# Patient Record
Sex: Female | Born: 1945
Health system: Southern US, Community
[De-identification: ages and names within clinical notes are randomized; demographics above are authoritative.]

## PROBLEM LIST (undated history)

## (undated) DIAGNOSIS — E7800 Pure hypercholesterolemia, unspecified: Secondary | ICD-10-CM

## (undated) DIAGNOSIS — E1122 Type 2 diabetes mellitus with diabetic chronic kidney disease: Secondary | ICD-10-CM

## (undated) DIAGNOSIS — N183 Chronic kidney disease, stage 3 unspecified: Secondary | ICD-10-CM

## (undated) DIAGNOSIS — M1990 Unspecified osteoarthritis, unspecified site: Secondary | ICD-10-CM

## (undated) DIAGNOSIS — K219 Gastro-esophageal reflux disease without esophagitis: Secondary | ICD-10-CM

## (undated) DIAGNOSIS — E119 Type 2 diabetes mellitus without complications: Secondary | ICD-10-CM

## (undated) DIAGNOSIS — I10 Essential (primary) hypertension: Secondary | ICD-10-CM

## (undated) HISTORY — DX: Type 2 diabetes mellitus without complications: E11.9

## (undated) HISTORY — DX: Essential (primary) hypertension: I10

## (undated) HISTORY — DX: Chronic kidney disease, stage 3 (moderate): N18.3

## (undated) HISTORY — DX: Pure hypercholesterolemia, unspecified: E78.00

## (undated) HISTORY — PX: EYE SURGERY: SHX253

## (undated) HISTORY — DX: Chronic kidney disease, stage 3 unspecified: N18.30

## (undated) HISTORY — DX: Type 2 diabetes mellitus with diabetic chronic kidney disease: E11.22

---

## 2003-12-19 ENCOUNTER — Encounter: Admission: RE | Admit: 2003-12-19 | Discharge: 2003-12-19 | Payer: Self-pay | Admitting: Internal Medicine

## 2005-09-28 ENCOUNTER — Encounter: Admission: RE | Admit: 2005-09-28 | Discharge: 2005-09-28 | Payer: Self-pay | Admitting: Internal Medicine

## 2005-10-20 ENCOUNTER — Encounter: Admission: RE | Admit: 2005-10-20 | Discharge: 2005-10-20 | Payer: Self-pay | Admitting: Gastroenterology

## 2005-11-25 ENCOUNTER — Ambulatory Visit (HOSPITAL_COMMUNITY): Admission: RE | Admit: 2005-11-25 | Discharge: 2005-11-25 | Payer: Self-pay | Admitting: Gastroenterology

## 2007-03-19 ENCOUNTER — Encounter: Admission: RE | Admit: 2007-03-19 | Discharge: 2007-03-19 | Payer: Self-pay | Admitting: Internal Medicine

## 2007-03-30 ENCOUNTER — Encounter: Admission: RE | Admit: 2007-03-30 | Discharge: 2007-03-30 | Payer: Self-pay | Admitting: Internal Medicine

## 2008-11-11 ENCOUNTER — Emergency Department (HOSPITAL_COMMUNITY): Admission: EM | Admit: 2008-11-11 | Discharge: 2008-11-11 | Payer: Self-pay | Admitting: Emergency Medicine

## 2009-04-03 ENCOUNTER — Encounter: Admission: RE | Admit: 2009-04-03 | Discharge: 2009-04-03 | Payer: Self-pay | Admitting: Internal Medicine

## 2010-08-30 LAB — DIFFERENTIAL
Basophils Relative: 1 % (ref 0–1)
Eosinophils Absolute: 0.1 10*3/uL (ref 0.0–0.7)
Eosinophils Relative: 1 % (ref 0–5)
Lymphocytes Relative: 33 % (ref 12–46)
Lymphs Abs: 1.8 10*3/uL (ref 0.7–4.0)
Monocytes Relative: 8 % (ref 3–12)
Neutro Abs: 3.2 10*3/uL (ref 1.7–7.7)

## 2010-08-30 LAB — COMPREHENSIVE METABOLIC PANEL
AST: 30 U/L (ref 0–37)
Albumin: 3.7 g/dL (ref 3.5–5.2)
Calcium: 9.6 mg/dL (ref 8.4–10.5)
Chloride: 104 mEq/L (ref 96–112)
Creatinine, Ser: 0.73 mg/dL (ref 0.4–1.2)
GFR calc non Af Amer: >60 mL/min (ref >60)
Potassium: 4.1 mEq/L (ref 3.5–5.1)
Sodium: 139 mEq/L (ref 135–145)
Total Bilirubin: 0.8 mg/dL (ref 0.3–1.2)

## 2010-08-30 LAB — CBC
Hemoglobin: 11.7 g/dL — ABNORMAL LOW (ref 12.0–15.0)
MCHC: 33.6 g/dL (ref 30.0–36.0)
MCV: 84.2 fL (ref 78.0–100.0)
RDW: 16.1 % — ABNORMAL HIGH (ref 11.5–15.5)

## 2010-08-30 LAB — CK TOTAL AND CKMB (NOT AT ARMC): Total CK: 524 U/L — ABNORMAL HIGH (ref 7–177)

## 2010-08-30 LAB — D-DIMER, QUANTITATIVE: D-Dimer, Quant: 0.51 ug/mL-FEU — ABNORMAL HIGH (ref 0.00–0.48)

## 2010-10-08 NOTE — Op Note (Signed)
NAME:  Nicole Nunez, Nicole Nunez             ACCOUNT NO.:  [id number]   MEDICAL RECORD NO.:  [id number]          PATIENT TYPE:  AMB   LOCATION:  ENDO                         FACILITY:  [facility]   PHYSICIAN:  Anselmo Rod, M.D.  DATE OF BIRTH:  12-Jun-1945   DATE OF PROCEDURE:  11/25/2005  DATE OF DISCHARGE:                                 OPERATIVE REPORT   PROCEDURE:  Screening colonoscopy.   ENDOSCOPIST:  Anselmo Rod, M.D.   INSTRUMENT USED:  Olympus videocolonoscope.   INDICATIONS FOR PROCEDURE:  The patient is a 65 year old African American  female undergoing screening colonoscopy to rule out colonic polyps, masses,  etc.  The patient has a history of mild anemia and suffers from  constipation.   PREPROCEDURE PREPARATION:  Informed consent was procured from the patient.  The patient was fasted for four hours prior to the procedure and prepped  with OsmoPrep pill the night of and the morning of the procedure.  The risks  and benefits of the procedure, including a 10% missed rate of cancer and  polyps, were discussed with the patient as well.   PREPROCEDURE PHYSICAL EXAMINATION:  VITAL SIGNS:  Stable.  NECK:  Supple.  CHEST:  Clear to auscultation.  S1 and S2 regular.  ABDOMEN:  Soft with normal bowel sounds.   DESCRIPTION OF PROCEDURE:  The patient was placed in the left lateral  decubitus position and sedated with 100 mcg of Fentanyl and 10 mg of Versed  in slow incremental doses.  Once the patient was adequately sedated and  maintained on low-flow oxygen and continuous cardiac monitoring, the Olympus  videocolonoscope was advanced from the rectum to the cecum.  The appendiceal  orifice and ileocecal valve were clearly visualized and photographed.  The  terminal ileum appeared healthy and without lesions.  There was some  residual stool in the colon.  Multiple washings were done.  No masses,  polyps, erosions, ulcerations, or diverticula were seen.  Retroflexion in  the rectum  revealed no abnormalities.   IMPRESSION:  Normal colonoscopy to the terminal ileum.  No masses, polyps,  or diverticula seen.   RECOMMENDATIONS:  1.Continue a high fiber diet with liberal fluid intake.  2.Repeat colonoscopy in the next 10 years unless the patient develops any  abnormal symptoms in the interim.  3.Outpatient followup as the need arises in the future.      Anselmo Rod, M.D.  Electronically Signed     JNM/MEDQ  D:  11/25/2005  T:  11/25/2005  Job:  [id number]   cc:   Candyce Churn. Allyne Gee, M.D.  Fax: [phone]

## 2011-11-25 ENCOUNTER — Other Ambulatory Visit: Payer: Self-pay | Admitting: Internal Medicine

## 2011-11-25 ENCOUNTER — Ambulatory Visit
Admission: RE | Admit: 2011-11-25 | Discharge: 2011-11-25 | Disposition: A | Discharge status: Home / Self Care | Payer: 59 | Source: Ambulatory Visit | Attending: Internal Medicine | Admitting: Internal Medicine

## 2011-11-25 DIAGNOSIS — R52 Pain, unspecified: Secondary | ICD-10-CM

## 2013-08-12 ENCOUNTER — Other Ambulatory Visit: Payer: Self-pay

## 2013-08-12 DIAGNOSIS — Z1231 Encounter for screening mammogram for malignant neoplasm of breast: Secondary | ICD-10-CM

## 2013-08-20 ENCOUNTER — Other Ambulatory Visit: Payer: Self-pay

## 2013-08-20 ENCOUNTER — Other Ambulatory Visit: Payer: Self-pay | Admitting: Internal Medicine

## 2013-08-20 DIAGNOSIS — N644 Mastodynia: Secondary | ICD-10-CM

## 2013-08-21 ENCOUNTER — Ambulatory Visit: Payer: 59

## 2013-08-27 ENCOUNTER — Ambulatory Visit
Admission: RE | Admit: 2013-08-27 | Discharge: 2013-08-27 | Disposition: A | Discharge status: Home / Self Care | Payer: 59 | Source: Ambulatory Visit

## 2013-08-27 ENCOUNTER — Ambulatory Visit
Admission: RE | Admit: 2013-08-27 | Discharge: 2013-08-27 | Disposition: A | Discharge status: Home / Self Care | Payer: Self-pay | Source: Ambulatory Visit

## 2013-08-27 DIAGNOSIS — N644 Mastodynia: Secondary | ICD-10-CM

## 2014-07-21 ENCOUNTER — Other Ambulatory Visit: Payer: Self-pay

## 2014-07-21 DIAGNOSIS — Z1231 Encounter for screening mammogram for malignant neoplasm of breast: Secondary | ICD-10-CM

## 2014-09-01 ENCOUNTER — Ambulatory Visit
Admission: RE | Admit: 2014-09-01 | Discharge: 2014-09-01 | Disposition: A | Discharge status: Home / Self Care | Payer: Medicare Other | Source: Ambulatory Visit

## 2014-09-01 DIAGNOSIS — Z1231 Encounter for screening mammogram for malignant neoplasm of breast: Secondary | ICD-10-CM

## 2014-10-06 ENCOUNTER — Other Ambulatory Visit (HOSPITAL_COMMUNITY): Payer: Self-pay | Admitting: Internal Medicine

## 2014-10-06 DIAGNOSIS — R1314 Dysphagia, pharyngoesophageal phase: Secondary | ICD-10-CM

## 2014-10-09 ENCOUNTER — Ambulatory Visit (HOSPITAL_COMMUNITY): Admission: RE | Admit: 2014-10-09 | Payer: Medicare Other | Source: Ambulatory Visit

## 2014-12-12 ENCOUNTER — Other Ambulatory Visit: Payer: Self-pay | Admitting: Internal Medicine

## 2014-12-12 DIAGNOSIS — E049 Nontoxic goiter, unspecified: Secondary | ICD-10-CM

## 2014-12-19 ENCOUNTER — Ambulatory Visit
Admission: RE | Admit: 2014-12-19 | Discharge: 2014-12-19 | Disposition: A | Discharge status: Home / Self Care | Payer: Medicare Other | Source: Ambulatory Visit | Attending: Internal Medicine | Admitting: Internal Medicine

## 2014-12-19 DIAGNOSIS — E049 Nontoxic goiter, unspecified: Secondary | ICD-10-CM

## 2015-06-24 DIAGNOSIS — N08 Glomerular disorders in diseases classified elsewhere: Secondary | ICD-10-CM | POA: Diagnosis not present

## 2015-06-24 DIAGNOSIS — E1122 Type 2 diabetes mellitus with diabetic chronic kidney disease: Secondary | ICD-10-CM | POA: Diagnosis not present

## 2015-06-24 DIAGNOSIS — I129 Hypertensive chronic kidney disease with stage 1 through stage 4 chronic kidney disease, or unspecified chronic kidney disease: Secondary | ICD-10-CM | POA: Diagnosis not present

## 2015-06-24 DIAGNOSIS — N182 Chronic kidney disease, stage 2 (mild): Secondary | ICD-10-CM | POA: Diagnosis not present

## 2015-07-29 DIAGNOSIS — L602 Onychogryphosis: Secondary | ICD-10-CM | POA: Diagnosis not present

## 2015-07-29 DIAGNOSIS — E1351 Other specified diabetes mellitus with diabetic peripheral angiopathy without gangrene: Secondary | ICD-10-CM | POA: Diagnosis not present

## 2015-10-20 ENCOUNTER — Other Ambulatory Visit: Payer: Self-pay

## 2015-10-20 DIAGNOSIS — Z1231 Encounter for screening mammogram for malignant neoplasm of breast: Secondary | ICD-10-CM

## 2015-10-22 ENCOUNTER — Ambulatory Visit
Admission: RE | Admit: 2015-10-22 | Discharge: 2015-10-22 | Disposition: A | Discharge status: Home / Self Care | Payer: Medicare Other | Source: Ambulatory Visit

## 2015-10-22 DIAGNOSIS — Z1231 Encounter for screening mammogram for malignant neoplasm of breast: Secondary | ICD-10-CM

## 2015-10-22 DIAGNOSIS — I129 Hypertensive chronic kidney disease with stage 1 through stage 4 chronic kidney disease, or unspecified chronic kidney disease: Secondary | ICD-10-CM | POA: Diagnosis not present

## 2015-10-22 DIAGNOSIS — D649 Anemia, unspecified: Secondary | ICD-10-CM | POA: Diagnosis not present

## 2015-10-22 DIAGNOSIS — Z0000 Encounter for general adult medical examination without abnormal findings: Secondary | ICD-10-CM | POA: Diagnosis not present

## 2015-10-22 DIAGNOSIS — N182 Chronic kidney disease, stage 2 (mild): Secondary | ICD-10-CM | POA: Diagnosis not present

## 2015-10-22 DIAGNOSIS — E1122 Type 2 diabetes mellitus with diabetic chronic kidney disease: Secondary | ICD-10-CM | POA: Diagnosis not present

## 2015-10-22 DIAGNOSIS — E559 Vitamin D deficiency, unspecified: Secondary | ICD-10-CM | POA: Diagnosis not present

## 2015-10-22 DIAGNOSIS — N08 Glomerular disorders in diseases classified elsewhere: Secondary | ICD-10-CM | POA: Diagnosis not present

## 2015-11-11 DIAGNOSIS — L84 Corns and callosities: Secondary | ICD-10-CM | POA: Diagnosis not present

## 2015-11-11 DIAGNOSIS — E1351 Other specified diabetes mellitus with diabetic peripheral angiopathy without gangrene: Secondary | ICD-10-CM | POA: Diagnosis not present

## 2015-11-11 DIAGNOSIS — L602 Onychogryphosis: Secondary | ICD-10-CM | POA: Diagnosis not present

## 2015-11-12 DIAGNOSIS — K219 Gastro-esophageal reflux disease without esophagitis: Secondary | ICD-10-CM | POA: Diagnosis not present

## 2015-11-12 DIAGNOSIS — Z1211 Encounter for screening for malignant neoplasm of colon: Secondary | ICD-10-CM | POA: Diagnosis not present

## 2015-11-16 DIAGNOSIS — Z79899 Other long term (current) drug therapy: Secondary | ICD-10-CM | POA: Diagnosis not present

## 2015-12-18 DIAGNOSIS — K635 Polyp of colon: Secondary | ICD-10-CM | POA: Diagnosis not present

## 2015-12-18 DIAGNOSIS — Z5181 Encounter for therapeutic drug level monitoring: Secondary | ICD-10-CM | POA: Diagnosis not present

## 2015-12-18 DIAGNOSIS — K5730 Diverticulosis of large intestine without perforation or abscess without bleeding: Secondary | ICD-10-CM | POA: Diagnosis not present

## 2015-12-18 DIAGNOSIS — Z1211 Encounter for screening for malignant neoplasm of colon: Secondary | ICD-10-CM | POA: Diagnosis not present

## 2015-12-18 DIAGNOSIS — K5140 Inflammatory polyps of colon without complications: Secondary | ICD-10-CM | POA: Diagnosis not present

## 2015-12-18 DIAGNOSIS — D127 Benign neoplasm of rectosigmoid junction: Secondary | ICD-10-CM | POA: Diagnosis not present

## 2015-12-18 LAB — HM COLONOSCOPY

## 2016-01-21 DIAGNOSIS — H2513 Age-related nuclear cataract, bilateral: Secondary | ICD-10-CM | POA: Diagnosis not present

## 2016-01-21 DIAGNOSIS — H43393 Other vitreous opacities, bilateral: Secondary | ICD-10-CM | POA: Diagnosis not present

## 2016-01-21 DIAGNOSIS — E113211 Type 2 diabetes mellitus with mild nonproliferative diabetic retinopathy with macular edema, right eye: Secondary | ICD-10-CM | POA: Diagnosis not present

## 2016-01-21 DIAGNOSIS — E113292 Type 2 diabetes mellitus with mild nonproliferative diabetic retinopathy without macular edema, left eye: Secondary | ICD-10-CM | POA: Diagnosis not present

## 2016-01-27 DIAGNOSIS — N183 Chronic kidney disease, stage 3 (moderate): Secondary | ICD-10-CM | POA: Diagnosis not present

## 2016-01-27 DIAGNOSIS — E1121 Type 2 diabetes mellitus with diabetic nephropathy: Secondary | ICD-10-CM | POA: Diagnosis not present

## 2016-01-27 DIAGNOSIS — I129 Hypertensive chronic kidney disease with stage 1 through stage 4 chronic kidney disease, or unspecified chronic kidney disease: Secondary | ICD-10-CM | POA: Diagnosis not present

## 2016-01-27 DIAGNOSIS — N182 Chronic kidney disease, stage 2 (mild): Secondary | ICD-10-CM | POA: Diagnosis not present

## 2016-01-27 DIAGNOSIS — M810 Age-related osteoporosis without current pathological fracture: Secondary | ICD-10-CM | POA: Diagnosis not present

## 2016-01-28 ENCOUNTER — Other Ambulatory Visit: Payer: Self-pay | Admitting: Nephrology

## 2016-01-28 DIAGNOSIS — N183 Chronic kidney disease, stage 3 unspecified: Secondary | ICD-10-CM

## 2016-02-01 ENCOUNTER — Other Ambulatory Visit: Payer: Medicare Other

## 2016-02-10 DIAGNOSIS — L84 Corns and callosities: Secondary | ICD-10-CM | POA: Diagnosis not present

## 2016-02-10 DIAGNOSIS — E1351 Other specified diabetes mellitus with diabetic peripheral angiopathy without gangrene: Secondary | ICD-10-CM | POA: Diagnosis not present

## 2016-02-10 DIAGNOSIS — L602 Onychogryphosis: Secondary | ICD-10-CM | POA: Diagnosis not present

## 2016-02-16 ENCOUNTER — Ambulatory Visit
Admission: RE | Admit: 2016-02-16 | Discharge: 2016-02-16 | Disposition: A | Discharge status: Home / Self Care | Payer: Medicare Other | Source: Ambulatory Visit | Attending: Nephrology | Admitting: Nephrology

## 2016-02-16 DIAGNOSIS — N183 Chronic kidney disease, stage 3 unspecified: Secondary | ICD-10-CM

## 2016-02-16 DIAGNOSIS — N189 Chronic kidney disease, unspecified: Secondary | ICD-10-CM | POA: Diagnosis not present

## 2016-02-29 DIAGNOSIS — N183 Chronic kidney disease, stage 3 (moderate): Secondary | ICD-10-CM | POA: Diagnosis not present

## 2016-02-29 DIAGNOSIS — Z23 Encounter for immunization: Secondary | ICD-10-CM | POA: Diagnosis not present

## 2016-02-29 DIAGNOSIS — N08 Glomerular disorders in diseases classified elsewhere: Secondary | ICD-10-CM | POA: Diagnosis not present

## 2016-02-29 DIAGNOSIS — E1122 Type 2 diabetes mellitus with diabetic chronic kidney disease: Secondary | ICD-10-CM | POA: Diagnosis not present

## 2016-02-29 DIAGNOSIS — I129 Hypertensive chronic kidney disease with stage 1 through stage 4 chronic kidney disease, or unspecified chronic kidney disease: Secondary | ICD-10-CM | POA: Diagnosis not present

## 2016-04-11 DIAGNOSIS — M810 Age-related osteoporosis without current pathological fracture: Secondary | ICD-10-CM | POA: Diagnosis not present

## 2016-05-11 DIAGNOSIS — L84 Corns and callosities: Secondary | ICD-10-CM | POA: Diagnosis not present

## 2016-05-11 DIAGNOSIS — E1151 Type 2 diabetes mellitus with diabetic peripheral angiopathy without gangrene: Secondary | ICD-10-CM | POA: Diagnosis not present

## 2016-05-11 DIAGNOSIS — L602 Onychogryphosis: Secondary | ICD-10-CM | POA: Diagnosis not present

## 2016-05-30 DIAGNOSIS — N08 Glomerular disorders in diseases classified elsewhere: Secondary | ICD-10-CM | POA: Diagnosis not present

## 2016-05-30 DIAGNOSIS — N183 Chronic kidney disease, stage 3 (moderate): Secondary | ICD-10-CM | POA: Diagnosis not present

## 2016-05-30 DIAGNOSIS — E1122 Type 2 diabetes mellitus with diabetic chronic kidney disease: Secondary | ICD-10-CM | POA: Diagnosis not present

## 2016-05-30 DIAGNOSIS — J320 Chronic maxillary sinusitis: Secondary | ICD-10-CM | POA: Diagnosis not present

## 2016-08-15 DIAGNOSIS — H25813 Combined forms of age-related cataract, bilateral: Secondary | ICD-10-CM | POA: Diagnosis not present

## 2016-08-15 DIAGNOSIS — H5203 Hypermetropia, bilateral: Secondary | ICD-10-CM | POA: Diagnosis not present

## 2016-08-15 DIAGNOSIS — H52203 Unspecified astigmatism, bilateral: Secondary | ICD-10-CM | POA: Diagnosis not present

## 2016-08-15 DIAGNOSIS — H524 Presbyopia: Secondary | ICD-10-CM | POA: Diagnosis not present

## 2016-08-24 DIAGNOSIS — E1122 Type 2 diabetes mellitus with diabetic chronic kidney disease: Secondary | ICD-10-CM | POA: Diagnosis not present

## 2016-08-24 DIAGNOSIS — N08 Glomerular disorders in diseases classified elsewhere: Secondary | ICD-10-CM | POA: Diagnosis not present

## 2016-08-24 DIAGNOSIS — N183 Chronic kidney disease, stage 3 (moderate): Secondary | ICD-10-CM | POA: Diagnosis not present

## 2016-08-24 DIAGNOSIS — R202 Paresthesia of skin: Secondary | ICD-10-CM | POA: Diagnosis not present

## 2016-08-24 DIAGNOSIS — I129 Hypertensive chronic kidney disease with stage 1 through stage 4 chronic kidney disease, or unspecified chronic kidney disease: Secondary | ICD-10-CM | POA: Diagnosis not present

## 2016-08-26 DIAGNOSIS — I70293 Other atherosclerosis of native arteries of extremities, bilateral legs: Secondary | ICD-10-CM | POA: Diagnosis not present

## 2016-08-26 DIAGNOSIS — L602 Onychogryphosis: Secondary | ICD-10-CM | POA: Diagnosis not present

## 2016-08-26 DIAGNOSIS — L84 Corns and callosities: Secondary | ICD-10-CM | POA: Diagnosis not present

## 2016-08-26 DIAGNOSIS — E1351 Other specified diabetes mellitus with diabetic peripheral angiopathy without gangrene: Secondary | ICD-10-CM | POA: Diagnosis not present

## 2016-10-13 DIAGNOSIS — H2513 Age-related nuclear cataract, bilateral: Secondary | ICD-10-CM | POA: Diagnosis not present

## 2016-10-13 DIAGNOSIS — H43393 Other vitreous opacities, bilateral: Secondary | ICD-10-CM | POA: Diagnosis not present

## 2016-10-13 DIAGNOSIS — E113292 Type 2 diabetes mellitus with mild nonproliferative diabetic retinopathy without macular edema, left eye: Secondary | ICD-10-CM | POA: Diagnosis not present

## 2016-10-13 DIAGNOSIS — E113211 Type 2 diabetes mellitus with mild nonproliferative diabetic retinopathy with macular edema, right eye: Secondary | ICD-10-CM | POA: Diagnosis not present

## 2016-12-06 ENCOUNTER — Other Ambulatory Visit: Payer: Self-pay | Admitting: Internal Medicine

## 2016-12-06 DIAGNOSIS — Z1231 Encounter for screening mammogram for malignant neoplasm of breast: Secondary | ICD-10-CM

## 2016-12-07 ENCOUNTER — Ambulatory Visit
Admission: RE | Admit: 2016-12-07 | Discharge: 2016-12-07 | Disposition: A | Discharge status: Home / Self Care | Payer: Medicare Other | Source: Ambulatory Visit | Attending: Internal Medicine | Admitting: Internal Medicine

## 2016-12-07 DIAGNOSIS — Z1231 Encounter for screening mammogram for malignant neoplasm of breast: Secondary | ICD-10-CM

## 2016-12-07 DIAGNOSIS — Z0000 Encounter for general adult medical examination without abnormal findings: Secondary | ICD-10-CM | POA: Diagnosis not present

## 2016-12-07 DIAGNOSIS — E1122 Type 2 diabetes mellitus with diabetic chronic kidney disease: Secondary | ICD-10-CM | POA: Diagnosis not present

## 2016-12-07 DIAGNOSIS — N08 Glomerular disorders in diseases classified elsewhere: Secondary | ICD-10-CM | POA: Diagnosis not present

## 2016-12-07 DIAGNOSIS — N183 Chronic kidney disease, stage 3 (moderate): Secondary | ICD-10-CM | POA: Diagnosis not present

## 2016-12-07 DIAGNOSIS — I129 Hypertensive chronic kidney disease with stage 1 through stage 4 chronic kidney disease, or unspecified chronic kidney disease: Secondary | ICD-10-CM | POA: Diagnosis not present

## 2016-12-07 DIAGNOSIS — E559 Vitamin D deficiency, unspecified: Secondary | ICD-10-CM | POA: Diagnosis not present

## 2016-12-09 DIAGNOSIS — L84 Corns and callosities: Secondary | ICD-10-CM | POA: Diagnosis not present

## 2016-12-09 DIAGNOSIS — E1351 Other specified diabetes mellitus with diabetic peripheral angiopathy without gangrene: Secondary | ICD-10-CM | POA: Diagnosis not present

## 2016-12-09 DIAGNOSIS — L602 Onychogryphosis: Secondary | ICD-10-CM | POA: Diagnosis not present

## 2017-02-20 DIAGNOSIS — E119 Type 2 diabetes mellitus without complications: Secondary | ICD-10-CM | POA: Diagnosis not present

## 2017-02-20 DIAGNOSIS — Z23 Encounter for immunization: Secondary | ICD-10-CM | POA: Diagnosis not present

## 2017-02-20 DIAGNOSIS — N183 Chronic kidney disease, stage 3 (moderate): Secondary | ICD-10-CM | POA: Diagnosis not present

## 2017-02-20 DIAGNOSIS — I129 Hypertensive chronic kidney disease with stage 1 through stage 4 chronic kidney disease, or unspecified chronic kidney disease: Secondary | ICD-10-CM | POA: Diagnosis not present

## 2017-03-16 DIAGNOSIS — L602 Onychogryphosis: Secondary | ICD-10-CM | POA: Diagnosis not present

## 2017-03-16 DIAGNOSIS — E1351 Other specified diabetes mellitus with diabetic peripheral angiopathy without gangrene: Secondary | ICD-10-CM | POA: Diagnosis not present

## 2017-03-16 DIAGNOSIS — L84 Corns and callosities: Secondary | ICD-10-CM | POA: Diagnosis not present

## 2017-04-12 DIAGNOSIS — E1122 Type 2 diabetes mellitus with diabetic chronic kidney disease: Secondary | ICD-10-CM | POA: Diagnosis not present

## 2017-04-12 DIAGNOSIS — N183 Chronic kidney disease, stage 3 (moderate): Secondary | ICD-10-CM | POA: Diagnosis not present

## 2017-04-12 DIAGNOSIS — I129 Hypertensive chronic kidney disease with stage 1 through stage 4 chronic kidney disease, or unspecified chronic kidney disease: Secondary | ICD-10-CM | POA: Diagnosis not present

## 2017-04-12 DIAGNOSIS — N08 Glomerular disorders in diseases classified elsewhere: Secondary | ICD-10-CM | POA: Diagnosis not present

## 2017-05-19 DIAGNOSIS — M2041 Other hammer toe(s) (acquired), right foot: Secondary | ICD-10-CM | POA: Diagnosis not present

## 2017-05-19 DIAGNOSIS — M2042 Other hammer toe(s) (acquired), left foot: Secondary | ICD-10-CM | POA: Diagnosis not present

## 2017-05-19 DIAGNOSIS — L84 Corns and callosities: Secondary | ICD-10-CM | POA: Diagnosis not present

## 2017-05-19 DIAGNOSIS — E119 Type 2 diabetes mellitus without complications: Secondary | ICD-10-CM | POA: Diagnosis not present

## 2017-06-19 DIAGNOSIS — L84 Corns and callosities: Secondary | ICD-10-CM | POA: Diagnosis not present

## 2017-06-19 DIAGNOSIS — M2041 Other hammer toe(s) (acquired), right foot: Secondary | ICD-10-CM | POA: Diagnosis not present

## 2017-06-19 DIAGNOSIS — B351 Tinea unguium: Secondary | ICD-10-CM | POA: Diagnosis not present

## 2017-06-19 DIAGNOSIS — M2042 Other hammer toe(s) (acquired), left foot: Secondary | ICD-10-CM | POA: Diagnosis not present

## 2017-06-19 DIAGNOSIS — E119 Type 2 diabetes mellitus without complications: Secondary | ICD-10-CM | POA: Diagnosis not present

## 2017-06-23 HISTORY — PX: OTHER SURGICAL HISTORY: SHX169

## 2017-07-05 DIAGNOSIS — B351 Tinea unguium: Secondary | ICD-10-CM | POA: Diagnosis not present

## 2017-07-05 DIAGNOSIS — M2041 Other hammer toe(s) (acquired), right foot: Secondary | ICD-10-CM | POA: Diagnosis not present

## 2017-07-05 DIAGNOSIS — M2042 Other hammer toe(s) (acquired), left foot: Secondary | ICD-10-CM | POA: Diagnosis not present

## 2017-07-07 DIAGNOSIS — Z4789 Encounter for other orthopedic aftercare: Secondary | ICD-10-CM | POA: Diagnosis not present

## 2017-07-07 DIAGNOSIS — Z4801 Encounter for change or removal of surgical wound dressing: Secondary | ICD-10-CM | POA: Diagnosis not present

## 2017-07-07 DIAGNOSIS — L84 Corns and callosities: Secondary | ICD-10-CM | POA: Diagnosis not present

## 2017-07-07 DIAGNOSIS — I10 Essential (primary) hypertension: Secondary | ICD-10-CM | POA: Diagnosis not present

## 2017-07-07 DIAGNOSIS — E119 Type 2 diabetes mellitus without complications: Secondary | ICD-10-CM | POA: Diagnosis not present

## 2017-07-07 DIAGNOSIS — B351 Tinea unguium: Secondary | ICD-10-CM | POA: Diagnosis not present

## 2017-07-11 DIAGNOSIS — Z4789 Encounter for other orthopedic aftercare: Secondary | ICD-10-CM | POA: Diagnosis not present

## 2017-07-11 DIAGNOSIS — L84 Corns and callosities: Secondary | ICD-10-CM | POA: Diagnosis not present

## 2017-07-11 DIAGNOSIS — B351 Tinea unguium: Secondary | ICD-10-CM | POA: Diagnosis not present

## 2017-07-11 DIAGNOSIS — I10 Essential (primary) hypertension: Secondary | ICD-10-CM | POA: Diagnosis not present

## 2017-07-11 DIAGNOSIS — Z4801 Encounter for change or removal of surgical wound dressing: Secondary | ICD-10-CM | POA: Diagnosis not present

## 2017-07-11 DIAGNOSIS — E119 Type 2 diabetes mellitus without complications: Secondary | ICD-10-CM | POA: Diagnosis not present

## 2017-07-14 DIAGNOSIS — I10 Essential (primary) hypertension: Secondary | ICD-10-CM | POA: Diagnosis not present

## 2017-07-14 DIAGNOSIS — E119 Type 2 diabetes mellitus without complications: Secondary | ICD-10-CM | POA: Diagnosis not present

## 2017-07-14 DIAGNOSIS — Z4789 Encounter for other orthopedic aftercare: Secondary | ICD-10-CM | POA: Diagnosis not present

## 2017-07-14 DIAGNOSIS — L84 Corns and callosities: Secondary | ICD-10-CM | POA: Diagnosis not present

## 2017-07-14 DIAGNOSIS — Z4801 Encounter for change or removal of surgical wound dressing: Secondary | ICD-10-CM | POA: Diagnosis not present

## 2017-07-14 DIAGNOSIS — B351 Tinea unguium: Secondary | ICD-10-CM | POA: Diagnosis not present

## 2017-07-17 DIAGNOSIS — L84 Corns and callosities: Secondary | ICD-10-CM | POA: Diagnosis not present

## 2017-07-17 DIAGNOSIS — Z4789 Encounter for other orthopedic aftercare: Secondary | ICD-10-CM | POA: Diagnosis not present

## 2017-07-17 DIAGNOSIS — Z4801 Encounter for change or removal of surgical wound dressing: Secondary | ICD-10-CM | POA: Diagnosis not present

## 2017-07-17 DIAGNOSIS — I10 Essential (primary) hypertension: Secondary | ICD-10-CM | POA: Diagnosis not present

## 2017-07-17 DIAGNOSIS — B351 Tinea unguium: Secondary | ICD-10-CM | POA: Diagnosis not present

## 2017-07-17 DIAGNOSIS — E119 Type 2 diabetes mellitus without complications: Secondary | ICD-10-CM | POA: Diagnosis not present

## 2017-07-20 DIAGNOSIS — L84 Corns and callosities: Secondary | ICD-10-CM | POA: Diagnosis not present

## 2017-07-20 DIAGNOSIS — B351 Tinea unguium: Secondary | ICD-10-CM | POA: Diagnosis not present

## 2017-07-20 DIAGNOSIS — I10 Essential (primary) hypertension: Secondary | ICD-10-CM | POA: Diagnosis not present

## 2017-07-20 DIAGNOSIS — E119 Type 2 diabetes mellitus without complications: Secondary | ICD-10-CM | POA: Diagnosis not present

## 2017-07-20 DIAGNOSIS — Z4801 Encounter for change or removal of surgical wound dressing: Secondary | ICD-10-CM | POA: Diagnosis not present

## 2017-07-20 DIAGNOSIS — Z4789 Encounter for other orthopedic aftercare: Secondary | ICD-10-CM | POA: Diagnosis not present

## 2017-07-21 DIAGNOSIS — L84 Corns and callosities: Secondary | ICD-10-CM | POA: Diagnosis not present

## 2017-07-21 DIAGNOSIS — E119 Type 2 diabetes mellitus without complications: Secondary | ICD-10-CM | POA: Diagnosis not present

## 2017-07-21 DIAGNOSIS — Z4789 Encounter for other orthopedic aftercare: Secondary | ICD-10-CM | POA: Diagnosis not present

## 2017-07-21 DIAGNOSIS — B351 Tinea unguium: Secondary | ICD-10-CM | POA: Diagnosis not present

## 2017-07-21 DIAGNOSIS — Z4801 Encounter for change or removal of surgical wound dressing: Secondary | ICD-10-CM | POA: Diagnosis not present

## 2017-07-21 DIAGNOSIS — I10 Essential (primary) hypertension: Secondary | ICD-10-CM | POA: Diagnosis not present

## 2017-07-25 DIAGNOSIS — Z4801 Encounter for change or removal of surgical wound dressing: Secondary | ICD-10-CM | POA: Diagnosis not present

## 2017-07-25 DIAGNOSIS — L84 Corns and callosities: Secondary | ICD-10-CM | POA: Diagnosis not present

## 2017-07-25 DIAGNOSIS — I10 Essential (primary) hypertension: Secondary | ICD-10-CM | POA: Diagnosis not present

## 2017-07-25 DIAGNOSIS — B351 Tinea unguium: Secondary | ICD-10-CM | POA: Diagnosis not present

## 2017-07-25 DIAGNOSIS — Z4789 Encounter for other orthopedic aftercare: Secondary | ICD-10-CM | POA: Diagnosis not present

## 2017-07-25 DIAGNOSIS — E119 Type 2 diabetes mellitus without complications: Secondary | ICD-10-CM | POA: Diagnosis not present

## 2017-08-08 DIAGNOSIS — I129 Hypertensive chronic kidney disease with stage 1 through stage 4 chronic kidney disease, or unspecified chronic kidney disease: Secondary | ICD-10-CM | POA: Diagnosis not present

## 2017-08-08 DIAGNOSIS — N183 Chronic kidney disease, stage 3 (moderate): Secondary | ICD-10-CM | POA: Diagnosis not present

## 2017-08-08 DIAGNOSIS — E1122 Type 2 diabetes mellitus with diabetic chronic kidney disease: Secondary | ICD-10-CM | POA: Diagnosis not present

## 2017-08-08 DIAGNOSIS — N08 Glomerular disorders in diseases classified elsewhere: Secondary | ICD-10-CM | POA: Diagnosis not present

## 2017-08-31 DIAGNOSIS — H43393 Other vitreous opacities, bilateral: Secondary | ICD-10-CM | POA: Diagnosis not present

## 2017-08-31 DIAGNOSIS — H2513 Age-related nuclear cataract, bilateral: Secondary | ICD-10-CM | POA: Diagnosis not present

## 2017-08-31 DIAGNOSIS — E113211 Type 2 diabetes mellitus with mild nonproliferative diabetic retinopathy with macular edema, right eye: Secondary | ICD-10-CM | POA: Diagnosis not present

## 2017-08-31 DIAGNOSIS — E113292 Type 2 diabetes mellitus with mild nonproliferative diabetic retinopathy without macular edema, left eye: Secondary | ICD-10-CM | POA: Diagnosis not present

## 2017-11-14 DIAGNOSIS — N08 Glomerular disorders in diseases classified elsewhere: Secondary | ICD-10-CM | POA: Diagnosis not present

## 2017-11-14 DIAGNOSIS — N183 Chronic kidney disease, stage 3 (moderate): Secondary | ICD-10-CM | POA: Diagnosis not present

## 2017-11-14 DIAGNOSIS — I129 Hypertensive chronic kidney disease with stage 1 through stage 4 chronic kidney disease, or unspecified chronic kidney disease: Secondary | ICD-10-CM | POA: Diagnosis not present

## 2017-11-14 DIAGNOSIS — E1122 Type 2 diabetes mellitus with diabetic chronic kidney disease: Secondary | ICD-10-CM | POA: Diagnosis not present

## 2017-12-06 ENCOUNTER — Encounter: Payer: Self-pay | Admitting: *Deleted

## 2017-12-06 ENCOUNTER — Other Ambulatory Visit: Payer: Self-pay | Admitting: *Deleted

## 2017-12-06 NOTE — Patient Outreach (Signed)
[place] [facility]) Care Management  12/06/2017  Nicole Nunez 25-Jul-1945 [id number]  Referral via MD office; Reason: Patient in doughnut hole; needs assistance with medication-Ozempic, Livalo  Telephone call #1; left message on voice mail requesting call back.  Plan: Send outreach letter Follow up 2-4 business days.  Sherrin Daisy, RN BSN [place] Management Coordinator [facility] Care Management  [phone]

## 2017-12-12 ENCOUNTER — Other Ambulatory Visit: Payer: Self-pay | Admitting: *Deleted

## 2017-12-12 NOTE — Patient Outreach (Signed)
[place] [facility]) Care Management  12/12/2017  SUANNE MINAHAN January 03, 1946 [id number]  Referral via MD office; Reason: Patient in doughnut hole; needs assistance with medication-Ozempic, Livalo  Telephone call #2 to patient; left HIPPA compliant voice mail requesting call back.  Plan; Follow up in 2-4 business days. Outreach was sent 7/17.  Sherrin Daisy, RN BSN [place] Management Coordinator [facility] Care Management  [phone]

## 2017-12-14 ENCOUNTER — Other Ambulatory Visit: Payer: Self-pay | Admitting: Internal Medicine

## 2017-12-14 DIAGNOSIS — Z1231 Encounter for screening mammogram for malignant neoplasm of breast: Secondary | ICD-10-CM

## 2017-12-18 ENCOUNTER — Other Ambulatory Visit: Payer: Self-pay | Admitting: *Deleted

## 2017-12-18 NOTE — Patient Outreach (Signed)
[place] [facility]) Care Management  12/18/2017  Nicole Nunez 01-31-1946 [id number]  Referral via MD office; Reason: Patient in doughnut hole; needs assistance with medication-Ozempic, Livalo  Telephone call #3; returned call to patient after receiving voice message. Return call to patient; left voice message requesting return call.  Plan: Will follow up 2-4 business days.   Sherrin Daisy, RN BSN [place] Management Coordinator [facility] Care Management  [phone]

## 2017-12-20 ENCOUNTER — Other Ambulatory Visit: Payer: Self-pay | Admitting: *Deleted

## 2017-12-20 NOTE — Patient Outreach (Signed)
[place] [facility]) Care Management  12/20/2017  Nicole Nunez Dec 24, 1945 [id number]   Referral via MD office; Reason: Patient in doughnut hole; needs assistance with medication-Ozempic, Livalo  Received return call from patient via voice mail. Telephone call to patient; left voicemail requesting return call.   Plan:  Will follow up.  Sherrin Daisy, RN BSN [place] Management Coordinator [facility] Care Management  [phone]

## 2017-12-21 ENCOUNTER — Other Ambulatory Visit: Payer: Self-pay | Admitting: *Deleted

## 2017-12-21 ENCOUNTER — Other Ambulatory Visit: Payer: Self-pay | Admitting: Pharmacist

## 2017-12-21 ENCOUNTER — Encounter: Payer: Self-pay | Admitting: *Deleted

## 2017-12-21 NOTE — Patient Outreach (Signed)
[place] [facility]) Care Management  12/21/2017  REGINIA BATTIE [birth year] [id number]  Referral via MD office: Reason: Patient in doughnut hole; needs assistance with medication-Ozempic, Livalo  Incoming call from patient; HIPPA verification received. Patient advised of [facility] care management services.  Patient voices that she has been in donut hole since March and would not be able to afford  Ozempic or Livalo if she could not get samples from MD office. States she is taking Ozempic once weekly and Liv[place] 38m daily. States the cost of one Ozempic pen is $200.00.  States she currently has all of the medications that has been prescribed for her and she is taking as ordered.  Patient states last HgA1c was 6.7 and goal is below 7. States she exercises 4-5 times weekly and takes Tai-Chi. States she is very busy during the day. Voices she knows what to eat and tries to adher to diabetic diet. 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  Patient was advised of [facility]. Patient voices she does not need health coaching at this time.  Recommendation for referral to Pharmacist for medication assistance. [facility].  Plan: Referral to Pharmacist for medication management. Telephonic care coordinator signing off.   LSherrin Daisy RN BSN [place] Coordinator [facility]  [phone]

## 2017-12-21 NOTE — Patient Outreach (Signed)
[facility]) Care Management  12/21/2017  Nicole Nunez [birth year] [id number]  72 year old female referred to [place] Management by Dr. Baird Cancer for medication assistance with Ozempic and Livalo.   Per notes, patient is in the coverage gap and has been receiving samples of medications.   Unsuccessful call to Nicole Nunez on home / mobile phone (same # listed for both).  I left a HIPAA compliant voicemail requesting a return call.   Plan: I will send patient a letter describing [facility] services and will call her again next week.   Ralene Bathe, PharmD, [place] [phone]

## 2017-12-27 ENCOUNTER — Other Ambulatory Visit: Payer: Self-pay | Admitting: Pharmacist

## 2017-12-27 NOTE — Patient Outreach (Signed)
[place] [facility]) Care Management  12/27/2017  Nicole Nunez 06/25/1945 [id number]   72 year old female referred to [place] Management by Dr. Baird Cancer for medication assistance with Ozempic and Livalo.   Per notes, patient is in the coverage gap and has been receiving samples of medications.   Successful call to [name] today.  HIPAA identifiers verified.   Insurance: [place]  Patient reports she is in the coverage gap right now and co-pays for Livalo + Ozempic have increased from $45 to $200.  Patient reports she has had side effects from all other statins and can only tolerate Livalo.  She was switched from Victoza to [place] a few months ago and feels that Ozempic has worked well for her.  She reports she has been working to add exercise and diet changes to her lifestyle.  She reports her last A1C = 6.7 and will be checked again this fall.  Medication Assistance:  Extra Help: Not eligible based on reported income  Patient Assistance Programs:  -Livalo: Not eligible based on income criteria -Ozempic: Has not met enough TROOP ($1000) yet to qualify for program.  Does not qualify for other GLP-1 agonists either.    We reviewed programs in detail and patient voiced understanding on eligibility criteria.  She has discussed these programs in the past with other pharmacists.  I provided my phone number should patient like to reach out to me in the future with other questions or concerns.    Plan: I will close patient case at this time as patient does not qualify for medication assistance programs listed above.  Patient will continue to request samples from provider.   Ralene Bathe, PharmD, [place] [phone]

## 2018-01-05 ENCOUNTER — Ambulatory Visit
Admission: RE | Admit: 2018-01-05 | Discharge: 2018-01-05 | Disposition: A | Discharge status: Home / Self Care | Payer: Medicare Other | Source: Ambulatory Visit | Attending: Internal Medicine | Admitting: Internal Medicine

## 2018-01-05 DIAGNOSIS — Z1231 Encounter for screening mammogram for malignant neoplasm of breast: Secondary | ICD-10-CM

## 2018-01-21 HISTORY — PX: CATARACT EXTRACTION, BILATERAL: SHX1313

## 2018-02-06 DIAGNOSIS — Z23 Encounter for immunization: Secondary | ICD-10-CM

## 2018-02-06 DIAGNOSIS — Z0000 Encounter for general adult medical examination without abnormal findings: Secondary | ICD-10-CM

## 2018-02-06 DIAGNOSIS — E042 Nontoxic multinodular goiter: Secondary | ICD-10-CM

## 2018-02-06 DIAGNOSIS — E2839 Other primary ovarian failure: Secondary | ICD-10-CM

## 2018-02-06 DIAGNOSIS — N183 Chronic kidney disease, stage 3 (moderate): Secondary | ICD-10-CM

## 2018-02-06 DIAGNOSIS — I129 Hypertensive chronic kidney disease with stage 1 through stage 4 chronic kidney disease, or unspecified chronic kidney disease: Secondary | ICD-10-CM

## 2018-02-06 DIAGNOSIS — N08 Glomerular disorders in diseases classified elsewhere: Secondary | ICD-10-CM

## 2018-02-06 DIAGNOSIS — E1122 Type 2 diabetes mellitus with diabetic chronic kidney disease: Secondary | ICD-10-CM

## 2018-02-20 ENCOUNTER — Other Ambulatory Visit: Payer: Self-pay | Admitting: Internal Medicine

## 2018-02-20 DIAGNOSIS — E2839 Other primary ovarian failure: Secondary | ICD-10-CM

## 2018-02-21 ENCOUNTER — Telehealth: Payer: Self-pay

## 2018-02-21 NOTE — Telephone Encounter (Signed)
Left the pt a message that her last bone density scan was on Nov. 20, 2017.

## 2018-02-21 NOTE — Telephone Encounter (Signed)
Returned the pt's call and left a message that her last bone density scan was last done on April 11, 2016.

## 2018-04-18 ENCOUNTER — Ambulatory Visit
Admission: RE | Admit: 2018-04-18 | Discharge: 2018-04-18 | Disposition: A | Discharge status: Home / Self Care | Payer: Medicare Other | Source: Ambulatory Visit | Attending: Internal Medicine | Admitting: Internal Medicine

## 2018-04-18 DIAGNOSIS — E2839 Other primary ovarian failure: Secondary | ICD-10-CM

## 2018-04-18 DIAGNOSIS — Z780 Asymptomatic menopausal state: Secondary | ICD-10-CM | POA: Diagnosis not present

## 2018-04-18 DIAGNOSIS — M85851 Other specified disorders of bone density and structure, right thigh: Secondary | ICD-10-CM | POA: Diagnosis not present

## 2018-04-30 DIAGNOSIS — E118 Type 2 diabetes mellitus with unspecified complications: Secondary | ICD-10-CM | POA: Diagnosis not present

## 2018-04-30 DIAGNOSIS — B351 Tinea unguium: Secondary | ICD-10-CM | POA: Diagnosis not present

## 2018-05-01 ENCOUNTER — Other Ambulatory Visit: Payer: Self-pay

## 2018-05-04 DIAGNOSIS — H26493 Other secondary cataract, bilateral: Secondary | ICD-10-CM | POA: Diagnosis not present

## 2018-05-19 ENCOUNTER — Other Ambulatory Visit: Payer: Self-pay | Admitting: Internal Medicine

## 2018-05-24 ENCOUNTER — Other Ambulatory Visit: Payer: Self-pay

## 2018-05-24 ENCOUNTER — Ambulatory Visit (INDEPENDENT_AMBULATORY_CARE_PROVIDER_SITE_OTHER): Payer: Medicare Other | Admitting: Internal Medicine

## 2018-05-24 ENCOUNTER — Encounter: Payer: Self-pay | Admitting: Internal Medicine

## 2018-05-24 VITALS — BP 112/70 | HR 80 | Temp 98.2°F | Ht 59.0 in | Wt 157.4 lb

## 2018-05-24 DIAGNOSIS — R0789 Other chest pain: Secondary | ICD-10-CM | POA: Diagnosis not present

## 2018-05-24 DIAGNOSIS — E1122 Type 2 diabetes mellitus with diabetic chronic kidney disease: Secondary | ICD-10-CM

## 2018-05-24 DIAGNOSIS — E7800 Pure hypercholesterolemia, unspecified: Secondary | ICD-10-CM | POA: Diagnosis not present

## 2018-05-24 DIAGNOSIS — N183 Chronic kidney disease, stage 3 unspecified: Secondary | ICD-10-CM

## 2018-05-24 DIAGNOSIS — I129 Hypertensive chronic kidney disease with stage 1 through stage 4 chronic kidney disease, or unspecified chronic kidney disease: Secondary | ICD-10-CM

## 2018-05-24 DIAGNOSIS — Z6831 Body mass index (BMI) 31.0-31.9, adult: Secondary | ICD-10-CM

## 2018-05-24 DIAGNOSIS — E6609 Other obesity due to excess calories: Secondary | ICD-10-CM

## 2018-05-24 DIAGNOSIS — K219 Gastro-esophageal reflux disease without esophagitis: Secondary | ICD-10-CM

## 2018-05-24 DIAGNOSIS — Z7982 Long term (current) use of aspirin: Secondary | ICD-10-CM

## 2018-05-24 MED ORDER — PANTOPRAZOLE SODIUM 40 MG PO TBEC: 40.0000 mg | DELAYED_RELEASE_TABLET | Freq: Every day | ORAL | 1 refills | Status: DC

## 2018-05-24 MED ORDER — SEMAGLUTIDE(0.25 OR 0.5MG/DOS) 2 MG/1.5ML ~~LOC~~ SOPN: 0.5000 mg | PEN_INJECTOR | SUBCUTANEOUS | 1 refills | Status: DC

## 2018-05-24 NOTE — Progress Notes (Signed)
Subjective:     Patient ID: Nicole Nunez , female    DOB: [birth year] , 73 y.o.   MRN: [id number]   Chief Complaint  Patient presents with  . Diabetes  . Hypertension    HPI  Diabetes  She presents for her follow-up diabetic visit. She has type 2 diabetes mellitus. Her disease course has been stable. There are no hypoglycemic associated symptoms. Pertinent negatives for diabetes include no blurred vision and no chest pain (she c/o heartburn, described as burning sensation to her chest. sometimes radiates to her back.). There are no hypoglycemic complications. Diabetic complications include nephropathy. Risk factors for coronary artery disease include diabetes mellitus, obesity, post-menopausal and hypertension.  Hypertension  This is a chronic problem. The current episode started more than 1 year ago. The problem has been gradually improving since onset. The problem is controlled. Pertinent negatives include no blurred vision, chest pain (she c/o heartburn, described as burning sensation to her chest. sometimes radiates to her back.), palpitations or shortness of breath.   She reports compliance with meds.   Past Medical History:  Diagnosis Date  . CKD stage 3 due to type 2 diabetes mellitus ([place])   . DM (diabetes mellitus) ([place])   . High cholesterol   . HTN (hypertension)      Family History  Problem Relation Age of Onset  . Asthma Mother   . Hypertension Mother   . Alzheimer's disease Mother   . Early death Father      Current Outpatient Medications:  .  aspirin EC 81 MG tablet, Take 81 mg by mouth daily., Disp: , Rfl:  .  Calcium Carbonate (CALCIUM 600 PO), Take 1 tablet by mouth., Disp: , Rfl:  .  Cholecalciferol (VITAMIN D3) 125 MCG (5000 UT) CAPS, Take 1 capsule by mouth., Disp: , Rfl:  .  olmesartan (BENICAR) 20 MG tablet, TAKE 1 TABLET BY MOUTH EVERY DAY, Disp: 90 tablet, Rfl: 0 .  pioglitazone-metformin (ACTOPLUS MET) 15-500 MG tablet, Take 1 tablet by mouth 2  (two) times daily with a meal., Disp: , Rfl:  .  Pitavastatin Calcium 2 MG TABS, Take 1 tablet by mouth daily., Disp: , Rfl:  .  Semaglutide (OZEMPIC) 0.25 or 0.5 MG/DOSE SOPN, Inject 0.5 mg into the skin once a week. Thursday, Disp: , Rfl:  .  ONETOUCH VERIO test strip, USE AS DIRECTED TO CHECK BLOOD SUGARS ONCE D, Disp: , Rfl: 11   No Known Allergies   Review of Systems  Constitutional: Negative.   Eyes: Negative for blurred vision.  Respiratory: Negative.  Negative for shortness of breath.   Cardiovascular: Negative.  Negative for chest pain (she c/o heartburn, described as burning sensation to her chest. sometimes radiates to her back.) and palpitations.  Gastrointestinal: Negative.   Neurological: Negative.   Psychiatric/Behavioral: Negative.      Today's Vitals   05/24/18 1102  BP: 112/70  Pulse: 80  Temp: 98.2 F (36.8 C)  TempSrc: Oral  Weight: 157 lb 6.4 oz (71.4 kg)  Height: '4\' 11"'  (1.499 m)  PainSc: 0-No pain   Body mass index is 31.79 kg/m.   Objective:  Physical Exam Vitals signs and nursing note reviewed.  Constitutional:      Appearance: Normal appearance. She is obese.  HENT:     Head: Normocephalic and atraumatic.  Cardiovascular:     Rate and Rhythm: Normal rate and regular rhythm.     Heart sounds: Normal heart sounds.  Pulmonary:  Effort: Pulmonary effort is normal.  Skin:    General: Skin is warm.  Neurological:     General: No focal deficit present.     Mental Status: She is alert.  Psychiatric:        Mood and Affect: Mood normal.         Assessment And Plan:     1. Type 2 diabetes mellitus with stage 3 chronic kidney disease, without long-term current use of insulin (HCC)  I will check CMP, Hba1c. She is encouraged to resume her regular exercise regimen.    2. Chronic renal disease, stage III (HCC)  Chronic. I will check a GFR, Cr today. She is encouraged to stay well hydrated.   3. Parenchymal renal hypertension, stage 1  through stage 4 or unspecified chronic kidney disease  Well controlled. She will continue with current meds. She is encouraged to avoid adding salt to her foods.   4. Pure hypercholesterolemia  She will continue with current meds. She is encouraged to avoid fried foods and to increase exercise.   5. Class 1 obesity due to excess calories with serious comorbidity and body mass index (BMI) of 31.0 to 31.9 in adult  She is encouraged to strive for BMI less than 28 to decrease cardiac risk. She is encouraged to avoid sugary beverages, processed foods and to exercise 30 minutes five days weekly.   6. Gastroesophageal reflux disease without esophagitis  She was given rx pantoprazole 60m once daily. She will rto in four weks for re-evaluation.   7. Other chest pain  Due to cardiac risk factors, ekg performed. This was normal. I do think her sx are GI related.   - EKG 12-Lead  RMaximino Greenland MD

## 2018-05-24 NOTE — Patient Instructions (Signed)
Diabetes Mellitus and Exercise Exercising regularly is important for your overall health, especially when you have diabetes (diabetes mellitus). Exercising is not only about losing weight. It has many other health benefits, such as increasing muscle strength and bone density and reducing body fat and stress. This leads to improved fitness, flexibility, and endurance, all of which result in better overall health. Exercise has additional benefits for people with diabetes, including:  Reducing appetite.  Helping to lower and control blood glucose.  Lowering blood pressure.  Helping to control amounts of fatty substances (lipids) in the blood, such as cholesterol and triglycerides.  Helping the body to respond better to insulin (improving insulin sensitivity).  Reducing how much insulin the body needs.  Decreasing the risk for heart disease by: ? Lowering cholesterol and triglyceride levels. ? Increasing the levels of good cholesterol. ? Lowering blood glucose levels. What is my activity plan? Your health care provider or certified diabetes educator can help you make a plan for the type and frequency of exercise (activity plan) that works for you. Make sure that you:  Do at least 150 minutes of moderate-intensity or vigorous-intensity exercise each week. This could be brisk walking, biking, or water aerobics. ? Do stretching and strength exercises, such as yoga or weightlifting, at least 2 times a week. ? Spread out your activity over at least 3 days of the week.  Get some form of physical activity every day. ? Do not go more than 2 days in a row without some kind of physical activity. ? Avoid being inactive for more than 30 minutes at a time. Take frequent breaks to walk or stretch.  Choose a type of exercise or activity that you enjoy, and set realistic goals.  Start slowly, and gradually increase the intensity of your exercise over time. What do I need to know about managing my  diabetes?   Check your blood glucose before and after exercising. ? If your blood glucose is 240 mg/dL (13.3 mmol/L) or higher before you exercise, check your urine for ketones. If you have ketones in your urine, do not exercise until your blood glucose returns to normal. ? If your blood glucose is 100 mg/dL (5.6 mmol/L) or lower, eat a snack containing 15-20 grams of carbohydrate. Check your blood glucose 15 minutes after the snack to make sure that your level is above 100 mg/dL (5.6 mmol/L) before you start your exercise.  Know the symptoms of low blood glucose (hypoglycemia) and how to treat it. Your risk for hypoglycemia increases during and after exercise. Common symptoms of hypoglycemia can include: ? Hunger. ? Anxiety. ? Sweating and feeling clammy. ? Confusion. ? Dizziness or feeling light-headed. ? Increased heart rate or palpitations. ? Blurry vision. ? Tingling or numbness around the mouth, lips, or tongue. ? Tremors or shakes. ? Irritability.  Keep a rapid-acting carbohydrate snack available before, during, and after exercise to help prevent or treat hypoglycemia.  Avoid injecting insulin into areas of the body that are going to be exercised. For example, avoid injecting insulin into: ? The arms, when playing tennis. ? The legs, when jogging.  Keep records of your exercise habits. Doing this can help you and your health care provider adjust your diabetes management plan as needed. Write down: ? Food that you eat before and after you exercise. ? Blood glucose levels before and after you exercise. ? The type and amount of exercise you have done. ? When your insulin is expected to peak, if you use   insulin. Avoid exercising at times when your insulin is peaking.  When you start a new exercise or activity, work with your health care provider to make sure the activity is safe for you, and to adjust your insulin, medicines, or food intake as needed.  Drink plenty of water while  you exercise to prevent dehydration or heat stroke. Drink enough fluid to keep your urine clear or pale yellow. Summary  Exercising regularly is important for your overall health, especially when you have diabetes (diabetes mellitus).  Exercising has many health benefits, such as increasing muscle strength and bone density and reducing body fat and stress.  Your health care provider or certified diabetes educator can help you make a plan for the type and frequency of exercise (activity plan) that works for you.  When you start a new exercise or activity, work with your health care provider to make sure the activity is safe for you, and to adjust your insulin, medicines, or food intake as needed. This information is not intended to replace advice given to you by your health care provider. Make sure you discuss any questions you have with your health care provider. Document Released: 07/30/2003 Document Revised: 11/17/2016 Document Reviewed: 10/19/2015 Elsevier Interactive Patient Education  2019 Elsevier Inc.  

## 2018-05-25 LAB — CMP14+EGFR
ALT: 14 IU/L (ref 0–32)
AST: 20 IU/L (ref 0–40)
Albumin/Globulin Ratio: 1.8 (ref 1.2–2.2)
Albumin: 4.4 g/dL (ref 3.5–4.8)
Alkaline Phosphatase: 85 IU/L (ref 39–117)
BILIRUBIN TOTAL: 0.4 mg/dL (ref 0.0–1.2)
BUN/Creatinine Ratio: 18 (ref 12–28)
BUN: 22 mg/dL (ref 8–27)
CO2: 21 mmol/L (ref 20–29)
Calcium: 9.2 mg/dL (ref 8.7–10.3)
Chloride: 105 mmol/L (ref 96–106)
Creatinine, Ser: 1.23 mg/dL — ABNORMAL HIGH (ref 0.57–1.00)
GFR calc Af Amer: 51 mL/min/{1.73_m2} — ABNORMAL LOW (ref >59)
GFR calc non Af Amer: 45 mL/min/{1.73_m2} — ABNORMAL LOW (ref >59)
Globulin, Total: 2.4 g/dL (ref 1.5–4.5)
Glucose: 105 mg/dL — ABNORMAL HIGH (ref 65–99)
Potassium: 4.5 mmol/L (ref 3.5–5.2)
Sodium: 144 mmol/L (ref 134–144)
TOTAL PROTEIN: 6.8 g/dL (ref 6.0–8.5)

## 2018-05-25 LAB — HEMOGLOBIN A1C
Est. average glucose Bld gHb Est-mCnc: 134 mg/dL
Hgb A1c MFr Bld: 6.3 % — ABNORMAL HIGH (ref 4.8–5.6)

## 2018-05-26 NOTE — Progress Notes (Signed)
Your kidney fxn is stable. Your liver fxn is nl. Your hba1c is 6.3, this is great!

## 2018-06-05 ENCOUNTER — Other Ambulatory Visit: Payer: Self-pay | Admitting: Internal Medicine

## 2018-06-07 DIAGNOSIS — E113211 Type 2 diabetes mellitus with mild nonproliferative diabetic retinopathy with macular edema, right eye: Secondary | ICD-10-CM | POA: Diagnosis not present

## 2018-06-07 DIAGNOSIS — H43393 Other vitreous opacities, bilateral: Secondary | ICD-10-CM | POA: Diagnosis not present

## 2018-06-07 DIAGNOSIS — E113292 Type 2 diabetes mellitus with mild nonproliferative diabetic retinopathy without macular edema, left eye: Secondary | ICD-10-CM | POA: Diagnosis not present

## 2018-06-07 DIAGNOSIS — Z7984 Long term (current) use of oral hypoglycemic drugs: Secondary | ICD-10-CM | POA: Diagnosis not present

## 2018-06-07 DIAGNOSIS — Z961 Presence of intraocular lens: Secondary | ICD-10-CM | POA: Diagnosis not present

## 2018-06-07 LAB — HM DIABETES EYE EXAM

## 2018-06-22 ENCOUNTER — Other Ambulatory Visit: Payer: Self-pay

## 2018-06-22 MED ORDER — OLMESARTAN MEDOXOMIL 20 MG PO TABS: 20.0000 mg | ORAL_TABLET | Freq: Every day | ORAL | 1 refills | Status: DC

## 2018-07-18 DIAGNOSIS — B351 Tinea unguium: Secondary | ICD-10-CM | POA: Diagnosis not present

## 2018-07-18 DIAGNOSIS — B353 Tinea pedis: Secondary | ICD-10-CM | POA: Diagnosis not present

## 2018-07-18 DIAGNOSIS — E118 Type 2 diabetes mellitus with unspecified complications: Secondary | ICD-10-CM | POA: Diagnosis not present

## 2018-07-18 DIAGNOSIS — L84 Corns and callosities: Secondary | ICD-10-CM | POA: Diagnosis not present

## 2018-07-22 ENCOUNTER — Other Ambulatory Visit: Payer: Self-pay | Admitting: Internal Medicine

## 2018-08-21 ENCOUNTER — Other Ambulatory Visit: Payer: Self-pay | Admitting: Internal Medicine

## 2018-08-27 ENCOUNTER — Encounter: Payer: Self-pay | Admitting: Internal Medicine

## 2018-08-27 ENCOUNTER — Other Ambulatory Visit: Payer: Self-pay

## 2018-08-27 ENCOUNTER — Ambulatory Visit (INDEPENDENT_AMBULATORY_CARE_PROVIDER_SITE_OTHER): Payer: Medicare Other | Admitting: Internal Medicine

## 2018-08-27 VITALS — BP 124/68 | HR 86 | Temp 97.9°F | Ht 59.0 in | Wt 158.0 lb

## 2018-08-27 DIAGNOSIS — I129 Hypertensive chronic kidney disease with stage 1 through stage 4 chronic kidney disease, or unspecified chronic kidney disease: Secondary | ICD-10-CM

## 2018-08-27 DIAGNOSIS — E6609 Other obesity due to excess calories: Secondary | ICD-10-CM

## 2018-08-27 DIAGNOSIS — N183 Type 2 diabetes mellitus with diabetic chronic kidney disease: Secondary | ICD-10-CM

## 2018-08-27 DIAGNOSIS — E7800 Pure hypercholesterolemia, unspecified: Secondary | ICD-10-CM

## 2018-08-27 DIAGNOSIS — Z6831 Body mass index (BMI) 31.0-31.9, adult: Secondary | ICD-10-CM

## 2018-08-27 DIAGNOSIS — E1122 Type 2 diabetes mellitus with diabetic chronic kidney disease: Secondary | ICD-10-CM | POA: Diagnosis not present

## 2018-08-27 LAB — POCT URINALYSIS DIPSTICK
Bilirubin, UA: NEGATIVE
Blood, UA: NEGATIVE
Glucose, UA: NEGATIVE
Ketones, UA: NEGATIVE
Leukocytes, UA: NEGATIVE
Nitrite, UA: NEGATIVE
Protein, UA: NEGATIVE
Spec Grav, UA: >=1.03 — AB (ref 1.010–1.025)
Urobilinogen, UA: 0.2 E.U./dL
pH, UA: 5 (ref 5.0–8.0)

## 2018-08-27 LAB — POCT UA - MICROALBUMIN
Albumin/Creatinine Ratio, Urine, POC: <30
Creatinine, POC: 300 mg/dL
Microalbumin Ur, POC: 30 mg/L

## 2018-08-27 NOTE — Progress Notes (Signed)
Subjective:     Patient ID: Nicole Nunez , female    DOB: 04/27/1946 , 72 y.o.   MRN: [id number]   Chief Complaint  Patient presents with  . Diabetes  . Hypertension    HPI  Diabetes  She presents for her follow-up diabetic visit. She has type 2 diabetes mellitus. Her disease course has been stable. There are no hypoglycemic associated symptoms. Pertinent negatives for diabetes include no blurred vision and no chest pain. There are no hypoglycemic complications.  Hypertension  This is a chronic problem. The current episode started more than 1 year ago. The problem has been gradually improving since onset. The problem is controlled. Pertinent negatives include no blurred vision, chest pain, palpitations or shortness of breath.     Past Medical History:  Diagnosis Date  . CKD stage 3 due to type 2 diabetes mellitus (HCC)   . DM (diabetes mellitus) (HCC)   . High cholesterol   . HTN (hypertension)      Family History  Problem Relation Age of Onset  . Asthma Mother   . Hypertension Mother   . Alzheimer's disease Mother   . Early death Father      Current Outpatient Medications:  .  aspirin EC 81 MG tablet, Take 81 mg by mouth daily., Disp: , Rfl:  .  Calcium Carbonate (CALCIUM 600 PO), Take 1 tablet by mouth., Disp: , Rfl:  .  Cholecalciferol (VITAMIN D3) 125 MCG (5000 UT) CAPS, Take 1 capsule by mouth., Disp: , Rfl:  .  olmesartan (BENICAR) 20 MG tablet, TAKE 1 TABLET BY MOUTH EVERY DAY, Disp: 90 tablet, Rfl: 1 .  ONETOUCH VERIO test strip, USE AS DIRECTED TO CHECK BLOOD SUGARS ONCE D, Disp: , Rfl: 11 .  pantoprazole (PROTONIX) 40 MG tablet, TAKE 1 TABLET(40 MG) BY MOUTH DAILY, Disp: 30 tablet, Rfl: 1 .  pioglitazone-metformin (ACTOPLUS MET) 15-500 MG tablet, TAKE 1 TABLET BY MOUTH  TWICE A DAY, Disp: 180 tablet, Rfl: 1 .  Pitavastatin Calcium 2 MG TABS, Take 1 tablet by mouth daily., Disp: , Rfl:  .  Semaglutide,0.25 or 0.5MG/DOS, (OZEMPIC, 0.25 OR 0.5 MG/DOSE,) 2  MG/1.5ML SOPN, Inject 0.5 mg into the skin once a week. Thursday, Disp: 3 pen, Rfl: 1   No Known Allergies   Review of Systems  Constitutional: Negative.   Eyes: Negative for blurred vision.  Respiratory: Negative.  Negative for shortness of breath.   Cardiovascular: Negative.  Negative for chest pain and palpitations.  Gastrointestinal: Negative.   Neurological: Negative.   Psychiatric/Behavioral: Negative.      Today's Vitals   08/27/18 0955  BP: 124/68  Pulse: 86  Temp: 97.9 F (36.6 C)  TempSrc: Oral  Weight: 158 lb (71.7 kg)  Height: 4' 11" (1.499 m)  PainSc: 0-No pain   Body mass index is 31.91 kg/m.   Objective:  Physical Exam Vitals signs and nursing note reviewed.  Constitutional:      Appearance: Normal appearance.  HENT:     Head: Normocephalic and atraumatic.  Cardiovascular:     Rate and Rhythm: Normal rate and regular rhythm.     Heart sounds: Normal heart sounds.  Pulmonary:     Effort: Pulmonary effort is normal.     Breath sounds: Normal breath sounds.  Skin:    General: Skin is warm.  Neurological:     General: No focal deficit present.     Mental Status: She is alert.  Psychiatric:          Mood and Affect: Mood normal.        Behavior: Behavior normal.         Assessment And Plan:     1. Type 2 diabetes mellitus with stage 3 chronic kidney disease, without long-term current use of insulin (HCC)  I will check labs as below. She will rto in June 2020 for her next AWV. I will also check u/a and microalbumin today.   - BMP8+EGFR - Hemoglobin A1c  2. Parenchymal renal hypertension, stage 1 through stage 4 or unspecified chronic kidney disease  Well controlled. She will continue with current meds. She is encouraged to avoid adding salt to her foods.   3. Pure hypercholesterolemia  I will check fasting lipid panel at her next visit. She is encouraged to avoid fried foods, increase her fish intake and exercise no less than four days weekly    4. Class 1 obesity due to excess calories with serious comorbidity and body mass index (BMI) of 31.0 to 31.9 in adult  Importance of achieving optimal weight to decrease risk of cardiovascular disease and cancers was discussed with the patient in full detail. She is encouraged to start slowly - start with 10 minutes twice daily at least three to four days per week and to gradually build to 30 minutes five days weekly. She was given tips to incorporate more activity into her daily routine - take stairs when possible, park farther away from her job, grocery stores, etc.     N , MD    THE PATIENT IS ENCOURAGED TO PRACTICE SOCIAL DISTANCING DUE TO THE COVID-19 PANDEMIC.   

## 2018-08-28 LAB — BMP8+EGFR
BUN/Creatinine Ratio: 18 (ref 12–28)
BUN: 21 mg/dL (ref 8–27)
CO2: 23 mmol/L (ref 20–29)
Calcium: 9.4 mg/dL (ref 8.7–10.3)
Chloride: 103 mmol/L (ref 96–106)
Creatinine, Ser: 1.18 mg/dL — ABNORMAL HIGH (ref 0.57–1.00)
GFR calc Af Amer: 54 mL/min/{1.73_m2} — ABNORMAL LOW (ref >59)
GFR calc non Af Amer: 47 mL/min/{1.73_m2} — ABNORMAL LOW (ref >59)
Glucose: 104 mg/dL — ABNORMAL HIGH (ref 65–99)
Potassium: 4.1 mmol/L (ref 3.5–5.2)
Sodium: 140 mmol/L (ref 134–144)

## 2018-08-28 LAB — HEMOGLOBIN A1C
Est. average glucose Bld gHb Est-mCnc: 134 mg/dL
Hgb A1c MFr Bld: 6.3 % — ABNORMAL HIGH (ref 4.8–5.6)

## 2018-09-24 ENCOUNTER — Other Ambulatory Visit: Payer: Self-pay

## 2018-09-24 DIAGNOSIS — E118 Type 2 diabetes mellitus with unspecified complications: Secondary | ICD-10-CM | POA: Diagnosis not present

## 2018-09-24 DIAGNOSIS — L84 Corns and callosities: Secondary | ICD-10-CM | POA: Diagnosis not present

## 2018-09-24 DIAGNOSIS — B351 Tinea unguium: Secondary | ICD-10-CM | POA: Diagnosis not present

## 2018-09-24 NOTE — Patient Outreach (Signed)
[place] [facility]) Care Management  09/24/2018  ALEXANDRYA CHIM 28-Feb-1946 [id number]   Medication Adherence call to Mrs. Nyra Capes HIPPA Compliant Voice message left with a call back number. Mrs. [place] is showing past due on Ozempic under [place].   [place] Management Direct Dial [phone]  Fax [phone] [email or web address]

## 2018-09-26 ENCOUNTER — Other Ambulatory Visit: Payer: Self-pay | Admitting: Internal Medicine

## 2018-11-07 ENCOUNTER — Other Ambulatory Visit: Payer: Self-pay | Admitting: Internal Medicine

## 2018-11-08 ENCOUNTER — Other Ambulatory Visit: Payer: Self-pay | Admitting: Internal Medicine

## 2018-11-19 DIAGNOSIS — L84 Corns and callosities: Secondary | ICD-10-CM | POA: Diagnosis not present

## 2018-11-19 DIAGNOSIS — E118 Type 2 diabetes mellitus with unspecified complications: Secondary | ICD-10-CM | POA: Diagnosis not present

## 2018-11-19 DIAGNOSIS — B351 Tinea unguium: Secondary | ICD-10-CM | POA: Diagnosis not present

## 2018-11-22 ENCOUNTER — Other Ambulatory Visit: Payer: Self-pay | Admitting: Internal Medicine

## 2018-11-27 ENCOUNTER — Other Ambulatory Visit: Payer: Self-pay

## 2018-11-27 NOTE — Patient Outreach (Signed)
[facility]) Care Management  11/27/2018  [name] 06/13/1945 [id number]   Medication Adherence call to Mrs. Nicole Nunez HIPPA Compliant Voice message left with a call back number. Ms. Nicole Nunez is showing past due on Ozempic under [facility].   [facility] Direct Dial [phone]  Fax [phone] [email or web address]

## 2018-11-29 ENCOUNTER — Telehealth: Payer: Self-pay

## 2018-11-29 DIAGNOSIS — N183 Type 2 diabetes mellitus with diabetic chronic kidney disease: Secondary | ICD-10-CM

## 2018-11-29 DIAGNOSIS — E1122 Type 2 diabetes mellitus with diabetic chronic kidney disease: Secondary | ICD-10-CM

## 2018-11-29 NOTE — Telephone Encounter (Signed)
The pt was notified that her sample of Ozempic is ready for pickup and that a referral will be made for the pt to have help finding resources for her medications.

## 2018-12-05 ENCOUNTER — Ambulatory Visit: Payer: Self-pay

## 2018-12-05 NOTE — Chronic Care Management (AMB) (Signed)
  Chronic Care Management   Outreach Note  12/05/2018 Name: Nicole Nunez MRN: [id number] DOB: [birth year]  Referred by: Glendale Chard, MD Reason for referral : Medication Assistance  An unsuccessful telephone outreach was attempted today. The patient was referred to the case management team by for assistance with chronic care management and care coordination.   Follow Up Plan: A HIPPA compliant phone message was left for the patient providing contact information and requesting a return call.  The care management team will reach out to the patient again over the next 14 days.   Daneen Schick, BSW, CDP Social Worker, Certified Dementia Practitioner [facility] [phone]

## 2018-12-07 ENCOUNTER — Other Ambulatory Visit: Payer: Self-pay | Admitting: Internal Medicine

## 2018-12-07 DIAGNOSIS — Z1231 Encounter for screening mammogram for malignant neoplasm of breast: Secondary | ICD-10-CM

## 2018-12-17 ENCOUNTER — Ambulatory Visit: Payer: Self-pay

## 2018-12-17 DIAGNOSIS — E1122 Type 2 diabetes mellitus with diabetic chronic kidney disease: Secondary | ICD-10-CM

## 2018-12-17 DIAGNOSIS — N183 Chronic kidney disease, stage 3 unspecified: Secondary | ICD-10-CM

## 2018-12-17 NOTE — Chronic Care Management (AMB) (Signed)
  Chronic Care Management   Outreach Note  12/17/2018 Name: Nicole Nunez MRN: [id number] DOB: Jul 25, 1945  Referred by: Glendale Chard, MD Reason for referral : Medication Assistance  A second unsuccessful telephone outreach was attempted today. The patient was referred to the case management team for assistance with chronic care management and care coordination.   Follow Up Plan: A HIPPA compliant phone message was left for the patient providing contact information and requesting a return call.  The care management team will reach out to the patient again over the next 10 days.   Daneen Schick, BSW, CDP Social Worker, Certified Dementia Practitioner [facility] [phone]

## 2018-12-20 ENCOUNTER — Other Ambulatory Visit: Payer: Self-pay

## 2018-12-20 ENCOUNTER — Ambulatory Visit (INDEPENDENT_AMBULATORY_CARE_PROVIDER_SITE_OTHER): Payer: Medicare Other | Admitting: Internal Medicine

## 2018-12-20 ENCOUNTER — Ambulatory Visit: Payer: Self-pay | Admitting: Nurse Practitioner

## 2018-12-20 ENCOUNTER — Encounter: Payer: Self-pay | Admitting: Internal Medicine

## 2018-12-20 ENCOUNTER — Ambulatory Visit (INDEPENDENT_AMBULATORY_CARE_PROVIDER_SITE_OTHER): Payer: Medicare Other

## 2018-12-20 ENCOUNTER — Ambulatory Visit: Payer: Self-pay | Admitting: Internal Medicine

## 2018-12-20 VITALS — BP 126/72 | HR 82 | Temp 98.6°F | Ht <= 58 in | Wt 159.6 lb

## 2018-12-20 DIAGNOSIS — N183 Type 2 diabetes mellitus with diabetic chronic kidney disease: Secondary | ICD-10-CM

## 2018-12-20 DIAGNOSIS — E6609 Other obesity due to excess calories: Secondary | ICD-10-CM

## 2018-12-20 DIAGNOSIS — I129 Hypertensive chronic kidney disease with stage 1 through stage 4 chronic kidney disease, or unspecified chronic kidney disease: Secondary | ICD-10-CM | POA: Diagnosis not present

## 2018-12-20 DIAGNOSIS — Z0000 Encounter for general adult medical examination without abnormal findings: Secondary | ICD-10-CM | POA: Diagnosis not present

## 2018-12-20 DIAGNOSIS — M7989 Other specified soft tissue disorders: Secondary | ICD-10-CM

## 2018-12-20 DIAGNOSIS — M8949 Other hypertrophic osteoarthropathy, multiple sites: Secondary | ICD-10-CM

## 2018-12-20 DIAGNOSIS — Z0001 Encounter for general adult medical examination with abnormal findings: Secondary | ICD-10-CM | POA: Diagnosis not present

## 2018-12-20 DIAGNOSIS — E1122 Type 2 diabetes mellitus with diabetic chronic kidney disease: Secondary | ICD-10-CM

## 2018-12-20 DIAGNOSIS — Z6833 Body mass index (BMI) 33.0-33.9, adult: Secondary | ICD-10-CM

## 2018-12-20 DIAGNOSIS — M150 Primary generalized (osteo)arthritis: Secondary | ICD-10-CM | POA: Diagnosis not present

## 2018-12-20 DIAGNOSIS — M159 Polyosteoarthritis, unspecified: Secondary | ICD-10-CM

## 2018-12-20 LAB — POCT UA - MICROALBUMIN
Albumin/Creatinine Ratio, Urine, POC: <30
Creatinine, POC: 300 mg/dL
Microalbumin Ur, POC: 30 mg/L

## 2018-12-20 LAB — POCT URINALYSIS DIPSTICK
Bilirubin, UA: NEGATIVE
Blood, UA: NEGATIVE
Glucose, UA: NEGATIVE
Ketones, UA: NEGATIVE
Leukocytes, UA: NEGATIVE
Nitrite, UA: NEGATIVE
Protein, UA: NEGATIVE
Spec Grav, UA: 1.025 (ref 1.010–1.025)
Urobilinogen, UA: 0.2 E.U./dL
pH, UA: 5.5 (ref 5.0–8.0)

## 2018-12-20 NOTE — Patient Instructions (Signed)
Health Maintenance, Female Adopting a healthy lifestyle and getting preventive care are important in promoting health and wellness. Ask your health care provider about:  The right schedule for you to have regular tests and exams.  Things you can do on your own to prevent diseases and keep yourself healthy. What should I know about diet, weight, and exercise? Eat a healthy diet   Eat a diet that includes plenty of vegetables, fruits, low-fat dairy products, and lean protein.  Do not eat a lot of foods that are high in solid fats, added sugars, or sodium. Maintain a healthy weight Body mass index (BMI) is used to identify weight problems. It estimates body fat based on height and weight. Your health care provider can help determine your BMI and help you achieve or maintain a healthy weight. Get regular exercise Get regular exercise. This is one of the most important things you can do for your health. Most adults should:  Exercise for at least 150 minutes each week. The exercise should increase your heart rate and make you sweat (moderate-intensity exercise).  Do strengthening exercises at least twice a week. This is in addition to the moderate-intensity exercise.  Spend less time sitting. Even light physical activity can be beneficial. Watch cholesterol and blood lipids Have your blood tested for lipids and cholesterol at 71 years of age, then have this test every 5 years. Have your cholesterol levels checked more often if:  Your lipid or cholesterol levels are high.  You are older than 71 years of age.  You are at high risk for heart disease. What should I know about cancer screening? Depending on your health history and family history, you may need to have cancer screening at various ages. This may include screening for:  Breast cancer.  Cervical cancer.  Colorectal cancer.  Skin cancer.  Lung cancer. What should I know about heart disease, diabetes, and high blood  pressure? Blood pressure and heart disease  High blood pressure causes heart disease and increases the risk of stroke. This is more likely to develop in people who have high blood pressure readings, are of African descent, or are overweight.  Have your blood pressure checked: ? Every 3-5 years if you are 18-39 years of age. ? Every year if you are 40 years old or older. Diabetes Have regular diabetes screenings. This checks your fasting blood sugar level. Have the screening done:  Once every three years after age 40 if you are at a normal weight and have a low risk for diabetes.  More often and at a younger age if you are overweight or have a high risk for diabetes. What should I know about preventing infection? Hepatitis B If you have a higher risk for hepatitis B, you should be screened for this virus. Talk with your health care provider to find out if you are at risk for hepatitis B infection. Hepatitis C Testing is recommended for:  Everyone born from 1945 through 1965.  Anyone with known risk factors for hepatitis C. Sexually transmitted infections (STIs)  Get screened for STIs, including gonorrhea and chlamydia, if: ? You are sexually active and are younger than 71 years of age. ? You are older than 71 years of age and your health care provider tells you that you are at risk for this type of infection. ? Your sexual activity has changed since you were last screened, and you are at increased risk for chlamydia or gonorrhea. Ask your health care provider if   you are at risk.  Ask your health care provider about whether you are at high risk for HIV. Your health care provider may recommend a prescription medicine to help prevent HIV infection. If you choose to take medicine to prevent HIV, you should first get tested for HIV. You should then be tested every 3 months for as long as you are taking the medicine. Pregnancy  If you are about to stop having your period (premenopausal) and  you may become pregnant, seek counseling before you get pregnant.  Take 400 to 800 micrograms (mcg) of folic acid every day if you become pregnant.  Ask for birth control (contraception) if you want to prevent pregnancy. Osteoporosis and menopause Osteoporosis is a disease in which the bones lose minerals and strength with aging. This can result in bone fractures. If you are 65 years old or older, or if you are at risk for osteoporosis and fractures, ask your health care provider if you should:  Be screened for bone loss.  Take a calcium or vitamin D supplement to lower your risk of fractures.  Be given hormone replacement therapy (HRT) to treat symptoms of menopause. Follow these instructions at home: Lifestyle  Do not use any products that contain nicotine or tobacco, such as cigarettes, e-cigarettes, and chewing tobacco. If you need help quitting, ask your health care provider.  Do not use street drugs.  Do not share needles.  Ask your health care provider for help if you need support or information about quitting drugs. Alcohol use  Do not drink alcohol if: ? Your health care provider tells you not to drink. ? You are pregnant, may be pregnant, or are planning to become pregnant.  If you drink alcohol: ? Limit how much you use to 0-1 drink a day. ? Limit intake if you are breastfeeding.  Be aware of how much alcohol is in your drink. In the [place] one drink equals one 12 oz bottle of beer (355 mL), one 5 oz glass of wine (148 mL), or one 1 oz glass of hard liquor (44 mL). General instructions  Schedule regular health, dental, and eye exams.  Stay current with your vaccines.  Tell your health care provider if: ? You often feel depressed. ? You have ever been abused or do not feel safe at home. Summary  Adopting a healthy lifestyle and getting preventive care are important in promoting health and wellness.  Follow your health care provider's instructions about healthy  diet, exercising, and getting tested or screened for diseases.  Follow your health care provider's instructions on monitoring your cholesterol and blood pressure. This information is not intended to replace advice given to you by your health care provider. Make sure you discuss any questions you have with your health care provider. Document Released: 11/22/2010 Document Revised: 05/02/2018 Document Reviewed: 05/02/2018 Elsevier Patient Education  2020 Elsevier Inc.  

## 2018-12-20 NOTE — Progress Notes (Signed)
Subjective:   Nicole Nunez is a 73 y.o. female who presents for Medicare Annual (Subsequent) preventive examination.  Review of Systems:  n/a Cardiac Risk Factors include: advanced age (>74mn, >>37women);diabetes mellitus;hypertension;sedentary lifestyle;obesity (BMI >30kg/m2)     Objective:     Vitals: BP 126/72 (BP Location: Left Arm, Patient Position: Sitting, Cuff Size: Normal)   Pulse 82   Temp 98.6 F (37 C) (Oral)   Ht 4' 9.8" (1.468 m)   Wt 159 lb 9.6 oz (72.4 kg)   SpO2 97%   BMI 33.59 kg/m   Body mass index is 33.59 kg/m.  Advanced Directives 12/20/2018  Does Patient Have a Medical Advance Directive? No  Would patient like information on creating a medical advance directive? No - Patient declined    Tobacco Social History   Tobacco Use  Smoking Status Never Smoker  Smokeless Tobacco Never Used     Counseling given: Not Answered   Clinical Intake:  Pre-visit preparation completed: Yes  Pain : No/denies pain     Nutritional Status: BMI > 30  Obese Nutritional Risks: None Diabetes: Yes CBG done?: No Did pt. bring in CBG monitor from home?: No  How often do you need to have someone help you when you read instructions, pamphlets, or other written materials from your doctor or pharmacy?: 1 - Never What is the last grade level you completed in school?: 12th grade  Interpreter Needed?: No  Information entered by :: NAllen LPN  Past Medical History:  Diagnosis Date  . CKD stage 3 due to type 2 diabetes mellitus [place]   . DM (diabetes mellitus) ([place]   . High cholesterol   . HTN (hypertension)    Past Surgical History:  Procedure Laterality Date  . CATARACT EXTRACTION, BILATERAL  01/2018   first was 12/2017  . corn removal Bilateral 06/2017   Family History  Problem Relation Age of Onset  . Asthma Mother   . Hypertension Mother   . Alzheimer's disease Mother   . Early death Father    Social History   Socioeconomic History  .  Marital status: Widowed    Spouse name: Not on file  . Number of children: Not on file  . Years of education: Not on file  . Highest education level: Not on file  Occupational History  . Not on file  Social Needs  . Financial resource strain: Not hard at all  . Food insecurity    Worry: Never true    Inability: Never true  . Transportation needs    Medical: No    Non-medical: No  Tobacco Use  . Smoking status: Never Smoker  . Smokeless tobacco: Never Used  Substance and Sexual Activity  . Alcohol use: Never    Frequency: Never  . Drug use: Never  . Sexual activity: Yes  Lifestyle  . Physical activity    Days per week: 0 days    Minutes per session: 0 min  . Stress: Not at all  Relationships  . Social [place] phone: Not on file    Gets together: Not on file    Attends religious service: Not on file    Active member of club or organization: Not on file    Attends meetings of clubs or organizations: Not on file    Relationship status: Not on file  Other Topics Concern  . Not on file  Social History Narrative  . Not on file  Outpatient Encounter Medications as of 12/20/2018  Medication Sig  . aspirin EC 81 MG tablet Take 81 mg by mouth daily.  . Calcium Carbonate (CALCIUM 600 PO) Take 1 tablet by mouth.  . Cholecalciferol (VITAMIN D3) 125 MCG (5000 UT) CAPS Take 1 capsule by mouth.  . Lancets (ONETOUCH DELICA PLUS [id number]) MISC USE AS DIRECTED TO CHECK BLOOD SUGARS ONCE DAILY  . olmesartan (BENICAR) 20 MG tablet TAKE 1 TABLET BY MOUTH EVERY DAY  . ONETOUCH VERIO test strip USE AS DIRECTED TO CHECK BLOOD SUGARS ONCE D  . OZEMPIC, 0.25 OR 0.5 MG/DOSE, 2 MG/1.5ML SOPN INJECT SUBCUTANEOUSLY 0.5MG ONCE A WEEK ON THURSDAY  . pantoprazole (PROTONIX) 40 MG tablet TAKE 1 TABLET(40 MG) BY MOUTH DAILY  . pioglitazone-metformin (ACTOPLUS MET) 15-500 MG tablet TAKE 1 TABLET BY MOUTH  TWICE A DAY  . Pitavastatin Calcium 2 MG TABS Take 1 tablet by mouth daily.    No facility-administered encounter medications on file as of 12/20/2018.     Activities of Daily Living In your present state of health, do you have any difficulty performing the following activities: 12/20/2018  Hearing? Y  Vision? N  Difficulty concentrating or making decisions? N  Walking or climbing stairs? N  Dressing or bathing? N  Doing errands, shopping? N  Preparing Food and eating ? N  Using the Toilet? N  In the past six months, have you accidently leaked urine? N  Do you have problems with loss of bowel control? N  Managing your Medications? N  Managing your Finances? N  Housekeeping or managing your Housekeeping? N  Some recent data might be hidden    Patient Care Team: Glendale Chard, MD as PCP - General (Internal Medicine)    Assessment:   This is a routine wellness examination for [place].  Exercise Activities and Dietary recommendations Current Exercise Habits: The patient does not participate in regular exercise at present  Goals    . Exercise 150 min/wk Moderate Activity     12/20/2018, wants to get back on exercise regimen       Fall Risk Fall Risk  12/20/2018 12/20/2018 08/27/2018 05/24/2018 12/21/2017  Falls in the past year? 0 Exclusion - non ambulatory 0 0 No  Risk for fall due to : - Medication side effect - - -  Follow up - Education provided;Falls evaluation completed;Falls prevention discussed - - -   Is the patient's home free of loose throw rugs in walkways, pet beds, electrical cords, etc?   yes      Grab bars in the bathroom? yes      Handrails on the stairs?   n/a      Adequate lighting?   yes  Timed Get Up and Go performed:  n/a  Depression Screen PHQ 2/9 Scores 12/20/2018 08/27/2018 05/24/2018 12/21/2017  PHQ - 2 Score 0 0 0 0  PHQ- 9 Score 0 - - -     Cognitive Function     6CIT Screen 12/20/2018  What Year? 0 points  What month? 0 points  What time? 0 points  Count back from 20 0 points  Months in reverse 0 points  Repeat phrase 0  points  Total Score 0    Immunization History  Administered Date(s) Administered  . DTaP 06/17/2011  . Influenza-Unspecified 02/06/2018  . Pneumococcal Polysaccharide-23 02/28/2013  . Pneumococcal-Unspecified 02/25/2013, 05/07/2015  . Zoster 04/30/2013    Qualifies for Shingles Vaccine? yes  Screening Tests Health Maintenance  Topic Date Due  .  PNA vac Low Risk Adult (2 of 2 - PCV13) 05/06/2016  . INFLUENZA VACCINE  12/22/2018  . FOOT EXAM  02/07/2019  . HEMOGLOBIN A1C  02/26/2019  . OPHTHALMOLOGY EXAM  06/08/2019  . MAMMOGRAM  01/06/2020  . TETANUS/TDAP  06/16/2021  . COLONOSCOPY  12/17/2025  . DEXA SCAN  Completed  . Hepatitis C Screening  Completed    Cancer Screenings: Lung: Low Dose CT Chest recommended if Age 58-80 years, 30 pack-year currently smoking OR have quit w/in 15years. Patient does not qualify. Breast:  Up to date on Mammogram? Yes   Up to date of Bone Density/Dexa? Yes Colorectal: up to date  Additional Screenings: : Hepatitis C Screening: 06/19/2012     Plan:    6 CIT was 0. Patient wants to get back on an exercise regimen.   I have personally reviewed and noted the following in the patient's chart:   . Medical and social history . Use of alcohol, tobacco or illicit drugs  . Current medications and supplements . Functional ability and status . Nutritional status . Physical activity . Advanced directives . List of other physicians . Hospitalizations, surgeries, and ER visits in previous 12 months . Vitals . Screenings to include cognitive, depression, and falls . Referrals and appointments  In addition, I have reviewed and discussed with patient certain preventive protocols, quality metrics, and best practice recommendations. A written personalized care plan for preventive services as well as general preventive health recommendations were provided to patient.     Kellie Simmering, LPN  4/[id number]/[id number]

## 2018-12-20 NOTE — Patient Instructions (Signed)
Nicole Nunez , Thank you for taking time to come for your Medicare Wellness Visit. I appreciate your ongoing commitment to your health goals. Please review the following plan we discussed and let me know if I can assist you in the future.   Screening recommendations/referrals: Colonoscopy: 11/2015 Mammogram: 12/2017 Bone Density: 03/2018 Recommended yearly ophthalmology/optometry visit for glaucoma screening and checkup Recommended yearly dental visit for hygiene and checkup  Vaccinations: Influenza vaccine: 01/2018 Pneumococcal vaccine: 04/2015 Tdap vaccine: 05/2011 Shingles vaccine: discussed    Advanced directives: Advance directive discussed with you today. Even though you declined this today please call our office should you change your mind and we can give you the proper paperwork for you to fill out.   Conditions/risks identified: obesity  Next appointment: 04/24/2019 at 11:45   Preventive Care 40 Years and Older, Female Preventive care refers to lifestyle choices and visits with your health care provider that can promote health and wellness. What does preventive care include?  A yearly physical exam. This is also called an annual well check.  Dental exams once or twice a year.  Routine eye exams. Ask your health care provider how often you should have your eyes checked.  Personal lifestyle choices, including:  Daily care of your teeth and gums.  Regular physical activity.  Eating a healthy diet.  Avoiding tobacco and drug use.  Limiting alcohol use.  Practicing safe sex.  Taking low-dose aspirin every day.  Taking vitamin and mineral supplements as recommended by your health care provider. What happens during an annual well check? The services and screenings done by your health care provider during your annual well check will depend on your age, overall health, lifestyle risk factors, and family history of disease. Counseling  Your health care provider may ask  you questions about your:  Alcohol use.  Tobacco use.  Drug use.  Emotional well-being.  Home and relationship well-being.  Sexual activity.  Eating habits.  History of falls.  Memory and ability to understand (cognition).  Work and work Statistician.  Reproductive health. Screening  You may have the following tests or measurements:  Height, weight, and BMI.  Blood pressure.  Lipid and cholesterol levels. These may be checked every 5 years, or more frequently if you are over 62 years old.  Skin check.  Lung cancer screening. You may have this screening every year starting at age 23 if you have a 30-pack-year history of smoking and currently smoke or have quit within the past 15 years.  Fecal occult blood test (FOBT) of the stool. You may have this test every year starting at age 65.  Flexible sigmoidoscopy or colonoscopy. You may have a sigmoidoscopy every 5 years or a colonoscopy every 10 years starting at age 61.  Hepatitis C blood test.  Hepatitis B blood test.  Sexually transmitted disease (STD) testing.  Diabetes screening. This is done by checking your blood sugar (glucose) after you have not eaten for a while (fasting). You may have this done every 1-3 years.  Bone density scan. This is done to screen for osteoporosis. You may have this done starting at age 73.  Mammogram. This may be done every 1-2 years. Talk to your health care provider about how often you should have regular mammograms. Talk with your health care provider about your test results, treatment options, and if necessary, the need for more tests. Vaccines  Your health care provider may recommend certain vaccines, such as:  Influenza vaccine. This is recommended every  year.  Tetanus, diphtheria, and acellular pertussis (Tdap, Td) vaccine. You may need a Td booster every 10 years.  Zoster vaccine. You may need this after age 37.  Pneumococcal 13-valent conjugate (PCV13) vaccine. One dose  is recommended after age 67.  Pneumococcal polysaccharide (PPSV23) vaccine. One dose is recommended after age 71. Talk to your health care provider about which screenings and vaccines you need and how often you need them. This information is not intended to replace advice given to you by your health care provider. Make sure you discuss any questions you have with your health care provider. Document Released: 06/05/2015 Document Revised: 01/27/2016 Document Reviewed: 03/10/2015 Elsevier Interactive Patient Education  2017 [employer] Prevention in the Home Falls can cause injuries. They can happen to people of all ages. There are many things you can do to make your home safe and to help prevent falls. What can I do on the outside of my home?  Regularly fix the edges of walkways and driveways and fix any cracks.  Remove anything that might make you trip as you walk through a door, such as a raised step or threshold.  Trim any bushes or trees on the path to your home.  Use bright outdoor lighting.  Clear any walking paths of anything that might make someone trip, such as rocks or tools.  Regularly check to see if handrails are loose or broken. Make sure that both sides of any steps have handrails.  Any raised decks and porches should have guardrails on the edges.  Have any leaves, snow, or ice cleared regularly.  Use sand or salt on walking paths during winter.  Clean up any spills in your garage right away. This includes oil or grease spills. What can I do in the bathroom?  Use night lights.  Install grab bars by the toilet and in the tub and shower. Do not use towel bars as grab bars.  Use non-skid mats or decals in the tub or shower.  If you need to sit down in the shower, use a plastic, non-slip stool.  Keep the floor dry. Clean up any water that spills on the floor as soon as it happens.  Remove soap buildup in the tub or shower regularly.  Attach bath mats  securely with double-sided non-slip rug tape.  Do not have throw rugs and other things on the floor that can make you trip. What can I do in the bedroom?  Use night lights.  Make sure that you have a light by your bed that is easy to reach.  Do not use any sheets or blankets that are too big for your bed. They should not hang down onto the floor.  Have a firm chair that has side arms. You can use this for support while you get dressed.  Do not have throw rugs and other things on the floor that can make you trip. What can I do in the kitchen?  Clean up any spills right away.  Avoid walking on wet floors.  Keep items that you use a lot in easy-to-reach places.  If you need to reach something above you, use a strong step stool that has a grab bar.  Keep electrical cords out of the way.  Do not use floor polish or wax that makes floors slippery. If you must use wax, use non-skid floor wax.  Do not have throw rugs and other things on the floor that can make you trip. What can  I do with my stairs?  Do not leave any items on the stairs.  Make sure that there are handrails on both sides of the stairs and use them. Fix handrails that are broken or loose. Make sure that handrails are as long as the stairways.  Check any carpeting to make sure that it is firmly attached to the stairs. Fix any carpet that is loose or worn.  Avoid having throw rugs at the top or bottom of the stairs. If you do have throw rugs, attach them to the floor with carpet tape.  Make sure that you have a light switch at the top of the stairs and the bottom of the stairs. If you do not have them, ask someone to add them for you. What else can I do to help prevent falls?  Wear shoes that:  Do not have high heels.  Have rubber bottoms.  Are comfortable and fit you well.  Are closed at the toe. Do not wear sandals.  If you use a stepladder:  Make sure that it is fully opened. Do not climb a closed  stepladder.  Make sure that both sides of the stepladder are locked into place.  Ask someone to hold it for you, if possible.  Clearly mark and make sure that you can see:  Any grab bars or handrails.  First and last steps.  Where the edge of each step is.  Use tools that help you move around (mobility aids) if they are needed. These include:  Canes.  Walkers.  Scooters.  Crutches.  Turn on the lights when you go into a dark area. Replace any light bulbs as soon as they burn out.  Set up your furniture so you have a clear path. Avoid moving your furniture around.  If any of your floors are uneven, fix them.  If there are any pets around you, be aware of where they are.  Review your medicines with your doctor. Some medicines can make you feel dizzy. This can increase your chance of falling. Ask your doctor what other things that you can do to help prevent falls. This information is not intended to replace advice given to you by your health care provider. Make sure you discuss any questions you have with your health care provider. Document Released: 03/05/2009 Document Revised: 10/15/2015 Document Reviewed: 06/13/2014 Elsevier Interactive Patient Education  2017 [employer].

## 2018-12-20 NOTE — Progress Notes (Signed)
Subjective:     Patient ID: Nicole Nunez , female    DOB: 1946-02-01 , 73 y.o.   MRN: [id number]   Chief Complaint  Patient presents with  . Annual Exam  . Diabetes  . Hypertension    HPI  She is here today for a full physical examination. She is no longer followed by GYN.   Diabetes She presents for her follow-up diabetic visit. She has type 2 diabetes mellitus. Her disease course has been stable. There are no hypoglycemic associated symptoms. Pertinent negatives for diabetes include no blurred vision and no chest pain. There are no hypoglycemic complications. She is following a diabetic diet. She has not had a previous visit with a dietitian. She participates in exercise three times a week. An ACE inhibitor/angiotensin II receptor blocker is being taken. Eye exam is current.  Hypertension This is a chronic problem. The current episode started more than 1 year ago. The problem has been gradually improving since onset. The problem is controlled. Pertinent negatives include no blurred vision, chest pain, palpitations or shortness of breath. The current treatment provides moderate improvement. Hypertensive end-organ damage includes kidney disease.     Past Medical History:  Diagnosis Date  . CKD stage 3 due to type 2 diabetes mellitus ([place])   . DM (diabetes mellitus) ([place])   . High cholesterol   . HTN (hypertension)      Family History  Problem Relation Age of Onset  . Asthma Mother   . Hypertension Mother   . Alzheimer's disease Mother   . Early death Father      Current Outpatient Medications:  .  aspirin EC 81 MG tablet, Take 81 mg by mouth daily., Disp: , Rfl:  .  Calcium Carbonate (CALCIUM 600 PO), Take 1 tablet by mouth., Disp: , Rfl:  .  Cholecalciferol (VITAMIN D3) 125 MCG (5000 UT) CAPS, Take 1 capsule by mouth., Disp: , Rfl:  .  Lancets (ONETOUCH DELICA PLUS [id number]) MISC, USE AS DIRECTED TO CHECK BLOOD SUGARS ONCE DAILY, Disp: 100 each, Rfl: 11 .   olmesartan (BENICAR) 20 MG tablet, TAKE 1 TABLET BY MOUTH EVERY DAY, Disp: 90 tablet, Rfl: 1 .  ONETOUCH VERIO test strip, USE AS DIRECTED TO CHECK BLOOD SUGARS ONCE D, Disp: , Rfl: 11 .  OZEMPIC, 0.25 OR 0.5 MG/DOSE, 2 MG/1.5ML SOPN, INJECT SUBCUTANEOUSLY 0.5MG ONCE A WEEK ON THURSDAY, Disp: 3 pen, Rfl: 1 .  pantoprazole (PROTONIX) 40 MG tablet, TAKE 1 TABLET(40 MG) BY MOUTH DAILY, Disp: 90 tablet, Rfl: 1 .  pioglitazone-metformin (ACTOPLUS MET) 15-500 MG tablet, TAKE 1 TABLET BY MOUTH  TWICE A DAY, Disp: 180 tablet, Rfl: 1 .  Pitavastatin Calcium 2 MG TABS, Take 1 tablet by mouth daily., Disp: , Rfl:    No Known Allergies   Review of Systems  Constitutional: Negative.   HENT: Negative.   Eyes: Negative.  Negative for blurred vision.  Respiratory: Negative.  Negative for shortness of breath.   Cardiovascular: Negative.  Negative for chest pain and palpitations.  Endocrine: Negative.   Genitourinary: Negative.   Musculoskeletal: Positive for arthralgias.       She c/o generalized joint pains  Skin: Negative.   Allergic/Immunologic: Negative.   Neurological: Negative.   Hematological: Negative.   Psychiatric/Behavioral: Negative.      Today's Vitals   12/20/18 1106  BP: 126/72  Pulse: 82  Temp: 98.6 F (37 C)  TempSrc: Oral  Weight: 159 lb 9.6 oz (72.4 kg)  Height: 4'  9.8" (1.468 m)  PainSc: 0-No pain   Body mass index is 33.59 kg/m.   Objective:  Physical Exam Vitals signs and nursing note reviewed.  Constitutional:      Appearance: Normal appearance.  HENT:     Head: Normocephalic and atraumatic.     Right Ear: Tympanic membrane, ear canal and external ear normal.     Left Ear: Tympanic membrane, ear canal and external ear normal.     Nose: Nose normal.     Mouth/Throat:     Mouth: Mucous membranes are moist.     Pharynx: Oropharynx is clear.  Eyes:     Extraocular Movements: Extraocular movements intact.     Conjunctiva/sclera: Conjunctivae normal.      Pupils: Pupils are equal, round, and reactive to light.  Neck:     Musculoskeletal: Normal range of motion and neck supple.  Cardiovascular:     Rate and Rhythm: Normal rate and regular rhythm.     Pulses: Normal pulses.          Dorsalis pedis pulses are 2+ on the right side and 2+ on the left side.       Posterior tibial pulses are 2+ on the right side and 2+ on the left side.     Heart sounds: Normal heart sounds.  Pulmonary:     Effort: Pulmonary effort is normal.     Breath sounds: Normal breath sounds.  Chest:     Breasts: Tanner Score is 5.        Right: Normal. No swelling, bleeding, inverted nipple, mass, nipple discharge or skin change.        Left: Normal. No swelling, bleeding, inverted nipple, mass, nipple discharge or skin change.  Abdominal:     General: Abdomen is flat. Bowel sounds are normal.     Palpations: Abdomen is soft.  Genitourinary:    Comments: deferred Musculoskeletal: Normal range of motion.  Feet:     Right foot:     Protective Sensation: 5 sites tested. 5 sites sensed.     Skin integrity: Skin integrity normal.     Toenail Condition: Right toenails are normal.     Left foot:     Protective Sensation: 5 sites tested. 5 sites sensed.     Skin integrity: Skin integrity normal.     Toenail Condition: Left toenails are normal.  Skin:    General: Skin is warm and dry.          Comments: left buttcheek - pedunculated, flesh-colored soft tissue appendage, 1cm  Neurological:     General: No focal deficit present.     Mental Status: She is alert and oriented to person, place, and time.  Psychiatric:        Mood and Affect: Mood normal.        Behavior: Behavior normal.         Assessment And Plan:     1. Routine general medical examination at health care facility  A full exam was performed. Importance of monthly self breast exams was discussed with the patient. PATIENT HAS BEEN ADVISED TO GET 30-45 MINUTES REGULAR EXERCISE NO LESS THAN FOUR TO  FIVE DAYS PER WEEK - BOTH WEIGHTBEARING EXERCISES AND AEROBIC ARE RECOMMENDED.  SHE IS ADVISED TO FOLLOW A HEALTHY DIET WITH AT LEAST SIX FRUITS/VEGGIES PER DAY, DECREASE INTAKE OF RED MEAT, AND TO INCREASE FISH INTAKE TO TWO DAYS PER WEEK.  MEATS/FISH SHOULD NOT BE FRIED, BAKED OR BROILED IS PREFERABLE.  I  SUGGEST WEARING SPF 50 SUNSCREEN ON EXPOSED PARTS AND ESPECIALLY WHEN IN THE DIRECT SUNLIGHT FOR AN EXTENDED PERIOD OF TIME.  PLEASE AVOID FAST FOOD RESTAURANTS AND INCREASE YOUR WATER INTAKE.   2. Type 2 diabetes mellitus with stage 3 chronic kidney disease, without long-term current use of insulin ([place])  Diabetic foot exam was performed.  I DISCUSSED WITH THE PATIENT AT LENGTH REGARDING THE GOALS OF GLYCEMIC CONTROL AND POSSIBLE LONG-TERM COMPLICATIONS.  I  ALSO STRESSED THE IMPORTANCE OF COMPLIANCE WITH HOME GLUCOSE MONITORING, DIETARY RESTRICTIONS INCLUDING AVOIDANCE OF SUGARY DRINKS/PROCESSED FOODS,  ALONG WITH REGULAR EXERCISE.  I  ALSO STRESSED THE IMPORTANCE OF ANNUAL EYE EXAMS, SELF FOOT CARE AND COMPLIANCE WITH OFFICE VISITS.  - CMP14+EGFR - CBC - Lipid panel - Hemoglobin A1c  3. Chronic renal disease, stage III ([facility])  Chronic. I will check a GFR, Cr today. She is encouraged to stay well hydrated. REVIEWED RECENT LAB RESULTS WITH PATIENT. PATIENT HAS BEEN IDENTIFIED AS AT RISK FOR CKD. WILL CONTINUE TO MONITOR, AND WILL REFER TO NEPHROLOGY IF PATIENT CONDITION CHANGES. PATIENT EDUCATED REGARDING NEPHROTOXIC MEDICATIONS, IMPORTANCE OF FLUID INTAKE. IN PATIENTS WITH DIABETES AND HYPERTENSION, EMPHASIS WILL BE PLACED ON GLYCEMIC CONTROL, BLOOD PRESSURE CONTROL, AND DIETARY PROTEIN RESTRICTION.  4. Parenchymal renal hypertension, stage 1 through stage 4 or unspecified chronic kidney disease  Well controlled. She will continue with current meds. She is encouraged to avoid adding salt to her foods. EKG performed, no acute changes noted.  - EKG 12-Lead  5. Primary osteoarthritis involving  multiple joints  Chronic. She is encouraged to remain active. She will take Tylenol prn. She will let me know if her sx worsen. She may benefit from an herbal supplement, Zyflamend taken prn.   6. Soft tissue mass  She does not wish to have this surgically excised at this time. She will let me know if this troubles her in the future.   7. Class 1 obesity due to excess calories with serious comorbidity and body mass index (BMI) of 33.0 to 33.9 in adult  Importance of achieving optimal weight to decrease risk of cardiovascular disease and cancers was discussed with the patient in full detail. She is encouraged to start slowly - start with 10 minutes twice daily at least three to four days per week and to gradually build to 30 minutes five days weekly. She was given tips to incorporate more activity into her daily routine - take stairs when possible, park farther away from grocery stores, etc.    Maximino Greenland, MD    THE PATIENT IS ENCOURAGED TO PRACTICE SOCIAL DISTANCING DUE TO THE COVID-19 PANDEMIC.

## 2018-12-21 LAB — CBC
Hematocrit: 34.8 % (ref 34.0–46.6)
Hemoglobin: 11.2 g/dL (ref 11.1–15.9)
MCH: 27.7 pg (ref 26.6–33.0)
MCHC: 32.2 g/dL (ref 31.5–35.7)
MCV: 86 fL (ref 79–97)
Platelets: 311 10*3/uL (ref 150–450)
RBC: 4.05 x10E6/uL (ref 3.77–5.28)
RDW: 13.3 % (ref 11.7–15.4)
WBC: 5 10*3/uL (ref 3.4–10.8)

## 2018-12-21 LAB — LIPID PANEL
Chol/HDL Ratio: 2.3 ratio (ref 0.0–4.4)
Cholesterol, Total: 157 mg/dL (ref 100–199)
HDL: 67 mg/dL (ref >39)
LDL Calculated: 79 mg/dL (ref 0–99)
Triglycerides: 54 mg/dL (ref 0–149)
VLDL Cholesterol Cal: 11 mg/dL (ref 5–40)

## 2018-12-21 LAB — CMP14+EGFR
ALT: 17 IU/L (ref 0–32)
AST: 24 IU/L (ref 0–40)
Albumin/Globulin Ratio: 1.9 (ref 1.2–2.2)
Albumin: 4.6 g/dL (ref 3.7–4.7)
Alkaline Phosphatase: 90 IU/L (ref 39–117)
BUN/Creatinine Ratio: 16 (ref 12–28)
BUN: 20 mg/dL (ref 8–27)
Bilirubin Total: 0.5 mg/dL (ref 0.0–1.2)
CO2: 23 mmol/L (ref 20–29)
Calcium: 9.7 mg/dL (ref 8.7–10.3)
Chloride: 104 mmol/L (ref 96–106)
Creatinine, Ser: 1.29 mg/dL — ABNORMAL HIGH (ref 0.57–1.00)
GFR calc Af Amer: 48 mL/min/{1.73_m2} — ABNORMAL LOW (ref >59)
GFR calc non Af Amer: 42 mL/min/{1.73_m2} — ABNORMAL LOW (ref >59)
Globulin, Total: 2.4 g/dL (ref 1.5–4.5)
Glucose: 103 mg/dL — ABNORMAL HIGH (ref 65–99)
Potassium: 4.3 mmol/L (ref 3.5–5.2)
Sodium: 144 mmol/L (ref 134–144)
Total Protein: 7 g/dL (ref 6.0–8.5)

## 2018-12-21 LAB — HEMOGLOBIN A1C
Est. average glucose Bld gHb Est-mCnc: 131 mg/dL
Hgb A1c MFr Bld: 6.2 % — ABNORMAL HIGH (ref 4.8–5.6)

## 2018-12-24 ENCOUNTER — Ambulatory Visit: Payer: Self-pay

## 2018-12-24 DIAGNOSIS — N183 Chronic kidney disease, stage 3 unspecified: Secondary | ICD-10-CM

## 2018-12-24 DIAGNOSIS — E1122 Type 2 diabetes mellitus with diabetic chronic kidney disease: Secondary | ICD-10-CM

## 2018-12-24 NOTE — Chronic Care Management (AMB) (Signed)
  Chronic Care Management   Outreach Note  12/24/2018 Name: BLANCHE SCOVELL MRN: [id number] DOB: [birth year]  Referred by: Glendale Chard, MD Reason for referral : Care Coordination   Third unsuccessful telephone outreach was attempted today. The patient was referred to the case management team for assistance with chronic care management and care coordination. The patient's primary care provider has been notified of our unsuccessful attempts to make or maintain contact with the patient. The care management team is pleased to engage with this patient at any time in the future should he/she be interested in assistance from the care management team.   Follow Up Plan: No further follow up required: CCM SW will be available to discuss enrollment if the patrient returns call.  Daneen Schick, BSW, CDP Social Worker, Certified Dementia Practitioner [facility] [phone]

## 2018-12-28 ENCOUNTER — Encounter: Payer: Self-pay | Admitting: Internal Medicine

## 2019-01-03 ENCOUNTER — Telehealth: Payer: Self-pay

## 2019-01-03 ENCOUNTER — Telehealth: Payer: Self-pay | Admitting: Internal Medicine

## 2019-01-03 NOTE — Chronic Care Management (AMB) (Signed)
Chronic Care Management   Note  01/03/2019 Name: Nicole Nunez MRN: [id number] DOB: [birth year]  Nicole Nunez is a 73 y.o. year old female who is a primary care patient of Glendale Chard, MD. I reached out to [name] by phone today in response to a referral sent by Nicole Nunez's health plan.    Ms. Rumpf was given information about Chronic Care Management services today including:  1. CCM service includes personalized support from designated clinical staff supervised by her physician, including individualized plan of care and coordination with other care providers 2. 24/7 contact phone numbers for assistance for urgent and routine care needs. 3. Service will only be billed when office clinical staff spend 20 minutes or more in a month to coordinate care. 4. Only one practitioner may furnish and bill the service in a calendar month. 5. The patient may stop CCM services at any time (effective at the end of the month) by phone call to the office staff. 6. The patient will be responsible for cost sharing (co-pay) of up to 20% of the service fee (after annual deductible is met).  Patient agreed to services and verbal consent obtained.   Follow up plan: Telephone appointment with CCM team member scheduled for: 01/22/2019  [place]  ??[email or web address]   ??[phone]

## 2019-01-17 ENCOUNTER — Ambulatory Visit
Admission: RE | Admit: 2019-01-17 | Discharge: 2019-01-17 | Disposition: A | Discharge status: Home / Self Care | Payer: Medicare Other | Source: Ambulatory Visit | Attending: Internal Medicine | Admitting: Internal Medicine

## 2019-01-17 ENCOUNTER — Other Ambulatory Visit: Payer: Self-pay

## 2019-01-17 DIAGNOSIS — Z1231 Encounter for screening mammogram for malignant neoplasm of breast: Secondary | ICD-10-CM | POA: Diagnosis not present

## 2019-01-21 ENCOUNTER — Other Ambulatory Visit: Payer: Self-pay | Admitting: Internal Medicine

## 2019-01-21 DIAGNOSIS — R928 Other abnormal and inconclusive findings on diagnostic imaging of breast: Secondary | ICD-10-CM

## 2019-01-22 ENCOUNTER — Telehealth: Payer: Medicare Other

## 2019-01-25 ENCOUNTER — Other Ambulatory Visit: Payer: Self-pay

## 2019-01-25 ENCOUNTER — Ambulatory Visit
Admission: RE | Admit: 2019-01-25 | Discharge: 2019-01-25 | Disposition: A | Discharge status: Home / Self Care | Payer: Medicare Other | Source: Ambulatory Visit | Attending: Internal Medicine | Admitting: Internal Medicine

## 2019-01-25 DIAGNOSIS — R928 Other abnormal and inconclusive findings on diagnostic imaging of breast: Secondary | ICD-10-CM | POA: Diagnosis not present

## 2019-01-25 DIAGNOSIS — N6489 Other specified disorders of breast: Secondary | ICD-10-CM | POA: Diagnosis not present

## 2019-01-30 DIAGNOSIS — E118 Type 2 diabetes mellitus with unspecified complications: Secondary | ICD-10-CM | POA: Diagnosis not present

## 2019-01-30 DIAGNOSIS — L84 Corns and callosities: Secondary | ICD-10-CM | POA: Diagnosis not present

## 2019-01-30 DIAGNOSIS — B351 Tinea unguium: Secondary | ICD-10-CM | POA: Diagnosis not present

## 2019-02-07 ENCOUNTER — Telehealth: Payer: Self-pay

## 2019-02-15 ENCOUNTER — Telehealth: Payer: Self-pay | Admitting: Internal Medicine

## 2019-02-15 ENCOUNTER — Telehealth: Payer: Self-pay

## 2019-02-15 NOTE — Telephone Encounter (Signed)
Left vm for pt to come pick up a sample of ozempic on Monday

## 2019-02-15 NOTE — Telephone Encounter (Signed)
-----   Message from Glendale Chard, MD sent at 02/15/2019 11:04 AM EDT ----- She wants sample for Ozempic. Have her come Monday to pick up. Please place referral to [facility] if not already done. Use diabetes code and medication management

## 2019-02-15 NOTE — Telephone Encounter (Signed)
Called patient 1134 am 02/15/19 she has a sample of ozempic

## 2019-02-18 ENCOUNTER — Other Ambulatory Visit: Payer: Self-pay

## 2019-02-18 MED ORDER — ONETOUCH VERIO VI STRP: ORAL_STRIP | 11 refills | Status: DC

## 2019-02-19 ENCOUNTER — Ambulatory Visit: Payer: Self-pay

## 2019-02-19 ENCOUNTER — Ambulatory Visit (INDEPENDENT_AMBULATORY_CARE_PROVIDER_SITE_OTHER): Payer: Medicare Other

## 2019-02-19 DIAGNOSIS — N183 Chronic kidney disease, stage 3 unspecified: Secondary | ICD-10-CM

## 2019-02-19 DIAGNOSIS — E1122 Type 2 diabetes mellitus with diabetic chronic kidney disease: Secondary | ICD-10-CM

## 2019-02-19 DIAGNOSIS — E7800 Pure hypercholesterolemia, unspecified: Secondary | ICD-10-CM | POA: Diagnosis not present

## 2019-02-19 NOTE — Chronic Care Management (AMB) (Signed)
Chronic Care Management     Social Work General Note  02/19/2019 Name: Nicole Nunez MRN: [id number] DOB: [birth year]  Nicole Nunez is a 73 y.o. year old female who is a primary care patient of Glendale Chard, MD. The CCM was consulted to assist the patient with chronic care management and care coordination..   [name] was contacted on 01/03/2019 by colleague Hillery Jacks who gave information about Chronic Care Management services including:  1. CCM service includes personalized support from designated clinical staff supervised by her physician, including individualized plan of care and coordination with other care providers 2. 24/7 contact phone numbers for assistance for urgent and routine care needs. 3. Service will only be billed when office clinical staff spend 20 minutes or more in a month to coordinate care. 4. Only one practitioner may furnish and bill the service in a calendar month. 5. The patient may stop CCM services at any time (effective at the end of the month) by phone call to the office staff. 6. The patient will be responsible for cost sharing (co-pay) of up to 20% of the service fee (after annual deductible is met).  Patient agreed to services and verbal consent was obtained as previously documented.   Review of patient status, including review of consultants reports, relevant laboratory and other test results, and collaboration with appropriate care team members and the patient's provider was performed as part of comprehensive patient evaluation and provision of chronic care management services.    I placed an initial outreach call to the patient to conduct a social determinants of health screen and obtained information related to the patients ability to independently manage chronic conditions.  SDOH (Social Determinants of Health) screening performed today. See Care Plan Entry related to challenges with: Housing resources to assist with future repair needs.   Outpatient Encounter Medications as of 02/19/2019  Medication Sig  . aspirin EC 81 MG tablet Take 81 mg by mouth daily.  . Calcium Carbonate (CALCIUM 600 PO) Take 1 tablet by mouth.  . Cholecalciferol (VITAMIN D3) 125 MCG (5000 UT) CAPS Take 1 capsule by mouth.  . Lancets (ONETOUCH DELICA PLUS [id number]) MISC USE AS DIRECTED TO CHECK BLOOD SUGARS ONCE DAILY  . olmesartan (BENICAR) 20 MG tablet TAKE 1 TABLET BY MOUTH EVERY DAY  . ONETOUCH VERIO test strip USE AS DIRECTED TO CHECK BLOOD SUGARS ONCE D  . OZEMPIC, 0.25 OR 0.5 MG/DOSE, 2 MG/1.5ML SOPN INJECT SUBCUTANEOUSLY 0.5MG ONCE A WEEK ON THURSDAY  . pantoprazole (PROTONIX) 40 MG tablet TAKE 1 TABLET(40 MG) BY MOUTH DAILY  . pioglitazone-metformin (ACTOPLUS MET) 15-500 MG tablet TAKE 1 TABLET BY MOUTH  TWICE A DAY  . Pitavastatin Calcium 2 MG TABS Take 1 tablet by mouth daily.   No facility-administered encounter medications on file as of 02/19/2019.     Goals Addressed            This Visit's Progress     Patient Stated   . "I want to learn about home repair options" (pt-stated)       Current Barriers:  . Financial constraints related to projected home repair needs . Limited knowledge of community resources available to address housing repair  Clinical Social Work Clinical Goal(s):  Marland Kitchen Over the next 45 days the patient will work with CCM SW to become more knowledgeable of community resources to assist with home repair.  CCM SW Interventions: . Patient interviewed and appropriate assessments performed . Determined the patient is interested in  future home repair resources as her home will need repairs in the future . Provided patient with information about housing loan options provided by [employer] [facility])  . Mailed the patient information on PTRC . Discussed plans with patient for ongoing care management follow up and provided patient with direct contact information for care management team . Scheduled follow  up call to the patient over the next 5 weeks to confirm receipt of resource   Patient Self Care Activities:  . Self administers medications as prescribed . Calls pharmacy for medication refills . Performs ADL's independently  Initial goal documentation        Other   . Assist with initial Chronic Care Management outreach and complete Social Determinants of Health Screen       Current Barriers:  Marland Kitchen Knowledge Barriers related to resources and support available to address needs related to Chronic Care Management and challenges surrounding Social Determinants of Health  Clinical Social Work Clinical Goal(s):   Over the next 5 days, the patient will understand the role of the CCM team and work with SW to complete SDOH (Social Determinants of Health) screen.  Over the next 30 days the patient will engage with CCM team to address needs in relation to diabetes and stage III renal disease.  Interventions:  Outbound call placed to the patient to introduce the Chronic Care Management program  Patient screened for SDOH (Social Determinants of Health)  Identified challenges with: No immediate needs but interested in learning about housing repair resources  Provided education about SW role and plan to address identified SDOH challenge(s)  Assessed for the patient's ability to verbalize current chronic medical conditions. The patient identifies current conditions as diabetes and kidney disease  Determined the patient has difficulty managing Diabetes due to medication management and adherence  Assessed for patient ability to afford prescribed medications. The patient identifies difficulty affording [place] with embedded PharmD regarding medication assistance  Collaboration with RN Case Manager regarding patient enrollment and above interventions  Scheduled initial assessment with RN Case Manager for 03/11/2019  Advised the patient to have medications ready to review   Patient Self Care Activities:   Patient currently unable to independently manage chronic conditions   Patient currently unable to identify community resources to assist with home repair   Initial goal documentation: See individual goal documentation for details related to specific interventions         Follow Up Plan: SW will follow up with patient by phone over the next 5-6 weeks.       Daneen Schick, BSW, CDP Social Worker, Certified Dementia Practitioner [place] [facility] [place] [facility] [phone]  Total time spent performing care coordination and/or care management activities with the patient by phone or face to face = 23 minutes.

## 2019-02-19 NOTE — Patient Instructions (Signed)
Social Worker Visit Information  Goals we discussed today:  Goals Addressed            This Visit's Progress     Patient Stated   . "I want to learn about home repair options" (pt-stated)       Current Barriers:  . Financial constraints related to projected home repair needs . Limited knowledge of community resources available to address housing repair  Clinical Social Work Clinical Goal(s):  Marland Kitchen Over the next 45 days the patient will work with CCM SW to become more knowledgeable of community resources to assist with home repair.  CCM SW Interventions: . Patient interviewed and appropriate assessments performed . Determined the patient is interested in future home repair resources as her home will need repairs in the future . Provided patient with information about housing loan options provided by [employer] [facility])  . Mailed the patient information on PTRC . Discussed plans with patient for ongoing care management follow up and provided patient with direct contact information for care management team . Scheduled follow up call to the patient over the next 5 weeks to confirm receipt of resource   Patient Self Care Activities:  . Self administers medications as prescribed . Calls pharmacy for medication refills . Performs ADL's independently  Initial goal documentation        Other   . Assist with initial Chronic Care Management outreach and complete Social Determinants of Health Screen       Current Barriers:  Marland Kitchen Knowledge Barriers related to resources and support available to address needs related to Chronic Care Management and challenges surrounding Social Determinants of Health  Clinical Social Work Clinical Goal(s):   Over the next 5 days, the patient will understand the role of the CCM team and work with SW to complete SDOH (Social Determinants of Health) screen.  Over the next 30 days the patient will engage with CCM team to address needs in  relation to diabetes and stage III renal disease.  Interventions:  Outbound call placed to the patient to introduce the Chronic Care Management program  Patient screened for SDOH (Social Determinants of Health)  Identified challenges with: No immediate needs but interested in learning about housing repair resources  Provided education about SW role and plan to address identified SDOH challenge(s)  Assessed for the patient's ability to verbalize current chronic medical conditions. The patient identifies current conditions as diabetes and kidney disease  Determined the patient has difficulty managing Diabetes due to medication management and adherence  Assessed for patient ability to afford prescribed medications. The patient identifies difficulty affording [place] with embedded PharmD regarding medication assistance  Collaboration with RN Case Manager regarding patient enrollment and above interventions  Scheduled initial assessment with RN Case Manager for 03/11/2019  Advised the patient to have medications ready to review  Patient Self Care Activities:   Patient currently unable to independently manage chronic conditions   Patient currently unable to identify community resources to assist with home repair   Initial goal documentation: See individual goal documentation for details related to specific interventions         Materials Provided: Yes: Mailed the patient information on community resource: [place]  Follow Up Plan: SW will follow up with patient by phone over the next 5-6 weeks.   Daneen Schick, BSW, CDP Social Worker, Certified Dementia Practitioner [place] [facility] [place] [facility] [phone]

## 2019-02-19 NOTE — Chronic Care Management (AMB) (Signed)
  Chronic Care Management   Initial Visit Note  02/19/2019 Name: NAAVYA POSTMA MRN: [id number] DOB: 07/26/1945  Referred by: Glendale Chard, MD Reason for referral : Chronic Care Management (CCM RNCM Case Collaboration )   RENARDA MULLINIX is a 73 y.o. year old female who is a primary care patient of Glendale Chard, MD. The care management team was consulted for assistance with chronic disease management and care coordination needs.   Review of patient status, including review of consultants reports, relevant laboratory and other test results, and collaboration with appropriate care team members and the patient's provider was performed as part of comprehensive patient evaluation and provision of chronic care management services.    I initiated and established the plan of care for DOMINIC RHOME during one on one collaboration with my clinical care management colleague Daneen Schick BSW who is also engaged with this patient to address social work needs.   Outpatient Encounter Medications as of 02/19/2019  Medication Sig  . aspirin EC 81 MG tablet Take 81 mg by mouth daily.  . Calcium Carbonate (CALCIUM 600 PO) Take 1 tablet by mouth.  . Cholecalciferol (VITAMIN D3) 125 MCG (5000 UT) CAPS Take 1 capsule by mouth.  . Lancets (ONETOUCH DELICA PLUS [id number]) MISC USE AS DIRECTED TO CHECK BLOOD SUGARS ONCE DAILY  . olmesartan (BENICAR) 20 MG tablet TAKE 1 TABLET BY MOUTH EVERY DAY  . ONETOUCH VERIO test strip USE AS DIRECTED TO CHECK BLOOD SUGARS ONCE D  . OZEMPIC, 0.25 OR 0.5 MG/DOSE, 2 MG/1.5ML SOPN INJECT SUBCUTANEOUSLY 0.'5MG'$  ONCE A WEEK ON THURSDAY  . pantoprazole (PROTONIX) 40 MG tablet TAKE 1 TABLET(40 MG) BY MOUTH DAILY  . pioglitazone-metformin (ACTOPLUS MET) 15-500 MG tablet TAKE 1 TABLET BY MOUTH  TWICE A DAY  . Pitavastatin Calcium 2 MG TABS Take 1 tablet by mouth daily.   No facility-administered encounter medications on file as of 02/19/2019.      Goals Addressed    .  Assist with Chronic Care Management and Care Coordination needs       Current Barriers:  Marland Kitchen Knowledge Barriers related to resources and support available to address needs related to Chronic disease management and Care coordination with medication cost assistance (Ozempic)  Case Manager Clinical Goal(s):  Marland Kitchen Over the next 30 days, patient will work with the CCM team to address needs related to Chronic disease management and Care coordination with medication cost assistance specifically Ozempic  Interventions:  . Collaborated with BSW and initiated plan of care to address needs related to chronic disease management and Care Coordination with medication cost assistance specific to Ozempic  Patient Self Care Activities:  . Self administers medications as prescribed . Attends all scheduled provider appointments . Calls provider office for new concerns or questions  Initial goal documentation         Telephone follow up appointment with care management team member scheduled for: 03/11/19  Barb Merino, RN, BSN, CCM Care Management Coordinator [place] [facility]  Direct Phone: [phone]

## 2019-02-26 ENCOUNTER — Ambulatory Visit: Payer: Self-pay | Admitting: Pharmacist

## 2019-02-26 DIAGNOSIS — E1122 Type 2 diabetes mellitus with diabetic chronic kidney disease: Secondary | ICD-10-CM

## 2019-02-26 DIAGNOSIS — N183 Chronic kidney disease, stage 3 unspecified: Secondary | ICD-10-CM

## 2019-02-26 DIAGNOSIS — E7800 Pure hypercholesterolemia, unspecified: Secondary | ICD-10-CM

## 2019-02-26 NOTE — Progress Notes (Signed)
  Chronic Care Management   Outreach Note  02/26/2019 Name: Nicole Nunez MRN: [id number] DOB: May 23, 1946  Referred by: Glendale Chard, MD Reason for referral : Chronic Care Management   An unsuccessful telephone outreach was attempted today. The patient was referred to the case management team by for assistance with care management and care coordination.   Follow Up Plan: A HIPPA compliant phone message was left for the patient providing contact information and requesting a return call.  The care management team will reach out to the patient again over the next 5 business  days.    Regina Eck, PharmD, BCPS Clinical Pharmacist, [facility] [place]: [phone]

## 2019-02-28 ENCOUNTER — Telehealth: Payer: Self-pay

## 2019-03-11 ENCOUNTER — Telehealth: Payer: Self-pay

## 2019-03-11 ENCOUNTER — Ambulatory Visit: Payer: Self-pay

## 2019-03-11 DIAGNOSIS — E1122 Type 2 diabetes mellitus with diabetic chronic kidney disease: Secondary | ICD-10-CM

## 2019-03-11 DIAGNOSIS — N183 Chronic kidney disease, stage 3 unspecified: Secondary | ICD-10-CM

## 2019-03-11 DIAGNOSIS — E7800 Pure hypercholesterolemia, unspecified: Secondary | ICD-10-CM

## 2019-03-11 NOTE — Chronic Care Management (AMB) (Signed)
  Chronic Care Management   Outreach Note  03/11/2019 Name: Nicole Nunez MRN: [id number] DOB: 10/19/1945  Referred by: Glendale Chard, MD Reason for referral : Chronic Care Management (INITIAL CCM RNCM Telephone Outreach )   An unsuccessful telephone outreach was attempted today. The patient was referred to the case management team by Glendale Chard MD for assistance with care management and care coordination.   Follow Up Plan: Telephone follow up appointment with care management team member scheduled for:04/01/19  Barb Merino, RN, BSN, CCM Care Management Coordinator [place] [facility]  Direct Phone: [phone]

## 2019-03-20 ENCOUNTER — Telehealth: Payer: Self-pay

## 2019-03-20 NOTE — Telephone Encounter (Signed)
The pt was notified that samples of ozempic and livalo is ready for pickup.

## 2019-03-25 ENCOUNTER — Other Ambulatory Visit: Payer: Self-pay | Admitting: Internal Medicine

## 2019-03-26 ENCOUNTER — Ambulatory Visit: Payer: Self-pay

## 2019-03-26 ENCOUNTER — Telehealth: Payer: Self-pay

## 2019-03-26 ENCOUNTER — Ambulatory Visit (INDEPENDENT_AMBULATORY_CARE_PROVIDER_SITE_OTHER): Payer: Medicare Other | Admitting: Pharmacist

## 2019-03-26 ENCOUNTER — Ambulatory Visit: Payer: Self-pay | Admitting: Pharmacist

## 2019-03-26 DIAGNOSIS — N183 Chronic kidney disease, stage 3 unspecified: Secondary | ICD-10-CM

## 2019-03-26 DIAGNOSIS — E1122 Type 2 diabetes mellitus with diabetic chronic kidney disease: Secondary | ICD-10-CM | POA: Diagnosis not present

## 2019-03-26 DIAGNOSIS — E7800 Pure hypercholesterolemia, unspecified: Secondary | ICD-10-CM | POA: Diagnosis not present

## 2019-03-26 NOTE — Chronic Care Management (AMB) (Signed)
  Chronic Care Management   Outreach Note  03/26/2019 Name: Nicole Nunez MRN: [id number] DOB: Oct 05, 1945  Referred by: Glendale Chard, MD Reason for referral : Care Coordination   SW placed an unsuccessful outreach to the patient to confirm receipt of mailed resources. SW left a HIPAA compliant voice message requesting a return call.  Follow Up Plan: The care management team will reach out to the patient again over the next 10 days.   Daneen Schick, BSW, CDP Social Worker, Certified Dementia Practitioner [place] [facility] [place] [facility] [phone]

## 2019-03-26 NOTE — Progress Notes (Signed)
  Chronic Care Management   Outreach Note  03/26/2019 Name: Nicole Nunez MRN: [id number] DOB: 11-10-1945  Referred by: Glendale Chard, MD Reason for referral : Chronic Care Management   An unsuccessful telephone outreach was attempted today. The patient was referred to the case management team by for assistance with care management and care coordination.   Follow Up Plan: A HIPPA compliant phone message was left for the patient providing contact information and requesting a return call.  The care management team will reach out to the patient again over the next 5-7 business days.   SIGNATURE Regina Eck, PharmD, BCPS Clinical Pharmacist, [facility] [place]: [phone]

## 2019-03-27 ENCOUNTER — Ambulatory Visit: Payer: Self-pay | Admitting: Pharmacist

## 2019-03-27 DIAGNOSIS — N183 Chronic kidney disease, stage 3 unspecified: Secondary | ICD-10-CM

## 2019-03-27 DIAGNOSIS — E7800 Pure hypercholesterolemia, unspecified: Secondary | ICD-10-CM

## 2019-03-27 DIAGNOSIS — E1122 Type 2 diabetes mellitus with diabetic chronic kidney disease: Secondary | ICD-10-CM

## 2019-03-28 ENCOUNTER — Other Ambulatory Visit: Payer: Self-pay | Admitting: Pharmacy Technician

## 2019-03-28 NOTE — Patient Outreach (Signed)
[facility]) Care Management  03/28/2019  ARADIA TORMEY 1946-02-01 [id number]   Received patient and provider portion(s) of patient assistance application(s) for Ozempic. Faxed completed application and required documents into [employer].  Will follow up with company(ies) in 5-7 business days to check status of application(s).  Maud Deed Chana Bode [place] Certified Pharmacy Technician [place] [facility] Direct [phone]

## 2019-03-28 NOTE — Progress Notes (Signed)
  Chronic Care Management   Outreach Note  03/26/2019 Name: Nicole Nunez MRN: [id number] DOB: 1946/04/27  Referred by: Glendale Chard, MD Reason for referral : CCM-Diabetes   Follow Up Plan: Face to Face appointment with care management team member scheduled for: 03/27/19  Regina Eck, PharmD, [place] Pharmacist, [facility] [place]: [phone]

## 2019-04-01 ENCOUNTER — Telehealth: Payer: Self-pay

## 2019-04-01 NOTE — Patient Instructions (Signed)
Visit Information  Goals Addressed            This Visit's Progress     Patient Stated   . I would like to apply for financial assistance (pt-stated)       Current Barriers:  . Financial Barriers: patient has [facility] insurance and reports copay for Ozempic is cost prohibitive at this time   Pharmacist Clinical Goal(s):  Marland Kitchen Over the next 30 days, patient will work with PharmD and providers to relieve medication access concerns  Interventions: . Comprehensive medication review completed; medication list updated in electronic medical record.  [name] by [employer]: Patient meets income/No out of pocket spend criteria for this medication's patient assistance program. Reviewed application process. Patient will provide proof of income, out of pocket spend report, and will sign application. Will collaborate with primary care provider, Dr. Glendale Chard for their portion of application. Once completed, will submit to [employer] patient assistance program.  Patient Self Care Activities:  . Patient will provide necessary portions of application   Initial goal documentation     . I would like to manage my diabetes (pt-stated)       Current Barriers:  . Diabetes: T2DM; most recent A1c 6.2% on 12/20/18  . Current antihyperglycemic regimen: Ozempic 0.5mg  weekly, actos plus . denies hypoglycemic symptoms; denies hyperglycemic symptoms . Current meal patterns: patient reports compliance with DM diet, she works to decrease her carbohydrate intake; avoids sugary drinks, etc . Current exercise: walking . Current blood glucose readings: FBG 90-100 . Cardiovascular risk reduction: o Current hypertensive regimen: olmesartan o Current hyperlipidemia regimen: pitavastatin (only statin patient will take-has been using samples due to cost)  Pharmacist Clinical Goal(s):  Marland Kitchen Over the next 90 days, patient with work with PharmD and primary care provider to address needs related to optimized medication management  of chronic conditions  Interventions: . Comprehensive medication review performed, medication list updated in electronic medical record . Reviewed & discussed the following diabetes-related information with patient: o Continue checking blood sugars as directed o Follow ADA recommended "diabetes-friendly" diet  (reviewed healthy snack/food options) o Discussed GLP-1 injection technique; Patient uses One touch verio glucometer o Reviewed medication purpose/side effects-->patient denies adverse events o  Patient Self Care Activities:  . Patient will check blood glucose  daily in the AM , document, and provide at future appointments . Patient will focus on medication adherence by continuing to take medications as prescribed; applying for financial assistance . Patient will take medications as prescribed . Patient will contact provider with any episodes of hypoglycemia . Patient will report any questions or concerns to provider   Initial goal documentation        The patient verbalized understanding of instructions provided today and declined a print copy of patient instruction materials.   The care management team will reach out to the patient again over the next 30 days.   SIGNATURE Regina Eck, PharmD, BCPS Clinical Pharmacist, [facility] [place]: [phone]

## 2019-04-01 NOTE — Progress Notes (Signed)
Chronic Care Management   Initial Visit Note  03/27/2019 Name: Nicole Nunez MRN: [id number] DOB: 05-Mar-1946  Referred by: Glendale Chard, MD Reason for referral : Chronic Care Management   Nicole Nunez is a 73 y.o. year old female who is a primary care patient of Glendale Chard, MD. The CCM team was consulted for assistance with chronic disease management and care coordination needs related to HLD and DMII  Review of patient status, including review of consultants reports, relevant laboratory and other test results, and collaboration with appropriate care team members and the patient's provider was performed as part of comprehensive patient evaluation and provision of chronic care management services.    I met with Nicole Nunez in clinic today.  Medications: Outpatient Encounter Medications as of 03/27/2019  Medication Sig  . Coenzyme Q10 100 MG TABS Take 1 tablet by mouth daily.  Marland Kitchen aspirin EC 81 MG tablet Take 81 mg by mouth daily.  . Calcium Carbonate (CALCIUM 600 PO) Take 1 tablet by mouth.  . Cholecalciferol (VITAMIN D3) 125 MCG (5000 UT) CAPS Take 1 capsule by mouth.  . Lancets (ONETOUCH DELICA PLUS [id number]) MISC USE AS DIRECTED TO CHECK BLOOD SUGARS ONCE DAILY  . olmesartan (BENICAR) 20 MG tablet TAKE 1 TABLET BY MOUTH  DAILY  . ONETOUCH VERIO test strip USE AS DIRECTED TO CHECK BLOOD SUGARS ONCE D  . OZEMPIC, 0.25 OR 0.5 MG/DOSE, 2 MG/1.5ML SOPN INJECT SUBCUTANEOUSLY 0.5MG ONCE A WEEK ON THURSDAY  . pantoprazole (PROTONIX) 40 MG tablet TAKE 1 TABLET(40 MG) BY MOUTH DAILY  . pioglitazone-metformin (ACTOPLUS MET) 15-500 MG tablet TAKE 1 TABLET BY MOUTH  TWICE A DAY  . Pitavastatin Calcium 2 MG TABS Take 1 tablet by mouth daily.   No facility-administered encounter medications on file as of 03/27/2019.      Objective:   Goals Addressed            This Visit's Progress     Patient Stated   . I would like to apply for financial assistance (pt-stated)      Current Barriers:  . Financial Barriers: patient has [facility] insurance and reports copay for Ozempic is cost prohibitive at this time   Pharmacist Clinical Goal(s):  Marland Kitchen Over the next 30 days, patient will work with PharmD and providers to relieve medication access concerns  Interventions: . Comprehensive medication review completed; medication list updated in electronic medical record.  [name] by [employer]: Patient meets income/No out of pocket spend criteria for this medication's patient assistance program. Reviewed application process. Patient will provide proof of income, out of pocket spend report, and will sign application. Will collaborate with primary care provider, Dr. Glendale Chard for their portion of application. Once completed, will submit to [employer] patient assistance program.  Patient Self Care Activities:  . Patient will provide necessary portions of application   Initial goal documentation     . I would like to manage my diabetes (pt-stated)       Current Barriers:  . Diabetes: T2DM; most recent A1c 6.2% on 12/20/18  . Current antihyperglycemic regimen: Ozempic 0.88m weekly, actos plus . denies hypoglycemic symptoms; denies hyperglycemic symptoms . Current meal patterns: patient reports compliance with DM diet, she works to decrease her carbohydrate intake; avoids sugary drinks, etc . Current exercise: walking . Current blood glucose readings: FBG 90-100 . Cardiovascular risk reduction: o Current hypertensive regimen: olmesartan o Current hyperlipidemia regimen: pitavastatin (only statin patient will take-has been using samples due to cost)  Pharmacist Clinical Goal(s):  Marland Kitchen Over the next 90 days, patient with work with PharmD and primary care provider to address needs related to optimized medication management of chronic conditions  Interventions: . Comprehensive medication review performed, medication list updated in electronic medical record . Reviewed & discussed the  following diabetes-related information with patient: o Continue checking blood sugars as directed o Follow ADA recommended "diabetes-friendly" diet  (reviewed healthy snack/food options) o Discussed GLP-1 injection technique; Patient uses One touch verio glucometer o Reviewed medication purpose/side effects-->patient denies adverse events o  Patient Self Care Activities:  . Patient will check blood glucose  daily in the AM , document, and provide at future appointments . Patient will focus on medication adherence by continuing to take medications as prescribed; applying for financial assistance . Patient will take medications as prescribed . Patient will contact provider with any episodes of hypoglycemia . Patient will report any questions or concerns to provider   Initial goal documentation         Plan:   The care management team will reach out to the patient again over the next 30 days.   Provider Signature Regina Eck, PharmD, BCPS Clinical Pharmacist, [facility] [place]: [phone]

## 2019-04-03 ENCOUNTER — Other Ambulatory Visit: Payer: Self-pay | Admitting: Pharmacy Technician

## 2019-04-03 NOTE — Patient Outreach (Signed)
[facility]) Care Management  04/03/2019  Nicole Nunez August 10, 1945 [id number]    Follow up call placed to [employer] regarding patient assistance application(s) for [name] confirms patient has been approved as of 11/9 until 05/23/19. 5 boxes of medication to be delivered to providers office in 10-14 business days, order [id number].  Follow up:  Will route note to Andrews and Sydnee Cabal B @ [facility] to inform.  Maud Deed Chana Bode [place] Certified Pharmacy Technician [place] Management Direct Dial:[phone]

## 2019-04-05 ENCOUNTER — Telehealth: Payer: Self-pay

## 2019-04-05 NOTE — Telephone Encounter (Signed)
The pt was notified that her Ozempic from [employer] patient assistance program has been delivered to the office.

## 2019-04-11 ENCOUNTER — Ambulatory Visit: Payer: Self-pay

## 2019-04-11 ENCOUNTER — Telehealth: Payer: Self-pay

## 2019-04-11 DIAGNOSIS — N183 Chronic kidney disease, stage 3 unspecified: Secondary | ICD-10-CM

## 2019-04-11 DIAGNOSIS — E1122 Type 2 diabetes mellitus with diabetic chronic kidney disease: Secondary | ICD-10-CM

## 2019-04-11 NOTE — Chronic Care Management (AMB) (Signed)
  Chronic Care Management   Outreach Note  04/11/2019 Name: Nicole Nunez MRN: [id number] DOB: 11-15-1945  Referred by: Glendale Chard, MD Reason for referral : Care Coordination   SW placed a second unsuccessful outbound call to the patient to confirm receipt of mailed resources. SW left a HIPAA compliant voice message requesting a return call.  Follow Up Plan: SW will attempt a third and final outreach attempt over the next three weeks.  Daneen Schick, BSW, CDP Social Worker, Certified Dementia Practitioner [place] / [place] Management [phone]

## 2019-04-24 ENCOUNTER — Encounter: Payer: Self-pay | Admitting: Internal Medicine

## 2019-04-24 ENCOUNTER — Ambulatory Visit (INDEPENDENT_AMBULATORY_CARE_PROVIDER_SITE_OTHER): Payer: Medicare Other | Admitting: Internal Medicine

## 2019-04-24 ENCOUNTER — Other Ambulatory Visit: Payer: Self-pay

## 2019-04-24 VITALS — BP 110/72 | HR 82 | Temp 98.8°F | Ht <= 58 in | Wt 159.8 lb

## 2019-04-24 DIAGNOSIS — E6609 Other obesity due to excess calories: Secondary | ICD-10-CM

## 2019-04-24 DIAGNOSIS — M549 Dorsalgia, unspecified: Secondary | ICD-10-CM | POA: Diagnosis not present

## 2019-04-24 DIAGNOSIS — E118 Type 2 diabetes mellitus with unspecified complications: Secondary | ICD-10-CM | POA: Diagnosis not present

## 2019-04-24 DIAGNOSIS — G8929 Other chronic pain: Secondary | ICD-10-CM

## 2019-04-24 DIAGNOSIS — Z6833 Body mass index (BMI) 33.0-33.9, adult: Secondary | ICD-10-CM

## 2019-04-24 DIAGNOSIS — E1122 Type 2 diabetes mellitus with diabetic chronic kidney disease: Secondary | ICD-10-CM

## 2019-04-24 DIAGNOSIS — N183 Chronic kidney disease, stage 3 unspecified: Secondary | ICD-10-CM

## 2019-04-24 DIAGNOSIS — I129 Hypertensive chronic kidney disease with stage 1 through stage 4 chronic kidney disease, or unspecified chronic kidney disease: Secondary | ICD-10-CM

## 2019-04-24 DIAGNOSIS — L84 Corns and callosities: Secondary | ICD-10-CM | POA: Diagnosis not present

## 2019-04-24 DIAGNOSIS — B351 Tinea unguium: Secondary | ICD-10-CM | POA: Diagnosis not present

## 2019-04-24 NOTE — Progress Notes (Signed)
This visit occurred during the SARS-CoV-2 public health emergency.  Safety protocols were in place, including screening questions prior to the visit, additional usage of staff PPE, and extensive cleaning of exam room while observing appropriate contact time as indicated for disinfecting solutions.  Subjective:     Patient ID: Nicole Nunez , female    DOB: 07-23-1945 , 73 y.o.   MRN: [id number]   Chief Complaint  Patient presents with  . Diabetes  . Hypertension    HPI  Diabetes She presents for her follow-up diabetic visit. She has type 2 diabetes mellitus. Her disease course has been stable. There are no hypoglycemic associated symptoms. Pertinent negatives for diabetes include no blurred vision and no chest pain. There are no hypoglycemic complications. Current diabetic treatment includes oral agent (dual therapy). She is compliant with treatment most of the time. She is following a diabetic diet. She participates in exercise intermittently. An ACE inhibitor/angiotensin II receptor blocker is being taken. Eye exam is current.  Hypertension This is a chronic problem. The current episode started more than 1 year ago. The problem has been gradually improving since onset. The problem is controlled. Pertinent negatives include no blurred vision, chest pain, palpitations or shortness of breath. Hypertensive end-organ damage includes kidney disease.     Past Medical History:  Diagnosis Date  . CKD stage 3 due to type 2 diabetes mellitus ([place])   . DM (diabetes mellitus) ([place])   . High cholesterol   . HTN (hypertension)      Family History  Problem Relation Age of Onset  . Asthma Mother   . Hypertension Mother   . Alzheimer's disease Mother   . Early death Father      Current Outpatient Medications:  .  aspirin EC 81 MG tablet, Take 81 mg by mouth daily., Disp: , Rfl:  .  Calcium Carbonate (CALCIUM 600 PO), Take 1 tablet by mouth., Disp: , Rfl:  .  Cholecalciferol (VITAMIN D3)  125 MCG (5000 UT) CAPS, Take 1 capsule by mouth., Disp: , Rfl:  .  Coenzyme Q10 100 MG TABS, Take 1 tablet by mouth daily., Disp: , Rfl:  .  Lancets (ONETOUCH DELICA PLUS [id number]) MISC, USE AS DIRECTED TO CHECK BLOOD SUGARS ONCE DAILY, Disp: 100 each, Rfl: 11 .  olmesartan (BENICAR) 20 MG tablet, TAKE 1 TABLET BY MOUTH  DAILY, Disp: 90 tablet, Rfl: 0 .  ONETOUCH VERIO test strip, USE AS DIRECTED TO CHECK BLOOD SUGARS ONCE D, Disp: 100 each, Rfl: 11 .  OZEMPIC, 0.25 OR 0.5 MG/DOSE, 2 MG/1.5ML SOPN, INJECT SUBCUTANEOUSLY 0.5MG ONCE A WEEK ON THURSDAY, Disp: 3 pen, Rfl: 1 .  pantoprazole (PROTONIX) 40 MG tablet, TAKE 1 TABLET(40 MG) BY MOUTH DAILY, Disp: 90 tablet, Rfl: 1 .  pioglitazone-metformin (ACTOPLUS MET) 15-500 MG tablet, TAKE 1 TABLET BY MOUTH  TWICE A DAY, Disp: 180 tablet, Rfl: 1 .  Pitavastatin Calcium 2 MG TABS, Take 1 tablet by mouth daily., Disp: , Rfl:    No Known Allergies   Review of Systems  Constitutional: Negative.   Eyes: Negative for blurred vision.  Respiratory: Negative.  Negative for shortness of breath.   Cardiovascular: Negative.  Negative for chest pain and palpitations.  Gastrointestinal: Negative.   Musculoskeletal: Positive for back pain.       States she had flare of back pain over the weekend. Thinks she may have "done too much" in preparation for Thanksgiving. She has not been able to tolerate muscle relaxers in the  past. She reports they "change her mood". She denies LE weakness/paresthesias.   Neurological: Negative.   Psychiatric/Behavioral: Negative.      Today's Vitals   04/24/19 1149  BP: 110/72  Pulse: 82  Temp: 98.8 F (37.1 C)  TempSrc: Oral  Weight: 159 lb 12.8 oz (72.5 kg)  Height: 4' 9.8" (1.468 m)  PainSc: 0-No pain   Body mass index is 33.63 kg/m.   Objective:  Physical Exam Vitals signs and nursing note reviewed.  Constitutional:      Appearance: Normal appearance.  HENT:     Head: Normocephalic and atraumatic.   Cardiovascular:     Rate and Rhythm: Normal rate and regular rhythm.     Heart sounds: Normal heart sounds.  Pulmonary:     Effort: Pulmonary effort is normal.     Breath sounds: Normal breath sounds.  Musculoskeletal:        General: Tenderness present.     Lumbar back: She exhibits tenderness.  Skin:    General: Skin is warm.  Neurological:     General: No focal deficit present.     Mental Status: She is alert.  Psychiatric:        Mood and Affect: Mood normal.        Behavior: Behavior normal.         Assessment And Plan:     1. Type 2 diabetes mellitus with stage 3 chronic kidney disease, without long-term current use of insulin, unspecified whether stage 3a or 3b CKD (HCC)  Chronic. I will check labs as listed below. She is encouraged to exercise 150 minutes per week at a minimum.   - Hemoglobin A1c - BMP8+EGFR  2. 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 She will continue with current meds. She is encouraged to avoid adding salt to her foods.   3. Other chronic back pain  She is advised to take magnesium nightly. Also encouraged to use OTC lidocaine patches as needed. She will let me know if her sx persist.   4. Class 1 obesity due to excess calories with serious comorbidity and body mass index (BMI) of 33.0 to 33.9 in adult  Importance of achieving optimal weight to decrease risk of cardiovascular disease and cancers was discussed with the patient in full detail. She is encouraged to start slowly - start with 10 minutes twice daily at least three to four days per week and to gradually build to 30 minutes five days weekly. She was given tips to incorporate more activity into her daily routine - take stairs when possible, park farther away from grocery stores, etc.     Maximino Greenland, MD    THE PATIENT IS ENCOURAGED TO PRACTICE SOCIAL DISTANCING DUE TO THE COVID-19 PANDEMIC.

## 2019-04-24 NOTE — Patient Instructions (Signed)
Diabetes Mellitus and Exercise Exercising regularly is important for your overall health, especially when you have diabetes (diabetes mellitus). Exercising is not only about losing weight. It has many other health benefits, such as increasing muscle strength and bone density and reducing body fat and stress. This leads to improved fitness, flexibility, and endurance, all of which result in better overall health. Exercise has additional benefits for people with diabetes, including:  Reducing appetite.  Helping to lower and control blood glucose.  Lowering blood pressure.  Helping to control amounts of fatty substances (lipids) in the blood, such as cholesterol and triglycerides.  Helping the body to respond better to insulin (improving insulin sensitivity).  Reducing how much insulin the body needs.  Decreasing the risk for heart disease by: ? Lowering cholesterol and triglyceride levels. ? Increasing the levels of good cholesterol. ? Lowering blood glucose levels. What is my activity plan? Your health care provider or certified diabetes educator can help you make a plan for the type and frequency of exercise (activity plan) that works for you. Make sure that you:  Do at least 150 minutes of moderate-intensity or vigorous-intensity exercise each week. This could be brisk walking, biking, or water aerobics. ? Do stretching and strength exercises, such as yoga or weightlifting, at least 2 times a week. ? Spread out your activity over at least 3 days of the week.  Get some form of physical activity every day. ? Do not go more than 2 days in a row without some kind of physical activity. ? Avoid being inactive for more than 30 minutes at a time. Take frequent breaks to walk or stretch.  Choose a type of exercise or activity that you enjoy, and set realistic goals.  Start slowly, and gradually increase the intensity of your exercise over time. What do I need to know about managing my  diabetes?   Check your blood glucose before and after exercising. ? If your blood glucose is 240 mg/dL (13.3 mmol/L) or higher before you exercise, check your urine for ketones. If you have ketones in your urine, do not exercise until your blood glucose returns to normal. ? If your blood glucose is 100 mg/dL (5.6 mmol/L) or lower, eat a snack containing 15-20 grams of carbohydrate. Check your blood glucose 15 minutes after the snack to make sure that your level is above 100 mg/dL (5.6 mmol/L) before you start your exercise.  Know the symptoms of low blood glucose (hypoglycemia) and how to treat it. Your risk for hypoglycemia increases during and after exercise. Common symptoms of hypoglycemia can include: ? Hunger. ? Anxiety. ? Sweating and feeling clammy. ? Confusion. ? Dizziness or feeling light-headed. ? Increased heart rate or palpitations. ? Blurry vision. ? Tingling or numbness around the mouth, lips, or tongue. ? Tremors or shakes. ? Irritability.  Keep a rapid-acting carbohydrate snack available before, during, and after exercise to help prevent or treat hypoglycemia.  Avoid injecting insulin into areas of the body that are going to be exercised. For example, avoid injecting insulin into: ? The arms, when playing tennis. ? The legs, when jogging.  Keep records of your exercise habits. Doing this can help you and your health care provider adjust your diabetes management plan as needed. Write down: ? Food that you eat before and after you exercise. ? Blood glucose levels before and after you exercise. ? The type and amount of exercise you have done. ? When your insulin is expected to peak, if you use   insulin. Avoid exercising at times when your insulin is peaking.  When you start a new exercise or activity, work with your health care provider to make sure the activity is safe for you, and to adjust your insulin, medicines, or food intake as needed.  Drink plenty of water while  you exercise to prevent dehydration or heat stroke. Drink enough fluid to keep your urine clear or pale yellow. Summary  Exercising regularly is important for your overall health, especially when you have diabetes (diabetes mellitus).  Exercising has many health benefits, such as increasing muscle strength and bone density and reducing body fat and stress.  Your health care provider or certified diabetes educator can help you make a plan for the type and frequency of exercise (activity plan) that works for you.  When you start a new exercise or activity, work with your health care provider to make sure the activity is safe for you, and to adjust your insulin, medicines, or food intake as needed. This information is not intended to replace advice given to you by your health care provider. Make sure you discuss any questions you have with your health care provider. Document Released: 07/30/2003 Document Revised: 12/01/2016 Document Reviewed: 10/19/2015 Elsevier Patient Education  2020 Elsevier Inc.  

## 2019-04-25 ENCOUNTER — Ambulatory Visit: Payer: Self-pay

## 2019-04-25 ENCOUNTER — Telehealth: Payer: Self-pay

## 2019-04-25 DIAGNOSIS — E1122 Type 2 diabetes mellitus with diabetic chronic kidney disease: Secondary | ICD-10-CM

## 2019-04-25 DIAGNOSIS — N183 Chronic kidney disease, stage 3 unspecified: Secondary | ICD-10-CM

## 2019-04-25 LAB — HEMOGLOBIN A1C
Est. average glucose Bld gHb Est-mCnc: 137 mg/dL
Hgb A1c MFr Bld: 6.4 % — ABNORMAL HIGH (ref 4.8–5.6)

## 2019-04-25 NOTE — Chronic Care Management (AMB) (Signed)
Chronic Care Management   Social Work Follow Up Note  04/25/2019 Name: Nicole Nunez MRN: [id number] DOB: 1945/11/17  Nicole Nunez is a 73 y.o. year old female who is a primary care patient of Glendale Chard, MD. The CCM team was consulted for assistance with care coordination.   Review of patient status, including review of consultants reports, other relevant assessments, and collaboration with appropriate care team members and the patient's provider was performed as part of comprehensive patient evaluation and provision of chronic care management services.    SW placed a third unsuccessful outbound call to the patient to assess progression of patient stated SW goal. SW left a HIPAA compliant voice message requesting a return call. SW has completed patient SW goals due to an inability to maintain contact and assess progression.    Goals Addressed            This Visit's Progress     Patient Stated   . COMPLETED: "I want to learn about home repair options" (pt-stated)       Current Barriers:  . Financial constraints related to projected home repair needs . Limited knowledge of community resources available to address housing repair  Clinical Social Work Clinical Goal(s):  Marland Kitchen Over the next 45 days the patient will work with CCM SW to become more knowledgeable of community resources to assist with home repair.  CCM SW Interventions: . Patient interviewed and appropriate assessments performed . Determined the patient is interested in future home repair resources as her home will need repairs in the future . Provided patient with information about housing loan options provided by [employer] [facility])  . Mailed the patient information on PTRC . Discussed plans with patient for ongoing care management follow up and provided patient with direct contact information for care management team . Scheduled follow up call to the patient over the next 5 weeks to confirm  receipt of resource   Patient Self Care Activities:  . Self administers medications as prescribed . Calls pharmacy for medication refills . Performs ADL's independently  Initial goal documentation        Other   . COMPLETED: Assist with initial Chronic Care Management outreach and complete Social Determinants of Health Screen       Current Barriers:  Marland Kitchen Knowledge Barriers related to resources and support available to address needs related to Chronic Care Management and challenges surrounding Social Determinants of Health  Clinical Social Work Clinical Goal(s):   Over the next 5 days, the patient will understand the role of the CCM team and work with SW to complete SDOH (Social Determinants of Health) screen.  Over the next 30 days the patient will engage with CCM team to address needs in relation to diabetes and stage III renal disease.  Interventions:  Outbound call placed to the patient to introduce the Chronic Care Management program  Patient screened for SDOH (Social Determinants of Health)  Identified challenges with: No immediate needs but interested in learning about housing repair resources  Provided education about SW role and plan to address identified SDOH challenge(s)  Assessed for the patient's ability to verbalize current chronic medical conditions. The patient identifies current conditions as diabetes and kidney disease  Determined the patient has difficulty managing Diabetes due to medication management and adherence  Assessed for patient ability to afford prescribed medications. The patient identifies difficulty affording [place] with embedded PharmD regarding medication assistance  Collaboration with RN Case Manager regarding patient  enrollment and above interventions  Scheduled initial assessment with RN Case Manager for 03/11/2019  Advised the patient to have medications ready to review  Patient Self Care Activities:   Patient  currently unable to independently manage chronic conditions   Patient currently unable to identify community resources to assist with home repair   Initial goal documentation: See individual goal documentation for details related to specific interventions         Follow Up Plan: No further SW follow up planned at this time due to an inability to maintain contact. SW is available to assist with patient care coordination needs.    Daneen Schick, BSW, CDP Social Worker, Certified Dementia Practitioner [place] / [place] Management [phone]

## 2019-05-13 ENCOUNTER — Other Ambulatory Visit: Payer: Self-pay | Admitting: Internal Medicine

## 2019-05-13 ENCOUNTER — Other Ambulatory Visit: Payer: Self-pay | Admitting: Pharmacy Technician

## 2019-05-13 ENCOUNTER — Ambulatory Visit (INDEPENDENT_AMBULATORY_CARE_PROVIDER_SITE_OTHER): Payer: Medicare Other | Admitting: Pharmacist

## 2019-05-13 DIAGNOSIS — E7800 Pure hypercholesterolemia, unspecified: Secondary | ICD-10-CM | POA: Diagnosis not present

## 2019-05-13 DIAGNOSIS — E1122 Type 2 diabetes mellitus with diabetic chronic kidney disease: Secondary | ICD-10-CM | POA: Diagnosis not present

## 2019-05-13 DIAGNOSIS — N183 Chronic kidney disease, stage 3 unspecified: Secondary | ICD-10-CM

## 2019-05-13 NOTE — Patient Outreach (Signed)
[name]) Care Management  05/13/2019  Nicole Nunez [birth year] [id number]                                       Medication Assistance Referral  Referral From: [facility] Embedded RPh [name]   Medication/Company: [name] / [name] Patient application portion:  Mailed Provider application portion: Faxed  to [name] Provider address/fax verified via: Office website  Follow up:  Will follow up with patient in 10-14 business days to confirm application(s) have been received.  Nicole Nunez [place] Certified Pharmacy Technician [place] Management Direct [phone]

## 2019-05-20 NOTE — Progress Notes (Signed)
Chronic Care Management    Visit Note  05/13/2019 Name: Nicole Nunez MRN: [id number] DOB: [birth year]  Referred by: Glendale Chard, MD Reason for referral : Chronic Care Management   Nicole Nunez is a 73 y.o. year old female who is a primary care patient of Glendale Chard, MD. The CCM team was consulted for assistance with chronic disease management and care coordination needs related to HTN, HLD and DMII  Review of patient status, including review of consultants reports, relevant laboratory and other test results, and collaboration with appropriate care team members and the patient's provider was performed as part of comprehensive patient evaluation and provision of chronic care management services.    I spoke with [name] by telephone today.  Medications: Outpatient Encounter Medications as of 05/13/2019  Medication Sig  . aspirin EC 81 MG tablet Take 81 mg by mouth daily.  . Calcium Carbonate (CALCIUM 600 PO) Take 1 tablet by mouth.  . Cholecalciferol (VITAMIN D3) 125 MCG (5000 UT) CAPS Take 1 capsule by mouth.  . Coenzyme Q10 100 MG TABS Take 1 tablet by mouth daily.  . Lancets (ONETOUCH DELICA PLUS [id number]) MISC USE AS DIRECTED TO CHECK BLOOD SUGARS ONCE DAILY  . olmesartan (BENICAR) 20 MG tablet TAKE 1 TABLET BY MOUTH  DAILY  . ONETOUCH VERIO test strip USE AS DIRECTED TO CHECK BLOOD SUGARS ONCE D  . OZEMPIC, 0.25 OR 0.5 MG/DOSE, 2 MG/1.5ML SOPN INJECT SUBCUTANEOUSLY 0.5MG ONCE A WEEK ON THURSDAY  . pantoprazole (PROTONIX) 40 MG tablet TAKE 1 TABLET(40 MG) BY MOUTH DAILY  . pioglitazone-metformin (ACTOPLUS MET) 15-500 MG tablet TAKE 1 TABLET BY MOUTH  TWICE A DAY  . Pitavastatin Calcium 2 MG TABS Take 1 tablet by mouth daily.   No facility-administered encounter medications on file as of 05/13/2019.     Objective:   Goals Addressed            This Visit's Progress     Patient Stated   . I would like to apply for financial assistance (pt-stated)      Current Barriers:  . Financial Barriers: patient has [facility] insurance and reports copay for Ozempic is cost prohibitive at this time  Pharmacist Clinical Goal(s):  Marland Kitchen Over the next 30 days, patient will work with PharmD and providers to relieve medication access concerns (goal established 04/2019)  Interventions: . Comprehensive medication review completed; medication list updated in electronic medical record.  [name] by [employer]: Patient meets income/No out of pocket spend criteria for this medication's patient assistance program. Reviewed application process. Patient will provide proof of income, out of pocket spend report, and will sign application. Will collaborate with primary care provider, Dr. Glendale Chard for their portion of application. Once completed, will submit to [employer] patient assistance program. . Application prepared and mailed to patient & released to PCP   Patient Self Care Activities:  . Patient will provide necessary portions of application   Please see past updates related to this goal by clicking on the "Past Updates" button in the selected goal      . I would like to manage my diabetes (pt-stated)       Current Barriers:  . Diabetes: T2DM; most recent A1c 6.4% on 04/24/19 (was 6.2% on 12/20/18) . Current antihyperglycemic regimen: Ozempic 0.70m weekly, actos plus o Consider dropping actos (pioglitazone) to optimize therapy/ keep metformin (Scr 1.29) . Denies hypoglycemic symptoms; denies hyperglycemic symptoms . Current meal patterns: patient reports compliance with DM diet, she works  to decrease her carbohydrate intake; avoids sugary drinks, etc . Current exercise: walking . Current blood glucose readings: FBG 90-100 . Cardiovascular risk reduction: o Current hypertensive regimen: olmesartan - BP 110/72 at last office visit 04/24/19 - Goal 130/80-controlled o Current hyperlipidemia regimen: pitavastatin (only statin patient will take-has been using samples due to  cost); LDL 79 in 11/2018  Pharmacist Clinical Goal(s):  Marland Kitchen Over the next 90 days, patient with work with PharmD and primary care provider to address needs related to optimized medication management of chronic conditions  Interventions: . Comprehensive medication review performed, medication list updated in electronic medical record . Reviewed & discussed the following diabetes-related information with patient: o Continue checking blood sugars as directed o Follow ADA recommended "diabetes-friendly" diet  (reviewed healthy snack/food options) o Discussed GLP-1 injection technique; Patient uses One touch verio glucometer o Reviewed medication purpose/side effects-->patient denies adverse events  Patient Self Care Activities:  . Patient will check blood glucose daily in the AM , document, and provide at future appointments . Patient will focus on medication adherence by continuing to take medications as prescribed; applying for financial assistance . Patient will take medications as prescribed . Patient will contact provider with any episodes of hypoglycemia . Patient will report any questions or concerns to provider   Please see past updates related to this goal by clicking on the "Past Updates" button in the selected goal          Plan:   The care management team will reach out to the patient again over the next 30 days.   Provider Signature Regina Eck, PharmD, BCPS Clinical Pharmacist, [facility] [place]: [phone]

## 2019-05-20 NOTE — Patient Instructions (Signed)
Visit Information  Goals Addressed            This Visit's Progress     Patient Stated   . I would like to apply for financial assistance (pt-stated)       Current Barriers:  . Financial Barriers: patient has [facility] insurance and reports copay for Ozempic is cost prohibitive at this time  Pharmacist Clinical Goal(s):  Marland Kitchen Over the next 30 days, patient will work with PharmD and providers to relieve medication access concerns (goal established 04/2019)  Interventions: . Comprehensive medication review completed; medication list updated in electronic medical record.  [name] by [employer]: Patient meets income/No out of pocket spend criteria for this medication's patient assistance program. Reviewed application process. Patient will provide proof of income, out of pocket spend report, and will sign application. Will collaborate with primary care provider, Dr. Glendale Chard for their portion of application. Once completed, will submit to [employer] patient assistance program. . Application prepared and mailed to patient & released to PCP   Patient Self Care Activities:  . Patient will provide necessary portions of application   Please see past updates related to this goal by clicking on the "Past Updates" button in the selected goal      . I would like to manage my diabetes (pt-stated)       Current Barriers:  . Diabetes: T2DM; most recent A1c 6.4% on 04/24/19 (was 6.2% on 12/20/18) . Current antihyperglycemic regimen: Ozempic 0.5mg  weekly, actos plus o Consider dropping actos (pioglitazone) to optimize therapy/ keep metformin (Scr 1.29) . Denies hypoglycemic symptoms; denies hyperglycemic symptoms . Current meal patterns: patient reports compliance with DM diet, she works to decrease her carbohydrate intake; avoids sugary drinks, etc . Current exercise: walking . Current blood glucose readings: FBG 90-100 . Cardiovascular risk reduction: o Current hypertensive regimen: olmesartan - BP  110/72 at last office visit 04/24/19 - Goal 130/80-controlled o Current hyperlipidemia regimen: pitavastatin (only statin patient will take-has been using samples due to cost); LDL 79 in 11/2018  Pharmacist Clinical Goal(s):  Marland Kitchen Over the next 90 days, patient with work with PharmD and primary care provider to address needs related to optimized medication management of chronic conditions  Interventions: . Comprehensive medication review performed, medication list updated in electronic medical record . Reviewed & discussed the following diabetes-related information with patient: o Continue checking blood sugars as directed o Follow ADA recommended "diabetes-friendly" diet  (reviewed healthy snack/food options) o Discussed GLP-1 injection technique; Patient uses One touch verio glucometer o Reviewed medication purpose/side effects-->patient denies adverse events  Patient Self Care Activities:  . Patient will check blood glucose daily in the AM , document, and provide at future appointments . Patient will focus on medication adherence by continuing to take medications as prescribed; applying for financial assistance . Patient will take medications as prescribed . Patient will contact provider with any episodes of hypoglycemia . Patient will report any questions or concerns to provider   Please see past updates related to this goal by clicking on the "Past Updates" button in the selected goal         The patient verbalized understanding of instructions provided today and declined a print copy of patient instruction materials.   The care management team will reach out to the patient again over the next 30 days.   SIGNATURE Regina Eck, PharmD, BCPS Clinical Pharmacist, [facility] [place]: [phone]

## 2019-05-21 ENCOUNTER — Telehealth: Payer: Self-pay

## 2019-05-21 NOTE — Telephone Encounter (Signed)
I left the pt a message that Dr. Baird Cancer wants to know if the pt is willing to take a cholesterol med once weekly and if so Dr. Baird Cancer is going to send in Lovastatin 40 mg and for the pt to please pickup by Thursday.

## 2019-05-22 ENCOUNTER — Other Ambulatory Visit: Payer: Self-pay

## 2019-05-22 MED ORDER — LOVASTATIN 40 MG PO TABS: 40.0000 mg | ORAL_TABLET | Freq: Every day | ORAL | 1 refills | Status: DC

## 2019-05-22 MED ORDER — LOVASTATIN 40 MG PO TABS: 40.0000 mg | ORAL_TABLET | Freq: Every day | ORAL | 0 refills | Status: DC

## 2019-06-06 ENCOUNTER — Telehealth: Payer: Self-pay

## 2019-06-19 ENCOUNTER — Ambulatory Visit: Payer: Self-pay | Admitting: Pharmacist

## 2019-06-20 ENCOUNTER — Other Ambulatory Visit: Payer: Self-pay | Admitting: Pharmacy Technician

## 2019-06-20 NOTE — Patient Outreach (Signed)
[facility]) Care Management  06/20/2019  Nicole Nunez 1945/09/25 [id number]   Received patient portion(s) of patient assistance application(s) for Ozempic. Faxed completed application and required documents into [employer].  Will follow up with company(ies) in 7-10 business days to check status of application(s).  Nicole Nunez Certified Pharmacy Technician [place] Management Direct Dial:[phone]

## 2019-06-21 NOTE — Progress Notes (Signed)
  Chronic Care Management   Outreach Note  06/20/2019 Name: Nicole Nunez MRN: [id number] DOB: Mar 04, 1946  Referred by: Glendale Chard, MD Reason for referral : Chronic Care Management   An unsuccessful telephone outreach was attempted today. The patient was referred to the case management team by for assistance with care management and care coordination.   Follow Up Plan: A HIPPA compliant phone message was left for the patient providing contact information and requesting a return call.  The care management team will reach out to the patient again over the next 5-7 days.   SIGNATURE Regina Eck, PharmD, BCPS Clinical Pharmacist, [facility] [place]: [phone]

## 2019-06-26 ENCOUNTER — Ambulatory Visit (INDEPENDENT_AMBULATORY_CARE_PROVIDER_SITE_OTHER): Payer: Medicare Other | Admitting: Pharmacist

## 2019-06-26 ENCOUNTER — Other Ambulatory Visit: Payer: Self-pay | Admitting: Pharmacy Technician

## 2019-06-26 DIAGNOSIS — E1122 Type 2 diabetes mellitus with diabetic chronic kidney disease: Secondary | ICD-10-CM | POA: Diagnosis not present

## 2019-06-26 DIAGNOSIS — N183 Chronic kidney disease, stage 3 unspecified: Secondary | ICD-10-CM

## 2019-06-26 NOTE — Patient Outreach (Signed)
[facility]) Care Management  06/26/2019  LOVELEEN PELLAND 22-Apr-1946 [id number]   Received message from [employer] that patient has been approved for [employer] until 05/22/20.  Will remove myself from care team  Maud Deed. Chana Bode [place] Certified Pharmacy Technician [place] Management Direct [phone]

## 2019-06-28 NOTE — Patient Instructions (Signed)
Visit Information  Goals Addressed            This Visit's Progress     Patient Stated   . COMPLETED: I would like to apply for financial assistance (pt-stated)       Current Barriers:  . Financial Barriers: patient has [facility] insurance and reports copay for Ozempic is cost prohibitive at this time  Pharmacist Clinical Goal(s):  Marland Kitchen Over the next 30 days, patient will work with PharmD and providers to relieve medication access concerns (goal established 04/2019)  Interventions: . Comprehensive medication review completed; medication list updated in electronic medical record.  [name] by [employer]: Patient meets income/No out of pocket spend criteria for this medication's patient assistance program. Reviewed application process. Patient will provide proof of income, out of pocket spend report, and will sign application. Will collaborate with primary care provider, Dr. Glendale Chard for their portion of application. Once completed, will submit to Nov[employer] patient assistance program. . Patient approved for [place] for 2021; refill request due in April 2021   Patient Self Care Activities:  . Patient will provide necessary portions of application   Please see past updates related to this goal by clicking on the "Past Updates" button in the selected goal      . I would like to manage my diabetes (pt-stated)       Current Barriers:  . Diabetes: T2DM; most recent A1c 6.4% on 04/24/19 (was 6.2% on 12/20/18) . Current antihyperglycemic regimen: Ozempic 0.5mg  weekly, actos plus o Consider dropping actos (pioglitazone) to optimize therapy/ keep metformin (Scr 1.29) o Ozempic delivered to PCP office; patient to pick up . Denies hypoglycemic symptoms; denies hyperglycemic symptoms . Current meal patterns: patient reports compliance with DM diet, she works to decrease her carbohydrate intake; avoids sugary drinks, etc . Current exercise: walking . Current blood glucose readings: FBG  90-110 . Cardiovascular risk reduction: o Current hypertensive regimen: olmesartan - BP 110/72 at last office visit 04/24/19 - Goal 130/80-controlled o Current hyperlipidemia regimen: pitavastatin (only statin patient will take-has been using samples due to cost); LDL 79 in 11/2018  Pharmacist Clinical Goal(s):  Marland Kitchen Over the next 90 days, patient with work with PharmD and primary care provider to address needs related to optimized medication management of chronic conditions  Interventions: . Comprehensive medication review performed, medication list updated in electronic medical record . Reviewed & discussed the following diabetes-related information with patient: o Continue checking blood sugars as directed o Follow ADA recommended "diabetes-friendly" diet  (reviewed healthy snack/food options) o Discussed GLP-1 injection technique; Patient uses One touch verio glucometer o Reviewed medication purpose/side effects-->patient denies adverse events  Patient Self Care Activities:  . Patient will check blood glucose daily in the AM , document, and provide at future appointments . Patient will focus on medication adherence by continuing to take medications as prescribed; applying for financial assistance . Patient will take medications as prescribed . Patient will contact provider with any episodes of hypoglycemia . Patient will report any questions or concerns to provider   Please see past updates related to this goal by clicking on the "Past Updates" button in the selected goal         The patient verbalized understanding of instructions provided today and declined a print copy of patient instruction materials.   The care management team will reach out to the patient again over the next 30 days.   SIGNATURE Regina Eck, PharmD, BCPS Clinical Pharmacist, [facility] [place]  [place]: [phone]

## 2019-06-28 NOTE — Progress Notes (Signed)
Chronic Care Management   Visit Note  06/26/2019 Name: Nicole Nunez MRN: [id number] DOB: 1945/09/30  Referred by: Glendale Chard, MD Reason for referral : Chronic Care Management   Nicole Nunez is a 74 y.o. year old female who is a primary care patient of Glendale Chard, MD. The CCM team was consulted for assistance with chronic disease management and care coordination needs related to DMII  Review of patient status, including review of consultants reports, relevant laboratory and other test results, and collaboration with appropriate care team members and the patient's provider was performed as part of comprehensive patient evaluation and provision of chronic care management services.    Patient was contacted via telephone for today's visit regarding her diabetes and chronic care management  Medications: Outpatient Encounter Medications as of 06/26/2019  Medication Sig  . aspirin EC 81 MG tablet Take 81 mg by mouth daily.  . Calcium Carbonate (CALCIUM 600 PO) Take 1 tablet by mouth.  . Cholecalciferol (VITAMIN D3) 125 MCG (5000 UT) CAPS Take 1 capsule by mouth.  . Coenzyme Q10 100 MG TABS Take 1 tablet by mouth daily.  . Lancets (ONETOUCH DELICA PLUS [id number]) MISC USE AS DIRECTED TO CHECK BLOOD SUGARS ONCE DAILY  . lovastatin (MEVACOR) 40 MG tablet Take 1 tablet (40 mg total) by mouth at bedtime.  Marland Kitchen olmesartan (BENICAR) 20 MG tablet TAKE 1 TABLET BY MOUTH  DAILY  . ONETOUCH VERIO test strip USE AS DIRECTED TO CHECK BLOOD SUGARS ONCE D  . OZEMPIC, 0.25 OR 0.5 MG/DOSE, 2 MG/1.5ML SOPN INJECT SUBCUTANEOUSLY 0.5MG ONCE A WEEK ON THURSDAY  . pantoprazole (PROTONIX) 40 MG tablet TAKE 1 TABLET(40 MG) BY MOUTH DAILY  . pioglitazone-metformin (ACTOPLUS MET) 15-500 MG tablet TAKE 1 TABLET BY MOUTH  TWICE A DAY  . Pitavastatin Calcium 2 MG TABS Take 1 tablet by mouth daily.   No facility-administered encounter medications on file as of 06/26/2019.     Objective:   Goals Addressed             This Visit's Progress     Patient Stated   . COMPLETED: I would like to apply for financial assistance (pt-stated)       Current Barriers:  . Financial Barriers: patient has [facility] insurance and reports copay for Ozempic is cost prohibitive at this time  Pharmacist Clinical Goal(s):  Marland Kitchen Over the next 30 days, patient will work with PharmD and providers to relieve medication access concerns (goal established 04/2019)  Interventions: . Comprehensive medication review completed; medication list updated in electronic medical record.  [name] by [employer]: Patient meets income/No out of pocket spend criteria for this medication's patient assistance program. Reviewed application process. Patient will provide proof of income, out of pocket spend report, and will sign application. Will collaborate with primary care provider, Dr. Glendale Chard for their portion of application. Once completed, will submit to Novo patient assistance program. . Patient approved for [place] for 2021; refill request due in April 2021   Patient Self Care Activities:  . Patient will provide necessary portions of application   Please see past updates related to this goal by clicking on the "Past Updates" button in the selected goal      . I would like to manage my diabetes (pt-stated)       Current Barriers:  . Diabetes: T2DM; most recent A1c 6.4% on 04/24/19 (was 6.2% on 12/20/18) . Current antihyperglycemic regimen: Ozempic 0.22m weekly, actos plus o Consider dropping actos (pioglitazone) to  optimize therapy/ keep metformin (Scr 1.29) o Ozempic delivered to PCP office; patient to pick up . Denies hypoglycemic symptoms; denies hyperglycemic symptoms . Current meal patterns: patient reports compliance with DM diet, she works to decrease her carbohydrate intake; avoids sugary drinks, etc . Current exercise: walking . Current blood glucose readings: FBG 90-110 . Cardiovascular risk reduction: o Current  hypertensive regimen: olmesartan - BP 110/72 at last office visit 04/24/19 - Goal 130/80-controlled o Current hyperlipidemia regimen: pitavastatin (only statin patient will take-has been using samples due to cost); LDL 79 in 11/2018  Pharmacist Clinical Goal(s):  . Over the next 90 days, patient with work with PharmD and primary care provider to address needs related to optimized medication management of chronic conditions  Interventions: . Comprehensive medication review performed, medication list updated in electronic medical record . Reviewed & discussed the following diabetes-related information with patient: o Continue checking blood sugars as directed o Follow ADA recommended "diabetes-friendly" diet  (reviewed healthy snack/food options) o Discussed GLP-1 injection technique; Patient uses One touch verio glucometer o Reviewed medication purpose/side effects-->patient denies adverse events  Patient Self Care Activities:  . Patient will check blood glucose daily in the AM , document, and provide at future appointments . Patient will focus on medication adherence by continuing to take medications as prescribed; applying for financial assistance . Patient will take medications as prescribed . Patient will contact provider with any episodes of hypoglycemia . Patient will report any questions or concerns to provider   Please see past updates related to this goal by clicking on the "Past Updates" button in the selected goal          Plan:   The care management team will reach out to the patient again over the next 30 days.   Provider Signature  [place] , PharmD, BCPS Clinical Pharmacist, [facility] [place]  [facility]  Direct Dial: [phone]     

## 2019-07-02 DIAGNOSIS — E118 Type 2 diabetes mellitus with unspecified complications: Secondary | ICD-10-CM | POA: Diagnosis not present

## 2019-07-02 DIAGNOSIS — L84 Corns and callosities: Secondary | ICD-10-CM | POA: Diagnosis not present

## 2019-07-02 DIAGNOSIS — B351 Tinea unguium: Secondary | ICD-10-CM | POA: Diagnosis not present

## 2019-07-08 ENCOUNTER — Ambulatory Visit: Payer: Medicare Other | Attending: Internal Medicine

## 2019-07-08 DIAGNOSIS — Z23 Encounter for immunization: Secondary | ICD-10-CM

## 2019-07-08 NOTE — Progress Notes (Signed)
   Covid-19 Vaccination Clinic  Name:  ARIEN BLACKSTOCK    MRN: [id number] DOB: 06-Aug-1945  07/08/2019  Ms. Yzaguirre was observed post Covid-19 immunization for 15 minutes without incidence. She was provided with Vaccine Information Sheet and instruction to access the V-Safe system.   Ms. Tacey was instructed to call 911 with any severe reactions post vaccine: Marland Kitchen Difficulty breathing  . Swelling of your face and throat  . A fast heartbeat  . A bad rash all over your body  . Dizziness and weakness    Immunizations Administered    Name Date Dose VIS Date Route   Pfizer COVID-19 Vaccine 07/08/2019  3:38 PM 0.3 mL 05/03/2019 Intramuscular   Manufacturer: [employer]   Lot: [id number]   [place]: [id number]

## 2019-07-09 ENCOUNTER — Telehealth: Payer: Self-pay

## 2019-07-18 ENCOUNTER — Other Ambulatory Visit: Payer: Self-pay | Admitting: Internal Medicine

## 2019-07-22 ENCOUNTER — Ambulatory Visit: Payer: Self-pay

## 2019-07-30 ENCOUNTER — Encounter: Payer: Self-pay | Admitting: Internal Medicine

## 2019-07-30 ENCOUNTER — Ambulatory Visit: Payer: Self-pay | Admitting: Internal Medicine

## 2019-07-30 ENCOUNTER — Other Ambulatory Visit: Payer: Self-pay

## 2019-07-30 ENCOUNTER — Ambulatory Visit (INDEPENDENT_AMBULATORY_CARE_PROVIDER_SITE_OTHER): Payer: Medicare Other | Admitting: Internal Medicine

## 2019-07-30 ENCOUNTER — Ambulatory Visit: Payer: Medicare Other | Attending: Internal Medicine

## 2019-07-30 VITALS — BP 114/66 | HR 82 | Temp 98.2°F | Ht <= 58 in | Wt 158.2 lb

## 2019-07-30 DIAGNOSIS — I129 Hypertensive chronic kidney disease with stage 1 through stage 4 chronic kidney disease, or unspecified chronic kidney disease: Secondary | ICD-10-CM

## 2019-07-30 DIAGNOSIS — E1122 Type 2 diabetes mellitus with diabetic chronic kidney disease: Secondary | ICD-10-CM | POA: Diagnosis not present

## 2019-07-30 DIAGNOSIS — H9193 Unspecified hearing loss, bilateral: Secondary | ICD-10-CM

## 2019-07-30 DIAGNOSIS — Z6833 Body mass index (BMI) 33.0-33.9, adult: Secondary | ICD-10-CM

## 2019-07-30 DIAGNOSIS — E6609 Other obesity due to excess calories: Secondary | ICD-10-CM

## 2019-07-30 DIAGNOSIS — N183 Chronic kidney disease, stage 3 unspecified: Secondary | ICD-10-CM | POA: Diagnosis not present

## 2019-07-30 DIAGNOSIS — Z23 Encounter for immunization: Secondary | ICD-10-CM

## 2019-07-30 NOTE — Patient Instructions (Signed)

## 2019-07-30 NOTE — Progress Notes (Signed)
This visit occurred during the SARS-CoV-2 public health emergency.  Safety protocols were in place, including screening questions prior to the visit, additional usage of staff PPE, and extensive cleaning of exam room while observing appropriate contact time as indicated for disinfecting solutions.  Subjective:     Patient ID: Nicole Nunez , female    DOB: [birth year] , 74 y.o.   MRN: [id number]   Chief Complaint  Patient presents with  . Diabetes  . Hypertension    HPI  Diabetes She presents for her follow-up diabetic visit. She has type 2 diabetes mellitus. Her disease course has been stable. There are no hypoglycemic associated symptoms. Pertinent negatives for diabetes include no blurred vision and no chest pain. There are no hypoglycemic complications.  Hypertension This is a chronic problem. The current episode started more than 1 year ago. The problem has been gradually improving since onset. The problem is controlled. Pertinent negatives include no blurred vision, chest pain, palpitations or shortness of breath.     Past Medical History:  Diagnosis Date  . CKD stage 3 due to type 2 diabetes mellitus ([place])   . DM (diabetes mellitus) ([place])   . High cholesterol   . HTN (hypertension)      Family History  Problem Relation Age of Onset  . Asthma Mother   . Hypertension Mother   . Alzheimer's disease Mother   . Early death Father      Current Outpatient Medications:  .  aspirin EC 81 MG tablet, Take 81 mg by mouth daily., Disp: , Rfl:  .  Calcium Carbonate (CALCIUM 600 PO), Take 1 tablet by mouth., Disp: , Rfl:  .  Cholecalciferol (VITAMIN D3) 125 MCG (5000 UT) CAPS, Take 1 capsule by mouth., Disp: , Rfl:  .  Coenzyme Q10 100 MG TABS, Take 1 tablet by mouth daily., Disp: , Rfl:  .  Lancets (ONETOUCH DELICA PLUS [id number]) MISC, USE AS DIRECTED TO CHECK BLOOD SUGARS ONCE DAILY, Disp: 100 each, Rfl: 11 .  olmesartan (BENICAR) 20 MG tablet, TAKE 1 TABLET BY MOUTH  DAILY,  Disp: 90 tablet, Rfl: 1 .  ONETOUCH VERIO test strip, USE AS DIRECTED TO CHECK BLOOD SUGARS ONCE D, Disp: 100 each, Rfl: 11 .  OZEMPIC, 0.25 OR 0.5 MG/DOSE, 2 MG/1.5ML SOPN, INJECT SUBCUTANEOUSLY 0.5MG ONCE A WEEK ON THURSDAY, Disp: 3 pen, Rfl: 1 .  pantoprazole (PROTONIX) 40 MG tablet, TAKE 1 TABLET(40 MG) BY MOUTH DAILY, Disp: 90 tablet, Rfl: 1 .  pioglitazone-metformin (ACTOPLUS MET) 15-500 MG tablet, TAKE 1 TABLET BY MOUTH  TWICE A DAY, Disp: 180 tablet, Rfl: 1 .  Pitavastatin Calcium 2 MG TABS, Take 1 tablet by mouth daily., Disp: , Rfl:  .  lovastatin (MEVACOR) 40 MG tablet, Take 1 tablet (40 mg total) by mouth at bedtime. (Patient not taking: Reported on 07/30/2019), Disp: 90 tablet, Rfl: 0   No Known Allergies   Review of Systems  Constitutional: Negative.   HENT: Positive for hearing loss.        At the end of visit, as I was leaving the room - she mentioned that she has hearing issues. She is not sure if it is a true issue or if she has selective hearing. Denies tinnitus.   Eyes: Negative for blurred vision.  Respiratory: Negative.  Negative for shortness of breath.   Cardiovascular: Negative.  Negative for chest pain and palpitations.  Gastrointestinal: Negative.   Neurological: Negative.   Psychiatric/Behavioral: Negative.  Today's Vitals   07/30/19 1139  BP: 114/66  Pulse: 82  Temp: 98.2 F (36.8 C)  TempSrc: Oral  Weight: 158 lb 3.2 oz (71.8 kg)  Height: 4' 9.8" (1.468 m)   Body mass index is 33.29 kg/m.   Objective:  Physical Exam Vitals and nursing note reviewed.  Constitutional:      Appearance: Normal appearance.  HENT:     Head: Normocephalic and atraumatic.  Cardiovascular:     Rate and Rhythm: Normal rate and regular rhythm.     Heart sounds: Normal heart sounds.  Pulmonary:     Effort: Pulmonary effort is normal.     Breath sounds: Normal breath sounds.  Skin:    General: Skin is warm.  Neurological:     General: No focal deficit present.      Mental Status: She is alert.  Psychiatric:        Mood and Affect: Mood normal.        Behavior: Behavior normal.         Assessment And Plan:     1. Type 2 diabetes mellitus with stage 3 chronic kidney disease, without long-term current use of insulin, unspecified whether stage 3a or 3b CKD (HCC)  Chronic, this has been stable. I will check labs as listed below.  She is encouraged to resume a regular exercise routine - and to aim for at least 30 minutes five days per week.   - CMP14+EGFR - Hemoglobin A1c - Lipid panel  2. 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 She will continue with current meds. She is encouraged to avoid adding salt to her foods.   3. Hearing deficit, bilateral  Screening audiometry performed today, this was not normal. I will refer her for formal audiometry testing. - Ambulatory referral to Audiology  4. Class 1 obesity due to excess calories with serious comorbidity and body mass index (BMI) of 33.0 to 33.9 in adult  She is encouraged to strive for BMI less than 30 to decrease cardiac risk. Advised to aim for at least 150 minutes of exercise per week.   Maximino Greenland, MD    THE PATIENT IS ENCOURAGED TO PRACTICE SOCIAL DISTANCING DUE TO THE COVID-19 PANDEMIC.

## 2019-07-30 NOTE — Progress Notes (Signed)
   Covid-19 Vaccination Clinic  Name:  Nicole Nunez    MRN: [id number] DOB: [birth year]  07/30/2019  Nicole Nunez was observed post Covid-19 immunization for 15 minutes without incident. She was provided with Vaccine Information Sheet and instruction to access the V-Safe system.   Nicole Nunez was instructed to call 911 with any severe reactions post vaccine: Marland Kitchen Difficulty breathing  . Swelling of face and throat  . A fast heartbeat  . A bad rash all over body  . Dizziness and weakness   Immunizations Administered    Name Date Dose VIS Date Route   Pfizer COVID-19 Vaccine 07/30/2019  9:29 AM 0.3 mL 05/03/2019 Intramuscular   Manufacturer: [place]   Lot: [id number]   [place]: [id number]

## 2019-07-31 ENCOUNTER — Ambulatory Visit: Payer: Medicare Other

## 2019-07-31 ENCOUNTER — Other Ambulatory Visit: Payer: Self-pay | Admitting: Internal Medicine

## 2019-07-31 LAB — CMP14+EGFR
ALT: 10 IU/L (ref 0–32)
AST: 22 IU/L (ref 0–40)
Albumin/Globulin Ratio: 1.7 (ref 1.2–2.2)
Albumin: 4.2 g/dL (ref 3.7–4.7)
Alkaline Phosphatase: 89 IU/L (ref 39–117)
BUN/Creatinine Ratio: 12 (ref 12–28)
BUN: 15 mg/dL (ref 8–27)
Bilirubin Total: 0.4 mg/dL (ref 0.0–1.2)
CO2: 23 mmol/L (ref 20–29)
Calcium: 9.7 mg/dL (ref 8.7–10.3)
Chloride: 105 mmol/L (ref 96–106)
Creatinine, Ser: 1.21 mg/dL — ABNORMAL HIGH (ref 0.57–1.00)
GFR calc Af Amer: 52 mL/min/{1.73_m2} — ABNORMAL LOW (ref >59)
GFR calc non Af Amer: 45 mL/min/{1.73_m2} — ABNORMAL LOW (ref >59)
Globulin, Total: 2.5 g/dL (ref 1.5–4.5)
Glucose: 96 mg/dL (ref 65–99)
Potassium: 4.3 mmol/L (ref 3.5–5.2)
Sodium: 142 mmol/L (ref 134–144)
Total Protein: 6.7 g/dL (ref 6.0–8.5)

## 2019-07-31 LAB — LIPID PANEL
Chol/HDL Ratio: 2.2 ratio (ref 0.0–4.4)
Cholesterol, Total: 148 mg/dL (ref 100–199)
HDL: 66 mg/dL (ref >39)
LDL Chol Calc (NIH): 70 mg/dL (ref 0–99)
Triglycerides: 58 mg/dL (ref 0–149)
VLDL Cholesterol Cal: 12 mg/dL (ref 5–40)

## 2019-07-31 LAB — HEMOGLOBIN A1C
Est. average glucose Bld gHb Est-mCnc: 131 mg/dL
Hgb A1c MFr Bld: 6.2 % — ABNORMAL HIGH (ref 4.8–5.6)

## 2019-08-08 DIAGNOSIS — Z961 Presence of intraocular lens: Secondary | ICD-10-CM | POA: Diagnosis not present

## 2019-08-08 DIAGNOSIS — E113211 Type 2 diabetes mellitus with mild nonproliferative diabetic retinopathy with macular edema, right eye: Secondary | ICD-10-CM | POA: Diagnosis not present

## 2019-08-08 DIAGNOSIS — H43821 Vitreomacular adhesion, right eye: Secondary | ICD-10-CM | POA: Diagnosis not present

## 2019-08-08 DIAGNOSIS — E113292 Type 2 diabetes mellitus with mild nonproliferative diabetic retinopathy without macular edema, left eye: Secondary | ICD-10-CM | POA: Diagnosis not present

## 2019-08-14 ENCOUNTER — Telehealth: Payer: Self-pay

## 2019-08-15 ENCOUNTER — Other Ambulatory Visit: Payer: Self-pay

## 2019-08-15 ENCOUNTER — Telehealth: Payer: Self-pay

## 2019-08-15 ENCOUNTER — Ambulatory Visit: Payer: Self-pay

## 2019-08-15 DIAGNOSIS — E7800 Pure hypercholesterolemia, unspecified: Secondary | ICD-10-CM

## 2019-08-15 DIAGNOSIS — N183 Chronic kidney disease, stage 3 unspecified: Secondary | ICD-10-CM

## 2019-08-15 DIAGNOSIS — E1122 Type 2 diabetes mellitus with diabetic chronic kidney disease: Secondary | ICD-10-CM

## 2019-08-16 NOTE — Chronic Care Management (AMB) (Signed)
  Chronic Care Management   Outreach Note  08/16/2019 Name: Nicole Nunez MRN: [id number] DOB: Mar 10, 1946  Referred by: Glendale Chard, MD Reason for referral : Chronic Care Management (RQ #2 Initial RN Call -DM/CKD)   A second unsuccessful telephone outreach was attempted today. The patient was referred to the case management team for assistance with care management and care coordination.   Follow Up Plan: A HIPPA compliant phone message was left for the patient providing contact information and requesting a return call.  Telephone follow up appointment with care management team member scheduled for: 09/02/19  Barb Merino, RN, BSN, CCM Care Management Coordinator [place] [facility]  Direct Phone: [phone]

## 2019-08-20 DIAGNOSIS — H903 Sensorineural hearing loss, bilateral: Secondary | ICD-10-CM | POA: Diagnosis not present

## 2019-09-02 ENCOUNTER — Telehealth: Payer: Self-pay

## 2019-09-03 ENCOUNTER — Telehealth: Payer: Self-pay

## 2019-09-03 ENCOUNTER — Ambulatory Visit: Payer: Self-pay

## 2019-09-03 ENCOUNTER — Other Ambulatory Visit: Payer: Self-pay

## 2019-09-03 DIAGNOSIS — E7800 Pure hypercholesterolemia, unspecified: Secondary | ICD-10-CM

## 2019-09-03 DIAGNOSIS — N183 Chronic kidney disease, stage 3 unspecified: Secondary | ICD-10-CM

## 2019-09-03 DIAGNOSIS — E1122 Type 2 diabetes mellitus with diabetic chronic kidney disease: Secondary | ICD-10-CM

## 2019-09-04 NOTE — Chronic Care Management (AMB) (Signed)
  Chronic Care Management   Outreach Note  09/04/2019 Name: RAECHELL FOELLER MRN: [id number] DOB: 1945/08/29  Referred by: Glendale Chard, MD Reason for referral : Chronic Care Management (RQ #2 RN Call - DM/CKD)   Third unsuccessful telephone outreach was attempted today. The patient was referred to the case management team for assistance with care management and care coordination. The patient's primary care provider has been notified of our unsuccessful attempts to make or maintain contact with the patient. The care management team is pleased to engage with this patient at any time in the future should he/she be interested in assistance from the care management team.   Follow Up Plan: A HIPPA compliant phone message was left for the patient providing contact information and requesting a return call.  The care management team is available to follow up with the patient after provider conversation with the patient regarding recommendation for care management engagement and subsequent re-referral to the care management team.    Barb Merino, RN, BSN, CCM Care Management Coordinator [place] [facility]  Direct Phone: [phone]

## 2019-09-24 ENCOUNTER — Telehealth: Payer: Self-pay | Admitting: Internal Medicine

## 2019-09-24 NOTE — Chronic Care Management (AMB) (Signed)
  Care Management   Note  09/24/2019 Name: Nicole Nunez MRN: [id number] DOB: Jan 27, 1946  Nicole Nunez is a 74 y.o. year old female who is a primary care patient of Glendale Chard, MD and is actively engaged with the care management team. I reached out to [name] by phone today to assist with scheduling an initial visit with the Pharmacist.  Follow up plan: Telephone appointment with care management team member scheduled for:10/10/2019  [place] [place] [facility]  [place], [place] [address] Direct Dial: [email or web address] Website: [email or web address]

## 2019-10-09 DIAGNOSIS — E118 Type 2 diabetes mellitus with unspecified complications: Secondary | ICD-10-CM | POA: Diagnosis not present

## 2019-10-09 DIAGNOSIS — L84 Corns and callosities: Secondary | ICD-10-CM | POA: Diagnosis not present

## 2019-10-09 DIAGNOSIS — B351 Tinea unguium: Secondary | ICD-10-CM | POA: Diagnosis not present

## 2019-10-10 ENCOUNTER — Other Ambulatory Visit: Payer: Self-pay

## 2019-10-10 ENCOUNTER — Ambulatory Visit: Payer: Medicare Other

## 2019-10-10 DIAGNOSIS — N183 Chronic kidney disease, stage 3 unspecified: Secondary | ICD-10-CM

## 2019-10-10 DIAGNOSIS — E1122 Type 2 diabetes mellitus with diabetic chronic kidney disease: Secondary | ICD-10-CM

## 2019-10-10 DIAGNOSIS — E7800 Pure hypercholesterolemia, unspecified: Secondary | ICD-10-CM

## 2019-10-10 NOTE — Chronic Care Management (AMB) (Signed)
Chronic Care Management Pharmacy  Name: TAEGAN HAIDER  MRN: [id number] DOB: 08-Aug-1945  Chief Complaint/ HPI  [name],  74 y.o. , female presents for their Initial CCM visit with the clinical pharmacist via telephone due to COVID-19 Pandemic.  PCP : Glendale Chard, MD  Their chronic conditions include: Hypertension, Diabetes with stage 3 CKD, Pure hypercholesterolemia  Office Visits: 07/30/19 OV: Presented for DM and HTN follow up. Labs ordered (CMP14+EGFR, HgbA1c, Lipid panel). Pt mentioned hearing issues. Screening audiometry performed and was not normal. Referred to Audiology for formal audiometry testing.  05/21/19 Telephone call: Notified pt that Dr. Baird Cancer wants to start Lovastatin 9m once weekly if pt agrees  04/24/19 OV: Presented for DM and HTN follow up. Pt reported flare of back pain over the weekend. Labs ordered (HgbA1c, BMP8+EGFR). Advised to take magnesium nightly. Encouraged OTC lidocaine patches as needed. Provided dietary and exercise recommendations.   Consult Visit: 08/08/19 Ophthalmology OV: Vision is stable. Mild nonproliferative diabetic retinopathy of right eye w/ macular edema and of left eye w/o macular edema. Pseudophakia of both eyes. Vitreomacular adhesion of right eye  CCM Encounters: 06/26/19 PharmD: Comprehensive medication review performed. Consider d/c pioglitazone. Assisting with Ozempic PAP. Approved through [employer] 05/22/20.   06/20/19: Ozempic patient assistance application completed and faxed to [employer] 05/13/19 PharmD: Comprehensive medication review performed. Pt meets income requirement for Ozempic PAP through [employer] A1c controlled, consider dropping pioglitazone from combo Actoplusmet.   Medications: Outpatient Encounter Medications as of 10/10/2019  Medication Sig  . aspirin EC 81 MG tablet Take 81 mg by mouth daily.  .Marland Kitchenb complex vitamins tablet Take 1 tablet by mouth daily.  . Calcium Carbonate  (CALCIUM 600 PO) Take 1 tablet by mouth.  . Cholecalciferol (VITAMIN D3) 125 MCG (5000 UT) CAPS Take 1 capsule by mouth.  . Coenzyme Q10 100 MG TABS Take 1 tablet by mouth daily.  . Lancets (ONETOUCH DELICA PLUS [id number] MISC USE AS DIRECTED TO CHECK BLOOD SUGARS ONCE DAILY  . magnesium gluconate (MAGONATE) 500 MG tablet Take 500 mg by mouth daily.  . Multiple Vitamin (MULTIVITAMIN) tablet Take 1 tablet by mouth daily.  .Marland Kitchenolmesartan (BENICAR) 20 MG tablet TAKE 1 TABLET BY MOUTH  DAILY  . ONETOUCH VERIO test strip USE AS DIRECTED TO CHECK BLOOD SUGARS ONCE D  . OZEMPIC, 0.25 OR 0.5 MG/DOSE, 2 MG/1.5ML SOPN INJECT SUBCUTANEOUSLY 0.5MG ONCE A WEEK ON THURSDAY  . pantoprazole (PROTONIX) 40 MG tablet TAKE 1 TABLET(40 MG) BY MOUTH DAILY  . pioglitazone-metformin (ACTOPLUS MET) 15-500 MG tablet TAKE 1 TABLET BY MOUTH  TWICE A DAY (Patient taking differently: Take 1 tablet by mouth daily. )  . Pitavastatin Calcium 2 MG TABS Take 1 tablet by mouth daily. Monday, Wednesday, and Friday   No facility-administered encounter medications on file as of 10/10/2019.    Current Diagnosis/Assessment:  SDOH Interventions     Most Recent Value  SDOH Interventions  SDOH Interventions for the Following Domains  Financial Strain  Financial Strain Interventions  Other (Comment) [Will help apply with patient assistance program for Livalo. Will help reapply for Ozempic assistance when needed]     Goals Addressed            This Visit's Progress   . Pharmacy Care Plan       CARE PLAN ENTRY  Current Barriers:  . Chronic Disease Management support, education, and care coordination needs related to Hypertension, Hyperlipidemia, and Diabetes   Hypertension .  Pharmacist Clinical Goal(s): o Over the next 90 days, patient will work with PharmD and providers to maintain BP goal <130/80 . Current regimen:  o Olmesartan 68m daily . Interventions: o Increase exercise regimen to 30 minutes 5 times  weekly o Maintain heart healthy, balanced diet . Patient self care activities - Over the next 90 days, patient will: o Check BP if symptomatic, document, and provide at future appointments o Ensure daily salt intake < 2300 mg/day  Hyperlipidemia . Pharmacist Clinical Goal(s): o Over the next 90 days, patient will work with PharmD and providers to maintain LDL goal < 70 . Current regimen:  o Livalo 246mthree times weekly (Mon/Wed/Fri) . Interventions: o Will investigate patient assistance options for Livalo . Patient self care activities - Over the next 90 days, patient will: o Continue to limit fried foods o Take medications daily as directed  Diabetes . Pharmacist Clinical Goal(s): o Over the next 90 days, patient will work with PharmD and providers to maintain A1c goal <7% . Current regimen:  o Ozempic 0.78m68meekly (Fridays) o Pioglitazone/Metformin 15/500m778mily . Interventions: o Increase exercise to 30 minutes 5 times weekly o Encouraged continuation of diabetic-friendly, balanced diet . Patient self care activities - Over the next 90 days, patient will: o Check blood sugar once daily, document, and provide at future appointments o Contact provider with any episodes of hypoglycemia  Medication management . Pharmacist Clinical Goal(s): o Over the next 90 days, patient will work with PharmD and providers to maintain optimal medication adherence . [facility] o Comprehensive medication review performed. o Continue current medication management strategy . Patient self care activities - Over the next 90 days, patient will: o Focus on medication adherence by utilizing weekly pill box to organize medications o Take medications as prescribed o Report any questions or concerns to PharmD and/or provider(s)  Initial goal documentation        Diabetes   Recent Relevant Labs: Lab Results  Component Value  Date/Time   HGBA1C 6.2 (H) 07/30/2019 12:28 PM   HGBA1C 6.4 (H) 04/24/2019 05:43 PM   MICROALBUR 30 12/20/2018 12:33 PM   MICROALBUR 30 08/27/2018 01:22 PM     Checking BG: Daily in the morning  Recent FBG Readings: 111 this am, 98-125 range, normally less than 130 Patient has failed these meds in past: N/A Patient is currently controlled on the following medications:  -Ozempic 0.78mg 27mkly -Pioglitazone/metformin 15/500mg 478m daily  Last diabetic Foot exam: 12/20/18 Last diabetic Eye exam: 08/08/19  We discussed:  -Diet extensively  Tries to limit fried foods  Eats collards, brussel sprouts, cabbage, greens, sweet  potatoes  Doesn't like to cook that much, is going to try to cook more  Limits sweets  Oatmeal, cream of wheat, bacon or turkey[place]  2-3 meals a day (usually 2)  Tries not to eat past 6/6:30pm  Recommend balanced diet -Exercise extensively  Pt was working out 4-5 times weekly at a local recreation  center  before the pandemic (exercise classes, pickleball, etc)  She is going to start going back to the rec center and working  out when they start evening classes  Pt is going to start walking again now that the weather is nice  Exercise goal is 30 minutes 5 times weekly -Ozempic was really expensive before patient assistance  Plan -Continue current medications   Hypertension   Office blood pressures are  BP Readings from Last 3  Encounters:  07/30/19 114/66  04/24/19 110/72  12/20/18 126/72   Patient has failed these meds in the past: olmesartan.HCTZ Patient is currently controlled on the following medications:  -Olmesartan 67m daily  Patient checks BP at home never. Does not have a machine at home to test  We discussed: -Diet and exercise extensively -Encouraged pt to obtain BP meter if possible to monitor BP at home  Plan -Continue current medications    Hyperlipidemia   Lipid Panel     Component Value Date/Time   CHOL 148 07/30/2019 1228    TRIG 58 07/30/2019 1228   HDL 66 07/30/2019 1228   CHOLHDL 2.2 07/30/2019 1228   L[place]70 07/30/2019 1228   LABVLDL 12 07/30/2019 1228     The 10-year ASCVD risk score (Mikey BussingDC Jr., et al., 2013) is: 18.2%   Values used to calculate the score:     Age: [age]years     Sex: Female     Is Non-Hispanic African American: Yes     Diabetic: Yes     Tobacco smoker: No     Systolic Blood Pressure: 1552mmHg     Is BP treated: Yes     HDL Cholesterol: 66 mg/dL     Total Cholesterol: 148 mg/dL   Patient has failed these meds in past: N/A Patient is currently controlled on the following medications:  -Pitavastatin 257mdaily (Mon/Wed/Fri) -Aspirin 8132maily -Coenzyme Q10 100m45mily  We discussed:  -Diet and exercise extensively -Pt denies side effects with Livalo at this time -Pt was instructed to start lovastatin once weekly, but she still had samples of Livalo so she has not started lovastatin -Pt also reported that she had side effects from lovastatin in the past  Plan -Continue current medications  -Discuss with PCP whether pat should start lovastatin or continue Livalo  GERD   Patient has failed these meds in past: N/A Patient is currently controlled on the following medications:  -Pantoprazole 40mg16mly  We discussed:   -Avoid foods that may trigger symptoms  Plan -Continue current medications  Vaccines   Reviewed and discussed patient's vaccination history.    Immunization History  Administered Date(s) Administered  . DTaP 06/17/2011  . Fluad Quad(high Dose 65+) 03/19/2019  . Influenza-Unspecified 02/06/2018  . PFIZER SARS-COV-2 Vaccination 07/08/2019, 07/30/2019  . Pneumococcal Conjugate-13 05/07/2014  . Pneumococcal Polysaccharide-23 02/28/2013  . Pneumococcal-Unspecified 02/25/2013, 05/07/2015  . Zoster 04/30/2013   Plan -Recommended patient receive Shingrix vaccine in pharmacy.   Medication Management   [facility] for [facility]  medications Uses pill box? Yes Pt endorses 100% compliance  We discussed:  -Importance of medication compliance  Plan Continue current medication management strategy   Follow up: 3 month phone visit  CourtJannette FogormD Clinical Pharmacist [facility] [phone]

## 2019-10-16 ENCOUNTER — Other Ambulatory Visit: Payer: Self-pay

## 2019-10-16 MED ORDER — PIOGLITAZONE HCL-METFORMIN HCL 15-500 MG PO TABS: 1.0000 | ORAL_TABLET | Freq: Two times a day (BID) | ORAL | 2 refills | Status: DC

## 2019-10-21 NOTE — Patient Instructions (Signed)
Visit Information  Goals Addressed            This Visit's Progress   . Pharmacy Care Plan       CARE PLAN ENTRY  Current Barriers:  . Chronic Disease Management support, education, and care coordination needs related to Hypertension, Hyperlipidemia, and Diabetes   Hypertension . Pharmacist Clinical Goal(s): o Over the next 90 days, patient will work with PharmD and providers to maintain BP goal <130/80 . Current regimen:  o Olmesartan 20mg  daily . Interventions: o Increase exercise regimen to 30 minutes 5 times weekly o Maintain heart healthy, balanced diet . Patient self care activities - Over the next 90 days, patient will: o Check BP if symptomatic, document, and provide at future appointments o Ensure daily salt intake < 2300 mg/day  Hyperlipidemia . Pharmacist Clinical Goal(s): o Over the next 90 days, patient will work with PharmD and providers to maintain LDL goal < 70 . Current regimen:  o Livalo 2mg  three times weekly (Mon/Wed/Fri) . Interventions: o Will investigate patient assistance options for Livalo . Patient self care activities - Over the next 90 days, patient will: o Continue to limit fried foods o Take medications daily as directed  Diabetes . Pharmacist Clinical Goal(s): o Over the next 90 days, patient will work with PharmD and providers to maintain A1c goal <7% . Current regimen:  o Ozempic 0.5mg  weekly (Fridays) o Pioglitazone/Metformin 15/500mg  daily . Interventions: o Increase exercise to 30 minutes 5 times weekly o Encouraged continuation of diabetic-friendly, balanced diet . Patient self care activities - Over the next 90 days, patient will: o Check blood sugar once daily, document, and provide at future appointments o Contact provider with any episodes of hypoglycemia  Medication management . Pharmacist Clinical Goal(s): o Over the next 90 days, patient will work with PharmD and providers to maintain optimal medication  adherence . [facility] . Interventions o Comprehensive medication review performed. o Continue current medication management strategy . Patient self care activities - Over the next 90 days, patient will: o Focus on medication adherence by utilizing weekly pill box to organize medications o Take medications as prescribed o Report any questions or concerns to PharmD and/or provider(s)  Initial goal documentation        Ms. Tobin was given information about Chronic Care Management services today including:  1. CCM service includes personalized support from designated clinical staff supervised by her physician, including individualized plan of care and coordination with other care providers 2. 24/7 contact phone numbers for assistance for urgent and routine care needs. 3. Standard insurance, coinsurance, copays and deductibles apply for chronic care management only during months in which we provide at least 20 minutes of these services. Most insurances cover these services at 100%, however patients may be responsible for any copay, coinsurance and/or deductible if applicable. This service may help you avoid the need for more expensive face-to-face services. 4. Only one practitioner may furnish and bill the service in a calendar month. 5. The patient may stop CCM services at any time (effective at the end of the month) by phone call to the office staff.  Patient agreed to services and verbal consent obtained.   The patient verbalized understanding of instructions provided today and agreed to receive a mailed copy of patient instruction and/or educational materials. Telephone follow up appointment with pharmacy team member scheduled for: 01/14/20 @ 8:30 AM  Jannette Fogo, PharmD Clinical Pharmacist [facility]  [phone]   

## 2019-11-05 ENCOUNTER — Other Ambulatory Visit: Payer: Self-pay

## 2019-11-05 ENCOUNTER — Ambulatory Visit (INDEPENDENT_AMBULATORY_CARE_PROVIDER_SITE_OTHER): Payer: Medicare Other | Admitting: Internal Medicine

## 2019-11-05 ENCOUNTER — Encounter: Payer: Self-pay | Admitting: Internal Medicine

## 2019-11-05 VITALS — BP 122/76 | HR 86 | Temp 98.4°F | Ht 59.8 in | Wt 156.4 lb

## 2019-11-05 DIAGNOSIS — E1165 Type 2 diabetes mellitus with hyperglycemia: Secondary | ICD-10-CM | POA: Diagnosis not present

## 2019-11-05 DIAGNOSIS — N183 Chronic kidney disease, stage 3 unspecified: Secondary | ICD-10-CM

## 2019-11-05 DIAGNOSIS — Z23 Encounter for immunization: Secondary | ICD-10-CM | POA: Diagnosis not present

## 2019-11-05 DIAGNOSIS — I129 Hypertensive chronic kidney disease with stage 1 through stage 4 chronic kidney disease, or unspecified chronic kidney disease: Secondary | ICD-10-CM | POA: Diagnosis not present

## 2019-11-05 MED ORDER — SHINGRIX 50 MCG/0.5ML IM SUSR: 0.5000 mL | Freq: Once | INTRAMUSCULAR | 0 refills | Status: AC

## 2019-11-05 NOTE — Patient Instructions (Signed)
Exercising to Stay Healthy To become healthy and stay healthy, it is recommended that you do moderate-intensity and vigorous-intensity exercise. You can tell that you are exercising at a moderate intensity if your heart starts beating faster and you start breathing faster but can still hold a conversation. You can tell that you are exercising at a vigorous intensity if you are breathing much harder and faster and cannot hold a conversation while exercising. Exercising regularly is important. It has many health benefits, such as:  Improving overall fitness, flexibility, and endurance.  Increasing bone density.  Helping with weight control.  Decreasing body fat.  Increasing muscle strength.  Reducing stress and tension.  Improving overall health. How often should I exercise? Choose an activity that you enjoy, and set realistic goals. Your health care provider can help you make an activity plan that works for you. Exercise regularly as told by your health care provider. This may include:  Doing strength training two times a week, such as: ? Lifting weights. ? Using resistance bands. ? Push-ups. ? Sit-ups. ? Yoga.  Doing a certain intensity of exercise for a given amount of time. Choose from these options: ? A total of 150 minutes of moderate-intensity exercise every week. ? A total of 75 minutes of vigorous-intensity exercise every week. ? A mix of moderate-intensity and vigorous-intensity exercise every week. Children, pregnant women, people who have not exercised regularly, people who are overweight, and older adults may need to talk with a health care provider about what activities are safe to do. If you have a medical condition, be sure to talk with your health care provider before you start a new exercise program. What are some exercise ideas? Moderate-intensity exercise ideas include:  Walking 1 mile (1.6 km) in about 15  minutes.  Biking.  Hiking.  Golfing.  Dancing.  Water aerobics. Vigorous-intensity exercise ideas include:  Walking 4.5 miles (7.2 km) or more in about 1 hour.  Jogging or running 5 miles (8 km) in about 1 hour.  Biking 10 miles (16.1 km) or more in about 1 hour.  Lap swimming.  Roller-skating or in-line skating.  Cross-country skiing.  Vigorous competitive sports, such as football, basketball, and soccer.  Jumping rope.  Aerobic dancing. What are some everyday activities that can help me to get exercise?  Yard work, such as: ? Pushing a lawn mower. ? Raking and bagging leaves.  Washing your car.  Pushing a stroller.  Shoveling snow.  Gardening.  Washing windows or floors. How can I be more active in my day-to-day activities?  Use stairs instead of an elevator.  Take a walk during your lunch break.  If you drive, park your car farther away from your work or school.  If you take public transportation, get off one stop early and walk the rest of the way.  Stand up or walk around during all of your indoor phone calls.  Get up, stretch, and walk around every 30 minutes throughout the day.  Enjoy exercise with a friend. Support to continue exercising will help you keep a regular routine of activity. What guidelines can I follow while exercising?  Before you start a new exercise program, talk with your health care provider.  Do not exercise so much that you hurt yourself, feel dizzy, or get very short of breath.  Wear comfortable clothes and wear shoes with good support.  Drink plenty of water while you exercise to prevent dehydration or heat stroke.  Work out until your breathing   and your heartbeat get faster. Where to find more information  U.S. Department of Health and Human Services: www.hhs.gov  Centers for Disease Control and Prevention (CDC): www.cdc.gov Summary  Exercising regularly is important. It will improve your overall fitness,  flexibility, and endurance.  Regular exercise also will improve your overall health. It can help you control your weight, reduce stress, and improve your bone density.  Do not exercise so much that you hurt yourself, feel dizzy, or get very short of breath.  Before you start a new exercise program, talk with your health care provider. This information is not intended to replace advice given to you by your health care provider. Make sure you discuss any questions you have with your health care provider. Document Revised: 04/21/2017 Document Reviewed: 03/30/2017 Elsevier Patient Education  2020 Elsevier Inc.  

## 2019-11-05 NOTE — Progress Notes (Signed)
This visit occurred during the SARS-CoV-2 public health emergency.  Safety protocols were in place, including screening questions prior to the visit, additional usage of staff PPE, and extensive cleaning of exam room while observing appropriate contact time as indicated for disinfecting solutions.  Subjective:     Patient ID: Nicole Nunez , female    DOB: 20-Jul-1945 , 74 y.o.   MRN: [id number]   Chief Complaint  Patient presents with  . Diabetes  . Hypertension    HPI  She is here for a diabetes/htn check. She reports compliance with meds. She plans to resume going to the gym in the near future.   Diabetes She presents for her follow-up diabetic visit. She has type 2 diabetes mellitus. Her disease course has been stable. There are no hypoglycemic associated symptoms. Pertinent negatives for diabetes include no blurred vision and no chest pain. There are no hypoglycemic complications. Current diabetic treatment includes oral agent (dual therapy). She is compliant with treatment most of the time. She is following a diabetic diet. She participates in exercise intermittently. An ACE inhibitor/angiotensin II receptor blocker is being taken. Eye exam is current.  Hypertension This is a chronic problem. The current episode started more than 1 year ago. The problem has been gradually improving since onset. The problem is controlled. Pertinent negatives include no blurred vision, chest pain, palpitations or shortness of breath. Hypertensive end-organ damage includes kidney disease.     Past Medical History:  Diagnosis Date  . CKD stage 3 due to type 2 diabetes mellitus ([place])   . DM (diabetes mellitus) ([place])   . High cholesterol   . HTN (hypertension)      Family History  Problem Relation Age of Onset  . Asthma Mother   . Hypertension Mother   . Alzheimer's disease Mother   . Early death Father      Current Outpatient Medications:  .  aspirin EC 81 MG tablet, Take 81 mg by mouth  daily., Disp: , Rfl:  .  b complex vitamins tablet, Take 1 tablet by mouth daily., Disp: , Rfl:  .  Calcium Carbonate (CALCIUM 600 PO), Take 1 tablet by mouth., Disp: , Rfl:  .  Cholecalciferol (VITAMIN D3) 125 MCG (5000 UT) CAPS, Take 1 capsule by mouth., Disp: , Rfl:  .  Coenzyme Q10 100 MG TABS, Take 1 tablet by mouth daily., Disp: , Rfl:  .  Lancets (ONETOUCH DELICA PLUS [id number]) MISC, USE AS DIRECTED TO CHECK BLOOD SUGARS ONCE DAILY, Disp: 100 each, Rfl: 11 .  magnesium gluconate (MAGONATE) 500 MG tablet, Take 500 mg by mouth daily., Disp: , Rfl:  .  Multiple Vitamin (MULTIVITAMIN) tablet, Take 1 tablet by mouth daily., Disp: , Rfl:  .  olmesartan (BENICAR) 20 MG tablet, TAKE 1 TABLET BY MOUTH  DAILY, Disp: 90 tablet, Rfl: 1 .  ONETOUCH VERIO test strip, USE AS DIRECTED TO CHECK BLOOD SUGARS ONCE D, Disp: 100 each, Rfl: 11 .  OZEMPIC, 0.25 OR 0.5 MG/DOSE, 2 MG/1.5ML SOPN, INJECT SUBCUTANEOUSLY 0.5MG ONCE A WEEK ON THURSDAY, Disp: 3 pen, Rfl: 1 .  pantoprazole (PROTONIX) 40 MG tablet, TAKE 1 TABLET(40 MG) BY MOUTH DAILY, Disp: 90 tablet, Rfl: 1 .  pioglitazone-metformin (ACTOPLUS MET) 15-500 MG tablet, Take 1 tablet by mouth 2 (two) times daily., Disp: 180 tablet, Rfl: 2 .  Pitavastatin Calcium 2 MG TABS, Take 1 tablet by mouth daily. Monday, Wednesday, and Friday, Disp: , Rfl:    No Known Allergies   Review  of Systems  Constitutional: Negative.   Eyes: Negative for blurred vision.  Respiratory: Negative.  Negative for shortness of breath.   Cardiovascular: Negative.  Negative for chest pain and palpitations.  Gastrointestinal: Negative.   Neurological: Negative.   Psychiatric/Behavioral: Negative.      Today's Vitals   11/05/19 1024  BP: 122/76  Pulse: 86  Temp: 98.4 F (36.9 C)  TempSrc: Oral  Weight: 156 lb 6.4 oz (70.9 kg)  Height: 4' 11.8" (1.519 m)  PainSc: 0-No pain   Body mass index is 30.75 kg/m.   Objective:  Physical Exam Vitals and nursing note reviewed.   Constitutional:      Appearance: Normal appearance.  HENT:     Head: Normocephalic and atraumatic.  Cardiovascular:     Rate and Rhythm: Normal rate and regular rhythm.     Heart sounds: Normal heart sounds.  Pulmonary:     Effort: Pulmonary effort is normal.     Breath sounds: Normal breath sounds.  Skin:    General: Skin is warm.  Neurological:     General: No focal deficit present.     Mental Status: She is alert.  Psychiatric:        Mood and Affect: Mood normal.        Behavior: Behavior normal.         Assessment And Plan:     1. Uncontrolled type 2 diabetes mellitus with hyperglycemia (HCC)  Chronic, I will check labs as listed below. I will adjust meds as needed.   - BMP8+EGFR - Hemoglobin A1c  2. 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 She will continue with current meds. She is encouraged to avoid adding salt to her foods.   3. Stage 3 chronic kidney disease, unspecified whether stage 3a or 3b CKD  Chronic, this has been stable. I will check renal function today.   4. Immunization due  She was given rx Shingrix IM. She will get at her local pharmacy.   I personally spent 30 minutes face-to-face and non-face-to-face in the care of this patient, which includes all pre-, intra-, and post visit time on the date of service.  Maximino Greenland, MD    THE PATIENT IS ENCOURAGED TO PRACTICE SOCIAL DISTANCING DUE TO THE COVID-19 PANDEMIC.

## 2019-11-06 LAB — BMP8+EGFR
BUN/Creatinine Ratio: 14 (ref 12–28)
BUN: 17 mg/dL (ref 8–27)
CO2: 24 mmol/L (ref 20–29)
Calcium: 9.1 mg/dL (ref 8.7–10.3)
Chloride: 103 mmol/L (ref 96–106)
Creatinine, Ser: 1.21 mg/dL — ABNORMAL HIGH (ref 0.57–1.00)
GFR calc Af Amer: 52 mL/min/{1.73_m2} — ABNORMAL LOW (ref >59)
GFR calc non Af Amer: 45 mL/min/{1.73_m2} — ABNORMAL LOW (ref >59)
Glucose: 95 mg/dL (ref 65–99)
Potassium: 4.2 mmol/L (ref 3.5–5.2)
Sodium: 140 mmol/L (ref 134–144)

## 2019-11-06 LAB — HEMOGLOBIN A1C
Est. average glucose Bld gHb Est-mCnc: 131 mg/dL
Hgb A1c MFr Bld: 6.2 % — ABNORMAL HIGH (ref 4.8–5.6)

## 2019-12-13 ENCOUNTER — Other Ambulatory Visit: Payer: Self-pay | Admitting: Internal Medicine

## 2019-12-18 DIAGNOSIS — B351 Tinea unguium: Secondary | ICD-10-CM | POA: Diagnosis not present

## 2019-12-18 DIAGNOSIS — L84 Corns and callosities: Secondary | ICD-10-CM | POA: Diagnosis not present

## 2019-12-18 DIAGNOSIS — E118 Type 2 diabetes mellitus with unspecified complications: Secondary | ICD-10-CM | POA: Diagnosis not present

## 2019-12-23 ENCOUNTER — Other Ambulatory Visit: Payer: Self-pay

## 2019-12-23 MED ORDER — ONETOUCH DELICA LANCING DEV MISC: 2 refills | Status: DC

## 2020-01-01 ENCOUNTER — Ambulatory Visit: Payer: Medicare Other

## 2020-01-01 ENCOUNTER — Encounter: Payer: Medicare Other | Admitting: Internal Medicine

## 2020-01-13 NOTE — Chronic Care Management (AMB) (Signed)
Chronic Care Management Pharmacy  Name: Nicole Nunez  MRN: [id number] DOB: 1945-07-17  Chief Complaint/ HPI  Nicole Nunez,  74 y.o. , female presents for their Follow-Up CCM visit with the clinical pharmacist via telephone due to COVID-19 Pandemic. Patient just got back from a trip to [place] for her great niece's wedding.   PCP : Glendale Chard, MD  Their chronic conditions include: Hypertension, Diabetes with stage 3 CKD, Pure hypercholesterolemia  Office Visits: 11/05/19 OV: DM/HTN check. BP well controlled. Kidney function is stale. HgbA1c is stable at 6.2%. Continue current medications. Given Rx for Shingrix to receive at local pharmacy.   07/30/19 OV: Presented for DM and HTN follow up. Labs ordered (CMP14+EGFR, HgbA1c, Lipid panel). Pt mentioned hearing issues. Screening audiometry performed and was not normal. Referred to Audiology for formal audiometry testing.  05/21/19 Telephone call: Notified pt that Dr. Baird Cancer wants to start Lovastatin 50m once weekly if pt agrees  04/24/19 OV: Presented for DM and HTN follow up. Pt reported flare of back pain over the weekend. Labs ordered (HgbA1c, BMP8+EGFR). Advised to take magnesium nightly. Encouraged OTC lidocaine patches as needed. Provided dietary and exercise recommendations.   Consult Visit: 08/08/19 Ophthalmology OV: Vision is stable. Mild nonproliferative diabetic retinopathy of right eye w/ macular edema and of left eye w/o macular edema. Pseudophakia of both eyes. Vitreomacular adhesion of right eye  CCM Encounters: 06/26/19 PharmD: Comprehensive medication review performed. Consider d/c pioglitazone. Assisting with Ozempic PAP. Approved through [employer] 05/22/20.   06/20/19: Ozempic patient assistance application completed and faxed to [employer] 05/13/19 PharmD: Comprehensive medication review performed. Pt meets income requirement for Ozempic PAP through [employer] A1c controlled, consider dropping  pioglitazone from combo Actoplusmet.   Medications: Outpatient Encounter Medications as of 01/14/2020  Medication Sig   aspirin EC 81 MG tablet Take 81 mg by mouth daily.   b complex vitamins tablet Take 1 tablet by mouth daily.   Calcium Carbonate (CALCIUM 600 PO) Take 1 tablet by mouth daily.    Cholecalciferol (VITAMIN D3) 125 MCG (5000 UT) CAPS Take 1 capsule by mouth daily.    Coenzyme Q10 100 MG TABS Take 1 tablet by mouth daily.   Lancet Devices (ONE TOUCH DELICA LANCING DEV) MISC USE AS DIRECTED TO CHECK BLOOD SUGARS ONCE DAILY dx: e11.22   Lancets (ONETOUCH DELICA PLUS [id number] MISC USE AS DIRECTED TO CHECK BLOOD SUGARS ONCE DAILY   magnesium gluconate (MAGONATE) 500 MG tablet Take 500 mg by mouth daily.   Multiple Vitamin (MULTIVITAMIN) tablet Take 1 tablet by mouth daily.   olmesartan (BENICAR) 20 MG tablet TAKE 1 TABLET BY MOUTH  DAILY   ONETOUCH VERIO test strip USE AS DIRECTED TO CHECK BLOOD SUGARS ONCE D   OZEMPIC, 0.25 OR 0.5 MG/DOSE, 2 MG/1.5ML SOPN INJECT SUBCUTANEOUSLY 0.5MG ONCE A WEEK ON THURSDAY   pantoprazole (PROTONIX) 40 MG tablet TAKE 1 TABLET(40 MG) BY MOUTH DAILY   pioglitazone-metformin (ACTOPLUS MET) 15-500 MG tablet Take 1 tablet by mouth 2 (two) times daily.   Pitavastatin Calcium 2 MG TABS Take 1 tablet by mouth daily. Monday, Wednesday, and Friday   No facility-administered encounter medications on file as of 01/14/2020.    Current Diagnosis/Assessment:  SDOH Interventions     Most Recent Value  SDOH Interventions  Financial Strain Interventions Other (Comment)  [Provided patient with sampes of Livalo on 8/4]     Goals Addressed  This Visit's Progress    Pharmacy Care Plan       CARE PLAN ENTRY  Current Barriers:   Chronic Disease Management support, education, and care coordination needs related to Hypertension, Hyperlipidemia, Diabetes, and Osteopenia   Hypertension  Pharmacist Clinical Goal(s): o Over the  next 90 days, patient will work with PharmD and providers to maintain BP goal <130/80  Current regimen:  o Olmesartan 87m daily  Interventions: o Increase exercise regimen to 30 minutes 5 times weekly o Maintain heart healthy, balanced diet o Check blood pressure if symptomatic  Patient self care activities - Over the next 90 days, patient will: o Check BP if symptomatic, document, and provide at future appointments o Ensure daily salt intake < 2300 mg/day o Try to exercise for 30 minutes daily 5 times per week (150 minutes per week total)  Hyperlipidemia  Pharmacist Clinical Goal(s): o Over the next 90 days, patient will work with PharmD and providers to maintain LDL goal < 70  Current regimen:  o Livalo 291mthree times weekly (Mon/Wed/Fri)  Interventions: o Provided pt with samples of Livalo 27m63mn 8/4 (3 months supply)  Patient self care activities - Over the next 90 days, patient will: o Continue to limit fried foods o Take medications daily as directed o Try to exercise for 30 minutes daily 5 times per week (150 minutes per week total)  Diabetes  Pharmacist Clinical Goal(s): o Over the next 90 days, patient will work with PharmD and providers to maintain A1c goal <7%  Current regimen:  o Ozempic 0.5mg83mekly (Fridays) o Pioglitazone/Metformin 15/500mg44mly  Interventions: o Increase exercise to 30 minutes 5 times weekly o Encouraged continuation of diabetic-friendly, balanced diet o Sent in refill request form for patient's Ozempic on 7/26 o Coordinated with PCP staff [facility] stating hers was [facility] on 8/2  Patient self care activities - Over the next 90 days, patient will: o Check blood sugar once daily, document, and provide at future appointments o Contact provider with any episodes of hypoglycemia o Try to exercise for 30 minutes daily 5 times per week (150 minutes per week  total)  Osteopenia   Pharmacist Clinical Goal(s): o Over the next 90 days, patient will work with PharmD and providers to build and maintain bone density  Current regimen:   Cholecalciferol 5000 units daily  Calcium carbonate 600mg 4my  Interventions: o Recommend patient increase calcium supplement to twice daily for a total of 1200mg d35m o Discussed recommended calcium intake for osteopenia  o Will provide patient with resource regarding calcium content of common foods  Patient self care activities - Over the next 90 days, patient will: o Begin taking calcium 600mg tw37mdaily (if she determines she does not get additional 600mg of 327mium from diet daily)   Medication management  Pharmacist Clinical Goal(s): o Over the next 90 days, patient will work with PharmD and providers to maintain optimal medication adherence  [facility] pharmacy[facility]  Interventions o Comprehensive medication review performed. o Continue current medication management strategy  Patient self care activities - Over the next 90 days, patient will: o Focus on medication adherence by utilizing weekly pill box to organize medications o Take medications as prescribed o Report any questions or concerns to PharmD and/or provider(s)  Please see past updates related to this goal by clicking on the "Past Updates" button in the selected  goal         Diabetes   Recent Relevant Labs: Lab Results  Component Value Date/Time   HGBA1C 6.2 (H) 11/05/2019 10:51 AM   HGBA1C 6.2 (H) 07/30/2019 12:28 PM   MICROALBUR 30 12/20/2018 12:33 PM   MICROALBUR 30 08/27/2018 01:22 PM    Checking BG: Daily in the morning  Recent FBG Readings: 124 this morning Patient has failed these meds in past: N/A Patient is currently controlled on the following medications:   Ozempic 0.46m weekly  Pioglitazone/metformin 15/5038monce daily  Last diabetic Foot exam: 12/20/18 Last diabetic  Eye exam: 08/08/19  We discussed:   Diet extensively o Pt is going to try to start drinking more water daily  Exercise extensively o Pt says biggest issue right now is exercise o Going to go to a local park and start walking in the mornings (1-2 miles) o She is going to try to get into a routine with her exercise  BG in the morning is usually below 130  BG has been higher than normal recently due to pt eating at night  Patient picked up Ozempic delivery from patient assistance program in August  [facility] and stated hers was broken  Plan Continue current medications   Hypertension   Office blood pressures are  BP Readings from Last 3 Encounters:  11/05/19 122/76  07/30/19 114/66  04/24/19 110/72   Patient has failed these meds in the past: olmesartan.HCTZ Patient is currently controlled on the following medications:   Olmesartan 2012maily  Patient checks BP at home never. Does not have a machine at home to test  We discussed:  Denies headaches and dizziness  Pt said it has never been recommended that she check her BP at home  Recommend pt check if symptomatic  Plan Continue current medications   Hyperlipidemia   Lipid Panel     Component Value Date/Time   CHOL 148 07/30/2019 1228   TRIG 58 07/30/2019 1228   HDL 66 07/30/2019 1228   CHOLHDL 2.2 07/30/2019 1228   LDL[place] 07/30/2019 1228   LABVLDL 12 07/30/2019 1228     The 10-year ASCVD risk score (GoMikey Bussing Jr., et al., 2013) is: 21.7%   Values used to calculate the score:     Age: [age]     Sex: Female     Is Non-Hispanic African American: Yes     Diabetic: Yes     Tobacco smoker: No     Systolic Blood Pressure: 122528Hg     Is BP treated: Yes     HDL Cholesterol: 66 mg/dL     Total Cholesterol: 148 mg/dL   Patient has failed these meds in past: N/A Patient is currently controlled on the following  medications:   Pitavastatin 2mg29mily (Mon/Wed/Fri)  Aspirin 81mg70mly  Coenzyme Q10 100mg 17my  We discussed:   Pt picked up Livalo samples at the office on 8/4 to last for a 90 day supply  Advised pt to call a week before she runs out in order to provide her with more samples  Plan Continue current medications  Provide pt with Livalo samples as needed  GERD   Patient has failed these meds in past: N/A Patient is currently controlled on the following medications:   Pantoprazole 40mg d81m  We discussed:    Avoid foods that may trigger symptoms  Plan Continue current medications  Osteopenia / Osteoporosis   Last DEXA Scan: 04/18/18  T-Score femoral neck: -1.8  T-Score total hip:   T-Score lumbar spine:   T-Score forearm radius: -0.5  10-year probability of major osteoporotic fracture: 4.7%  10-year probability of hip fracture: 0.8%  No results found for: VD25OH   Patient is not a candidate for pharmacologic treatment  Patient has failed these meds in past: N/A Patient is currently controlled on the following medications:   Cholecalciferol 5000 units daily  Calcium carbonate 667m daily  We discussed:  Recommend 1200 mg of calcium daily from dietary and supplemental sources. Recommend weight-bearing and muscle strengthening exercises for building and maintaining bone density.  Pt says she is not getting much calcium from her die  Recommend she increase calcium supplement to twice daily  Will send calcium-content food recommendations  Plan Continue current medications  Vaccines   Reviewed and discussed patient's vaccination history.    Immunization History  Administered Date(s) Administered   DTaP 06/17/2011   Fluad Quad(high Dose 65+) 03/19/2019   Influenza-Unspecified 02/06/2018   PFIZER SARS-COV-2 Vaccination 07/08/2019, 07/30/2019   Pneumococcal Conjugate-13 05/07/2014   Pneumococcal Polysaccharide-23 02/28/2013    Pneumococcal-Unspecified 02/25/2013, 05/07/2015   Zoster 04/30/2013   We discussed:  Shingrix prescribed at office visit in June. Pt states she has not received yet  Advised pt that [facility] has her prescription for Shingrix and she will need to go ask them for it  Discussed Shingrix is a two dose series  Discussed possibility of getting a COVID booster  Plan Recommended patient receive Shingrix vaccine in pharmacy  Medication Management   [facility] Uses pill box? Yes Pt endorses 100% compliance  Plan Continue current medication management strategy   Follow up: 3 month phone visit  CJannette Fogo PharmD Clinical Pharmacist [facility] [phone]

## 2020-01-14 ENCOUNTER — Ambulatory Visit (INDEPENDENT_AMBULATORY_CARE_PROVIDER_SITE_OTHER): Payer: Medicare Other

## 2020-01-14 ENCOUNTER — Other Ambulatory Visit: Payer: Self-pay

## 2020-01-14 DIAGNOSIS — I129 Hypertensive chronic kidney disease with stage 1 through stage 4 chronic kidney disease, or unspecified chronic kidney disease: Secondary | ICD-10-CM | POA: Diagnosis not present

## 2020-01-14 DIAGNOSIS — E7800 Pure hypercholesterolemia, unspecified: Secondary | ICD-10-CM

## 2020-01-14 DIAGNOSIS — N183 Chronic kidney disease, stage 3 unspecified: Secondary | ICD-10-CM

## 2020-01-14 DIAGNOSIS — E1122 Type 2 diabetes mellitus with diabetic chronic kidney disease: Secondary | ICD-10-CM

## 2020-01-14 NOTE — Patient Instructions (Addendum)
Visit Information  Goals Addressed            This Visit's Progress   . Pharmacy Care Plan       CARE PLAN ENTRY  Current Barriers:  . Chronic Disease Management support, education, and care coordination needs related to Hypertension, Hyperlipidemia, Diabetes, and Osteopenia   Hypertension . Pharmacist Clinical Goal(s): o Over the next 90 days, patient will work with PharmD and providers to maintain BP goal <130/80 . Current regimen:  o Olmesartan 20mg  daily . Interventions: o Increase exercise regimen to 30 minutes 5 times weekly o Maintain heart healthy, balanced diet o Check blood pressure if symptomatic . Patient self care activities - Over the next 90 days, patient will: o Check BP if symptomatic, document, and provide at future appointments o Ensure daily salt intake < 2300 mg/day o Try to exercise for 30 minutes daily 5 times per week (150 minutes per week total)  Hyperlipidemia . Pharmacist Clinical Goal(s): o Over the next 90 days, patient will work with PharmD and providers to maintain LDL goal < 70 . Current regimen:  o Livalo 2mg  three times weekly (Mon/Wed/Fri) . Interventions: o Provided pt with samples of Livalo 2mg  on 8/4 (3 months supply) . Patient self care activities - Over the next 90 days, patient will: o Continue to limit fried foods o Take medications daily as directed o Try to exercise for 30 minutes daily 5 times per week (150 minutes per week total)  Diabetes . Pharmacist Clinical Goal(s): o Over the next 90 days, patient will work with PharmD and providers to maintain A1c goal <7% . Current regimen:  o Ozempic 0.5mg  weekly (Fridays) o Pioglitazone/Metformin 15/500mg  daily . Interventions: o Increase exercise to 30 minutes 5 times weekly o Encouraged continuation of diabetic-friendly, balanced diet o Sent in refill request form for patient's Ozempic on 7/26 o Coordinated with PCP staff a new prescription for OneTouch lancing device sent in  to [facility] after patient called stating hers was broken on 8/2 . Patient self care activities - Over the next 90 days, patient will: o Check blood sugar once daily, document, and provide at future appointments o Contact provider with any episodes of hypoglycemia o Try to exercise for 30 minutes daily 5 times per week (150 minutes per week total)  Osteopenia  . Pharmacist Clinical Goal(s): o Over the next 90 days, patient will work with PharmD and providers to build and maintain bone density . Current regimen:  . Cholecalciferol 5000 units daily . Calcium carbonate 600mg  daily . Interventions: o Recommend patient increase calcium supplement to twice daily for a total of 1200mg  daily o Discussed recommended calcium intake for osteopenia  o Will provide patient with resource regarding calcium content of common foods . Patient self care activities - Over the next 90 days, patient will: o Begin taking calcium 600mg  twice daily (if she determines she does not get additional 600mg  of calcium from diet daily)   Medication management . Pharmacist Clinical Goal(s): o Over the next 90 days, patient will work with PharmD and providers to maintain optimal medication adherence . Current pharmacy: [facility] . Interventions o Comprehensive medication review performed. o Continue current medication management strategy . Patient self care activities - Over the next 90 days, patient will: o Focus on medication adherence by utilizing weekly pill box to organize medications o Take medications as prescribed o Report any questions or concerns to PharmD and/or provider(s)  Please see past  updates related to this goal by clicking on the "Past Updates" button in the selected goal         The patient verbalized understanding of instructions provided today and agreed to receive a mailed copy of patient instruction and/or educational materials.  Telephone follow up  appointment with pharmacy team member scheduled for: 04/20/20 @ 3:30 PM  Jannette Fogo, PharmD Clinical Pharmacist [facility] [phone]   Calcium Content in Foods Calcium is the most abundant mineral in your body. Most of your body's calcium supply is stored in your bones and teeth. Calcium helps many parts of the body function normally, including:  Blood and blood vessels.  Nerves.  Hormones.  Muscles.  Bones and teeth. When your calcium stores are low, you may be at risk for low bone mass, bone loss, and broken bones (fractures). When you get enough calcium, it helps to support strong bones and teeth throughout your life. Calcium is especially important for:  Children during growth spurts.  Girls during adolescence.  Women who are pregnant or breastfeeding.  Women after their menstrual cycle stops (postmenopause).  Women whose menstrual cycle has stopped due to anorexia nervosa or regular intense exercise.  People who cannot eat or digest dairy products.  Vegans. What are tips for getting more calcium? General information  Try to get most of your calcium from food. Eat foods that are high in calcium.  Some people may benefit from taking calcium supplements. Check with your health care provider or diet and nutrition specialist (dietitian) before starting any calcium supplements. Calcium supplements may interact with certain medicines. Too much calcium may cause other health problems, like constipation and kidney stones.  For the body to absorb calcium, it needs vitamin D. Sources of vitamin D include: ? Skin exposure to direct sunlight. ? Foods, such as egg yolks, liver, saltwater fish, and fortified milk. ? Vitamin D supplements. Check with your health care provider or dietitian before starting any vitamin D supplements. What foods are high in calcium?  High-calcium foods are those that contain more than 100 milligrams (mg) of calcium  per serving. Fruits  Fortified orange or other fruit juice, 300 mg per 8 oz serving. Vegetables  Collard greens, 360 mg per 8 oz serving.  Kale, 180 mg per 8 oz serving.  Bok choy, 160 mg per 8 oz serving. Grains  Fortified ready-to-eat cereals, 100-1,000 mg per 8 oz serving.  Fortified frozen waffles, 200 mg in two waffles. Meats and other proteins  Sardines, canned with bones, 325 mg per 3 oz serving.  Salmon, canned with bones, 180 mg per 3 oz serving.  Canned shrimp, 125 mg per 3 oz serving.  Baked beans, 160 mg per 4 oz serving. Dairy  Yogurt, plain, low-fat, 310 mg per 6 oz serving.  Milk, 300 mg per 8 oz serving.  American cheese, 195 mg per 1 oz serving.  Cheddar cheese, 205 mg per 1 oz serving.  Cottage cheese 2%, 105 mg per 4 oz serving.  Fortified soy, rice, or almond milk, 300 mg per 8 oz serving. The items listed above may not be a complete list of foods high in calcium. Actual amounts of calcium may be different depending on processing. Contact a dietitian for more information. What foods are lower in calcium? Foods lower in calcium are those that contain 50 mg of calcium or less per serving. Fruits  Apple, about 6 mg in one apple.  Banana, about 12 mg in one banana. Vegetables  Lettuce, 19 mg per 2 oz serving.  Tomato, about 11 mg in one tomato. Grains  Rice, 4 mg per 6 oz serving.  Boiled potatoes, 14 mg per 8 oz serving.  White bread, 6 mg in one slice. Meats and other proteins  Egg, 27 mg per 2 oz serving.  Red meat, 7 mg per 4 oz serving.  Chicken, 17 mg per 4 oz serving.  Fish, cod or trout, 20 mg per 4 oz serving. The items listed above may not be a complete list of foods lower in calcium. Actual amounts of calcium may be different depending on processing. Contact a dietitian for more information. Summary  Calcium is an important mineral in the body because it affects many functions. Getting enough calcium helps support strong  bones and teeth throughout your life.  Try to get most of your calcium from food.  Calcium supplements may interact with certain medicines. Check with your health care provider before starting any calcium supplements. This information is not intended to replace advice given to you by your health care provider. Make sure you discuss any questions you have with your health care provider. Document Revised: 05/02/2017 Document Reviewed: 05/02/2017 Elsevier Patient Education  2020 [employer].

## 2020-01-20 ENCOUNTER — Other Ambulatory Visit: Payer: Self-pay | Admitting: Internal Medicine

## 2020-01-29 ENCOUNTER — Encounter: Payer: Self-pay | Admitting: Internal Medicine

## 2020-01-29 ENCOUNTER — Other Ambulatory Visit: Payer: Self-pay

## 2020-01-29 ENCOUNTER — Ambulatory Visit (INDEPENDENT_AMBULATORY_CARE_PROVIDER_SITE_OTHER): Payer: Medicare Other | Admitting: Internal Medicine

## 2020-01-29 ENCOUNTER — Ambulatory Visit (INDEPENDENT_AMBULATORY_CARE_PROVIDER_SITE_OTHER): Payer: Medicare Other

## 2020-01-29 VITALS — BP 136/70 | HR 83 | Temp 98.1°F | Ht 58.4 in | Wt 158.6 lb

## 2020-01-29 DIAGNOSIS — M549 Dorsalgia, unspecified: Secondary | ICD-10-CM | POA: Insufficient documentation

## 2020-01-29 DIAGNOSIS — Z79899 Other long term (current) drug therapy: Secondary | ICD-10-CM

## 2020-01-29 DIAGNOSIS — N183 Chronic kidney disease, stage 3 unspecified: Secondary | ICD-10-CM | POA: Diagnosis not present

## 2020-01-29 DIAGNOSIS — I129 Hypertensive chronic kidney disease with stage 1 through stage 4 chronic kidney disease, or unspecified chronic kidney disease: Secondary | ICD-10-CM | POA: Diagnosis not present

## 2020-01-29 DIAGNOSIS — E559 Vitamin D deficiency, unspecified: Secondary | ICD-10-CM

## 2020-01-29 DIAGNOSIS — Z0000 Encounter for general adult medical examination without abnormal findings: Secondary | ICD-10-CM | POA: Diagnosis not present

## 2020-01-29 DIAGNOSIS — E1122 Type 2 diabetes mellitus with diabetic chronic kidney disease: Secondary | ICD-10-CM | POA: Diagnosis not present

## 2020-01-29 DIAGNOSIS — Z23 Encounter for immunization: Secondary | ICD-10-CM

## 2020-01-29 DIAGNOSIS — E6609 Other obesity due to excess calories: Secondary | ICD-10-CM

## 2020-01-29 DIAGNOSIS — E66811 Obesity, class 1: Secondary | ICD-10-CM | POA: Insufficient documentation

## 2020-01-29 DIAGNOSIS — Z6832 Body mass index (BMI) 32.0-32.9, adult: Secondary | ICD-10-CM

## 2020-01-29 LAB — POCT URINALYSIS DIPSTICK
Bilirubin, UA: NEGATIVE
Blood, UA: NEGATIVE
Glucose, UA: NEGATIVE
Ketones, UA: NEGATIVE
Nitrite, UA: NEGATIVE
Protein, UA: NEGATIVE
Spec Grav, UA: 1.025 (ref 1.010–1.025)
Urobilinogen, UA: 0.2 E.U./dL
pH, UA: 7 (ref 5.0–8.0)

## 2020-01-29 LAB — POCT UA - MICROALBUMIN
Albumin/Creatinine Ratio, Urine, POC: <30
Creatinine, POC: 300 mg/dL
Microalbumin Ur, POC: 10 mg/L

## 2020-01-29 NOTE — Progress Notes (Signed)
I,Katawbba Wiggins,acting as a Education administrator for Maximino Greenland, MD.,have documented all relevant documentation on the behalf of Maximino Greenland, MD,as directed by  Maximino Greenland, MD while in the presence of Maximino Greenland, MD.  This visit occurred during the SARS-CoV-2 public health emergency.  Safety protocols were in place, including screening questions prior to the visit, additional usage of staff PPE, and extensive cleaning of exam room while observing appropriate contact time as indicated for disinfecting solutions.  Subjective:     Patient ID: Nicole Nunez , female    DOB: 04-10-1946 , 74 y.o.   MRN: [id number]   Chief Complaint  Patient presents with   Annual Exam   Diabetes   Hypertension    HPI  She is here today for a full physical examination. She is no longer followed by GYN. She has no specific concerns or complaints at this time.   Diabetes She presents for her follow-up diabetic visit. She has type 2 diabetes mellitus. Her disease course has been stable. There are no hypoglycemic associated symptoms. Pertinent negatives for diabetes include no blurred vision and no chest pain. There are no hypoglycemic complications. She is following a diabetic diet. She has not had a previous visit with a dietitian. She participates in exercise three times a week. An ACE inhibitor/angiotensin II receptor blocker is being taken. Eye exam is current.  Hypertension This is a chronic problem. The current episode started more than 1 year ago. The problem has been gradually improving since onset. The problem is controlled. Pertinent negatives include no blurred vision, chest pain, palpitations or shortness of breath. The current treatment provides moderate improvement. Hypertensive end-organ damage includes kidney disease.     Past Medical History:  Diagnosis Date   CKD stage 3 due to type 2 diabetes mellitus (HCC)    DM (diabetes mellitus) ([place])    High cholesterol    HTN  (hypertension)      Family History  Problem Relation Age of Onset   Asthma Mother    Hypertension Mother    Alzheimer's disease Mother    Early death Father      Current Outpatient Medications:    aspirin EC 81 MG tablet, Take 81 mg by mouth daily., Disp: , Rfl:    b complex vitamins tablet, Take 1 tablet by mouth daily., Disp: , Rfl:    Calcium Carbonate (CALCIUM 600 PO), Take 1 tablet by mouth daily. , Disp: , Rfl:    Cholecalciferol (VITAMIN D3) 125 MCG (5000 UT) CAPS, Take 1 capsule by mouth daily. , Disp: , Rfl:    Coenzyme Q10 100 MG TABS, Take 1 tablet by mouth daily., Disp: , Rfl:    Lancet Devices (ONE TOUCH DELICA LANCING DEV) MISC, USE AS DIRECTED TO CHECK BLOOD SUGARS ONCE DAILY dx: e11.22, Disp: 1 each, Rfl: 2   Lancets (ONETOUCH DELICA PLUS ELFYBO17P) MISC, USE AS DIRECTED TO CHECK BLOOD SUGARS ONCE DAILY, Disp: 100 each, Rfl: 11   magnesium gluconate (MAGONATE) 500 MG tablet, Take 500 mg by mouth daily., Disp: , Rfl:    Multiple Vitamin (MULTIVITAMIN) tablet, Take 1 tablet by mouth daily., Disp: , Rfl:    olmesartan (BENICAR) 20 MG tablet, TAKE 1 TABLET BY MOUTH  DAILY, Disp: 90 tablet, Rfl: 1   ONETOUCH VERIO test strip, USE AS DIRECTED TO CHECK BLOOD SUGARS ONCE D, Disp: 100 each, Rfl: 11   OZEMPIC, 0.25 OR 0.5 MG/DOSE, 2 MG/1.5ML SOPN, INJECT SUBCUTANEOUSLY 0.5MG ONCE A WEEK  ON THURSDAY, Disp: 3 pen, Rfl: 1   pantoprazole (PROTONIX) 40 MG tablet, TAKE 1 TABLET(40 MG) BY MOUTH DAILY, Disp: 90 tablet, Rfl: 1   pioglitazone-metformin (ACTOPLUS MET) 15-500 MG tablet, Take 1 tablet by mouth 2 (two) times daily., Disp: 180 tablet, Rfl: 2   Pitavastatin Calcium 2 MG TABS, Take 1 tablet by mouth daily. Monday, Wednesday, and Friday, Disp: , Rfl:    No Known Allergies    The patient states she uses post menopausal status for birth control. Last LMP was No LMP recorded. Patient is postmenopausal.. Negative for Dysmenorrhea. Negative for: breast discharge,  breast lump(s), breast pain and breast self exam. Associated symptoms include abnormal vaginal bleeding. Pertinent negatives include abnormal bleeding (hematology), anxiety, decreased libido, depression, difficulty falling sleep, dyspareunia, history of infertility, nocturia, sexual dysfunction, sleep disturbances, urinary incontinence, urinary urgency, vaginal discharge and vaginal itching. Diet regular.The patient states her exercise level is  intermittent.   . The patient's tobacco use is:  Social History   Tobacco Use  Smoking Status Never Smoker  Smokeless Tobacco Never Used  . She has been exposed to passive smoke. The patient's alcohol use is:  Social History   Substance and Sexual Activity  Alcohol Use Never    Review of Systems  Constitutional: Negative.   HENT: Negative.   Eyes: Negative.  Negative for blurred vision.  Respiratory: Negative.  Negative for shortness of breath.   Cardiovascular: Negative.  Negative for chest pain and palpitations.  Gastrointestinal: Negative.   Endocrine: Negative.   Genitourinary: Negative.   Musculoskeletal: Negative.   Skin: Negative.   Allergic/Immunologic: Negative.   Neurological: Negative.   Hematological: Negative.   Psychiatric/Behavioral: Negative.      Today's Vitals   01/29/20 1200  BP: 136/70  Pulse: 83  Temp: 98.1 F (36.7 C)  TempSrc: Oral  Weight: 158 lb 9.6 oz (71.9 kg)  Height: 4' 10.4" (1.483 m)  PainSc: 0-No pain   Body mass index is 32.69 kg/m.  Wt Readings from Last 3 Encounters:  01/29/20 158 lb 9.6 oz (71.9 kg)  01/29/20 158 lb 9.6 oz (71.9 kg)  11/05/19 156 lb 6.4 oz (70.9 kg)   Objective:  Physical Exam Vitals and nursing note reviewed.  Constitutional:      General: She is not in acute distress.    Appearance: Normal appearance. She is well-developed. She is obese.  HENT:     Head: Normocephalic and atraumatic.     Right Ear: Hearing, tympanic membrane, ear canal and external ear normal.  There is no impacted cerumen.     Left Ear: Hearing, tympanic membrane, ear canal and external ear normal. There is no impacted cerumen.     Nose:     Comments: Deferred, masked    Mouth/Throat:     Comments: Deferred, masked Eyes:     General: Lids are normal.     Extraocular Movements: Extraocular movements intact.     Conjunctiva/sclera: Conjunctivae normal.     Pupils: Pupils are equal, round, and reactive to light.     Funduscopic exam:    Right eye: No papilledema.        Left eye: No papilledema.  Neck:     Thyroid: No thyroid mass.     Vascular: No carotid bruit.  Cardiovascular:     Rate and Rhythm: Normal rate and regular rhythm.     Pulses: Normal pulses.          Dorsalis pedis pulses are 2+ on the  right side and 2+ on the left side.     Heart sounds: Normal heart sounds. No murmur heard.   Pulmonary:     Effort: Pulmonary effort is normal.     Breath sounds: Normal breath sounds.  Abdominal:     General: Bowel sounds are normal. There is no distension.     Palpations: Abdomen is soft.     Tenderness: There is no abdominal tenderness.  Genitourinary:    Comments: Deferred Musculoskeletal:        General: No swelling. Normal range of motion.     Cervical back: Full passive range of motion without pain, normal range of motion and neck supple.     Right lower leg: No edema.     Left lower leg: No edema.  Feet:     Right foot:     Protective Sensation: 5 sites tested. 5 sites sensed.     Skin integrity: Dry skin present.     Toenail Condition: Right toenails are normal.     Left foot:     Protective Sensation: 5 sites tested. 5 sites sensed.     Skin integrity: Dry skin present.     Toenail Condition: Left toenails are normal.  Skin:    General: Skin is warm and dry.     Capillary Refill: Capillary refill takes less than 2 seconds.  Neurological:     General: No focal deficit present.     Mental Status: She is alert and oriented to person, place, and time.      Cranial Nerves: No cranial nerve deficit.     Sensory: No sensory deficit.  Psychiatric:        Mood and Affect: Mood normal.        Behavior: Behavior normal.        Thought Content: Thought content normal.        Judgment: Judgment normal.         Assessment And Plan:     1. Routine general medical examination at health care facility Comments: A full exam was performed. Importnace of monthly self breast exams was discussed with the patient. PATIENT IS ADVISED TO GET 30-45 MINUTES REGULAR EXERCISE NO LESS THAN FOUR TO FIVE DAYS PER WEEK - BOTH WEIGHTBEARING EXERCISES AND AEROBIC ARE RECOMMENDED.  PATIENT IS ADVISED TO FOLLOW A HEALTHY DIET WITH AT LEAST SIX FRUITS/VEGGIES PER DAY, DECREASE INTAKE OF RED MEAT, AND TO INCREASE FISH INTAKE TO TWO DAYS PER WEEK.  MEATS/FISH SHOULD NOT BE FRIED, BAKED OR BROILED IS PREFERABLE.  I SUGGEST WEARING SPF 50 SUNSCREEN ON EXPOSED PARTS AND ESPECIALLY WHEN IN THE DIRECT SUNLIGHT FOR AN EXTENDED PERIOD OF TIME.  PLEASE AVOID FAST FOOD RESTAURANTS AND INCREASE YOUR WATER INTAKE.   2. Type 2 diabetes mellitus with stage 3 chronic kidney disease, without long-term current use of insulin, unspecified whether stage 3a or 3b CKD ([place]) Comments: Diabetic foot exam was performed. I DISCUSSED WITH THE PATIENT AT LENGTH REGARDING THE GOALS OF GLYCEMIC CONTROL AND POSSIBLE LONG-TERM COMPLICATIONS.  I  ALSO STRESSED THE IMPORTANCE OF COMPLIANCE WITH HOME GLUCOSE MONITORING, DIETARY RESTRICTIONS INCLUDING AVOIDANCE OF SUGARY DRINKS/PROCESSED FOODS,  ALONG WITH REGULAR EXERCISE.  I  ALSO STRESSED THE IMPORTANCE OF ANNUAL EYE EXAMS, SELF FOOT CARE AND COMPLIANCE WITH OFFICE VISITS.  - Hemoglobin A1c - CBC - CMP14+EGFR  3. Parenchymal renal hypertension, stage 1 through stage 4 or unspecified chronic kidney disease Comments: Fair control. Goal BP is less than 130/80. Encouraged to avoid adding  salt to her foods. EKG performed, NSR w/o acute changes. She will rto in  4 months for re-evaluation.  - EKG 12-Lead  4. Vitamin D deficiency disease Comments: I will check a vitamin D level and supplement as needed.  - VITAMIN D 25 Hydroxy (Vit-D Deficiency, Fractures)  5. Class 1 obesity due to excess calories with serious comorbidity and body mass index (BMI) of 32.0 to 32.9 in adult Comments: She is encouraged to strive for BMI less than 30 to decrease cardiac risk. importance of regular exercise was discussed with the patient.   6. Drug therapy - Vitamin B12 She is encouraged to strive for BMI less than 30 to decrease cardiac risk. Advised to aim for at least 150 minutes of exercise per week.    Patient was given opportunity to ask questions. Patient verbalized understanding of the plan and was able to repeat key elements of the plan. All questions were answered to their satisfaction.   Maximino Greenland, MD   I, Maximino Greenland, MD, have reviewed all documentation for this visit. The documentation on 02/02/20 for the exam, diagnosis, procedures, and orders are all accurate and complete.  THE PATIENT IS ENCOURAGED TO PRACTICE SOCIAL DISTANCING DUE TO THE COVID-19 PANDEMIC.

## 2020-01-29 NOTE — Addendum Note (Signed)
Addended by: Glenna Durand E on: 01/29/2020 12:53 PM   Modules accepted: Orders

## 2020-01-29 NOTE — Addendum Note (Signed)
Addended by: Kellie Simmering on: 01/29/2020 04:34 PM   Modules accepted: Orders

## 2020-01-29 NOTE — Patient Instructions (Signed)
Health Maintenance, Female Adopting a healthy lifestyle and getting preventive care are important in promoting health and wellness. Ask your health care provider about:  The right schedule for you to have regular tests and exams.  Things you can do on your own to prevent diseases and keep yourself healthy. What should I know about diet, weight, and exercise? Eat a healthy diet   Eat a diet that includes plenty of vegetables, fruits, low-fat dairy products, and lean protein.  Do not eat a lot of foods that are high in solid fats, added sugars, or sodium. Maintain a healthy weight Body mass index (BMI) is used to identify weight problems. It estimates body fat based on height and weight. Your health care provider can help determine your BMI and help you achieve or maintain a healthy weight. Get regular exercise Get regular exercise. This is one of the most important things you can do for your health. Most adults should:  Exercise for at least 150 minutes each week. The exercise should increase your heart rate and make you sweat (moderate-intensity exercise).  Do strengthening exercises at least twice a week. This is in addition to the moderate-intensity exercise.  Spend less time sitting. Even light physical activity can be beneficial. Watch cholesterol and blood lipids Have your blood tested for lipids and cholesterol at 72 years of age, then have this test every 5 years. Have your cholesterol levels checked more often if:  Your lipid or cholesterol levels are high.  You are older than 72 years of age.  You are at high risk for heart disease. What should I know about cancer screening? Depending on your health history and family history, you may need to have cancer screening at various ages. This may include screening for:  Breast cancer.  Cervical cancer.  Colorectal cancer.  Skin cancer.  Lung cancer. What should I know about heart disease, diabetes, and high blood  pressure? Blood pressure and heart disease  High blood pressure causes heart disease and increases the risk of stroke. This is more likely to develop in people who have high blood pressure readings, are of African descent, or are overweight.  Have your blood pressure checked: ? Every 3-5 years if you are 18-39 years of age. ? Every year if you are 40 years old or older. Diabetes Have regular diabetes screenings. This checks your fasting blood sugar level. Have the screening done:  Once every three years after age 40 if you are at a normal weight and have a low risk for diabetes.  More often and at a younger age if you are overweight or have a high risk for diabetes. What should I know about preventing infection? Hepatitis B If you have a higher risk for hepatitis B, you should be screened for this virus. Talk with your health care provider to find out if you are at risk for hepatitis B infection. Hepatitis C Testing is recommended for:  Everyone born from 1945 through 1965.  Anyone with known risk factors for hepatitis C. Sexually transmitted infections (STIs)  Get screened for STIs, including gonorrhea and chlamydia, if: ? You are sexually active and are younger than 72 years of age. ? You are older than 72 years of age and your health care provider tells you that you are at risk for this type of infection. ? Your sexual activity has changed since you were last screened, and you are at increased risk for chlamydia or gonorrhea. Ask your health care provider if   you are at risk.  Ask your health care provider about whether you are at high risk for HIV. Your health care provider may recommend a prescription medicine to help prevent HIV infection. If you choose to take medicine to prevent HIV, you should first get tested for HIV. You should then be tested every 3 months for as long as you are taking the medicine. Pregnancy  If you are about to stop having your period (premenopausal) and  you may become pregnant, seek counseling before you get pregnant.  Take 400 to 800 micrograms (mcg) of folic acid every day if you become pregnant.  Ask for birth control (contraception) if you want to prevent pregnancy. Osteoporosis and menopause Osteoporosis is a disease in which the bones lose minerals and strength with aging. This can result in bone fractures. If you are 65 years old or older, or if you are at risk for osteoporosis and fractures, ask your health care provider if you should:  Be screened for bone loss.  Take a calcium or vitamin D supplement to lower your risk of fractures.  Be given hormone replacement therapy (HRT) to treat symptoms of menopause. Follow these instructions at home: Lifestyle  Do not use any products that contain nicotine or tobacco, such as cigarettes, e-cigarettes, and chewing tobacco. If you need help quitting, ask your health care provider.  Do not use street drugs.  Do not share needles.  Ask your health care provider for help if you need support or information about quitting drugs. Alcohol use  Do not drink alcohol if: ? Your health care provider tells you not to drink. ? You are pregnant, may be pregnant, or are planning to become pregnant.  If you drink alcohol: ? Limit how much you use to 0-1 drink a day. ? Limit intake if you are breastfeeding.  Be aware of how much alcohol is in your drink. In the [place] one drink equals one 12 oz bottle of beer (355 mL), one 5 oz glass of wine (148 mL), or one 1 oz glass of hard liquor (44 mL). General instructions  Schedule regular health, dental, and eye exams.  Stay current with your vaccines.  Tell your health care provider if: ? You often feel depressed. ? You have ever been abused or do not feel safe at home. Summary  Adopting a healthy lifestyle and getting preventive care are important in promoting health and wellness.  Follow your health care provider's instructions about healthy  diet, exercising, and getting tested or screened for diseases.  Follow your health care provider's instructions on monitoring your cholesterol and blood pressure. This information is not intended to replace advice given to you by your health care provider. Make sure you discuss any questions you have with your health care provider. Document Revised: 05/02/2018 Document Reviewed: 05/02/2018 Elsevier Patient Education  2020 [employer]  

## 2020-01-29 NOTE — Patient Instructions (Signed)
Nicole Nunez , Thank you for taking time to come for your Medicare Wellness Visit. I appreciate your ongoing commitment to your health goals. Please review the following plan we discussed and let me know if I can assist you in the future.   Screening recommendations/referrals: Colonoscopy: completed 12/18/2015 Mammogram: patient to schedule Bone Density: completed 04/18/2018 Recommended yearly ophthalmology/optometry visit for glaucoma screening and checkup Recommended yearly dental visit for hygiene and checkup  Vaccinations: Influenza vaccine: today Pneumococcal vaccine: completed 05/07/2015 Tdap vaccine: completed 06/17/2011 Shingles vaccine: discussed   Covid-19: 07/30/2019, 07/08/2019  Advanced directives: Advance directive discussed with you today. I have provided a copy for you to complete at home and have notarized. Once this is complete please bring a copy in to our office so we can scan it into your chart.  Conditions/risks identified: none  Next appointment: Follow up in one year for your annual wellness visit    Preventive Care 65 Years and Older, Female Preventive care refers to lifestyle choices and visits with your health care provider that can promote health and wellness. What does preventive care include?  A yearly physical exam. This is also called an annual well check.  Dental exams once or twice a year.  Routine eye exams. Ask your health care provider how often you should have your eyes checked.  Personal lifestyle choices, including:  Daily care of your teeth and gums.  Regular physical activity.  Eating a healthy diet.  Avoiding tobacco and drug use.  Limiting alcohol use.  Practicing safe sex.  Taking low-dose aspirin every day.  Taking vitamin and mineral supplements as recommended by your health care provider. What happens during an annual well check? The services and screenings done by your health care provider during your annual well check  will depend on your age, overall health, lifestyle risk factors, and family history of disease. Counseling  Your health care provider may ask you questions about your:  Alcohol use.  Tobacco use.  Drug use.  Emotional well-being.  Home and relationship well-being.  Sexual activity.  Eating habits.  History of falls.  Memory and ability to understand (cognition).  Work and work Statistician.  Reproductive health. Screening  You may have the following tests or measurements:  Height, weight, and BMI.  Blood pressure.  Lipid and cholesterol levels. These may be checked every 5 years, or more frequently if you are over 39 years old.  Skin check.  Lung cancer screening. You may have this screening every year starting at age 69 if you have a 30-pack-year history of smoking and currently smoke or have quit within the past 15 years.  Fecal occult blood test (FOBT) of the stool. You may have this test every year starting at age 43.  Flexible sigmoidoscopy or colonoscopy. You may have a sigmoidoscopy every 5 years or a colonoscopy every 10 years starting at age 77.  Hepatitis C blood test.  Hepatitis B blood test.  Sexually transmitted disease (STD) testing.  Diabetes screening. This is done by checking your blood sugar (glucose) after you have not eaten for a while (fasting). You may have this done every 1-3 years.  Bone density scan. This is done to screen for osteoporosis. You may have this done starting at age 63.  Mammogram. This may be done every 1-2 years. Talk to your health care provider about how often you should have regular mammograms. Talk with your health care provider about your test results, treatment options, and if necessary, the need  for more tests. Vaccines  Your health care provider may recommend certain vaccines, such as:  Influenza vaccine. This is recommended every year.  Tetanus, diphtheria, and acellular pertussis (Tdap, Td) vaccine. You may  need a Td booster every 10 years.  Zoster vaccine. You may need this after age 23.  Pneumococcal 13-valent conjugate (PCV13) vaccine. One dose is recommended after age 72.  Pneumococcal polysaccharide (PPSV23) vaccine. One dose is recommended after age 68. Talk to your health care provider about which screenings and vaccines you need and how often you need them. This information is not intended to replace advice given to you by your health care provider. Make sure you discuss any questions you have with your health care provider. Document Released: 06/05/2015 Document Revised: 01/27/2016 Document Reviewed: 03/10/2015 Elsevier Interactive Patient Education  2017 [employer] Prevention in the Home Falls can cause injuries. They can happen to people of all ages. There are many things you can do to make your home safe and to help prevent falls. What can I do on the outside of my home?  Regularly fix the edges of walkways and driveways and fix any cracks.  Remove anything that might make you trip as you walk through a door, such as a raised step or threshold.  Trim any bushes or trees on the path to your home.  Use bright outdoor lighting.  Clear any walking paths of anything that might make someone trip, such as rocks or tools.  Regularly check to see if handrails are loose or broken. Make sure that both sides of any steps have handrails.  Any raised decks and porches should have guardrails on the edges.  Have any leaves, snow, or ice cleared regularly.  Use sand or salt on walking paths during winter.  Clean up any spills in your garage right away. This includes oil or grease spills. What can I do in the bathroom?  Use night lights.  Install grab bars by the toilet and in the tub and shower. Do not use towel bars as grab bars.  Use non-skid mats or decals in the tub or shower.  If you need to sit down in the shower, use a plastic, non-slip stool.  Keep the floor  dry. Clean up any water that spills on the floor as soon as it happens.  Remove soap buildup in the tub or shower regularly.  Attach bath mats securely with double-sided non-slip rug tape.  Do not have throw rugs and other things on the floor that can make you trip. What can I do in the bedroom?  Use night lights.  Make sure that you have a light by your bed that is easy to reach.  Do not use any sheets or blankets that are too big for your bed. They should not hang down onto the floor.  Have a firm chair that has side arms. You can use this for support while you get dressed.  Do not have throw rugs and other things on the floor that can make you trip. What can I do in the kitchen?  Clean up any spills right away.  Avoid walking on wet floors.  Keep items that you use a lot in easy-to-reach places.  If you need to reach something above you, use a strong step stool that has a grab bar.  Keep electrical cords out of the way.  Do not use floor polish or wax that makes floors slippery. If you must use wax, use  non-skid floor wax.  Do not have throw rugs and other things on the floor that can make you trip. What can I do with my stairs?  Do not leave any items on the stairs.  Make sure that there are handrails on both sides of the stairs and use them. Fix handrails that are broken or loose. Make sure that handrails are as long as the stairways.  Check any carpeting to make sure that it is firmly attached to the stairs. Fix any carpet that is loose or worn.  Avoid having throw rugs at the top or bottom of the stairs. If you do have throw rugs, attach them to the floor with carpet tape.  Make sure that you have a light switch at the top of the stairs and the bottom of the stairs. If you do not have them, ask someone to add them for you. What else can I do to help prevent falls?  Wear shoes that:  Do not have high heels.  Have rubber bottoms.  Are comfortable and fit you  well.  Are closed at the toe. Do not wear sandals.  If you use a stepladder:  Make sure that it is fully opened. Do not climb a closed stepladder.  Make sure that both sides of the stepladder are locked into place.  Ask someone to hold it for you, if possible.  Clearly mark and make sure that you can see:  Any grab bars or handrails.  First and last steps.  Where the edge of each step is.  Use tools that help you move around (mobility aids) if they are needed. These include:  Canes.  Walkers.  Scooters.  Crutches.  Turn on the lights when you go into a dark area. Replace any light bulbs as soon as they burn out.  Set up your furniture so you have a clear path. Avoid moving your furniture around.  If any of your floors are uneven, fix them.  If there are any pets around you, be aware of where they are.  Review your medicines with your doctor. Some medicines can make you feel dizzy. This can increase your chance of falling. Ask your doctor what other things that you can do to help prevent falls. This information is not intended to replace advice given to you by your health care provider. Make sure you discuss any questions you have with your health care provider. Document Released: 03/05/2009 Document Revised: 10/15/2015 Document Reviewed: 06/13/2014 Elsevier Interactive Patient Education  2017 [employer].

## 2020-01-29 NOTE — Progress Notes (Signed)
This visit occurred during the SARS-CoV-2 public health emergency.  Safety protocols were in place, including screening questions prior to the visit, additional usage of staff PPE, and extensive cleaning of exam room while observing appropriate contact time as indicated for disinfecting solutions.  Subjective:   OTIE HEADLEE is a 74 y.o. female who presents for Medicare Annual (Subsequent) preventive examination.  Review of Systems     Cardiac Risk Factors include: advanced age (>72mn, >>88women);diabetes mellitus;hypertension;obesity (BMI >30kg/m2);sedentary lifestyle     Objective:    Today's Vitals   01/29/20 1129  BP: 136/70  Pulse: 83  Temp: 98.1 F (36.7 C)  TempSrc: Oral  SpO2: 98%  Weight: 158 lb 9.6 oz (71.9 kg)  Height: 4' 10.4" (1.483 m)   Body mass index is 32.69 kg/m.  Advanced Directives 01/29/2020 12/20/2018  Does Patient Have a Medical Advance Directive? No No  Would patient like information on creating a medical advance directive? Yes (MAU/Ambulatory/Procedural Areas - Information given) No - Patient declined    Current Medications (verified) Outpatient Encounter Medications as of 01/29/2020  Medication Sig  . aspirin EC 81 MG tablet Take 81 mg by mouth daily.  .Marland Kitchenb complex vitamins tablet Take 1 tablet by mouth daily.  . Calcium Carbonate (CALCIUM 600 PO) Take 1 tablet by mouth daily.   . Cholecalciferol (VITAMIN D3) 125 MCG (5000 UT) CAPS Take 1 capsule by mouth daily.   . Coenzyme Q10 100 MG TABS Take 1 tablet by mouth daily.  .Elmore GuiseDevices (ONE TOUCH DELICA LANCING DEV) MISC USE AS DIRECTED TO CHECK BLOOD SUGARS ONCE DAILY dx: e11.22  . Lancets (ONETOUCH DELICA PLUS LIEPPIR51O MISC USE AS DIRECTED TO CHECK BLOOD SUGARS ONCE DAILY  . magnesium gluconate (MAGONATE) 500 MG tablet Take 500 mg by mouth daily.  . Multiple Vitamin (MULTIVITAMIN) tablet Take 1 tablet by mouth daily.  .Marland Kitchenolmesartan (BENICAR) 20 MG tablet TAKE 1 TABLET BY MOUTH  DAILY  .  ONETOUCH VERIO test strip USE AS DIRECTED TO CHECK BLOOD SUGARS ONCE D  . OZEMPIC, 0.25 OR 0.5 MG/DOSE, 2 MG/1.5ML SOPN INJECT SUBCUTANEOUSLY 0.5MG ONCE A WEEK ON THURSDAY  . pantoprazole (PROTONIX) 40 MG tablet TAKE 1 TABLET(40 MG) BY MOUTH DAILY  . pioglitazone-metformin (ACTOPLUS MET) 15-500 MG tablet Take 1 tablet by mouth 2 (two) times daily.  . Pitavastatin Calcium 2 MG TABS Take 1 tablet by mouth daily. Monday, Wednesday, and Friday   No facility-administered encounter medications on file as of 01/29/2020.    Allergies (verified) Patient has no known allergies.   History: Past Medical History:  Diagnosis Date  . CKD stage 3 due to type 2 diabetes mellitus ([facility]   . DM (diabetes mellitus) ([facility]   . High cholesterol   . HTN (hypertension)    Past Surgical History:  Procedure Laterality Date  . CATARACT EXTRACTION, BILATERAL  01/2018   first was 12/2017  . corn removal Bilateral 06/2017   Family History  Problem Relation Age of Onset  . Asthma Mother   . Hypertension Mother   . Alzheimer's disease Mother   . Early death Father    Social History   Socioeconomic History  . Marital status: Widowed    Spouse name: Not on file  . Number of children: Not on file  . Years of education: Not on file  . Highest education level: Not on file  Occupational History  . Not on file  Tobacco Use  . Smoking status: Never Smoker  .  Smokeless tobacco: Never Used  Vaping Use  . Vaping Use: Never used  Substance and Sexual Activity  . Alcohol use: Never  . Drug use: Never  . Sexual activity: Yes  Other Topics Concern  . Not on file  Social History Narrative  . Not on file   Social Determinants of Health   Financial Resource Strain: Low Risk   . Difficulty of Paying Living Expenses: Not hard at all  Food Insecurity: No Food Insecurity  . Worried About Charity fundraiser in the Last Year: Never true  . Ran Out of Food in the Last Year: Never true  Transportation Needs: No  Transportation Needs  . Lack of Transportation (Medical): No  . Lack of Transportation (Non-Medical): No  Physical Activity: Inactive  . Days of Exercise per Week: 0 days  . Minutes of Exercise per Session: 0 min  Stress: No Stress Concern Present  . Feeling of Stress : Not at all  Social Connections:   . Frequency of Communication with Friends and Family: Not on file  . Frequency of Social Gatherings with Friends and Family: Not on file  . Attends Religious Services: Not on file  . Active Member of Clubs or Organizations: Not on file  . Attends Archivist Meetings: Not on file  . Marital Status: Not on file    Tobacco Counseling Counseling given: Not Answered   Clinical Intake:  Pre-visit preparation completed: Yes  Pain : No/denies pain     Nutritional Status: BMI > 30  Obese Nutritional Risks: None Diabetes: Yes  How often do you need to have someone help you when you read instructions, pamphlets, or other written materials from your doctor or pharmacy?: 1 - Never What is the last grade level you completed in school?: 12th grade  Diabetic? Yes Nutrition Risk Assessment:  Has the patient had any N/V/D within the last 2 months?  No  Does the patient have any non-healing wounds?  No  Has the patient had any unintentional weight loss or weight gain?  No   Diabetes:  Is the patient diabetic?  Yes  If diabetic, was a CBG obtained today?  No  Did the patient bring in their glucometer from home?  No  How often do you monitor your CBG's? daily.   Financial Strains and Diabetes Management:  Are you having any financial strains with the device, your supplies or your medication? No .  Does the patient want to be seen by Chronic Care Management for management of their diabetes?  No  Would the patient like to be referred to a Nutritionist or for Diabetic Management?  No   Diabetic Exams:  Diabetic Eye Exam: Completed 08/08/2019 Diabetic Foot Exam: Completed  today   Interpreter Needed?: No  Information entered by :: NAllen LPN   Activities of Daily Living In your present state of health, do you have any difficulty performing the following activities: 01/29/2020  Hearing? N  Vision? N  Difficulty concentrating or making decisions? N  Walking or climbing stairs? N  Dressing or bathing? N  Doing errands, shopping? N  Preparing Food and eating ? N  Using the Toilet? N  In the past six months, have you accidently leaked urine? N  Do you have problems with loss of bowel control? N  Managing your Medications? N  Managing your Finances? N  Housekeeping or managing your Housekeeping? N  Some recent data might be hidden    Patient Care Team: [facility],  Bailey Mech, MD as PCP - General (Internal Medicine) Daneen Schick as Social Worker [place], Claudette Stapler, RN as Case Manager [place], Kennieth Francois, [facility] (Pharmacist)  Indicate any recent Medical Services you may have received from other than [facility] providers in the past year (date may be approximate).     Assessment:   This is a routine wellness examination for Caitlyne.  Hearing/Vision screen  Hearing Screening   _0  _1  _2  _3  _4  _5  _6  _7  _8   Right ear:           Left ear:           Vision Screening Comments: Regular eye exams, Dr. Barrie Dunker  Dietary issues and exercise activities discussed: Current Exercise Habits: The patient does not participate in regular exercise at present  Goals    . Exercise 150 min/wk Moderate Activity     12/20/2018, wants to get back on exercise regimen    . Patient Stated     01/29/2020, start exercising again    . Pharmacy Care Plan     CARE PLAN ENTRY  Current Barriers:  . Chronic Disease Management support, education, and care coordination needs related to Hypertension, Hyperlipidemia, Diabetes, and Osteopenia   Hypertension . Pharmacist Clinical Goal(s): o Over the next 90 days, patient will work with PharmD and providers to  maintain BP goal <130/80 . Current regimen:  o Olmesartan 75m daily . Interventions: o Increase exercise regimen to 30 minutes 5 times weekly o Maintain heart healthy, balanced diet o Check blood pressure if symptomatic . Patient self care activities - Over the next 90 days, patient will: o Check BP if symptomatic, document, and provide at future appointments o Ensure daily salt intake < 2300 mg/day o Try to exercise for 30 minutes daily 5 times per week (150 minutes per week total)  Hyperlipidemia . Pharmacist Clinical Goal(s): o Over the next 90 days, patient will work with PharmD and providers to maintain LDL goal < 70 . Current regimen:  o Livalo 261mthree times weekly (Mon/Wed/Fri) . Interventions: o Provided pt with samples of Livalo 63m67mn 8/4 (3 months supply) . Patient self care activities - Over the next 90 days, patient will: o Continue to limit fried foods o Take medications daily as directed o Try to exercise for 30 minutes daily 5 times per week (150 minutes per week total)  Diabetes . Pharmacist Clinical Goal(s): o Over the next 90 days, patient will work with PharmD and providers to maintain A1c goal <7% . Current regimen:  o Ozempic 0.5mg363mekly (Fridays) o Pioglitazone/Metformin 15/500mg15mly . Interventions: o Increase exercise to 30 minutes 5 times weekly o Encouraged continuation of diabetic-friendly, balanced diet o Sent in refill request form for patient's Ozempic on 7/26 o Coordinated with PCP staff a [facility] stating [facility] was [facility] on 8/2 . Patient self care activities - Over the next 90 days, patient will: o Check blood sugar once daily, document, and provide at future appointments o Contact provider with any episodes of hypoglycemia o Try to exercise for 30 minutes daily 5 times per week (150 minutes per week total)  Osteopenia  . Pharmacist Clinical Goal(s): o Over  the next 90 days, patient will work with PharmD and providers to build and maintain bone density . Current regimen:  . Cholecalciferol 5000 units daily . Calcium carbonate 600mg 51my . Interventions: o Recommend patient increase calcium supplement to twice daily for a  total of 1290m daily o Discussed recommended calcium intake for osteopenia  o Will provide patient with resource regarding calcium content of common foods . Patient self care activities - Over the next 90 days, patient will: o Begin taking calcium 607mtwice daily (if she determines she does not get additional 60066mf calcium from diet daily)   Medication management . Pharmacist Clinical Goal(s): o Over the next 90 days, patient will work with PharmD and providers to maintain optimal medication adherence . [facility] o Comprehensive medication review performed. o Continue current medication management strategy . Patient self care activities - Over the next 90 days, patient will: o Focus on medication adherence by utilizing weekly pill box to organize medications o Take medications as prescribed o Report any questions or concerns to PharmD and/or provider(s)  Please see past updates related to this goal by clicking on the "Past Updates" button in the selected goal        Depression Screen PHQ 2/9 Scores 01/29/2020 12/20/2018 08/27/2018 05/24/2018 12/21/2017  PHQ - 2 Score 0 0 0 0 0  PHQ- 9 Score 0 0 - - -    Fall Risk Fall Risk  01/29/2020 12/20/2018 12/20/2018 08/27/2018 05/24/2018  Falls in the past year? 0 0 Exclusion - non ambulatory 0 0  Risk for fall due to : Medication side effect - Medication side effect - -  Follow up Falls evaluation completed;Education provided;Falls prevention discussed - Education provided;Falls evaluation completed;Falls prevention discussed - -    Any stairs in or around the home? No  If so, are there any without handrails?  n/a Home free of loose throw rugs in walkways, pet beds, electrical cords, etc? Yes  Adequate lighting in your home to reduce risk of falls? Yes   ASSISTIVE DEVICES UTILIZED TO PREVENT FALLS:  Life alert? No  Use of a cane, walker or w/c? No  Grab bars in the bathroom? No  Shower chair or bench in shower? No  Elevated toilet seat or a handicapped toilet? No   TIMED UP AND GO:  Was the test performed? No .   Gait steady and fast without use of assistive device  Cognitive Function:     6CIT Screen 01/29/2020 12/20/2018  What Year? 0 points 0 points  What month? 0 points 0 points  What time? 0 points 0 points  Count back from 20 0 points 0 points  Months in reverse 0 points 0 points  Repeat phrase 0 points 0 points  Total Score 0 0    Immunizations Immunization History  Administered Date(s) Administered  . DTaP 06/17/2011  . Fluad Quad(high Dose 65+) 03/19/2019  . Influenza-Unspecified 02/06/2018  . PFIZER SARS-COV-2 Vaccination 07/08/2019, 07/30/2019  . Pneumococcal Conjugate-13 05/07/2014  . Pneumococcal Polysaccharide-23 02/28/2013  . Pneumococcal-Unspecified 02/25/2013, 05/07/2015  . Zoster 04/30/2013    TDAP status: Up to date Flu Vaccine status: Completed at today's visit Pneumococcal vaccine status: Up to date Covid-19 vaccine status: Completed vaccines  Qualifies for Shingles Vaccine? Yes   Zostavax completed Yes   Shingrix Completed?: No.    Education has been provided regarding the importance of this vaccine. Patient has been advised to call insurance company to determine out of pocket expense if they have not yet received this vaccine. Advised may also receive vaccine at local pharmacy or Health Dept. Verbalized acceptance and understanding.  Screening Tests Health Maintenance  Topic Date Due  . FOOT EXAM  12/20/2019  .  INFLUENZA VACCINE  12/22/2019  . HEMOGLOBIN A1C  05/06/2020  . OPHTHALMOLOGY EXAM  08/07/2020  . MAMMOGRAM  01/16/2021  .  TETANUS/TDAP  06/16/2021  . COLONOSCOPY  12/17/2025  . DEXA SCAN  Completed  . COVID-19 Vaccine  Completed  . Hepatitis C Screening  Completed  . PNA vac Low Risk Adult  Completed    Health Maintenance  Health Maintenance Due  Topic Date Due  . FOOT EXAM  12/20/2019  . INFLUENZA VACCINE  12/22/2019    Colorectal cancer screening: Completed 12/18/2015. Repeat every 10 years Mammogram status: Patient to schedule Bone Density status: Completed 04/18/2018.   Lung Cancer Screening: (Low Dose CT Chest recommended if Age 27-80 years, 30 pack-year currently smoking OR have quit w/in 15years.) does not qualify.   Lung Cancer Screening Referral: no  Additional Screening:  Hepatitis C Screening: does qualify; Completed 06/19/2012  Vision Screening: Recommended annual ophthalmology exams for early detection of glaucoma and other disorders of the eye. Is the patient up to date with their annual eye exam?  Yes  Who is the provider or what is the name of the office in which the patient attends annual eye exams? Dr. Barrie Dunker If pt is not established with a provider, would they like to be referred to a provider to establish care? No .   Dental Screening: Recommended annual dental exams for proper oral hygiene  Community Resource Referral / Chronic Care Management: CRR required this visit?  No   CCM required this visit?  No      Plan:     I have personally reviewed and noted the following in the patient's chart:   . Medical and social history . Use of alcohol, tobacco or illicit drugs  . Current medications and supplements . Functional ability and status . Nutritional status . Physical activity . Advanced directives . List of other physicians . Hospitalizations, surgeries, and ER visits in previous 12 months . Vitals . Screenings to include cognitive, depression, and falls . Referrals and appointments  In addition, I have reviewed and discussed with patient certain preventive  protocols, quality metrics, and best practice recommendations. A written personalized care plan for preventive services as well as general preventive health recommendations were provided to patient.     Kellie Simmering, LPN   01/26/7472   Nurse Notes:

## 2020-01-30 LAB — CMP14+EGFR
ALT: 15 IU/L (ref 0–32)
AST: 22 IU/L (ref 0–40)
Albumin/Globulin Ratio: 1.9 (ref 1.2–2.2)
Albumin: 4.6 g/dL (ref 3.7–4.7)
Alkaline Phosphatase: 98 IU/L (ref 48–121)
BUN/Creatinine Ratio: 21 (ref 12–28)
BUN: 23 mg/dL (ref 8–27)
Bilirubin Total: 0.5 mg/dL (ref 0.0–1.2)
CO2: 26 mmol/L (ref 20–29)
Calcium: 9.5 mg/dL (ref 8.7–10.3)
Chloride: 103 mmol/L (ref 96–106)
Creatinine, Ser: 1.09 mg/dL — ABNORMAL HIGH (ref 0.57–1.00)
GFR calc Af Amer: 59 mL/min/{1.73_m2} — ABNORMAL LOW (ref >59)
GFR calc non Af Amer: 51 mL/min/{1.73_m2} — ABNORMAL LOW (ref >59)
Globulin, Total: 2.4 g/dL (ref 1.5–4.5)
Glucose: 91 mg/dL (ref 65–99)
Potassium: 4.2 mmol/L (ref 3.5–5.2)
Sodium: 141 mmol/L (ref 134–144)
Total Protein: 7 g/dL (ref 6.0–8.5)

## 2020-01-30 LAB — CBC
Hematocrit: 34 % (ref 34.0–46.6)
Hemoglobin: 11.1 g/dL (ref 11.1–15.9)
MCH: 28.4 pg (ref 26.6–33.0)
MCHC: 32.6 g/dL (ref 31.5–35.7)
MCV: 87 fL (ref 79–97)
Platelets: 321 10*3/uL (ref 150–450)
RBC: 3.91 x10E6/uL (ref 3.77–5.28)
RDW: 13.4 % (ref 11.7–15.4)
WBC: 5.7 10*3/uL (ref 3.4–10.8)

## 2020-01-30 LAB — VITAMIN D 25 HYDROXY (VIT D DEFICIENCY, FRACTURES): Vit D, 25-Hydroxy: 65.4 ng/mL (ref 30.0–100.0)

## 2020-01-30 LAB — HEMOGLOBIN A1C
Est. average glucose Bld gHb Est-mCnc: 131 mg/dL
Hgb A1c MFr Bld: 6.2 % — ABNORMAL HIGH (ref 4.8–5.6)

## 2020-02-04 ENCOUNTER — Other Ambulatory Visit: Payer: Self-pay | Admitting: Internal Medicine

## 2020-02-04 DIAGNOSIS — Z1231 Encounter for screening mammogram for malignant neoplasm of breast: Secondary | ICD-10-CM

## 2020-02-06 ENCOUNTER — Other Ambulatory Visit: Payer: Self-pay | Admitting: Internal Medicine

## 2020-02-07 ENCOUNTER — Other Ambulatory Visit: Payer: Self-pay

## 2020-02-07 ENCOUNTER — Ambulatory Visit
Admission: RE | Admit: 2020-02-07 | Discharge: 2020-02-07 | Disposition: A | Discharge status: Home / Self Care | Payer: Medicare Other | Source: Ambulatory Visit | Attending: Internal Medicine | Admitting: Internal Medicine

## 2020-02-07 DIAGNOSIS — Z1231 Encounter for screening mammogram for malignant neoplasm of breast: Secondary | ICD-10-CM | POA: Diagnosis not present

## 2020-02-25 ENCOUNTER — Telehealth: Payer: Self-pay

## 2020-02-25 NOTE — Progress Notes (Signed)
Chronic Care Management Pharmacy Assistant    Name: Nicole Nunez  MRN: [id number] DOB: 07/10/1945   Reason for Encounter: Disease State - Diabetes, Hypertension and Lipids Adherence Call.   Patient Questions:  1.  Have you seen any other providers since your last visit? No  2.  Any changes in your medicines or health? No      PCP : Glendale Chard, MD  Allergies:  No Known Allergies  Medications: Outpatient Encounter Medications as of 02/25/2020  Medication Sig  . aspirin EC 81 MG tablet Take 81 mg by mouth daily.  Marland Kitchen b complex vitamins tablet Take 1 tablet by mouth daily.  . Calcium Carbonate (CALCIUM 600 PO) Take 1 tablet by mouth daily.   . Cholecalciferol (VITAMIN D3) 125 MCG (5000 UT) CAPS Take 1 capsule by mouth daily.   . Coenzyme Q10 100 MG TABS Take 1 tablet by mouth daily.  Elmore Guise Devices (ONE TOUCH DELICA LANCING DEV) MISC USE AS DIRECTED TO CHECK BLOOD SUGARS ONCE DAILY dx: e11.22  . Lancets (ONETOUCH DELICA PLUS [id number]) MISC USE AS DIRECTED TO CHECK BLOOD SUGARS ONCE DAILY  . magnesium gluconate (MAGONATE) 500 MG tablet Take 500 mg by mouth daily.  . Multiple Vitamin (MULTIVITAMIN) tablet Take 1 tablet by mouth daily.  Marland Kitchen olmesartan (BENICAR) 20 MG tablet TAKE 1 TABLET BY MOUTH  DAILY  . ONETOUCH VERIO test strip USE AS DIRECTED TO CHECK  BLOOD SUGAR ONCE DAILY  . OZEMPIC, 0.25 OR 0.5 MG/DOSE, 2 MG/1.5ML SOPN INJECT SUBCUTANEOUSLY 0.5MG ONCE A WEEK ON THURSDAY  . pantoprazole (PROTONIX) 40 MG tablet TAKE 1 TABLET(40 MG) BY MOUTH DAILY  . pioglitazone-metformin (ACTOPLUS MET) 15-500 MG tablet Take 1 tablet by mouth 2 (two) times daily.  . Pitavastatin Calcium 2 MG TABS Take 1 tablet by mouth daily. Monday, Wednesday, and Friday   No facility-administered encounter medications on file as of 02/25/2020.    Current Diagnosis: Patient Active Problem List   Diagnosis Date Noted  . Notalgia 01/29/2020  . Vitamin D deficiency disease 01/29/2020  . Class 1  obesity due to excess calories with serious comorbidity and body mass index (BMI) of 32.0 to 32.9 in adult 01/29/2020  . Type 2 diabetes mellitus with stage 3 chronic kidney disease, without long-term current use of insulin ([place]) 05/24/2018  . Chronic renal disease, stage III ([place]) 05/24/2018  . Parenchymal renal hypertension 05/24/2018  . Pure hypercholesterolemia 05/24/2018  . Class 1 obesity due to excess calories with serious comorbidity and body mass index (BMI) of 31.0 to 31.9 in adult 05/24/2018    Recent Relevant Labs: Lab Results  Component Value Date/Time   HGBA1C 6.2 (H) 01/29/2020 02:42 PM   HGBA1C 6.2 (H) 11/05/2019 10:51 AM   MICROALBUR 10 01/29/2020 12:51 PM   MICROALBUR 30 12/20/2018 12:33 PM    Kidney Function Lab Results  Component Value Date/Time   CREATININE 1.09 (H) 01/29/2020 02:42 PM   CREATININE 1.21 (H) 11/05/2019 10:51 AM   GFRNONAA 51 (L) 01/29/2020 02:42 PM   GFRAA 59 (L) 01/29/2020 02:42 PM    . Current antihyperglycemic regimen:  o Ozempic 0.5 mg weekly o Pioglitazone/ Metformin 15/500 mg daily  . What recent interventions/DTPs have been made to improve glycemic control:  o Patient states she has been taking her medications as directed by provider.  . Have there been any recent hospitalizations or ED visits since last visit with CPP? No  . Patient denies hypoglycemic symptoms, including Pale, Sweaty, Shaky,  Hungry, Nervous/irritable and Vision changes . Patient denies hyperglycemic symptoms, including blurry vision, excessive thirst, fatigue, polyuria and weakness   . How often are you checking your blood sugar?  Patient states she takes her blood sugars daily.  . What are your blood sugars ranging? Patient states her fasting  blood sugars have been good, states she has only had one high fasting number and she drank a pepsi before bed.  o Fasting: 03/04/2020 - 117 o Fasting: 03/03/2020 - 109 o Fasting :03/05/2020 - 125 o Before meals:  None o After meals: None o Bedtime: None  . During the week, how often does your blood glucose drop below 70?  No  . Are you checking your feet daily/regularly? Yes. Patient denies any open sore, numbness or pain. Patient does see a Podiatrist.  Adherence Review: Is the patient currently on a STATIN medication? Yes. Pitavastatin 2 mg  Is the patient currently on ACE/ARB medication? Yes. Olmesartan Does the patient have >5 day gap between last estimated fill dates? No   Reviewed chart prior to disease state call. Spoke with patient regarding BP  Recent Office Vitals: BP Readings from Last 3 Encounters:  01/29/20 136/70  01/29/20 136/70  11/05/19 122/76   Pulse Readings from Last 3 Encounters:  01/29/20 83  01/29/20 83  11/05/19 86    Wt Readings from Last 3 Encounters:  01/29/20 158 lb 9.6 oz (71.9 kg)  01/29/20 158 lb 9.6 oz (71.9 kg)  11/05/19 156 lb 6.4 oz (70.9 kg)     Kidney Function Lab Results  Component Value Date/Time   CREATININE 1.09 (H) 01/29/2020 02:42 PM   CREATININE 1.21 (H) 11/05/2019 10:51 AM   GFRNONAA 51 (L) 01/29/2020 02:42 PM   GFRAA 59 (L) 01/29/2020 02:42 PM    BMP Latest Ref Rng & Units 01/29/2020 11/05/2019 07/30/2019  Glucose 65 - 99 mg/dL 91 95 96  BUN 8 - 27 mg/dL '23 17 15  ' Creatinine 0.57 - 1.00 mg/dL 1.09(H) 1.21(H) 1.21(H)  BUN/Creat Ratio 12 - '28 21 14 12  ' Sodium 134 - 144 mmol/L 141 140 142  Potassium 3.5 - 5.2 mmol/L 4.2 4.2 4.3  Chloride 96 - 106 mmol/L 103 103 105  CO2 20 - 29 mmol/L '26 24 23  ' Calcium 8.7 - 10.3 mg/dL 9.5 9.1 9.7    . Current antihypertensive regimen:  o Olmesartan 20 mg daily  . How often are you checking your Blood Pressure? Patient states she is not taking her blood pressure at home .  Marland Kitchen Current home BP readings: None  . What recent interventions/DTPs have been made by any provider to improve Blood Pressure control since last CPP Visit: Patient states she does take her medications as directed.  . Any  recent hospitalizations or ED visits since last visit with CPP? No.  . What diet changes have been made to improve Blood Pressure Control?  o Patient states she is eating healthy, she is eating vegetables no fried foods. Patient states she does not eat red meat.   . What exercise is being done to improve your Blood Pressure Control?  o Patient states she is walking 8000 -9000 steps 2 times a week, at [facility] . Patient states she is active the other days .  Adherence Review: Is the patient currently on ACE/ARB medication? Olmesartan 20 mg daily Does the patient have >5 day gap between last estimated fill dates? No   Comprehensive medication review performed; Spoke to patient regarding cholesterol  Lipid  Panel    Component Value Date/Time   CHOL 148 07/30/2019 1228   TRIG 58 07/30/2019 1228   HDL 66 07/30/2019 1228   LDLCALC 70 07/30/2019 1228    10-year ASCVD risk score: The 10-year ASCVD risk score Mikey Bussing DC Brooke Bonito., et al., 2013) is: 25.7%   Values used to calculate the score:     Age: 74 years     Sex: Female     Is Non-Hispanic African American: Yes     Diabetic: Yes     Tobacco smoker: No     Systolic Blood Pressure: 782 mmHg     Is BP treated: Yes     HDL Cholesterol: 66 mg/dL     Total Cholesterol: 148 mg/dL  . Current antihyperlipidemic regimen:  o Livalo 2 mg three times a week (Mon/ Wed/Fri)   . ASCVD risk enhancing conditions: age >61, DM, HTN, CKD, CHF, current smoker  . What recent interventions/DTPs have been made by any provider to improve Cholesterol control since last CPP Visit: Patient states she is taking her medications as directed by provider.   . Any recent hospitalizations or ED visits since last visit with CPP? No  . What diet changes have been made to improve Cholesterol?  o Patient states she does not eat fried foods / watching her Carbohydrates.  . What exercise is being done to improve Cholesterol?  o Patient is walking 8,000-9,000 steps two  days a week at park , states she is active the rest of week.  Adherence Review: Does the patient have >5 day gap between last estimated fill dates? No       Goals Addressed            This Visit's Progress   . Pharmacy Care Plan   On track    CARE PLAN ENTRY  Current Barriers:  . Chronic Disease Management support, education, and care coordination needs related to Hypertension, Hyperlipidemia, Diabetes, and Osteopenia   Hypertension . Pharmacist Clinical Goal(s): o Over the next 90 days, patient will work with PharmD and providers to maintain BP goal <130/80 . Current regimen:  o Olmesartan 24m daily . Interventions: o Increase exercise regimen to 30 minutes 5 times weekly o Maintain heart healthy, balanced diet o Check blood pressure if symptomatic . Patient self care activities - Over the next 90 days, patient will: o Check BP if symptomatic, document, and provide at future appointments o Ensure daily salt intake < 2300 mg/day o Try to exercise for 30 minutes daily 5 times per week (150 minutes per week total)  Hyperlipidemia . Pharmacist Clinical Goal(s): o Over the next 90 days, patient will work with PharmD and providers to maintain LDL goal < 70 . Current regimen:  o Livalo 276mthree times weekly (Mon/Wed/Fri) . Interventions: o Provided pt with samples of Livalo 40m47mn 8/4 (3 months supply) . Patient self care activities - Over the next 90 days, patient will: o Continue to limit fried foods o Take medications daily as directed o Try to exercise for 30 minutes daily 5 times per week (150 minutes per week total)  Diabetes . Pharmacist Clinical Goal(s): o Over the next 90 days, patient will work with PharmD and providers to maintain A1c goal <7% . Current regimen:  o Ozempic 0.5mg8mekly (Fridays) o Pioglitazone/Metformin 15/500mg66mly . Interventions: o Increase exercise to 30 minutes 5 times weekly o Encouraged continuation of diabetic-friendly,  balanced diet o Sent in refill request form for patient's Ozempic  on 7/26 o Coordinated with PCP staff a new prescription for OneTouch lancing device sent in to [facility] after patient called stating hers was broken on 8/2 . Patient self care activities - Over the next 90 days, patient will: o Check blood sugar once daily, document, and provide at future appointments o Contact provider with any episodes of hypoglycemia o Try to exercise for 30 minutes daily 5 times per week (150 minutes per week total)  Osteopenia  . Pharmacist Clinical Goal(s): o Over the next 90 days, patient will work with PharmD and providers to build and maintain bone density . Current regimen:  . Cholecalciferol 5000 units daily . Calcium carbonate 681m daily . Interventions: o Recommend patient increase calcium supplement to twice daily for a total of 12058mdaily o Discussed recommended calcium intake for osteopenia  o Will provide patient with resource regarding calcium content of common foods . Patient self care activities - Over the next 90 days, patient will: o Begin taking calcium 60023mwice daily (if she determines she does not get additional 600m51m calcium from diet daily)   Medication management . Pharmacist Clinical Goal(s): o Over the next 90 days, patient will work with PharmD and providers to maintain optimal medication adherence . [facility] o Comprehensive medication review performed. o Continue current medication management strategy . Patient self care activities - Over the next 90 days, patient will: o Focus on medication adherence by utilizing weekly pill box to organize medications o Take medications as prescribed o Report any questions or concerns to PharmD and/or provider(s)  Please see past updates related to this goal by clicking on the "Past Updates" button in the selected goal         Follow-Up:  Pharmacist  Review - Patient wanted to know about getting assistance to help pay for her to get a shingle shot, states it will cost her over $100.00 with her insurance. Patient also wanted to know about her Covid booster. I told her she would have to call Dr. [facility] about there Covid booster.  ChriBeverly MilchP Notified.  VeroJudithann SheenMAMckenzie-Willamette Medical Centernical Pharmacist Assistant [phone]

## 2020-03-06 ENCOUNTER — Telehealth: Payer: Self-pay | Admitting: Pharmacist

## 2020-03-06 NOTE — Chronic Care Management (AMB) (Signed)
    Chronic Care Management Pharmacy Assistant    Name: Nicole Nunez  MRN: [id number] DOB: 30-Nov-1945    Reason for Encounter: Medication Review - Patient Assistance Coordination.  PCP : Glendale Chard, MD  Allergies:  No Known Allergies  Medications: Outpatient Encounter Medications as of 03/06/2020  Medication Sig  . aspirin EC 81 MG tablet Take 81 mg by mouth daily.  Marland Kitchen b complex vitamins tablet Take 1 tablet by mouth daily.  . Calcium Carbonate (CALCIUM 600 PO) Take 1 tablet by mouth daily.   . Cholecalciferol (VITAMIN D3) 125 MCG (5000 UT) CAPS Take 1 capsule by mouth daily.   . Coenzyme Q10 100 MG TABS Take 1 tablet by mouth daily.  Elmore Guise Devices (ONE TOUCH DELICA LANCING DEV) MISC USE AS DIRECTED TO CHECK BLOOD SUGARS ONCE DAILY dx: e11.22  . Lancets (ONETOUCH DELICA PLUS [id number]) MISC USE AS DIRECTED TO CHECK BLOOD SUGARS ONCE DAILY  . magnesium gluconate (MAGONATE) 500 MG tablet Take 500 mg by mouth daily.  . Multiple Vitamin (MULTIVITAMIN) tablet Take 1 tablet by mouth daily.  Marland Kitchen olmesartan (BENICAR) 20 MG tablet TAKE 1 TABLET BY MOUTH  DAILY  . ONETOUCH VERIO test strip USE AS DIRECTED TO CHECK  BLOOD SUGAR ONCE DAILY  . OZEMPIC, 0.25 OR 0.5 MG/DOSE, 2 MG/1.5ML SOPN INJECT SUBCUTANEOUSLY 0.5MG  ONCE A WEEK ON THURSDAY  . pantoprazole (PROTONIX) 40 MG tablet TAKE 1 TABLET(40 MG) BY MOUTH DAILY  . pioglitazone-metformin (ACTOPLUS MET) 15-500 MG tablet Take 1 tablet by mouth 2 (two) times daily.  . Pitavastatin Calcium 2 MG TABS Take 1 tablet by mouth daily. Monday, Wednesday, and Friday   No facility-administered encounter medications on file as of 03/06/2020.    Current Diagnosis: Patient Active Problem List   Diagnosis Date Noted  . Notalgia 01/29/2020  . Vitamin D deficiency disease 01/29/2020  . Class 1 obesity due to excess calories with serious comorbidity and body mass index (BMI) of 32.0 to 32.9 in adult 01/29/2020  . Type 2 diabetes mellitus with  stage 3 chronic kidney disease, without long-term current use of insulin ([place]) 05/24/2018  . Chronic renal disease, stage III ([place]) 05/24/2018  . Parenchymal renal hypertension 05/24/2018  . Pure hypercholesterolemia 05/24/2018  . Class 1 obesity due to excess calories with serious comorbidity and body mass index (BMI) of 31.0 to 31.9 in adult 05/24/2018      Follow-Up:  Patient [place], spoke with Tiffany about patient assistance for shingrix vaccine Tiffany states that they no longer offer individuals , they only work with physicians office and do 10 doses at a time since 09/2019.   New application form filled out to [employer] vaccine assistance program . Awaiting for provider and patient's  signature and also for patient to bring proof of income so form can be faxed.  Beverly Milch, CPP . Notified   Judithann Sheen, [place] Pharmacist Assistant [phone]

## 2020-03-20 ENCOUNTER — Telehealth: Payer: Self-pay | Admitting: Pharmacist

## 2020-03-20 NOTE — Chronic Care Management (AMB) (Signed)
    Chronic Care Management Pharmacy Assistant   Name: Nicole Nunez  MRN: [id number] DOB: 1945/11/08  Reason for Encounter: Medication Review- Medication Adherence.  PCP : Glendale Chard, MD  Allergies:  No Known Allergies  Medications: Outpatient Encounter Medications as of 03/20/2020  Medication Sig  . aspirin EC 81 MG tablet Take 81 mg by mouth daily.  Marland Kitchen b complex vitamins tablet Take 1 tablet by mouth daily.  . Calcium Carbonate (CALCIUM 600 PO) Take 1 tablet by mouth daily.   . Cholecalciferol (VITAMIN D3) 125 MCG (5000 UT) CAPS Take 1 capsule by mouth daily.   . Coenzyme Q10 100 MG TABS Take 1 tablet by mouth daily.  Elmore Guise Devices (ONE TOUCH DELICA LANCING DEV) MISC USE AS DIRECTED TO CHECK BLOOD SUGARS ONCE DAILY dx: e11.22  . Lancets (ONETOUCH DELICA PLUS [id number]) MISC USE AS DIRECTED TO CHECK BLOOD SUGARS ONCE DAILY  . magnesium gluconate (MAGONATE) 500 MG tablet Take 500 mg by mouth daily.  . Multiple Vitamin (MULTIVITAMIN) tablet Take 1 tablet by mouth daily.  Marland Kitchen olmesartan (BENICAR) 20 MG tablet TAKE 1 TABLET BY MOUTH  DAILY  . ONETOUCH VERIO test strip USE AS DIRECTED TO CHECK  BLOOD SUGAR ONCE DAILY  . OZEMPIC, 0.25 OR 0.5 MG/DOSE, 2 MG/1.5ML SOPN INJECT SUBCUTANEOUSLY 0.5MG  ONCE A WEEK ON THURSDAY  . pantoprazole (PROTONIX) 40 MG tablet TAKE 1 TABLET(40 MG) BY MOUTH DAILY  . pioglitazone-metformin (ACTOPLUS MET) 15-500 MG tablet Take 1 tablet by mouth 2 (two) times daily.  . Pitavastatin Calcium 2 MG TABS Take 1 tablet by mouth daily. Monday, Wednesday, and Friday   No facility-administered encounter medications on file as of 03/20/2020.    Current Diagnosis: Patient Active Problem List   Diagnosis Date Noted  . Notalgia 01/29/2020  . Vitamin D deficiency disease 01/29/2020  . Class 1 obesity due to excess calories with serious comorbidity and body mass index (BMI) of 32.0 to 32.9 in adult 01/29/2020  . Type 2 diabetes mellitus with stage 3 chronic  kidney disease, without long-term current use of insulin ([place]) 05/24/2018  . Chronic renal disease, stage III ([place]) 05/24/2018  . Parenchymal renal hypertension 05/24/2018  . Pure hypercholesterolemia 05/24/2018  . Class 1 obesity due to excess calories with serious comorbidity and body mass index (BMI) of 31.0 to 31.9 in adult 05/24/2018     Follow-Up:  Pharmacist Review -  Reviewed chat and adherence measures. Per insurance data, Medication adherence for diabetes 100% med compliance. Medication for hypertension is 100% med compliance.  Beverly Milch, CCP. Notified   Judithann Sheen, [place] Pharmacist Assistant [phone]

## 2020-04-06 ENCOUNTER — Telehealth: Payer: Self-pay | Admitting: *Deleted

## 2020-04-06 NOTE — Chronic Care Management (AMB) (Signed)
  Care Management   Note  04/06/2020 Name: Nicole Nunez MRN: [id number] DOB: [birth year]  Nicole Nunez is a 74 y.o. year old female who is a primary care patient of Glendale Chard, MD and is actively engaged with the care management team. I reached out to [name] by phone today to assist with re-scheduling a follow up visit with the Pharmacist.  Follow up plan: Telephone appointment with care management team member scheduled for:05/05/2020  [place], [place] Management  Direct Dial: [phone]

## 2020-04-06 NOTE — Chronic Care Management (AMB) (Signed)
  Care Management   Note  04/06/2020 Name: Nicole Nunez MRN: [id number] DOB: 1945/12/12  Nicole Nunez is a 74 y.o. year old female who is a primary care patient of Glendale Chard, MD and is actively engaged with the care management team. I reached out to [name] by phone today to assist with re-scheduling a follow up visit with the Pharmacist.  Follow up plan: Unsuccessful telephone outreach attempt made. A HIPAA compliant phone message was left for the patient providing contact information and requesting a return call. The care management team will reach out to the patient again over the next 7 days. If patient returns call to provider office, please advise to call [place] at [phone].  [place] Management  Direct Dial: [phone]

## 2020-04-20 ENCOUNTER — Telehealth: Payer: Self-pay

## 2020-04-21 ENCOUNTER — Telehealth: Payer: Self-pay

## 2020-04-21 DIAGNOSIS — L84 Corns and callosities: Secondary | ICD-10-CM | POA: Diagnosis not present

## 2020-04-21 DIAGNOSIS — E118 Type 2 diabetes mellitus with unspecified complications: Secondary | ICD-10-CM | POA: Diagnosis not present

## 2020-04-21 DIAGNOSIS — B351 Tinea unguium: Secondary | ICD-10-CM | POA: Diagnosis not present

## 2020-04-21 NOTE — Chronic Care Management (AMB) (Signed)
° ° °  Chronic Care Management Pharmacy Assistant   Name: Nicole Nunez  MRN: [id number] DOB: 05/02/1946  Reason for Encounter: Medication Review  PCP : Glendale Chard, MD  Allergies:  No Known Allergies  Medications: Outpatient Encounter Medications as of 04/21/2020  Medication Sig   aspirin EC 81 MG tablet Take 81 mg by mouth daily.   b complex vitamins tablet Take 1 tablet by mouth daily.   Calcium Carbonate (CALCIUM 600 PO) Take 1 tablet by mouth daily.    Cholecalciferol (VITAMIN D3) 125 MCG (5000 UT) CAPS Take 1 capsule by mouth daily.    Coenzyme Q10 100 MG TABS Take 1 tablet by mouth daily.   Lancet Devices (ONE TOUCH DELICA LANCING DEV) MISC USE AS DIRECTED TO CHECK BLOOD SUGARS ONCE DAILY dx: e11.22   Lancets (ONETOUCH DELICA PLUS [id number]) MISC USE AS DIRECTED TO CHECK BLOOD SUGARS ONCE DAILY   magnesium gluconate (MAGONATE) 500 MG tablet Take 500 mg by mouth daily.   Multiple Vitamin (MULTIVITAMIN) tablet Take 1 tablet by mouth daily.   olmesartan (BENICAR) 20 MG tablet TAKE 1 TABLET BY MOUTH  DAILY   ONETOUCH VERIO test strip USE AS DIRECTED TO CHECK  BLOOD SUGAR ONCE DAILY   OZEMPIC, 0.25 OR 0.5 MG/DOSE, 2 MG/1.5ML SOPN INJECT SUBCUTANEOUSLY 0.5MG ONCE A WEEK ON THURSDAY   pantoprazole (PROTONIX) 40 MG tablet TAKE 1 TABLET(40 MG) BY MOUTH DAILY   pioglitazone-metformin (ACTOPLUS MET) 15-500 MG tablet Take 1 tablet by mouth 2 (two) times daily.   Pitavastatin Calcium 2 MG TABS Take 1 tablet by mouth daily. Monday, Wednesday, and Friday   No facility-administered encounter medications on file as of 04/21/2020.    Current Diagnosis: Patient Active Problem List   Diagnosis Date Noted   Notalgia 01/29/2020   Vitamin D deficiency disease 01/29/2020   Class 1 obesity due to excess calories with serious comorbidity and body mass index (BMI) of 32.0 to 32.9 in adult 01/29/2020   Type 2 diabetes mellitus with stage 3 chronic kidney disease, without  long-term current use of insulin ([place]) 05/24/2018   Chronic renal disease, stage III ([place]) 05/24/2018   Parenchymal renal hypertension 05/24/2018   Pure hypercholesterolemia 05/24/2018   Class 1 obesity due to excess calories with serious comorbidity and body mass index (BMI) of 31.0 to 31.9 in adult 05/24/2018      Follow-Up:  Pharmacist Review Reviewed chart and adherence measures . Per [employer], Medication adherence for diabetes 100% med compliance , medication adherence for hypertension 100% med compliance .   Orlando Penner, CPP. Notified  Nicole Nunez, [place] Pharmacist Assistant [phone]

## 2020-05-05 ENCOUNTER — Ambulatory Visit: Payer: Self-pay

## 2020-05-05 DIAGNOSIS — E7800 Pure hypercholesterolemia, unspecified: Secondary | ICD-10-CM

## 2020-05-05 DIAGNOSIS — E1165 Type 2 diabetes mellitus with hyperglycemia: Secondary | ICD-10-CM

## 2020-05-05 NOTE — Chronic Care Management (AMB) (Signed)
Chronic Care Management Pharmacy  Name: Nicole Nunez  MRN: [id number] DOB: Aug 22, 1945  Chief Complaint/ HPI  Nicole Nunez,  74 y.o. , female presents for their Follow-Up CCM visit with the clinical pharmacist via telephone due to COVID-19 Pandemic. Nicole Nunez has 4 children and she has 2 grand children. One grand child is in [place], and her youngest grand daughter lives with her. Her daughter lost her child in 2008. She reports that he passed prior to his 28 st birthday. She is from [place], [place]. She has been in [place], [place] since she was 74 years old. Patients normal routine is taking her grand daughter to work at 6:30 AM. Then she goes home a crochets baby blankets and baby clothes. She works on that until 9 am, she then eats breakfast. She usually waters her plants and then sometimes she might go out with her sister. They go out to eat once a week and then they go shopping. Sometimes she will go out by herself. She was in exercise class, but she stopped going to the class. She likes to listen to gospel music and she does bible study by zoom and go to church service. If it is not too cold she walks at [employer] park in [place] because it gives her the hills to strengthen her legs. She usually does about 5000 steps a day, and she uses an app on her phone to monitor her steps. She also might walk around larger stores to get her steps in when it is too cold.  She normally does not have to pick up her grand daughter from work because her grand son will pick her up.She takes her medications in the morning when she eats her breakfast. Patient reports that she gets full night sleep. She is on [place] and she uses her computer for games. She sometimes goes to sleep around 9 PM and other nights she goes to sleep around 12.    PCP : Glendale Chard, MD  Their chronic conditions include: Hypertension, Diabetes with stage 3 CKD, Pure hypercholesterolemia  Office Visits:  11/05/19 OV:  DM/HTN check. BP well controlled. Kidney function is stale. HgbA1c is stable at 6.2%. Continue current medications. Given Rx for Shingrix to receive at local pharmacy.   07/30/19 OV: Presented for DM and HTN follow up. Labs ordered (CMP14+EGFR, HgbA1c, Lipid panel). Pt mentioned hearing issues. Screening audiometry performed and was not normal. Referred to Audiology for formal audiometry testing.  05/21/19 Telephone call: Notified pt that Dr. Baird Cancer wants to start Lovastatin 77m once weekly if pt agrees  04/24/19 OV: Presented for DM and HTN follow up. Pt reported flare of back pain over the weekend. Labs ordered (HgbA1c, BMP8+EGFR). Advised to take magnesium nightly. Encouraged OTC lidocaine patches as needed. Provided dietary and exercise recommendations.   Consult Visit: 08/08/19 Ophthalmology OV: Vision is stable. Mild nonproliferative diabetic retinopathy of right eye w/ macular edema and of left eye w/o macular edema. Pseudophakia of both eyes. Vitreomacular adhesion of right eye  CCM Encounters: 06/26/19 PharmD: Comprehensive medication review performed. Consider d/c pioglitazone. Assisting with Ozempic PAP. Approved through [employer] 05/22/20.   06/20/19: Ozempic patient assistance application completed and faxed to [employer] 05/13/19 PharmD: Comprehensive medication review performed. Pt meets income requirement for Ozempic PAP through [employer] A1c controlled, consider dropping pioglitazone from combo Actoplusmet.   Medications: Outpatient Encounter Medications as of 05/05/2020  Medication Sig  . aspirin EC 81 MG tablet Take 81 mg  by mouth daily.  Marland Kitchen b complex vitamins tablet Take 1 tablet by mouth daily.  . Calcium Carbonate (CALCIUM 600 PO) Take 1 tablet by mouth daily.   . Cholecalciferol (VITAMIN D3) 125 MCG (5000 UT) CAPS Take 1 capsule by mouth daily.   . Coenzyme Q10 100 MG TABS Take 1 tablet by mouth daily.  Elmore Guise Devices (ONE TOUCH DELICA LANCING DEV) MISC  USE AS DIRECTED TO CHECK BLOOD SUGARS ONCE DAILY dx: e11.22  . Lancets (ONETOUCH DELICA PLUS [id number]) MISC USE AS DIRECTED TO CHECK BLOOD SUGARS ONCE DAILY  . magnesium gluconate (MAGONATE) 500 MG tablet Take 500 mg by mouth daily.  . Multiple Vitamin (MULTIVITAMIN) tablet Take 1 tablet by mouth daily.  Marland Kitchen olmesartan (BENICAR) 20 MG tablet TAKE 1 TABLET BY MOUTH  DAILY  . ONETOUCH VERIO test strip USE AS DIRECTED TO CHECK  BLOOD SUGAR ONCE DAILY  . OZEMPIC, 0.25 OR 0.5 MG/DOSE, 2 MG/1.5ML SOPN INJECT SUBCUTANEOUSLY 0.5MG  ONCE A WEEK ON THURSDAY  . pantoprazole (PROTONIX) 40 MG tablet TAKE 1 TABLET(40 MG) BY MOUTH DAILY  . pioglitazone-metformin (ACTOPLUS MET) 15-500 MG tablet Take 1 tablet by mouth 2 (two) times daily.  . Pitavastatin Calcium 2 MG TABS Take 1 tablet by mouth daily. Monday, Wednesday, and Friday   No facility-administered encounter medications on file as of 05/05/2020.    Current Diagnosis/Assessment:   Goals Addressed   None     Diabetes   Goals <7   Recent Relevant Labs: Lab Results  Component Value Date/Time   HGBA1C 6.2 (H) 01/29/2020 02:42 PM   HGBA1C 6.2 (H) 11/05/2019 10:51 AM   MICROALBUR 10 01/29/2020 12:51 PM   MICROALBUR 30 12/20/2018 12:33 PM    Checking BG: Daily in the morning  Recent FBG Readings: 119 this morning Patient has failed these meds in past: N/A Patient is currently controlled on the following medications:   Ozempic 0.5mg  weekly on Friday   Pioglitazone/metformin 15/500mg  once daily  Last diabetic Foot exam: 12/20/18 Last diabetic Eye exam: 08/08/19  We discussed:  . Diet extensively o Pt is drinking a large cup of water in the morning prior to anything else o She eats breakfast about an hour after drinking her large cup of water  . Exercise extensively o Patient is walking  o Going to go the mall  . BG in the morning is usually below 130 . We discussed signs and symptoms of hypoglycemia  . BG has been higher than normal  recently due to pt eating at night o In October she had a reading of 140 and 149 in November - She reports this was due to a snack that she has sometimes with peanuts and pepsi.   Patient picked up [place] delivery from patient assistance program she still has one full pen left.   Will complete the paperwork for her patient assistance program renewal   Plan Continue current medications   Hypertension   Office blood pressures are  BP Readings from Last 3 Encounters:  01/29/20 136/70  01/29/20 136/70  11/05/19 122/76   Patient has failed these meds in the past: olmesartan.HCTZ Patient is currently controlled on the following medications:   Olmesartan 20mg  daily  Patient checks BP at home never. Does not have a machine at home to test  We discussed:  Denies headaches and dizziness  Pt said it has never been recommended that she check her BP at home  Recommend pt check if symptomatic  Plan Continue current medications  Hyperlipidemia   Lipid Panel     Component Value Date/Time   CHOL 148 07/30/2019 1228   TRIG 58 07/30/2019 1228   HDL 66 07/30/2019 1228   CHOLHDL 2.2 07/30/2019 1228   LDLCALC 70 07/30/2019 1228   LABVLDL 12 07/30/2019 1228     The 10-year ASCVD risk score Mikey Bussing DC Jr., et al., 2013) is: 25.7%   Values used to calculate the score:     Age: 42 years     Sex: Female     Is Non-Hispanic African American: Yes     Diabetic: Yes     Tobacco smoker: No     Systolic Blood Pressure: 497 mmHg     Is BP treated: Yes     HDL Cholesterol: 66 mg/dL     Total Cholesterol: 148 mg/dL   Patient has failed these meds in past: N/A Patient is currently controlled on the following medications:   Pitavastatin 61m daily (Mon/Wed/Fri)  Aspirin 860mdaily  Coenzyme Q10 10054maily  We discussed:   Pt to pick up samples of [place] 2 mg [place] week before she runs out in order to provide her with more  samples  Plan Continue current medications  Provide pt with Livalo samples as needed  GERD   Patient has failed these meds in past: N/A Patient is currently controlled on the following medications:   Pantoprazole 60m27mily  We discussed:    Avoid foods that may trigger symptoms  Plan Continue current medications   Osteopenia / Osteoporosis   Last DEXA Scan: 04/18/18  T-Score femoral neck: -1.8  T-Score total hip:   T-Score lumbar spine:   T-Score forearm radius: -0.5  10-year probability of major osteoporotic fracture: 4.7%  10-year probability of hip fracture: 0.8%  Vit D, 25-Hydroxy  Date Value Ref Range Status  01/29/2020 65.4 30.0 - 100.0 ng/mL Final    Comment:    Vitamin D deficiency has been defined by the [place] Endocrine Society practice guideline as a level of serum 25-OH vitamin D less than 20 ng/mL (1,2). The Endocrine Society went on to further define vitamin D insufficiency as a level between 21 and 29 ng/mL (2). 1. IOM (Institute of Medicine). 2010. Dietary reference    intakes for calcium and D. [place]    [employer] Holick MF, Binkley Hunt, Bischoff-Ferrari HA, et al.    Evaluation, treatment, and prevention of vitamin D    deficiency: an Endocrine Society clinical practice    guideline. JCEM. 2011 Jul; 96(7):1911-30.      Patient is not a candidate for pharmacologic treatment  Patient has failed these meds in past: N/A Patient is currently controlled on the following medications:  . Cholecalciferol 5000 units daily . Calcium carbonate 600mg21mly  We discussed:  Recommend 1200 mg of calcium daily from dietary and supplemental sources. Recommend weight-bearing and muscle strengthening exercises for building and maintaining bone density.  Pt says she is not getting much calcium from her die  Recommend she increase calcium supplement to twice daily  Will send calcium-content food  recommendations  Plan Continue current medications  Vaccines   Reviewed and discussed patient's vaccination history.    Immunization History  Administered Date(s) Administered  . DTaP 06/17/2011  . Fluad Quad(high Dose 65+) 03/19/2019, 01/29/2020  . Influenza-Unspecified 02/06/2018  . PFIZER SARS-COV-2 Vaccination 07/08/2019, 07/30/2019  . Pneumococcal Conjugate-13 05/07/2014  .  Pneumococcal Polysaccharide-23 02/28/2013  . Pneumococcal-Unspecified 02/25/2013, 05/07/2015  . Zoster 04/30/2013   We discussed:  Shingrix prescribed at office visit in June. Pt states she has not received yet  Advised pt that [facility] has her prescription for Shingrix and she will need to go ask them for it  Discussed Shingrix is a two dose series  Discussed possibility of getting a COVID booster  Plan Recommended patient receive Shingrix vaccine in pharmacy  Medication Management   Pt uses [facility] for all medications Uses pill box? Yes Pt endorses 100% compliance  Plan Continue current medication management strategy   Follow up: 3 month phone visit  Orlando Penner, PharmD Clinical Pharmacist [facility] [phone]

## 2020-05-05 NOTE — Patient Instructions (Signed)
Visit Information  Goals Addressed            This Visit's Progress    Pharmacy Care Plan       CARE PLAN ENTRY  Current Barriers:   Chronic Disease Management support, education, and care coordination needs related to Hypertension, Hyperlipidemia, Diabetes, and Osteopenia   Hypertension  Pharmacist Clinical Goal(s): o Over the next 90 days, patient will work with PharmD and providers to maintain BP goal <130/80  Current regimen:  o Olmesartan 20mg  daily  Interventions: o Increase exercise regimen to 30 minutes 5 times weekly o Maintain heart healthy, balanced diet o Check blood pressure if symptomatic  Patient self care activities - Over the next 90 days, patient will: o Check BP if symptomatic, document, and provide at future appointments o Ensure daily salt intake < 2300 mg/day o Try to exercise for 30 minutes daily 5 times per week (150 minutes per week total)  Hyperlipidemia  Pharmacist Clinical Goal(s): o Over the next 90 days, patient will work with PharmD and providers to maintain LDL goal < 70  Current regimen:  o Livalo 2mg  three times weekly (Mon/Wed/Fri)  Interventions: o Provided pt with samples of Livalo 2mg  on 05/05/2020 - 3 month supply  Patient self care activities - Over the next 90 days, patient will: o Continue to limit fried foods o Take medications daily as directed o Try to exercise for 30 minutes daily 5 times per week (150 minutes per week total)  Diabetes  Pharmacist Clinical Goal(s): o Over the next 90 days, patient will work with PharmD and providers to maintain A1c goal <7%  Current regimen:  o Ozempic 0.5mg  weekly (Fridays) o Pioglitazone/Metformin 15/500mg  daily  Interventions: o Increase exercise to 30 minutes 5 times weekly o Encouraged continuation of diabetic-friendly, balanced diet o Completion of PAP paperwork for patients Ozempic for yearly renewal 2022  Patient self care activities - Over the next 90 days, patient  will: o Check blood sugar once daily, document, and provide at future appointments o Contact provider with any episodes of hypoglycemia o Try to exercise for 30 minutes daily 5 times per week (150 minutes per week total)  Osteopenia   Pharmacist Clinical Goal(s): o Over the next 90 days, patient will work with PharmD and providers to build and maintain bone density  Current regimen:   Cholecalciferol 5000 units daily  Calcium carbonate 600mg  daily  Interventions: o Recommend patient increase calcium supplement to twice daily for a total of 1200mg  daily o Discussed recommended calcium intake for osteopenia  o Will provide patient with resource regarding calcium content of common foods  Patient self care activities - Over the next 90 days, patient will: o Begin taking calcium 600mg  twice daily (if she determines she does not get additional 600mg  of calcium from diet daily)   Medication management  Pharmacist Clinical Goal(s): o Over the next 90 days, patient will work with PharmD and providers to maintain optimal medication adherence  [facility] pharmacy: [facility]  Interventions o Comprehensive medication review performed. o Continue current medication management strategy  Patient self care activities - Over the next 90 days, patient will: o Focus on medication adherence by utilizing weekly pill box to organize medications o Take medications as prescribed o Report any questions or concerns to PharmD and/or provider(s)  Please see past updates related to this goal by clicking on the "Past Updates" button in the selected goal  Nicole Nunez was given information about Chronic Care Management services today including:  1. CCM service includes personalized support from designated clinical staff supervised by her physician, including individualized plan of care and coordination with other care providers 2. 24/7 contact phone numbers for  assistance for urgent and routine care needs. 3. Standard insurance, coinsurance, copays and deductibles apply for chronic care management only during months in which we provide at least 20 minutes of these services. Most insurances cover these services at 100%, however patients may be responsible for any copay, coinsurance and/or deductible if applicable. This service may help you avoid the need for more expensive face-to-face services. 4. Only one practitioner may furnish and bill the service in a calendar month. 5. The patient may stop CCM services at any time (effective at the end of the month) by phone call to the office staff.  Patient agreed to services and verbal consent obtained.   The patient verbalized understanding of instructions, educational materials, and care plan provided today and declined offer to receive copy of patient instructions, educational materials, and care plan.  The pharmacy team will reach out to the patient again over the next 90 days.   Orlando Penner, PharmD Clinical Pharmacist [facility] [phone]

## 2020-05-18 ENCOUNTER — Telehealth: Payer: Self-pay

## 2020-05-18 NOTE — Progress Notes (Signed)
    Chronic Care Management Pharmacy Assistant    Name: Nicole Nunez  MRN: [id number] DOB: February 27, 1946     Reason for Encounter: Medication Review     PCP : Glendale Chard, MD  Allergies:  No Known Allergies  Medications: Outpatient Encounter Medications as of 05/18/2020  Medication Sig  . aspirin EC 81 MG tablet Take 81 mg by mouth daily.  Marland Kitchen b complex vitamins tablet Take 1 tablet by mouth daily.  . Calcium Carbonate (CALCIUM 600 PO) Take 1 tablet by mouth daily.   . Cholecalciferol (VITAMIN D3) 125 MCG (5000 UT) CAPS Take 1 capsule by mouth daily.   . Coenzyme Q10 100 MG TABS Take 1 tablet by mouth daily.  Elmore Guise Devices (ONE TOUCH DELICA LANCING DEV) MISC USE AS DIRECTED TO CHECK BLOOD SUGARS ONCE DAILY dx: e11.22  . Lancets (ONETOUCH DELICA PLUS [id number]) MISC USE AS DIRECTED TO CHECK BLOOD SUGARS ONCE DAILY  . magnesium gluconate (MAGONATE) 500 MG tablet Take 500 mg by mouth daily.  . Multiple Vitamin (MULTIVITAMIN) tablet Take 1 tablet by mouth daily.  Marland Kitchen olmesartan (BENICAR) 20 MG tablet TAKE 1 TABLET BY MOUTH  DAILY  . ONETOUCH VERIO test strip USE AS DIRECTED TO CHECK  BLOOD SUGAR ONCE DAILY  . OZEMPIC, 0.25 OR 0.5 MG/DOSE, 2 MG/1.5ML SOPN INJECT SUBCUTANEOUSLY 0.5MG  ONCE A WEEK ON THURSDAY  . pantoprazole (PROTONIX) 40 MG tablet TAKE 1 TABLET(40 MG) BY MOUTH DAILY  . pioglitazone-metformin (ACTOPLUS MET) 15-500 MG tablet Take 1 tablet by mouth 2 (two) times daily.  . Pitavastatin Calcium 2 MG TABS Take 1 tablet by mouth daily. Monday, Wednesday, and Friday   No facility-administered encounter medications on file as of 05/18/2020.    Current Diagnosis: Patient Active Problem List   Diagnosis Date Noted  . Notalgia 01/29/2020  . Vitamin D deficiency disease 01/29/2020  . Class 1 obesity due to excess calories with serious comorbidity and body mass index (BMI) of 32.0 to 32.9 in adult 01/29/2020  . Type 2 diabetes mellitus with stage 3 chronic kidney disease,  without long-term current use of insulin ([place]) 05/24/2018  . Chronic renal disease, stage III ([place]) 05/24/2018  . Parenchymal renal hypertension 05/24/2018  . Pure hypercholesterolemia 05/24/2018  . Class 1 obesity due to excess calories with serious comorbidity and body mass index (BMI) of 31.0 to 31.9 in adult 05/24/2018      Follow-Up:  Pharmacist Review Reviewed chart and adherence measures.  Per insurance data medication adherence for diabetes medications 100% med compliance/ medication adherence for hypertension 100% med compliance.  Orlando Penner , CPP notified  Judithann Sheen, [place] Pharmacist Assistant [phone]

## 2020-05-21 ENCOUNTER — Other Ambulatory Visit: Payer: Self-pay | Admitting: Internal Medicine

## 2020-05-26 ENCOUNTER — Telehealth: Payer: Self-pay

## 2020-05-26 NOTE — Chronic Care Management (AMB) (Signed)
Chronic Care Management Pharmacy Assistant   Name: Nicole Nunez  MRN: [id number] DOB: [birth year]  Reason for Encounter: Disease State - Diabetes, Hypertension and Lipid Adherence Call.  Patient Questions:   1.  Have you seen any other providers since your last visit? No   2.  Any changes in your medicines or health? No   .  PCP : Glendale Chard, MD  Allergies:  No Known Allergies  Medications: Outpatient Encounter Medications as of 05/26/2020  Medication Sig   aspirin EC 81 MG tablet Take 81 mg by mouth daily.   b complex vitamins tablet Take 1 tablet by mouth daily.   Calcium Carbonate (CALCIUM 600 PO) Take 1 tablet by mouth daily.    Cholecalciferol (VITAMIN D3) 125 MCG (5000 UT) CAPS Take 1 capsule by mouth daily.    Coenzyme Q10 100 MG TABS Take 1 tablet by mouth daily.   Lancet Devices (ONE TOUCH DELICA LANCING DEV) MISC USE AS DIRECTED TO CHECK BLOOD SUGARS ONCE DAILY dx: e11.22   Lancets (ONETOUCH DELICA PLUS [id number]) MISC USE AS DIRECTED TO CHECK BLOOD SUGARS ONCE DAILY   magnesium gluconate (MAGONATE) 500 MG tablet Take 500 mg by mouth daily.   Multiple Vitamin (MULTIVITAMIN) tablet Take 1 tablet by mouth daily.   olmesartan (BENICAR) 20 MG tablet TAKE 1 TABLET BY MOUTH  DAILY   ONETOUCH VERIO test strip USE AS DIRECTED TO CHECK  BLOOD SUGAR ONCE DAILY   OZEMPIC, 0.25 OR 0.5 MG/DOSE, 2 MG/1.5ML SOPN INJECT SUBCUTANEOUSLY 0.5MG ONCE A WEEK ON THURSDAY   pantoprazole (PROTONIX) 40 MG tablet TAKE 1 TABLET(40 MG) BY MOUTH DAILY   pioglitazone-metformin (ACTOPLUS MET) 15-500 MG tablet TAKE 1 TABLET BY MOUTH  TWICE DAILY   Pitavastatin Calcium 2 MG TABS Take 1 tablet by mouth daily. Monday, Wednesday, and Friday   No facility-administered encounter medications on file as of 05/26/2020.    Current Diagnosis: Patient Active Problem List   Diagnosis Date Noted   Notalgia 01/29/2020   Vitamin D deficiency disease 01/29/2020   Class 1 obesity due  to excess calories with serious comorbidity and body mass index (BMI) of 32.0 to 32.9 in adult 01/29/2020   Type 2 diabetes mellitus with stage 3 chronic kidney disease, without long-term current use of insulin ([place]) 05/24/2018   Chronic renal disease, stage III ([place]) 05/24/2018   Parenchymal renal hypertension 05/24/2018   Pure hypercholesterolemia 05/24/2018   Class 1 obesity due to excess calories with serious comorbidity and body mass index (BMI) of 31.0 to 31.9 in adult 05/24/2018    Recent Relevant Labs: Lab Results  Component Value Date/Time   HGBA1C 6.2 (H) 01/29/2020 02:42 PM   HGBA1C 6.2 (H) 11/05/2019 10:51 AM   MICROALBUR 10 01/29/2020 12:51 PM   MICROALBUR 30 12/20/2018 12:33 PM    Kidney Function Lab Results  Component Value Date/Time   CREATININE 1.09 (H) 01/29/2020 02:42 PM   CREATININE 1.21 (H) 11/05/2019 10:51 AM   GFRNONAA 51 (L) 01/29/2020 02:42 PM   GFRAA 59 (L) 01/29/2020 02:42 PM     Current antihyperglycemic regimen:  o Ozempic - inject 0.5 mg once a week o Pioglitazone - Metformin 15-500 mg one a day   What recent interventions/DTPs have been made to improve glycemic control:  o Patient states she takes her medications as directed by providers.   Have there been any recent hospitalizations or ED visits since last visit with CPP? No   Patient denies hypoglycemic symptoms, including Sweaty,  Shaky, Hungry, Nervous/irritable and Vision changes  Patient denies hyperglycemic symptoms, including blurry vision, excessive thirst, fatigue, polyuria and weakness    How often are you checking your blood sugar? once daily    What are your blood sugars ranging?  o Fasting: 05/28/2019 - 121 o Fasting: 05/27/2019 - 115 o Before meals: None o After meals: None o Bedtime: None  During the week, how often does your blood glucose drop below 70? Never    Are you checking your feet daily/regularly? Yes, She states she check regularly .   Adherence  Review: Is the patient currently on a STATIN medication? No Is the patient currently on ACE/ARB medication? Yes Does the patient have >5 day gap between last estimated fill dates? No   Reviewed chart prior to disease state call. Spoke with patient regarding BP  Recent Office Vitals: BP Readings from Last 3 Encounters:  01/29/20 136/70  01/29/20 136/70  11/05/19 122/76   Pulse Readings from Last 3 Encounters:  01/29/20 83  01/29/20 83  11/05/19 86    Wt Readings from Last 3 Encounters:  01/29/20 158 lb 9.6 oz (71.9 kg)  01/29/20 158 lb 9.6 oz (71.9 kg)  11/05/19 156 lb 6.4 oz (70.9 kg)     Kidney Function Lab Results  Component Value Date/Time   CREATININE 1.09 (H) 01/29/2020 02:42 PM   CREATININE 1.21 (H) 11/05/2019 10:51 AM   GFRNONAA 51 (L) 01/29/2020 02:42 PM   GFRAA 59 (L) 01/29/2020 02:42 PM    BMP Latest Ref Rng & Units 01/29/2020 11/05/2019 07/30/2019  Glucose 65 - 99 mg/dL 91 95 96  BUN 8 - 27 mg/dL _0 Creatinine 0.57 - 1.00 mg/dL 1.09(H) 1.21(H) 1.21(H)  BUN/Creat Ratio 12 - _1 Sodium 134 - 144 mmol/L 141 140 142  Potassium 3.5 - 5.2 mmol/L 4.2 4.2 4.3  Chloride 96 - 106 mmol/L 103 103 105  CO2 20 - 29 mmol/L _2 Calcium 8.7 - 10.3 mg/dL 9.5 9.1 9.7     Current antihypertensive regimen:  o Olmesartan 20 mg one a day o   How often are you checking your Blood Pressure? Patient states she does not take at home.   Current home BP readings: None   What recent interventions/DTPs have been made by any provider to improve Blood Pressure control since last CPP Visit: Patient states she has been taking her medications as directed by providers.  Any recent hospitalizations or ED visits since last visit with CPP? No    What diet changes have been made to improve Blood Pressure Control?  o Patient states that she has not been eating very healthy, because of the holidays.   What exercise is being done to improve your Blood Pressure  Control?  o Patient states she is active / no exercise regimen.  Adherence Review: Is the patient currently on ACE/ARB medication? Yes Does the patient have >5 day gap between last estimated fill dates? No    Comprehensive medication review performed; Spoke to patient regarding cholesterol  Lipid Panel    Component Value Date/Time   CHOL 148 07/30/2019 1228   TRIG 58 07/30/2019 1228   HDL 66 07/30/2019 1228   LDLCALC 70 07/30/2019 1228    10-year ASCVD risk score: The 10-year ASCVD risk score Mikey Bussing DC Jr., et al., 2013) is: 25.7%   Values used to calculate the score:     Age: 69 years     Sex: Female  Is Non-Hispanic African American: Yes     Diabetic: Yes     Tobacco smoker: No     Systolic Blood Pressure: 161 mmHg     Is BP treated: Yes     HDL Cholesterol: 66 mg/dL     Total Cholesterol: 148 mg/dL   Current antihyperlipidemic regimen:  o Pitavastatin Calcium 2 mg tablet    Previous antihyperlipidemic medications tried: None   ASCVD risk enhancing conditions: age >38, DM and HTN    What recent interventions/DTPs have been made by any provider to improve Cholesterol control since last CPP Visit: Patient states she is taking her medications as directed by providers.  Any recent hospitalizations or ED visits since last visit with CPP? No   What diet changes have been made to improve Cholesterol?  o Patient states its the holidays, she has not been where she needs to be.  What exercise is being done to improve Cholesterol?  o Patient states she is active , no exercise regimen.   Adherence Review: Does the patient have >5 day gap between last estimated fill dates? No      Goals Addressed            This Visit's Progress    Pharmacy Care Plan   Not on track    CARE PLAN ENTRY  Current Barriers:   Chronic Disease Management support, education, and care coordination needs related to Hypertension, Hyperlipidemia, Diabetes, and Osteopenia    Hypertension  Pharmacist Clinical Goal(s): o Over the next 90 days, patient will work with PharmD and providers to maintain BP goal <130/80  Current regimen:  o Olmesartan 20mg  daily  Interventions: o Increase exercise regimen to 30 minutes 5 times weekly o Maintain heart healthy, balanced diet o Check blood pressure if symptomatic  Patient self care activities - Over the next 90 days, patient will: o Check BP if symptomatic, document, and provide at future appointments o Ensure daily salt intake < 2300 mg/day o Try to exercise for 30 minutes daily 5 times per week (150 minutes per week total)  Hyperlipidemia  Pharmacist Clinical Goal(s): o Over the next 90 days, patient will work with PharmD and providers to maintain LDL goal < 70  Current regimen:  o Livalo 2mg  three times weekly (Mon/Wed/Fri)  Interventions: o Provided pt with samples of Livalo 2mg  on 05/05/2020 - 3 month supply  Patient self care activities - Over the next 90 days, patient will: o Continue to limit fried foods o Take medications daily as directed o Try to exercise for 30 minutes daily 5 times per week (150 minutes per week total)  Diabetes  Pharmacist Clinical Goal(s): o Over the next 90 days, patient will work with PharmD and providers to maintain A1c goal <7%  Current regimen:  o Ozempic 0.5mg  weekly (Fridays) o Pioglitazone/Metformin 15/500mg  daily  Interventions: o Increase exercise to 30 minutes 5 times weekly o Encouraged continuation of diabetic-friendly, balanced diet o Completion of PAP paperwork for patients Ozempic for yearly renewal 2022  Patient self care activities - Over the next 90 days, patient will: o Check blood sugar once daily, document, and provide at future appointments o Contact provider with any episodes of hypoglycemia o Try to exercise for 30 minutes daily 5 times per week (150 minutes per week total)  Osteopenia   Pharmacist Clinical Goal(s): o Over the next 90  days, patient will work with PharmD and providers to build and maintain bone density  Current regimen:   Cholecalciferol  5000 units daily  Calcium carbonate 671m daily  Interventions: o Recommend patient increase calcium supplement to twice daily for a total of 12072mdaily o Discussed recommended calcium intake for osteopenia  o Will provide patient with resource regarding calcium content of common foods  Patient self care activities - Over the next 90 days, patient will: o Begin taking calcium 60025mwice daily (if she determines she does not get additional 600m14m calcium from diet daily)   Medication management  Pharmacist Clinical Goal(s): o Over the next 90 days, patient will work with PharmD and providers to maintain optimal medication adherence  Current pharmacy: [facility]  Interventions o Comprehensive medication review performed. o Continue current medication management strategy  Patient self care activities - Over the next 90 days, patient will: o Focus on medication adherence by utilizing weekly pill box to organize medications o Take medications as prescribed o Report any questions or concerns to PharmD and/or provider(s)  Please see past updates related to this goal by clicking on the "Past Updates" button in the selected goal         Follow-Up:  Pharmacist Review - Patient states that she is going by PCP office on 05/28/2020 to take her  Ozempic signed application and copy of income.  Patient also states that she has been without her Pitavastatin since last week , she is wanting to pick up some samples when she comes in to PCP office tomorrow , states that she always gets sample. Patient states that she has call PCP to let them know.  Patient states she has not been at goal because of holidays, with diet and exercise, she has been without her Pitavastatin since last Monday, she is taking all other medications  VallOrlando PennerPP Notified  VeroJudithann SheenMASandwichrmacist Assistant [phone]

## 2020-06-02 ENCOUNTER — Other Ambulatory Visit: Payer: Self-pay

## 2020-06-02 ENCOUNTER — Ambulatory Visit (INDEPENDENT_AMBULATORY_CARE_PROVIDER_SITE_OTHER): Payer: Medicare Other | Admitting: Internal Medicine

## 2020-06-02 ENCOUNTER — Encounter: Payer: Self-pay | Admitting: Internal Medicine

## 2020-06-02 VITALS — BP 118/76 | HR 83 | Temp 97.8°F | Ht 58.4 in | Wt 161.0 lb

## 2020-06-02 DIAGNOSIS — N183 Chronic kidney disease, stage 3 unspecified: Secondary | ICD-10-CM

## 2020-06-02 DIAGNOSIS — E1122 Type 2 diabetes mellitus with diabetic chronic kidney disease: Secondary | ICD-10-CM

## 2020-06-02 DIAGNOSIS — K219 Gastro-esophageal reflux disease without esophagitis: Secondary | ICD-10-CM | POA: Diagnosis not present

## 2020-06-02 DIAGNOSIS — E6609 Other obesity due to excess calories: Secondary | ICD-10-CM

## 2020-06-02 DIAGNOSIS — Z6833 Body mass index (BMI) 33.0-33.9, adult: Secondary | ICD-10-CM

## 2020-06-02 DIAGNOSIS — I129 Hypertensive chronic kidney disease with stage 1 through stage 4 chronic kidney disease, or unspecified chronic kidney disease: Secondary | ICD-10-CM

## 2020-06-02 DIAGNOSIS — Z79899 Other long term (current) drug therapy: Secondary | ICD-10-CM

## 2020-06-02 MED ORDER — OZEMPIC (0.25 OR 0.5 MG/DOSE) 2 MG/1.5ML ~~LOC~~ SOPN: PEN_INJECTOR | SUBCUTANEOUS | 1 refills | Status: DC

## 2020-06-02 NOTE — Patient Instructions (Addendum)
Take Dexilant once daily in AM Take FD-Gard in evenings if needed   Diabetes Mellitus and Foot Care Foot care is an important part of your health, especially when you have diabetes. Diabetes may cause you to have problems because of poor blood flow (circulation) to your feet and legs, which can cause your skin to:  Become thinner and drier.  Break more easily.  Heal more slowly.  Peel and crack. You may also have nerve damage (neuropathy) in your legs and feet, causing decreased feeling in them. This means that you may not notice minor injuries to your feet that could lead to more serious problems. Noticing and addressing any potential problems early is the best way to prevent future foot problems. How to care for your feet Foot hygiene  Wash your feet daily with warm water and mild soap. Do not use hot water. Then, pat your feet and the areas between your toes until they are completely dry. Do not soak your feet as this can dry your skin.  Trim your toenails straight across. Do not dig under them or around the cuticle. File the edges of your nails with an emery board or nail file.  Apply a moisturizing lotion or petroleum jelly to the skin on your feet and to dry, brittle toenails. Use lotion that does not contain alcohol and is unscented. Do not apply lotion between your toes.   Shoes and socks  Wear clean socks or stockings every day. Make sure they are not too tight. Do not wear knee-high stockings since they may decrease blood flow to your legs.  Wear shoes that fit properly and have enough cushioning. Always look in your shoes before you put them on to be sure there are no objects inside.  To break in new shoes, wear them for just a few hours a day. This prevents injuries on your feet. Wounds, scrapes, corns, and calluses  Check your feet daily for blisters, cuts, bruises, sores, and redness. If you cannot see the bottom of your feet, use a mirror or ask someone for help.  Do  not cut corns or calluses or try to remove them with medicine.  If you find a minor scrape, cut, or break in the skin on your feet, keep it and the skin around it clean and dry. You may clean these areas with mild soap and water. Do not clean the area with peroxide, alcohol, or iodine.  If you have a wound, scrape, corn, or callus on your foot, look at it several times a day to make sure it is healing and not infected. Check for: ? Redness, swelling, or pain. ? Fluid or blood. ? Warmth. ? Pus or a bad smell.   General tips  Do not cross your legs. This may decrease blood flow to your feet.  Do not use heating pads or hot water bottles on your feet. They may burn your skin. If you have lost feeling in your feet or legs, you may not know this is happening until it is too late.  Protect your feet from hot and cold by wearing shoes, such as at the beach or on hot pavement.  Schedule a complete foot exam at least once a year (annually) or more often if you have foot problems. Report any cuts, sores, or bruises to your health care provider immediately. Where to find more information  American Diabetes Association: www.diabetes.org  Association of Diabetes Care & Education Specialists: www.diabeteseducator.org Contact a health care provider  if:  You have a medical condition that increases your risk of infection and you have any cuts, sores, or bruises on your feet.  You have an injury that is not healing.  You have redness on your legs or feet.  You feel burning or tingling in your legs or feet.  You have pain or cramps in your legs and feet.  Your legs or feet are numb.  Your feet always feel cold.  You have pain around any toenails. Get help right away if:  You have a wound, scrape, corn, or callus on your foot and: ? You have pain, swelling, or redness that gets worse. ? You have fluid or blood coming from the wound, scrape, corn, or callus. ? Your wound, scrape, corn, or  callus feels warm to the touch. ? You have pus or a bad smell coming from the wound, scrape, corn, or callus. ? You have a fever. ? You have a red line going up your leg. Summary  Check your feet every day for blisters, cuts, bruises, sores, and redness.  Apply a moisturizing lotion or petroleum jelly to the skin on your feet and to dry, brittle toenails.  Wear shoes that fit properly and have enough cushioning.  If you have foot problems, report any cuts, sores, or bruises to your health care provider immediately.  Schedule a complete foot exam at least once a year (annually) or more often if you have foot problems. This information is not intended to replace advice given to you by your health care provider. Make sure you discuss any questions you have with your health care provider. Document Revised: 11/28/2019 Document Reviewed: 11/28/2019 Elsevier Patient Education  [place].

## 2020-06-02 NOTE — Progress Notes (Signed)
I,Katawbba Wiggins,acting as a Education administrator for Maximino Greenland, MD.,have documented all relevant documentation on the behalf of Maximino Greenland, MD,as directed by  Maximino Greenland, MD while in the presence of Maximino Greenland, MD.  This visit occurred during the SARS-CoV-2 public health emergency.  Safety protocols were in place, including screening questions prior to the visit, additional usage of staff PPE, and extensive cleaning of exam room while observing appropriate contact time as indicated for disinfecting solutions.  Subjective:     Patient ID: Nicole Nunez , female    DOB: [birth year] , 75 y.o.   MRN: [id number]   Chief Complaint  Patient presents with  . Diabetes  . Hypertension  . Hyperlipidemia    HPI  She is here for a diabetes/htn check. She reports compliance with meds. She plans to resume going to the gym in the near future. She denies headaches, chest pain and shortness of breath.   Diabetes She presents for her follow-up diabetic visit. She has type 2 diabetes mellitus. Her disease course has been stable. There are no hypoglycemic associated symptoms. Pertinent negatives for diabetes include no blurred vision. There are no hypoglycemic complications. Current diabetic treatment includes oral agent (dual therapy). She is compliant with treatment most of the time. She is following a diabetic diet. She participates in exercise intermittently. An ACE inhibitor/angiotensin II receptor blocker is being taken. Eye exam is current.  Hypertension This is a chronic problem. The current episode started more than 1 year ago. The problem has been gradually improving since onset. The problem is controlled. Pertinent negatives include no blurred vision or palpitations. Hypertensive end-organ damage includes kidney disease.     Past Medical History:  Diagnosis Date  . CKD stage 3 due to type 2 diabetes mellitus ([place])   . DM (diabetes mellitus) ([place])   . High cholesterol   . HTN  (hypertension)      Family History  Problem Relation Age of Onset  . Asthma Mother   . Hypertension Mother   . Alzheimer's disease Mother   . Early death Father      Current Outpatient Medications:  .  aspirin EC 81 MG tablet, Take 81 mg by mouth daily., Disp: , Rfl:  .  b complex vitamins tablet, Take 1 tablet by mouth daily., Disp: , Rfl:  .  Calcium Carbonate (CALCIUM 600 PO), Take 1 tablet by mouth daily. , Disp: , Rfl:  .  Cholecalciferol (VITAMIN D3) 125 MCG (5000 UT) CAPS, Take 1 capsule by mouth daily. , Disp: , Rfl:  .  Coenzyme Q10 100 MG TABS, Take 1 tablet by mouth daily., Disp: , Rfl:  .  Lancet Devices (ONE TOUCH DELICA LANCING DEV) MISC, USE AS DIRECTED TO CHECK BLOOD SUGARS ONCE DAILY dx: e11.22, Disp: 1 each, Rfl: 2 .  Lancets (ONETOUCH DELICA PLUS [id number]) MISC, USE AS DIRECTED TO CHECK BLOOD SUGARS ONCE DAILY, Disp: 100 each, Rfl: 11 .  magnesium gluconate (MAGONATE) 500 MG tablet, Take 500 mg by mouth daily., Disp: , Rfl:  .  Multiple Vitamin (MULTIVITAMIN) tablet, Take 1 tablet by mouth daily., Disp: , Rfl:  .  olmesartan (BENICAR) 20 MG tablet, TAKE 1 TABLET BY MOUTH  DAILY, Disp: 90 tablet, Rfl: 1 .  ONETOUCH VERIO test strip, USE AS DIRECTED TO CHECK  BLOOD SUGAR ONCE DAILY, Disp: 100 strip, Rfl: 3 .  pantoprazole (PROTONIX) 40 MG tablet, TAKE 1 TABLET(40 MG) BY MOUTH DAILY, Disp: 90 tablet, Rfl: 1 .  pioglitazone-metformin (ACTOPLUS MET) 15-500 MG tablet, TAKE 1 TABLET BY MOUTH  TWICE DAILY, Disp: 180 tablet, Rfl: 3 .  Pitavastatin Calcium 2 MG TABS, Take 1 tablet by mouth daily. Monday, Wednesday, and Friday, Disp: , Rfl:  .  Semaglutide,0.25 or 0.5MG/DOS, (OZEMPIC, 0.25 OR 0.5 MG/DOSE,) 2 MG/1.5ML SOPN, INJECT SUBCUTANEOUSLY 0.5MG ONCE A WEEK ON THURSDAY, Disp: 1.5 mL, Rfl: 1   No Known Allergies   Review of Systems  Constitutional: Negative.   Eyes: Negative for blurred vision.  Respiratory: Negative.   Cardiovascular: Negative.  Negative for  palpitations.  Gastrointestinal: Positive for abdominal pain.       She c/o increased belching and occ. Abdominal discomfort. Has been taking pantoprazole. Tries to eat dinner at 630, but sometimes later. No vomiting diarrhea. Sometimes awakens with nausea.   Psychiatric/Behavioral: Negative.   All other systems reviewed and are negative.    Today's Vitals   06/02/20 0842  BP: 118/76  Pulse: 83  Temp: 97.8 F (36.6 C)  TempSrc: Oral  Weight: 161 lb (73 kg)  Height: 4' 10.4" (1.483 m)   Body mass index is 33.19 kg/m.   Objective:  Physical Exam Vitals and nursing note reviewed.  Constitutional:      Appearance: Normal appearance. She is obese.  HENT:     Head: Normocephalic and atraumatic.  Cardiovascular:     Rate and Rhythm: Normal rate and regular rhythm.     Heart sounds: Normal heart sounds.  Pulmonary:     Breath sounds: Normal breath sounds.  Skin:    General: Skin is warm.  Neurological:     General: No focal deficit present.     Mental Status: She is alert and oriented to person, place, and time.         Assessment And Plan:     1. Type 2 diabetes mellitus with stage 3 chronic kidney disease, without long-term current use of insulin, unspecified whether stage 3a or 3b CKD ([place]) Comments: Chronic, I will check labs as listed below. Encouraged to incorporate more exercise into her daily routine.  - Hemoglobin A1c - Lipid panel - CMP14+EGFR  2. 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 She will continue with current meds.   3. Gastroesophageal reflux disease without esophagitis Comments: CHRONIC. Advised to stop pantoprazole and take Dexilant daily. May take FD-gard once daily in evenings as needed.   4. Class 1 obesity due to excess calories with serious comorbidity and body mass index (BMI) of 33.0 to 33.9 in adult Comments: She is encouraged to strive for BMI less  than 30 to decrease cardiac risk. Advised to aim for at least 150 minutes of exercise per week.   5. Drug therapy Comments: She has been on PPI therapy. I will check vitamin B12 level.  - Vitamin B12  Patient was given opportunity to ask questions. Patient verbalized understanding of the plan and was able to repeat key elements of the plan. All questions were answered to their satisfaction.  Maximino Greenland, MD   I, Maximino Greenland, MD, have reviewed all documentation for this visit. The documentation on 06/02/20 for the exam, diagnosis, procedures, and orders are all accurate and complete.  THE PATIENT IS ENCOURAGED TO PRACTICE SOCIAL DISTANCING DUE TO THE COVID-19 PANDEMIC.

## 2020-06-03 ENCOUNTER — Other Ambulatory Visit: Payer: Self-pay

## 2020-06-03 LAB — CMP14+EGFR
ALT: 15 IU/L (ref 0–32)
AST: 20 IU/L (ref 0–40)
Albumin/Globulin Ratio: 1.7 (ref 1.2–2.2)
Albumin: 4.3 g/dL (ref 3.7–4.7)
Alkaline Phosphatase: 98 IU/L (ref 44–121)
BUN/Creatinine Ratio: 16 (ref 12–28)
BUN: 21 mg/dL (ref 8–27)
Bilirubin Total: 0.5 mg/dL (ref 0.0–1.2)
CO2: 24 mmol/L (ref 20–29)
Calcium: 9.4 mg/dL (ref 8.7–10.3)
Chloride: 101 mmol/L (ref 96–106)
Creatinine, Ser: 1.31 mg/dL — ABNORMAL HIGH (ref 0.57–1.00)
GFR calc Af Amer: 47 mL/min/{1.73_m2} — ABNORMAL LOW (ref >59)
GFR calc non Af Amer: 41 mL/min/{1.73_m2} — ABNORMAL LOW (ref >59)
Globulin, Total: 2.6 g/dL (ref 1.5–4.5)
Glucose: 113 mg/dL — ABNORMAL HIGH (ref 65–99)
Potassium: 4.4 mmol/L (ref 3.5–5.2)
Sodium: 137 mmol/L (ref 134–144)
Total Protein: 6.9 g/dL (ref 6.0–8.5)

## 2020-06-03 LAB — HEMOGLOBIN A1C
Est. average glucose Bld gHb Est-mCnc: 143 mg/dL
Hgb A1c MFr Bld: 6.6 % — ABNORMAL HIGH (ref 4.8–5.6)

## 2020-06-03 LAB — VITAMIN B12: Vitamin B-12: 938 pg/mL (ref 232–1245)

## 2020-06-03 NOTE — Telephone Encounter (Signed)
The pt was notified that when the prior auth was done for the pt's Ozempic on cover my meds and the medication is on the pt's formulary plan. The pt said that she was told that she needed to take her new insurance card to the pharmacy and that is why it was $1000.  The pt was told to make sure she takes her card to her pharmacy and to let the office know if she has any issues.

## 2020-06-05 LAB — SPECIMEN STATUS REPORT

## 2020-06-05 LAB — LIPID PANEL
Chol/HDL Ratio: 2.9 ratio (ref 0.0–4.4)
Cholesterol, Total: 185 mg/dL (ref 100–199)
HDL: 64 mg/dL (ref >39)
LDL Chol Calc (NIH): 108 mg/dL — ABNORMAL HIGH (ref 0–99)
Triglycerides: 68 mg/dL (ref 0–149)
VLDL Cholesterol Cal: 13 mg/dL (ref 5–40)

## 2020-06-15 ENCOUNTER — Telehealth: Payer: Self-pay

## 2020-06-15 NOTE — Chronic Care Management (AMB) (Signed)
° ° °  Chronic Care Management Pharmacy Assistant   Name: Nicole Nunez  MRN: [id number] DOB: [birth year]  Reason for Encounter: Medication Review    PCP : Glendale Chard, MD  Allergies:  No Known Allergies  Medications: Outpatient Encounter Medications as of 06/15/2020  Medication Sig   aspirin EC 81 MG tablet Take 81 mg by mouth daily.   b complex vitamins tablet Take 1 tablet by mouth daily.   Calcium Carbonate (CALCIUM 600 PO) Take 1 tablet by mouth daily.    Cholecalciferol (VITAMIN D3) 125 MCG (5000 UT) CAPS Take 1 capsule by mouth daily.    Coenzyme Q10 100 MG TABS Take 1 tablet by mouth daily.   Lancet Devices (ONE TOUCH DELICA LANCING DEV) MISC USE AS DIRECTED TO CHECK BLOOD SUGARS ONCE DAILY dx: e11.22   Lancets (ONETOUCH DELICA PLUS [id number]) MISC USE AS DIRECTED TO CHECK BLOOD SUGARS ONCE DAILY   magnesium gluconate (MAGONATE) 500 MG tablet Take 500 mg by mouth daily.   Multiple Vitamin (MULTIVITAMIN) tablet Take 1 tablet by mouth daily.   olmesartan (BENICAR) 20 MG tablet TAKE 1 TABLET BY MOUTH  DAILY   ONETOUCH VERIO test strip USE AS DIRECTED TO CHECK  BLOOD SUGAR ONCE DAILY   pantoprazole (PROTONIX) 40 MG tablet TAKE 1 TABLET(40 MG) BY MOUTH DAILY   pioglitazone-metformin (ACTOPLUS MET) 15-500 MG tablet TAKE 1 TABLET BY MOUTH  TWICE DAILY   Pitavastatin Calcium 2 MG TABS Take 1 tablet by mouth daily. Monday, Wednesday, and Friday   Semaglutide,0.25 or 0.5MG/DOS, (OZEMPIC, 0.25 OR 0.5 MG/DOSE,) 2 MG/1.5ML SOPN INJECT SUBCUTANEOUSLY 0.5MG ONCE A WEEK ON THURSDAY   No facility-administered encounter medications on file as of 06/15/2020.    Current Diagnosis: Patient Active Problem List   Diagnosis Date Noted   Notalgia 01/29/2020   Vitamin D deficiency disease 01/29/2020   Class 1 obesity due to excess calories with serious comorbidity and body mass index (BMI) of 32.0 to 32.9 in adult 01/29/2020   Type 2 diabetes mellitus with stage 3 chronic  kidney disease, without long-term current use of insulin ([place]) 05/24/2018   Chronic renal disease, stage III ([address]) 05/24/2018   Parenchymal renal hypertension 05/24/2018   Pure hypercholesterolemia 05/24/2018   Class 1 obesity due to excess calories with serious comorbidity and body mass index (BMI) of 31.0 to 31.9 in adult 05/24/2018      Follow-Up:  Pharmacist Review - Reviewed chart and adherence measures. Per Insurance data medication adherence for diabetes is 100% med compliance . Medication adherence for hypertension is 100 % med compliance.    Vallie Pearson,CPP Notified  Judithann Sheen, [facility] Clinical Pharmacist Assistant [phone]

## 2020-06-30 ENCOUNTER — Telehealth: Payer: Self-pay

## 2020-06-30 NOTE — Telephone Encounter (Signed)
Told pt at her earliest convenience to come by the office to pick up ozempic sample.

## 2020-07-01 ENCOUNTER — Telehealth: Payer: Self-pay

## 2020-07-01 NOTE — Chronic Care Management (AMB) (Signed)
Chronic Care Management Pharmacy Assistant   Name: Nicole Nunez  MRN: [id number] DOB: March 14, 1946  Reason for Encounter: Hypertension-Diabetes-Hyperlipidemia Adherence Call  Patient Questions:  1.  Have you seen any other providers since your last visit? Yes, 06/02/20-Sanders, Bailey Mech, MD. (CCM)    2.  Any changes in your medicines or health? No    PCP : Glendale Chard, MD  Allergies:  No Known Allergies  Medications: Outpatient Encounter Medications as of 07/01/2020  Medication Sig   aspirin EC 81 MG tablet Take 81 mg by mouth daily.   b complex vitamins tablet Take 1 tablet by mouth daily.   Calcium Carbonate (CALCIUM 600 PO) Take 1 tablet by mouth daily.    Cholecalciferol (VITAMIN D3) 125 MCG (5000 UT) CAPS Take 1 capsule by mouth daily.    Coenzyme Q10 100 MG TABS Take 1 tablet by mouth daily.   Lancet Devices (ONE TOUCH DELICA LANCING DEV) MISC USE AS DIRECTED TO CHECK BLOOD SUGARS ONCE DAILY dx: e11.22   Lancets (ONETOUCH DELICA PLUS [id number]) MISC USE AS DIRECTED TO CHECK BLOOD SUGARS ONCE DAILY   magnesium gluconate (MAGONATE) 500 MG tablet Take 500 mg by mouth daily.   Multiple Vitamin (MULTIVITAMIN) tablet Take 1 tablet by mouth daily.   olmesartan (BENICAR) 20 MG tablet TAKE 1 TABLET BY MOUTH  DAILY   ONETOUCH VERIO test strip USE AS DIRECTED TO CHECK  BLOOD SUGAR ONCE DAILY   pantoprazole (PROTONIX) 40 MG tablet TAKE 1 TABLET(40 MG) BY MOUTH DAILY   pioglitazone-metformin (ACTOPLUS MET) 15-500 MG tablet TAKE 1 TABLET BY MOUTH  TWICE DAILY   Pitavastatin Calcium 2 MG TABS Take 1 tablet by mouth daily. Monday, Wednesday, and Friday   Semaglutide,0.25 or 0.5MG/DOS, (OZEMPIC, 0.25 OR 0.5 MG/DOSE,) 2 MG/1.5ML SOPN INJECT SUBCUTANEOUSLY 0.5MG ONCE A WEEK ON THURSDAY   No facility-administered encounter medications on file as of 07/01/2020.    Current Diagnosis: Patient Active Problem List   Diagnosis Date Noted   Notalgia 01/29/2020   Vitamin D  deficiency disease 01/29/2020   Class 1 obesity due to excess calories with serious comorbidity and body mass index (BMI) of 32.0 to 32.9 in adult 01/29/2020   Type 2 diabetes mellitus with stage 3 chronic kidney disease, without long-term current use of insulin ([place]) 05/24/2018   Chronic renal disease, stage III ([place]) 05/24/2018   Parenchymal renal hypertension 05/24/2018   Pure hypercholesterolemia 05/24/2018   Class 1 obesity due to excess calories with serious comorbidity and body mass index (BMI) of 31.0 to 31.9 in adult 05/24/2018   Reviewed chart prior to disease state call. Spoke with patient regarding BP  Recent Office Vitals: BP Readings from Last 3 Encounters:  06/02/20 118/76  01/29/20 136/70  01/29/20 136/70   Pulse Readings from Last 3 Encounters:  06/02/20 83  01/29/20 83  01/29/20 83    Wt Readings from Last 3 Encounters:  06/02/20 161 lb (73 kg)  01/29/20 158 lb 9.6 oz (71.9 kg)  01/29/20 158 lb 9.6 oz (71.9 kg)     Kidney Function Lab Results  Component Value Date/Time   CREATININE 1.31 (H) 06/02/2020 09:42 AM   CREATININE 1.09 (H) 01/29/2020 02:42 PM   GFRNONAA 41 (L) 06/02/2020 09:42 AM   GFRAA 47 (L) 06/02/2020 09:42 AM    BMP Latest Ref Rng & Units 06/02/2020 01/29/2020 11/05/2019  Glucose 65 - 99 mg/dL 113(H) 91 95  BUN 8 - 27 mg/dL '21 23 17  ' Creatinine 0.57 - 1.00  mg/dL 1.31(H) 1.09(H) 1.21(H)  BUN/Creat Ratio 12 - '28 16 21 14  ' Sodium 134 - 144 mmol/L 137 141 140  Potassium 3.5 - 5.2 mmol/L 4.4 4.2 4.2  Chloride 96 - 106 mmol/L 101 103 103  CO2 20 - 29 mmol/L '24 26 24  ' Calcium 8.7 - 10.3 mg/dL 9.4 9.5 9.1     Current antihypertensive regimen:   Olmesartan 71m daily   How often are you checking your Blood Pressure? None . Patient states she does not check at home and she does have a BP cuff.    Current home BP readings: None    What recent interventions/DTPs have been made by any provider to improve Blood Pressure control since  last CPP Visit:    Any recent hospitalizations or ED visits since last visit with CPP? Yes    What diet changes have been made to improve Blood Pressure Control?  o Recent interventions was to check BP if symptomatic, document, and provide at future appointments. Ensure daily salt intake < 2300 mg/day. Try to exercise for 30 minutes daily 5 times per week (150 minutes per week total).   What exercise is being done to improve your Blood Pressure Control?  o Patient states she is physically active but due to it being so cold outside she has not been exercising for 30 minutes daily as recommended but states she does plan to get back on her regimen once the weather breaks.   Adherence Review: Is the patient currently on ACE/ARB medication? Yes Does the patient have >5 day gap between last estimated fill dates? No   Recent Relevant Labs: Lab Results  Component Value Date/Time   HGBA1C 6.6 (H) 06/02/2020 09:42 AM   HGBA1C 6.2 (H) 01/29/2020 02:42 PM   MICROALBUR 10 01/29/2020 12:51 PM   MICROALBUR 30 12/20/2018 12:33 PM    Kidney Function Lab Results  Component Value Date/Time   CREATININE 1.31 (H) 06/02/2020 09:42 AM   CREATININE 1.09 (H) 01/29/2020 02:42 PM   GFRNONAA 41 (L) 06/02/2020 09:42 AM   GFRAA 47 (L) 06/02/2020 09:42 AM     Current antihyperglycemic regimen:  ? Ozempic 0.581mweekly (Fridays)  Pioglitazone/Metformin 15/50057maily   What recent interventions/DTPs have been made to improve glycemic control:  o Patient reports she is taking her medications as directed.    Have there been any recent hospitalizations or ED visits since last visit with CPP? No    Patient denies hypoglycemic symptoms, including Pale, Sweaty, Shaky, Hungry, Nervous/irritable and Vision changes    Patient denies hyperglycemic symptoms, including blurry vision, excessive thirst, fatigue, polyuria and weakness    How often are you checking your blood sugar? once daily. Patient states  she is only checking her FBG's.    What are your blood sugars ranging?  o Fasting: 120 am 07/07/20. 100 am 07/06/20. o Before meals: None o After meals: None  o Bedtime: None   During the week, how often does your blood glucose drop below 70? Never    Are you checking your feet daily/regularly? Patient states she is checking her feet daily.  Adherence Review: Is the patient currently on a STATIN medication? Yes Is the patient currently on ACE/ARB medication? Yes Does the patient have >5 day gap between last estimated fill dates? No   07/07/2020 Name: Nicole SAVARDN: [id number]: 7/126-Feb-1947aNASHAYLA TELLERIA a 74 9o. year old female who is a primary care patient of [place] ChardD.  Comprehensive medication  review performed; Spoke to patient regarding cholesterol  Lipid Panel    Component Value Date/Time   CHOL 185 06/02/2020 0942   TRIG 68 06/02/2020 0942   HDL 64 06/02/2020 0942   LDLCALC 108 (H) 06/02/2020 0942    10-year ASCVD risk score: The 10-year ASCVD risk score Mikey Bussing DC Jr., et al., 2013) is: 24.6%   Values used to calculate the score:     Age: 3 years     Sex: Female     Is Non-Hispanic African American: Yes     Diabetic: Yes     Tobacco smoker: No     Systolic Blood Pressure: 256 mmHg     Is BP treated: Yes     HDL Cholesterol: 64 mg/dL     Total Cholesterol: 185 mg/dL   Current antihyperlipidemic regimen:  ? Livalo 33m three times weekly (Mon/Wed/Fri)   Previous antihyperlipidemic medications tried: None   ASCVD risk enhancing conditions: age >>70 DM and HTN    What recent interventions/DTPs have been made by any provider to improve Cholesterol control since last CPP Visit:  - Recent interventions was to continue to limit fried foods, Take medications daily as directed, Try to exercise for 30 minutes daily 5 times per week (150 minutes per week total).   Any recent hospitalizations or ED visits since last visit with CPP? No     What diet changes have been made to improve Cholesterol?  o Patient states she doesn't eat a lot of fried foods. Reports she has been eating more green leafy vegetables.    What exercise is being done to improve Cholesterol?  o Patient states she is physically active but due to it being so cold outside she has not been exercising for 30 minutes daily as recommended but states she does plan to get back on her regimen once the weather breaks.  Adherence Review: Does the patient have >5 day gap between last estimated fill dates? No     Goals Addressed            This Visit's Progress    Pharmacy Care Plan   Not on track    CARE PLAN ENTRY  Current Barriers:   Chronic Disease Management support, education, and care coordination needs related to Hypertension, Hyperlipidemia, Diabetes, and Osteopenia   Hypertension  Pharmacist Clinical Goal(s): o Over the next 90 days, patient will work with PharmD and providers to maintain BP goal <130/80  Current regimen:  o Olmesartan 228mdaily  Interventions: o Increase exercise regimen to 30 minutes 5 times weekly o Maintain heart healthy, balanced diet o Check blood pressure if symptomatic  Patient self care activities - Over the next 90 days, patient will: o Check BP if symptomatic, document, and provide at future appointments o Ensure daily salt intake < 2300 mg/day o Try to exercise for 30 minutes daily 5 times per week (150 minutes per week total)  Hyperlipidemia  Pharmacist Clinical Goal(s): o Over the next 90 days, patient will work with PharmD and providers to maintain LDL goal < 70  Current regimen:  o Livalo 21m84mhree times weekly (Mon/Wed/Fri)  Interventions: o Provided pt with samples of Livalo 21mg821m 05/05/2020 - 3 month supply  Patient self care activities - Over the next 90 days, patient will: o Continue to limit fried foods o Take medications daily as directed o Try to exercise for 30 minutes daily 5  times per week (150 minutes per week total)  Diabetes  Pharmacist  Clinical Goal(s): o Over the next 90 days, patient will work with PharmD and providers to maintain A1c goal <7%  Current regimen:  o Ozempic 0.31m weekly (Fridays) o Pioglitazone/Metformin 15/5065mdaily  Interventions: o Increase exercise to 30 minutes 5 times weekly o Encouraged continuation of diabetic-friendly, balanced diet o Completion of PAP paperwork for patients Ozempic for yearly renewal 2022  Patient self care activities - Over the next 90 days, patient will: o Check blood sugar once daily, document, and provide at future appointments o Contact provider with any episodes of hypoglycemia o Try to exercise for 30 minutes daily 5 times per week (150 minutes per week total)  Osteopenia   Pharmacist Clinical Goal(s): o Over the next 90 days, patient will work with PharmD and providers to build and maintain bone density  Current regimen:   Cholecalciferol 5000 units daily  Calcium carbonate 60031maily  Interventions: o Recommend patient increase calcium supplement to twice daily for a total of 1200m75mily o Discussed recommended calcium intake for osteopenia  o Will provide patient with resource regarding calcium content of common foods  Patient self care activities - Over the next 90 days, patient will: o Begin taking calcium 600mg12mce daily (if she determines she does not get additional 600mg 19malcium from diet daily)   Medication management  Pharmacist Clinical Goal(s): o Over the next 90 days, patient will work with PharmD and providers to maintain optimal medication adherence  [facility]  Interventions o Comprehensive medication review performed. o Continue current medication management strategy  Patient self care activities - Over the next 90 days, patient will: o Focus on medication adherence by utilizing weekly pill box to organize  medications o Take medications as prescribed o Report any questions or concerns to PharmD and/or provider(s)  Please see past updates related to this goal by clicking on the "Past Updates" button in the selected goal         Follow-Up:  Pharmacist Review-Patient reports she goes to see her foot doctor every three or four months.  .ValliOrlando PennerNotified.  CandicRaynelle HighlandClRichfieldacist Assistant ([phone]) [phone] Time: 30 minutes

## 2020-07-06 ENCOUNTER — Other Ambulatory Visit: Payer: Self-pay

## 2020-07-06 MED ORDER — DEXLANSOPRAZOLE 60 MG PO CPDR: 60.0000 mg | DELAYED_RELEASE_CAPSULE | Freq: Every day | ORAL | 1 refills | Status: DC

## 2020-07-13 DIAGNOSIS — B351 Tinea unguium: Secondary | ICD-10-CM | POA: Diagnosis not present

## 2020-07-13 DIAGNOSIS — E118 Type 2 diabetes mellitus with unspecified complications: Secondary | ICD-10-CM | POA: Diagnosis not present

## 2020-07-13 DIAGNOSIS — L84 Corns and callosities: Secondary | ICD-10-CM | POA: Diagnosis not present

## 2020-07-16 ENCOUNTER — Telehealth: Payer: Self-pay

## 2020-07-16 ENCOUNTER — Other Ambulatory Visit: Payer: Self-pay | Admitting: Internal Medicine

## 2020-07-16 ENCOUNTER — Other Ambulatory Visit: Payer: Self-pay

## 2020-07-16 MED ORDER — PANTOPRAZOLE SODIUM 40 MG PO TBEC: DELAYED_RELEASE_TABLET | ORAL | 1 refills | Status: DC

## 2020-07-16 NOTE — Telephone Encounter (Signed)
The pt said yes, med sent to the pharmacy.

## 2020-07-16 NOTE — Telephone Encounter (Signed)
-----   Message from Glendale Chard, MD sent at 07/15/2020  8:53 PM EST ----- Does she want to try pantoprazole? If yes, try 40mg  once daily. #30/1 refill ----- Message ----- From: Michelle Nasuti, [place] Sent: 07/15/2020   4:20 PM EST To: Glendale Chard, MD  The pt wanted to let Dr. Baird Cancer know that her insurance denied coverage for the [place] and wanted to know if Dr Baird Cancer wanted to try her on a different medication.

## 2020-07-21 ENCOUNTER — Other Ambulatory Visit: Payer: Self-pay | Admitting: Internal Medicine

## 2020-07-30 DIAGNOSIS — H43821 Vitreomacular adhesion, right eye: Secondary | ICD-10-CM | POA: Diagnosis not present

## 2020-07-30 DIAGNOSIS — E113211 Type 2 diabetes mellitus with mild nonproliferative diabetic retinopathy with macular edema, right eye: Secondary | ICD-10-CM | POA: Diagnosis not present

## 2020-07-30 DIAGNOSIS — E113292 Type 2 diabetes mellitus with mild nonproliferative diabetic retinopathy without macular edema, left eye: Secondary | ICD-10-CM | POA: Diagnosis not present

## 2020-07-30 DIAGNOSIS — Z961 Presence of intraocular lens: Secondary | ICD-10-CM | POA: Diagnosis not present

## 2020-07-30 LAB — HM DIABETES EYE EXAM

## 2020-08-03 ENCOUNTER — Telehealth: Payer: Self-pay

## 2020-08-03 NOTE — Progress Notes (Signed)
08/03/20-Attempted to outreach the patient to remind her of appointment tomorrow 08/04/20 at 8:30 AM with Orlando Penner, CPP. No asnswer, left a voicemail with appointment date and time. Also made the patient aware in voicemail to have her medications and supplements near during call with the clinical pharmacist.  Orlando Penner, CPP Notified.  Raynelle Highland, [place] Pharmacist Assistant [phone]

## 2020-08-04 ENCOUNTER — Other Ambulatory Visit: Payer: Self-pay

## 2020-08-04 ENCOUNTER — Ambulatory Visit (INDEPENDENT_AMBULATORY_CARE_PROVIDER_SITE_OTHER): Payer: Medicare Other

## 2020-08-04 DIAGNOSIS — E7800 Pure hypercholesterolemia, unspecified: Secondary | ICD-10-CM | POA: Diagnosis not present

## 2020-08-04 DIAGNOSIS — K219 Gastro-esophageal reflux disease without esophagitis: Secondary | ICD-10-CM

## 2020-08-04 DIAGNOSIS — E1165 Type 2 diabetes mellitus with hyperglycemia: Secondary | ICD-10-CM

## 2020-08-04 MED ORDER — LIVALO 4 MG PO TABS: ORAL_TABLET | ORAL | 3 refills | Status: DC

## 2020-08-04 NOTE — Progress Notes (Signed)
Chronic Care Management Pharmacy Note  08/10/2020 Name:  Nicole Nunez MRN:  [id number] DOB:  02-27-1946  Subjective: Nicole Nunez is an 75 y.o. year old female who is a primary patient of Glendale Chard, MD.  The CCM team was consulted for assistance with disease management and care coordination needs.  Patient reports that she has not been walking as much due to making blankets for baby shower. She enjoys crocheting and is currently making a blanket  Engaged with patient by telephone for follow up visit in response to provider referral for pharmacy case management and/or care coordination services.   Consent to Services:  The patient was given information about Chronic Care Management services, agreed to services, and gave verbal consent prior to initiation of services.  Please see initial visit note for detailed documentation.   Patient Care Team: Glendale Chard, MD as PCP - General (Internal Medicine) Rex Kras Claudette Stapler, RN as Case Manager Mayford Knife, [facility] (Pharmacist)  Recent office visits: 06/02/2020 PCP OV   01/28/2021  PCP [facility] visits: None in previous 6 months  Objective:  Lab Results  Component Value Date   CREATININE 1.31 (H) 06/02/2020   BUN 21 06/02/2020   GFRNONAA 41 (L) 06/02/2020   GFRAA 47 (L) 06/02/2020   NA 137 06/02/2020   K 4.4 06/02/2020   CALCIUM 9.4 06/02/2020   CO2 24 06/02/2020    Lab Results  Component Value Date/Time   HGBA1C 6.6 (H) 06/02/2020 09:42 AM   HGBA1C 6.2 (H) 01/29/2020 02:42 PM   MICROALBUR 10 01/29/2020 12:51 PM   MICROALBUR 30 12/20/2018 12:33 PM    Last diabetic Eye exam: No results found for: HMDIABEYEEXA  Last diabetic Foot exam: No results found for: HMDIABFOOTEX   Lab Results  Component Value Date   CHOL 185 06/02/2020   HDL 64 06/02/2020   LDLCALC 108 (H) 06/02/2020   TRIG 68 06/02/2020   CHOLHDL 2.9 06/02/2020    Hepatic Function Latest Ref Rng & Units 06/02/2020 01/29/2020 07/30/2019  Total  Protein 6.0 - 8.5 g/dL 6.9 7.0 6.7  Albumin 3.7 - 4.7 g/dL 4.3 4.6 4.2  AST 0 - 40 IU/L _0 ALT 0 - 32 IU/L _1 Alk Phosphatase 44 - 121 IU/L 98 98 89  Total Bilirubin 0.0 - 1.2 mg/dL 0.5 0.5 0.4    No results found for: TSH, FREET4  CBC Latest Ref Rng & Units 01/29/2020 12/20/2018 11/11/2008  WBC 3.4 - 10.8 x10E3/uL 5.7 5.0 5.6  Hemoglobin 11.1 - 15.9 g/dL 11.1 11.2 11.7(L)  Hematocrit 34.0 - 46.6 % 34.0 34.8 34.7(L)  Platelets 150 - 450 x10E3/uL 321 311 270    Lab Results  Component Value Date/Time   VD25OH 65.4 01/29/2020 02:42 PM    Clinical ASCVD: No  The 10-year ASCVD risk score Mikey Bussing DC Jr., et al., 2013) is: 24.6%   Values used to calculate the score:     Age: 51 years     Sex: Female     Is Non-Hispanic African American: Yes     Diabetic: Yes     Tobacco smoker: No     Systolic Blood Pressure: 329 mmHg     Is BP treated: Yes     HDL Cholesterol: 64 mg/dL     Total Cholesterol: 185 mg/dL    Depression screen [facility] 2/9 01/29/2020 12/20/2018 08/27/2018  Decreased Interest 0 0 0  Down, Depressed, Hopeless 0 0 0  PHQ - 2 Score 0 0 0  Altered sleeping 0 0 -  Tired, decreased energy 0 0 -  Change in appetite 0 0 -  Feeling bad or failure about yourself  0 0 -  Trouble concentrating 0 0 -  Moving slowly or fidgety/restless 0 0 -  Suicidal thoughts 0 0 -  PHQ-9 Score 0 0 -  Difficult doing work/chores Not difficult at all - -     Social History   Tobacco Use  Smoking Status Never Smoker  Smokeless Tobacco Never Used   BP Readings from Last 3 Encounters:  06/02/20 118/76  01/29/20 136/70  01/29/20 136/70   Pulse Readings from Last 3 Encounters:  06/02/20 83  01/29/20 83  01/29/20 83   Wt Readings from Last 3 Encounters:  06/02/20 161 lb (73 kg)  01/29/20 158 lb 9.6 oz (71.9 kg)  01/29/20 158 lb 9.6 oz (71.9 kg)    Assessment/Interventions: Review of patient past medical history, allergies, medications, health status, including review of  consultants reports, laboratory and other test data, was performed as part of comprehensive evaluation and provision of chronic care management services.   SDOH:  (Social Determinants of Health) assessments and interventions performed: Yes   CCM Care Plan  No Known Allergies  Medications Reviewed Today    Reviewed by Mayford Knife, RPH (Pharmacist) on 08/04/20 at 0856  Med List Status: <None>  Medication Order Taking? Sig Documenting Provider Last Dose Status Informant  aspirin EC 81 MG tablet [id number] Yes Take 81 mg by mouth daily. [provider] Taking Active   b complex vitamins tablet [id number] Yes Take 1 tablet by mouth daily. [provider] Taking Active Self  Calcium Carbonate (CALCIUM 600 PO) [id number] Yes Take 1 tablet by mouth daily.  [provider] Taking Active   Cholecalciferol (VITAMIN D3) 125 MCG (5000 UT) CAPS [id number] Yes Take 1 capsule by mouth daily.  [provider] Taking Active   Coenzyme Q10 100 MG TABS [id number] Yes Take 1 tablet by mouth daily. [provider] Taking Active   Lancet Devices (ONE TOUCH DELICA LANCING DEV) MISC [id number] Yes USE AS DIRECTED TO CHECK BLOOD SUGARS ONCE DAILY dx: e11.22 Glendale Chard, MD Taking Active   Lancets (ONETOUCH DELICA PLUS XIHWTU88K) [place] [id number] Yes USE AS DIRECTED TO CHECK BLOOD SUGARS ONCE DAILY Glendale Chard, MD Taking Active   magnesium gluconate (MAGONATE) 500 MG tablet [id number] Yes Take 500 mg by mouth daily. [provider] Taking Active Self  Multiple Vitamin (MULTIVITAMIN) tablet [id number] Yes Take 1 tablet by mouth daily. [provider] Taking Active Self  olmesartan (BENICAR) 20 MG tablet [id number] Yes TAKE 1 TABLET BY MOUTH  DAILY Glendale Chard, MD Taking Active   [facility] VERIO test strip [id number] Yes USE AS DIRECTED TO CHECK  BLOOD SUGAR ONCE DAILY Glendale Chard, MD Taking Active   pantoprazole (PROTONIX) 40 MG tablet [id number] Yes TAKE  1 TABLET(40 MG) BY MOUTH DAILY Glendale Chard, MD Taking Active   pioglitazone-metformin (ACTOPLUS MET) 15-500 MG tablet [id number] Yes TAKE 1 TABLET BY MOUTH  TWICE DAILY Glendale Chard, MD Taking Active   Pitavastatin Calcium 2 MG TABS [id number] Yes Take 1 tablet by mouth daily. Monday, Wednesday, and Friday [provider] Taking Active   Semaglutide,0.25 or 0.5MG/DOS, (OZEMPIC, 0.25 OR 0.5 MG/DOSE,) 2 MG/1.5ML Bon[place] Aid [id number] Yes INJECT SUBCUTANEOUSLY 0.5MG ONCE A WEEK ON Alfredo Bach, MD Taking Active           Patient  Active Problem List   Diagnosis Date Noted  . Notalgia 01/29/2020  . Vitamin D deficiency disease 01/29/2020  . Class 1 obesity due to excess calories with serious comorbidity and body mass index (BMI) of 32.0 to 32.9 in adult 01/29/2020  . Type 2 diabetes mellitus with stage 3 chronic kidney disease, without long-term current use of insulin ([place]) 05/24/2018  . Chronic renal disease, stage III ([place]) 05/24/2018  . Parenchymal renal hypertension 05/24/2018  . Pure hypercholesterolemia 05/24/2018  . Class 1 obesity due to excess calories with serious comorbidity and body mass index (BMI) of 31.0 to 31.9 in adult 05/24/2018    Immunization History  Administered Date(s) Administered  . DTaP 06/17/2011  . Fluad Quad(high Dose 65+) 03/19/2019, 01/29/2020  . Influenza-Unspecified 02/06/2018  . PFIZER(Purple Top)SARS-COV-2 Vaccination 07/08/2019, 07/30/2019, 05/10/2020  . Pneumococcal Conjugate-13 05/07/2014  . Pneumococcal Polysaccharide-23 02/28/2013  . Pneumococcal-Unspecified 02/25/2013, 05/07/2015  . Zoster 04/30/2013    Conditions to be addressed/monitored:  Hypertension, Hyperlipidemia and Diabetes  Care Plan : [place]  Updates made by Mayford Knife, RPH since 08/10/2020 12:00 AM    Problem: HTN, HLD, DM, GERD   Priority: High    Long-Range Goal: Disease Management   This Visit's Progress: On track  Priority: High   Note:    Current Barriers:  . Unable to independently afford treatment regimen . Unable to achieve control of Cholesterol.     Pharmacist Clinical Goal(s):  Marland Kitchen Patient will achieve adherence to monitoring guidelines and medication adherence to achieve therapeutic efficacy . achieve control of cholesterol  as evidenced by decrease in LDL through collaboration with PharmD and provider.  Interventions: . 1:1 collaboration with Glendale Chard, MD regarding development and update of comprehensive plan of care as evidenced by provider attestation and co-signature . Inter-disciplinary care team collaboration (see longitudinal plan of care) . Comprehensive medication review performed; medication list updated in electronic medical record  Hypertension (BP goal <130/80) -Controlled -Current treatment: . Olmesartan 20 mg take 1 tablet by mouth daily.  -Current home readings: 130/70  -Current dietary habits: avoiding salt  -Current exercise habits: the weather has decreased the amount of time she walks outside. She walks in the store -Denies hypotensive/hypertensive symptoms -Educated on Daily salt intake goal < 2300 mg; Exercise goal of 150 minutes per week; Importance of home blood pressure monitoring; -Counseled to monitor BP at home at least once per day, document, and provide log at future appointments -Counseled on diet and exercise extensively Recommended to continue current medication  Hyperlipidemia: (LDL goal < 70) -Uncontrolled -Current treatment: . Pitavastatin 2 mg - take 1 tablet by mouth daily  o Patient to increase to 2 tablets on Monday, Wednesday, Friday  . Aspirin 81 mg - take 1 tablet by mouth daily  -Current dietary patterns: Patient reports that she is not eating as much fried and fatty foods, she reports that she is eating more vegetables including brussel sprouts and cabbage. Some days she does not eat a lot of meat at all.  -Current exercise habits: patient reports  that she is not exercising as often as she used to.  -Educated on Cholesterol goals;  Importance of limiting foods high in cholesterol; Exercise goal of 150 minutes per week; -Counseled on diet and exercise extensively Recommended to continue current medication  Collaborated with PCP and patient to complete patient assistance paperwork for Livalo  through the [facility] application form.  Assessed patient finances. Patient should be a candidate for patient assistance  program.   Diabetes (A1c goal <7%) -Controlled -Current medications: . Ozempic 0.5 mg once a week on Thursday.  o Patient has been approved for patient assistance.  o She received the first shipment in February  . Pioglitazone-metformin 15-500 mg - take 1 tablet by mouth twice daily  -Medications previously tried: Victoza -Current home glucose readings . fasting glucose: [phone] -Denies hypoglycemic/hyperglycemic symptoms -Current meal patterns:  . breakfast: oatmeal, cream of wheat, or bacon, eggs and grits . dinner: vegetables, with meat and starch  . snacks:  . drinks: increasing the amount of water intake  -Current exercise: walking around the stores  -Educated on A1c and blood sugar goals; Exercise goal of 150 minutes per week; Benefits of weight loss; -Counseled to check feet daily and get yearly eye exams -Recommended to continue current medication  *Patient would like more samples of Linzess that worked very well and samples of Pitavastatin 2 mg   GERD  (Goal: signs and symptoms of acid reful) -Not ideally controlled -Current treatment  . Pantoprazole 40 mg tablet on  Monday, Wednesday and Friday . Dexilant 60 mg -take 1 capsule by mouth daily.  o Patient reports that medication was too expensive for her to pick up after she received samples.  - Will try to help patient with patient assistance.   -Recommended to continue current medication Collaborated with PCP and patient to complete patient assistance  paperwork for Dexilant through the [employer].  Assessed patient finances. Patient should be a candidate for patient assistance program.    Patient Goals/Self-Care Activities . Patient will:  - take medications as prescribed check glucose  at least once per day, document, and provide at future appointments target a minimum of 150 minutes of moderate intensity exercise weekly  Follow Up Plan: Telephone follow up appointment with care management team member scheduled for: 09/03/2020 The patient has been provided with contact information for the care management team and has been advised to call with any health related questions or concerns.       Medication Assistance: Application for Livalo 4 mg   medication assistance program. in process.  Anticipated assistance start date 09/04/2020.  See plan of care for additional detail. Application of Dexilant 60 mg capsule once per day also requested.   *Spoke with patient to confirm that she could pick up samples of medication, and to fill out forms.   Patient's preferred pharmacy is:  [facility], [address], [address] [place], [place] [address] Phone: [phone] Fax: [place], [place] [address] Phone: [phone] Fax: [phone]  Uses pill box? Yes Pt endorses 90% compliance  We discussed: Benefits of medication synchronization, packaging and delivery as well as enhanced pharmacist oversight with [employer]. Patient decided to: Continue current medication management strategy  Care Plan and Follow Up Patient Decision:  Patient agrees to Care Plan and Follow-up.  Plan: Telephone follow up appointment with care management team member scheduled for:  09/03/2020 and The patient has been provided with contact information for the care management team  and has been advised to call with any health related questions or concerns.   Orlando Penner, PharmD Clinical Pharmacist [facility] [phone]

## 2020-08-10 NOTE — Patient Instructions (Signed)
Visit Information It was great speaking with you today!  Please let me know if you have any questions about our visit.  Goals Addressed            This Visit's Progress   . Manage My Medicine       Timeframe:  Long-Range Goal Priority:  High Start Date:                             Expected End Date:                       Follow Up Date 09/03/2020   - call for medicine refill 2 or 3 days before it runs out - call if I am sick and can't take my medicine - learn to read medicine labels - use a pillbox to sort medicine    Why is this important?   . These steps will help you keep on track with your medicines.          Patient Care Plan: CCM Pharmacy Care Plan    Problem Identified: HTN, HLD, DM, GERD   Priority: High    Long-Range Goal: Disease Management   This Visit's Progress: On track  Priority: High  Note:    Current Barriers:  . Unable to independently afford treatment regimen . Unable to achieve control of Cholesterol.     Pharmacist Clinical Goal(s):  Marland Kitchen Patient will achieve adherence to monitoring guidelines and medication adherence to achieve therapeutic efficacy . achieve control of cholesterol  as evidenced by decrease in LDL through collaboration with PharmD and provider.  Interventions: . 1:1 collaboration with Glendale Chard, MD regarding development and update of comprehensive plan of care as evidenced by provider attestation and co-signature . Inter-disciplinary care team collaboration (see longitudinal plan of care) . Comprehensive medication review performed; medication list updated in electronic medical record  Hypertension (BP goal <130/80) -Controlled -Current treatment: . Olmesartan 20 mg take 1 tablet by mouth daily.  -Current home readings: 130/70  -Current dietary habits: avoiding salt  -Current exercise habits: the weather has decreased the amount of time she walks outside. She walks in the store -Denies hypotensive/hypertensive  symptoms -Educated on Daily salt intake goal < 2300 mg; Exercise goal of 150 minutes per week; Importance of home blood pressure monitoring; -Counseled to monitor BP at home at least once per day, document, and provide log at future appointments -Counseled on diet and exercise extensively Recommended to continue current medication  Hyperlipidemia: (LDL goal < 70) -Uncontrolled -Current treatment: . Pitavastatin 2 mg - take 1 tablet by mouth daily  o Patient to increase to 2 tablets on Monday, Wednesday, Friday  . Aspirin 81 mg - take 1 tablet by mouth daily  -Current dietary patterns: Patient reports that she is not eating as much fried and fatty foods, she reports that she is eating more vegetables including brussel sprouts and cabbage. Some days she does not eat a lot of meat at all.  -Current exercise habits: patient reports that she is not exercising as often as she used to.  -Educated on Cholesterol goals;  Importance of limiting foods high in cholesterol; Exercise goal of 150 minutes per week; -Counseled on diet and exercise extensively Recommended to continue current medication  Collaborated with PCP and patient to complete patient assistance paperwork for Livalo  through the [facility] application form.  Assessed patient finances. Patient should be a candidate for  patient assistance program.   Diabetes (A1c goal <7%) -Controlled -Current medications: . Ozempic 0.5 mg once a week on Thursday.  o Patient has been approved for patient assistance.  o She received the first shipment in February  . Pioglitazone-metformin 15-500 mg - take 1 tablet by mouth twice daily  -Medications previously tried: Victoza -Current home glucose readings . fasting glucose: [phone] -Denies hypoglycemic/hyperglycemic symptoms -Current meal patterns:  . breakfast: oatmeal, cream of wheat, or bacon, eggs and grits . dinner: vegetables, with meat and starch  . snacks:  . drinks: increasing the amount of  water intake  -Current exercise: walking around the stores  -Educated on A1c and blood sugar goals; Exercise goal of 150 minutes per week; Benefits of weight loss; -Counseled to check feet daily and get yearly eye exams -Recommended to continue current medication  *Patient would like more samples of Linzess that worked very well and samples of Pitavastatin 2 mg   GERD  (Goal: signs and symptoms of acid reful) -Not ideally controlled -Current treatment  . Pantoprazole 40 mg tablet on  Monday, Wednesday and Friday . Dexilant 60 mg -take 1 capsule by mouth daily.  o Patient reports that medication was too expensive for her to pick up after she received samples.  - Will try to help patient with patient assistance.   -Recommended to continue current medication Collaborated with PCP and patient to complete patient assistance paperwork for Dexilant through the [employer].  Assessed patient finances. Patient should be a candidate for patient assistance program.    Patient Goals/Self-Care Activities . Patient will:  - take medications as prescribed check glucose  at least once per day, document, and provide at future appointments target a minimum of 150 minutes of moderate intensity exercise weekly  Follow Up Plan: Telephone follow up appointment with care management team member scheduled for: 09/03/2020 The patient has been provided with contact information for the care management team and has been advised to call with any health related questions or concerns.       Patient agreed to services and verbal consent obtained.   The patient verbalized understanding of instructions, educational materials, and care plan provided today and agreed to receive a mailed copy of patient instructions, educational materials, and care plan.   Orlando Penner, PharmD Clinical Pharmacist [facility] [phone]

## 2020-08-18 ENCOUNTER — Telehealth: Payer: Self-pay

## 2020-08-18 NOTE — Chronic Care Management (AMB) (Signed)
08/18/2020- Called [employer] helping hand regarding Dexilant patient assistance medication, information missing. Spoke with Benjamine Mola and she was able to inform me that the patient signature dates are future, patient placed 09/06/2020 instead of 08/06/2020 and they also need a copy of her insurance card. Will fix documentation and fax back with copy of insurance card. Orlando Penner notified.   [name], [place] Pharmacist Assistant [phone]

## 2020-08-25 NOTE — Telephone Encounter (Cosign Needed)
Reviewed faxes received by Bernita Buffy on 08/20/2020 for patient assistance for Dexilant. States patients application is still not complete. Per representative Randall Hiss, patients submission is missing the medicare ID#, reviewed entire application and confirmed 4 submissions were done. Will resend this information today to expedite the process of this request. Representative reports that after the application is complete it will take two business days to resume submission. The determination should be reached in about two weeks as an estimate, will confirm with Ms. Fick that she has medication on hand. .  Total Time: 40 minutes

## 2020-09-02 ENCOUNTER — Telehealth: Payer: Self-pay

## 2020-09-02 NOTE — Progress Notes (Signed)
09/02/20-Attempted to outreach the patient to remind her of CCM Call appointment with Orlando Penner, CPP on 09/03/20 at 8:30 AM. No answer, left a message with appointment date and time. Informed the patient in message to have medications and supplements near during phone visit.  Orlando Penner, CPP Notified.  Raynelle Highland, [place] Pharmacist Assistant [phone] CCM Total Time: 9 minutes

## 2020-09-03 ENCOUNTER — Telehealth: Payer: Self-pay

## 2020-09-03 NOTE — Progress Notes (Incomplete)
Chronic Care Management Pharmacy Note  09/03/2020 Name:  Nicole Nunez MRN:  [id number] DOB:  27-Oct-1945  Subjective: Nicole Nunez is an 75 y.o. year old female who is a primary patient of Glendale Chard, MD.  The CCM team was consulted for assistance with disease management and care coordination needs.    {CCMTELEPHONEFACETOFACE:21091510} for {CCMINITIALFOLLOWUPCHOICE:21091511} in response to provider referral for pharmacy case management and/or care coordination services.   Consent to Services:  {CCMCONSENTOPTIONS:25074}  Patient Care Team: Glendale Chard, MD as PCP - General (Internal Medicine) Rex Kras, Claudette Stapler, RN as Case Manager Mayford Knife, [facility] (Pharmacist)  Recent office visits: ***  Recent consult visits: [facility] visits: {Hospital DC Yes/No:25215}  Objective:  Lab Results  Component Value Date   CREATININE 1.31 (H) 06/02/2020   BUN 21 06/02/2020   GFRNONAA 41 (L) 06/02/2020   GFRAA 47 (L) 06/02/2020   NA 137 06/02/2020   K 4.4 06/02/2020   CALCIUM 9.4 06/02/2020   CO2 24 06/02/2020   GLUCOSE 113 (H) 06/02/2020    Lab Results  Component Value Date/Time   HGBA1C 6.6 (H) 06/02/2020 09:42 AM   HGBA1C 6.2 (H) 01/29/2020 02:42 PM   MICROALBUR 10 01/29/2020 12:51 PM   MICROALBUR 30 12/20/2018 12:33 PM    Last diabetic Eye exam: No results found for: HMDIABEYEEXA  Last diabetic Foot exam: No results found for: HMDIABFOOTEX   Lab Results  Component Value Date   CHOL 185 06/02/2020   HDL 64 06/02/2020   LDLCALC 108 (H) 06/02/2020   TRIG 68 06/02/2020   CHOLHDL 2.9 06/02/2020    Hepatic Function Latest Ref Rng & Units 06/02/2020 01/29/2020 07/30/2019  Total Protein 6.0 - 8.5 g/dL 6.9 7.0 6.7  Albumin 3.7 - 4.7 g/dL 4.3 4.6 4.2  AST 0 - 40 IU/L '20 22 22  ' ALT 0 - 32 IU/L '15 15 10  ' Alk Phosphatase 44 - 121 IU/L 98 98 89  Total Bilirubin 0.0 - 1.2 mg/dL 0.5 0.5 0.4    No results found for: TSH, FREET4  CBC Latest Ref Rng & Units  01/29/2020 12/20/2018 11/11/2008  WBC 3.4 - 10.8 x10E3/uL 5.7 5.0 5.6  Hemoglobin 11.1 - 15.9 g/dL 11.1 11.2 11.7(L)  Hematocrit 34.0 - 46.6 % 34.0 34.8 34.7(L)  Platelets 150 - 450 x10E3/uL 321 311 270    Lab Results  Component Value Date/Time   VD25OH 65.4 01/29/2020 02:42 PM    Clinical ASCVD: {YES/NO:21197} The 10-year ASCVD risk score Mikey Bussing DC Jr., et al., 2013) is: 24.6%   Values used to calculate the score:     Age: 59 years     Sex: Female     Is Non-Hispanic African American: Yes     Diabetic: Yes     Tobacco smoker: No     Systolic Blood Pressure: 937 mmHg     Is BP treated: Yes     HDL Cholesterol: 64 mg/dL     Total Cholesterol: 185 mg/dL    Depression screen [facility] 2/9 01/29/2020 12/20/2018 08/27/2018  Decreased Interest 0 0 0  Down, Depressed, Hopeless 0 0 0  PHQ - 2 Score 0 0 0  Altered sleeping 0 0 -  Tired, decreased energy 0 0 -  Change in appetite 0 0 -  Feeling bad or failure about yourself  0 0 -  Trouble concentrating 0 0 -  Moving slowly or fidgety/restless 0 0 -  Suicidal thoughts 0 0 -  PHQ-9 Score 0 0 -  Difficult doing work/chores Not difficult at all - -     ***Other: (CHADS2VASc if Afib, MMRC or CAT for COPD, ACT, DEXA)  Social History   Tobacco Use  Smoking Status Never Smoker  Smokeless Tobacco Never Used   BP Readings from Last 3 Encounters:  06/02/20 118/76  01/29/20 136/70  01/29/20 136/70   Pulse Readings from Last 3 Encounters:  06/02/20 83  01/29/20 83  01/29/20 83   Wt Readings from Last 3 Encounters:  06/02/20 161 lb (73 kg)  01/29/20 158 lb 9.6 oz (71.9 kg)  01/29/20 158 lb 9.6 oz (71.9 kg)   BMI Readings from Last 3 Encounters:  06/02/20 33.19 kg/m  01/29/20 32.69 kg/m  01/29/20 32.69 kg/m    Assessment/Interventions: Review of patient past medical history, allergies, medications, health status, including review of consultants reports, laboratory and other test data, was performed as part of comprehensive evaluation  and provision of chronic care management services.   SDOH:  (Social Determinants of Health) assessments and interventions performed: {yes/no:20286}  SDOH Screenings   Alcohol Screen: Not on file  Depression (PHQ2-9): Low Risk   . PHQ-2 Score: 0  Financial Resource Strain: Low Risk   . Difficulty of Paying Living Expenses: Not hard at all  Food Insecurity: No Food Insecurity  . Worried About Charity fundraiser in the Last Year: Never true  . Ran Out of Food in the Last Year: Never true  Housing: Not on file  Physical Activity: Inactive  . Days of Exercise per Week: 0 days  . Minutes of Exercise per Session: 0 min  Social Connections: Unknown  . Frequency of Communication with Friends and Family: More than three times a week  . Frequency of Social Gatherings with Friends and Family: More than three times a week  . Attends Religious Services: More than 4 times per year  . Active Member of Clubs or Organizations: Yes  . Attends Archivist Meetings: More than 4 times per year  . Marital Status: Not on file  Stress: No Stress Concern Present  . Feeling of Stress : Not at all  Tobacco Use: Low Risk   . Smoking Tobacco Use: Never Smoker  . Smokeless Tobacco Use: Never Used  Transportation Needs: No Transportation Needs  . Lack of Transportation (Medical): No  . Lack of Transportation (Non-Medical): No    CCM Care Plan  No Known Allergies  Medications Reviewed Today    Reviewed by Mayford Knife, [facility] (Pharmacist) on 08/04/20 at [place]  Med List Status: <None>  Medication Order Taking? Sig Documenting Provider Last Dose Status Informant  aspirin EC 81 MG tablet [id number] Yes Take 81 mg by mouth daily. [provider] Taking Active   b complex vitamins tablet [id number] Yes Take 1 tablet by mouth daily. [provider] Taking Active Self  Calcium Carbonate (CALCIUM 600 PO) [id number] Yes Take 1 tablet by mouth daily.  [provider] Taking Active    Cholecalciferol (VITAMIN D3) 125 MCG (5000 UT) CAPS [id number] Yes Take 1 capsule by mouth daily.  [provider] Taking Active   Coenzyme Q10 100 MG TABS [id number] Yes Take 1 tablet by mouth daily. [provider] Taking Active   Lancet Devices (ONE TOUCH DELICA LANCING DEV) MISC [id number] Yes USE AS DIRECTED TO CHECK BLOOD SUGARS ONCE DAILY dx: e11.22 Glendale Chard, MD Taking Active   Lancets (ONETOUCH DELICA PLUS MQKMMN81R) [place] [id number] Yes USE AS DIRECTED TO CHECK BLOOD SUGARS ONCE DAILY  Glendale Chard, MD Taking Active   magnesium gluconate (MAGONATE) 500 MG tablet [id number] Yes Take 500 mg by mouth daily. [provider] Taking Active Self  Multiple Vitamin (MULTIVITAMIN) tablet [id number] Yes Take 1 tablet by mouth daily. [provider] Taking Active Self  olmesartan (BENICAR) 20 MG tablet [id number] Yes TAKE 1 TABLET BY MOUTH  DAILY Glendale Chard, MD Taking Active   [facility] VERIO test strip [id number] Yes USE AS DIRECTED TO CHECK  BLOOD SUGAR ONCE DAILY Glendale Chard, MD Taking Active   pantoprazole (PROTONIX) 40 MG tablet [id number] Yes TAKE 1 TABLET(40 MG) BY MOUTH DAILY Glendale Chard, MD Taking Active   pioglitazone-metformin (ACTOPLUS MET) 15-500 MG tablet [id number] Yes TAKE 1 TABLET BY MOUTH  TWICE DAILY Glendale Chard, MD Taking Active   Pitavastatin Calcium 2 MG TABS [id number] Yes Take 1 tablet by mouth daily. Monday, Wednesday, and Friday [provider] Taking Active   Semaglutide,0.25 or 0.5MG/DOS, (OZEMPIC, 0.25 OR 0.5 MG/DOSE,) 2 MG/1.5ML Bon[place] Aid [id number] Yes INJECT SUBCUTANEOUSLY 0.5MG ONCE A WEEK ON Alfredo Bach, MD Taking Active           Patient Active Problem List   Diagnosis Date Noted  . Notalgia 01/29/2020  . Vitamin D deficiency disease 01/29/2020  . Class 1 obesity due to excess calories with serious comorbidity and body mass index (BMI) of 32.0 to 32.9 in adult 01/29/2020  . Type 2 diabetes mellitus with  stage 3 chronic kidney disease, without long-term current use of insulin ([place]) 05/24/2018  . Chronic renal disease, stage III ([place]) 05/24/2018  . Parenchymal renal hypertension 05/24/2018  . Pure hypercholesterolemia 05/24/2018  . Class 1 obesity due to excess calories with serious comorbidity and body mass index (BMI) of 31.0 to 31.9 in adult 05/24/2018    Immunization History  Administered Date(s) Administered  . DTaP 06/17/2011  . Fluad Quad(high Dose 65+) 03/19/2019, 01/29/2020  . Influenza-Unspecified 02/06/2018  . PFIZER(Purple Top)SARS-COV-2 Vaccination 07/08/2019, 07/30/2019, 05/10/2020  . Pneumococcal Conjugate-13 05/07/2014  . Pneumococcal Polysaccharide-23 02/28/2013  . Pneumococcal-Unspecified 02/25/2013, 05/07/2015  . Zoster 04/30/2013    Conditions to be addressed/monitored:  {USCCMDZASSESSMENTOPTIONS:23563}  There are no care plans that you recently modified to display for this patient.    Medication Assistance: {MEDASSISTANCEINFO:25044}  Patient's preferred pharmacy is:  [facility] [address], [address] [place], [place] [address] Phone: [phone] Fax: [place], [address] [address] Phone: [phone] Fax: [phone]  Uses pill box? {Yes or If no, why not?:20788} Pt endorses ***% compliance  We discussed: {Pharmacy options:24294} Patient decided to: {US Pharmacy Plan:23885}  Care Plan and Follow Up Patient Decision:  {FOLLOWUP:24991}  Plan: {CM FOLLOW UP PLAN:25073}  ***   Current Barriers:  . {pharmacybarriers:24917} . ***  Pharmacist Clinical Goal(s):  Marland Kitchen Patient will {PHARMACYGOALCHOICES:24921} through collaboration with PharmD and provider.  . ***  Interventions: . 1:1 collaboration with Glendale Chard, MD regarding development and update of  comprehensive plan of care as evidenced by provider attestation and co-signature . Inter-disciplinary care team collaboration (see longitudinal plan of care) . Comprehensive medication review performed; medication list updated in electronic medical record  {CCM PHARMD DISEASE STATES:25130}  Patient Goals/Self-Care Activities . Patient will:  - {pharmacypatientgoals:24919}  Follow Up Plan: {CM FOLLOW UP OYDX:41287}

## 2020-09-09 NOTE — Telephone Encounter (Signed)
Informed patient that her medication is ready for pick - up: Ozempic through patient assistance, also rescheduled patients appointment for May to the morning per her request. She reports missing my visit due to her walking schedule, I congratulated on her walking.   Orlando Penner, PharmD Clinical Pharmacist [facility] [phone]  Total Time: 17 minutes

## 2020-09-23 ENCOUNTER — Telehealth: Payer: Self-pay

## 2020-09-23 NOTE — Chronic Care Management (AMB) (Signed)
Left patient an appointment reminder with Orlando Penner, [place]  on 09-23-2020 @ 8am. Instructed patient to have all meds/supplements and logs available for review and if unable to make appointment please call back to reschedule.  [place]  [phone]

## 2020-09-24 ENCOUNTER — Ambulatory Visit (INDEPENDENT_AMBULATORY_CARE_PROVIDER_SITE_OTHER): Payer: Medicare Other

## 2020-09-24 DIAGNOSIS — E7800 Pure hypercholesterolemia, unspecified: Secondary | ICD-10-CM

## 2020-09-24 DIAGNOSIS — E1165 Type 2 diabetes mellitus with hyperglycemia: Secondary | ICD-10-CM

## 2020-09-24 NOTE — Progress Notes (Signed)
Chronic Care Management Pharmacy Note  10/08/2020 Name:  MARJON DOXTATER MRN:  [id number] DOB:  06-Feb-1946  Subjective: KALLE BERNATH is an 75 y.o. year old female who is a primary patient of Glendale Chard, MD.  The CCM team was consulted for assistance with disease management and care coordination needs.  Patient reports that she is happy to be her age and be healthy. She retired from the [employer]. She enjoys playing pickle ball but she is concerned about the virus.   Engaged with patient by telephone for follow up visit in response to provider referral for pharmacy case management and/or care coordination services.   Consent to Services:  The patient was given information about Chronic Care Management services, agreed to services, and gave verbal consent prior to initiation of services.  Please see initial visit note for detailed documentation.   Patient Care Team: Glendale Chard, MD as PCP - General (Internal Medicine) Rex Kras Claudette Stapler, RN as Case Manager Mayford Knife, [facility] (Pharmacist)  Recent office visits: 06/02/2020 PCP Office Visit   Recent consult visits: 07/30/2020 Opthamology OV  04/21/2020 [facility] visits: None in previous 6 months  Objective:  Lab Results  Component Value Date   CREATININE 1.26 (H) 09/30/2020   BUN 18 09/30/2020   GFRNONAA 41 (L) 06/02/2020   GFRAA 47 (L) 06/02/2020   NA 143 09/30/2020   K 4.5 09/30/2020   CALCIUM 9.6 09/30/2020   CO2 26 09/30/2020   GLUCOSE 108 (H) 09/30/2020    Lab Results  Component Value Date/Time   HGBA1C 6.3 (H) 09/30/2020 09:18 AM   HGBA1C 6.6 (H) 06/02/2020 09:42 AM   MICROALBUR 10 01/29/2020 12:51 PM   MICROALBUR 30 12/20/2018 12:33 PM    Last diabetic Eye exam: No results found for: HMDIABEYEEXA  Last diabetic Foot exam: No results found for: HMDIABFOOTEX   Lab Results  Component Value Date   CHOL 185 06/02/2020   HDL 64 06/02/2020   LDLCALC 108 (H) 06/02/2020    TRIG 68 06/02/2020   CHOLHDL 2.9 06/02/2020    Hepatic Function Latest Ref Rng & Units 06/02/2020 01/29/2020 07/30/2019  Total Protein 6.0 - 8.5 g/dL 6.9 7.0 6.7  Albumin 3.7 - 4.7 g/dL 4.3 4.6 4.2  AST 0 - 40 IU/L '20 22 22  ' ALT 0 - 32 IU/L '15 15 10  ' Alk Phosphatase 44 - 121 IU/L 98 98 89  Total Bilirubin 0.0 - 1.2 mg/dL 0.5 0.5 0.4    No results found for: TSH, FREET4  CBC Latest Ref Rng & Units 01/29/2020 12/20/2018 11/11/2008  WBC 3.4 - 10.8 x10E3/uL 5.7 5.0 5.6  Hemoglobin 11.1 - 15.9 g/dL 11.1 11.2 11.7(L)  Hematocrit 34.0 - 46.6 % 34.0 34.8 34.7(L)  Platelets 150 - 450 x10E3/uL 321 311 270    Lab Results  Component Value Date/Time   VD25OH 65.4 01/29/2020 02:42 PM    Clinical ASCVD: No  The 10-year ASCVD risk score Mikey Bussing DC Jr., et al., 2013) is: 29.2%   Values used to calculate the score:     Age: 53 years     Sex: Female     Is Non-Hispanic African American: Yes     Diabetic: Yes     Tobacco smoker: No     Systolic Blood Pressure: 354 mmHg     Is BP treated: Yes     HDL Cholesterol: 64 mg/dL     Total Cholesterol: 185 mg/dL    Depression screen PHQ  2/9 01/29/2020 12/20/2018 08/27/2018  Decreased Interest 0 0 0  Down, Depressed, Hopeless 0 0 0  PHQ - 2 Score 0 0 0  Altered sleeping 0 0 -  Tired, decreased energy 0 0 -  Change in appetite 0 0 -  Feeling bad or failure about yourself  0 0 -  Trouble concentrating 0 0 -  Moving slowly or fidgety/restless 0 0 -  Suicidal thoughts 0 0 -  PHQ-9 Score 0 0 -  Difficult doing work/chores Not difficult at all - -     Social History   Tobacco Use  Smoking Status Never Smoker  Smokeless Tobacco Never Used   BP Readings from Last 3 Encounters:  09/30/20 132/70  06/02/20 118/76  01/29/20 136/70   Pulse Readings from Last 3 Encounters:  09/30/20 82  06/02/20 83  01/29/20 83   Wt Readings from Last 3 Encounters:  09/30/20 158 lb 12.8 oz (72 kg)  06/02/20 161 lb (73 kg)  01/29/20 158 lb 9.6 oz (71.9 kg)   BMI  Readings from Last 3 Encounters:  09/30/20 33.19 kg/m  06/02/20 33.19 kg/m  01/29/20 32.69 kg/m    Assessment/Interventions: Review of patient past medical history, allergies, medications, health status, including review of consultants reports, laboratory and other test data, was performed as part of comprehensive evaluation and provision of chronic care management services.   SDOH:  (Social Determinants of Health) assessments and interventions performed: No  SDOH Screenings   Alcohol Screen: Not on file  Depression (PHQ2-9): Low Risk   . PHQ-2 Score: 0  Financial Resource Strain: Low Risk   . Difficulty of Paying Living Expenses: Not hard at all  Food Insecurity: No Food Insecurity  . Worried About Charity fundraiser in the Last Year: Never true  . Ran Out of Food in the Last Year: Never true  Housing: Not on file  Physical Activity: Sufficiently Active  . Days of Exercise per Week: 4 days  . Minutes of Exercise per Session: 60 min  Social Connections: Unknown  . Frequency of Communication with Friends and Family: More than three times a week  . Frequency of Social Gatherings with Friends and Family: More than three times a week  . Attends Religious Services: More than 4 times per year  . Active Member of Clubs or Organizations: Yes  . Attends Archivist Meetings: More than 4 times per year  . Marital Status: Not on file  Stress: No Stress Concern Present  . Feeling of Stress : Not at all  Tobacco Use: Low Risk   . Smoking Tobacco Use: Never Smoker  . Smokeless Tobacco Use: Never Used  Transportation Needs: No Transportation Needs  . Lack of Transportation (Medical): No  . Lack of Transportation (Non-Medical): No    CCM Care Plan  No Known Allergies  Medications Reviewed Today    Reviewed by Glendale Chard, MD (Physician) on 09/30/20 at 0903  Med List Status: <None>  Medication Order Taking? Sig Documenting Provider Last Dose Status Informant  aspirin EC  81 MG tablet [id number] Yes Take 81 mg by mouth daily. [provider] Taking Active   b complex vitamins tablet [id number] Yes Take 1 tablet by mouth daily. [provider] Taking Active Self  Calcium Carbonate (CALCIUM 600 PO) [id number] Yes Take 1 tablet by mouth daily.  [provider] Taking Active   Cholecalciferol (VITAMIN D3) 125 MCG (5000 UT) CAPS [id number] Yes Take 1 capsule by mouth daily.  [provider] Taking Active   Coenzyme Q10 100 MG TABS [id number] Yes Take 1 tablet by mouth daily. [provider] Taking Active   Lancet Devices (ONE TOUCH DELICA LANCING DEV) MISC [id number] Yes USE AS DIRECTED TO CHECK BLOOD SUGARS ONCE DAILY dx: e11.22 Glendale Chard, MD Taking Active   Lancets (ONETOUCH DELICA PLUS QRFXJO83G) [place] [id number] Yes USE AS DIRECTED TO CHECK BLOOD SUGARS ONCE DAILY Glendale Chard, MD Taking Active   magnesium gluconate (MAGONATE) 500 MG tablet [id number] Yes Take 500 mg by mouth daily. [provider] Taking Active Self  Multiple Vitamin (MULTIVITAMIN) tablet [id number] Yes Take 1 tablet by mouth daily. [provider] Taking Active Self  olmesartan (BENICAR) 20 MG tablet [id number] Yes TAKE 1 TABLET BY MOUTH  DAILY Glendale Chard, MD Taking Active   [facility] VERIO test strip [id number] Yes USE AS DIRECTED TO CHECK  BLOOD SUGAR ONCE DAILY Glendale Chard, MD Taking Active   pantoprazole (PROTONIX) 40 MG tablet [id number] Yes TAKE 1 TABLET(40 MG) BY MOUTH DAILY Glendale Chard, MD Taking Active   pioglitazone-metformin (ACTOPLUS MET) 15-500 MG tablet [id number] Yes TAKE 1 TABLET BY MOUTH  TWICE DAILY Glendale Chard, MD Taking Active   Pitavastatin Calcium (LIVALO) 4 MG TABS [id number] Yes Take one tablet by mouth on Monday Wednesday AND Friday Glendale Chard, MD Taking Active   Semaglutide,0.25 or 0.5MG/DOS, (OZEMPIC, 0.25 OR 0.5 MG/DOSE,) 2 MG/1.5ML Bon[place] Aid [id number] Yes INJECT SUBCUTANEOUSLY 0.5MG ONCE A WEEK ON Alfredo Bach, MD Taking Active           Patient Active Problem List   Diagnosis Date Noted  . Notalgia 01/29/2020  . Vitamin D deficiency disease 01/29/2020  . Class 1 obesity due to excess calories with serious comorbidity and body mass index (BMI) of 32.0 to 32.9 in adult 01/29/2020  . Type 2 diabetes mellitus with stage 3 chronic kidney disease, without long-term current use of insulin ([place]) 05/24/2018  . Chronic renal disease, stage III ([place]) 05/24/2018  . Parenchymal renal hypertension 05/24/2018  . Pure hypercholesterolemia 05/24/2018  . Class 1 obesity due to excess calories with serious comorbidity and body mass index (BMI) of 31.0 to 31.9 in adult 05/24/2018    Immunization History  Administered Date(s) Administered  . DTaP 06/17/2011  . Fluad Quad(high Dose 65+) 03/19/2019, 01/29/2020  . Influenza-Unspecified 02/06/2018  . PFIZER(Purple Top)SARS-COV-2 Vaccination 07/08/2019, 07/30/2019, 05/10/2020  . Pneumococcal Conjugate-13 05/07/2014  . Pneumococcal Polysaccharide-23 02/28/2013  . Pneumococcal-Unspecified 02/25/2013, 05/07/2015  . Zoster 04/30/2013    Conditions to be addressed/monitored:  Hypertension, Hyperlipidemia and Diabetes  Care Plan : [place]  Updates made by Mayford Knife, RPH since 10/08/2020 12:00 AM    Problem: HTN, HLD, DM, GERD   Priority: High    Long-Range Goal: Disease Management   Recent Progress: On track  Priority: High  Note:    Current Barriers:  . Unable to independently afford treatment regimen . Unable to achieve control of Cholesterol.     Pharmacist Clinical Goal(s):  Marland Kitchen Patient will achieve adherence to monitoring guidelines and medication adherence to achieve therapeutic efficacy . achieve control of cholesterol  as evidenced by decrease in LDL through collaboration with PharmD and provider.  Interventions: . 1:1 collaboration with Glendale Chard, MD regarding development and update of comprehensive  plan of care as evidenced by provider attestation and co-signature . Inter-disciplinary care team collaboration (see longitudinal plan of care) . Comprehensive medication review performed; medication list updated in electronic medical record  Hypertension (BP goal <130/80) -Controlled -Current treatment: . Olmesartan 20 mg take 1 tablet by mouth daily.  -Current home readings: 130/70  -Current dietary habits: avoiding salt  -Current exercise habits: the weather has decreased the amount of time she walks outside. She walks in the store -Denies hypotensive/hypertensive symptoms -Educated on Daily salt intake goal < 2300 mg; Exercise goal of 150 minutes per week; Importance of home blood pressure monitoring; -Counseled to monitor BP at home at least once per day, document, and provide log at future appointments -Counseled on diet and exercise extensively Recommended to continue current medication  Hyperlipidemia: (LDL goal < 70) -Uncontrolled -Current treatment: . Aspirin 81 mg tablet daily  . Pitavastatin 4 mg tablet once a day on Monday, Wednesday and Friday  o Patient given samples of Livalo 4 mg (14 tablets) to cover for the next month ([facility]: [phone] EXP: 07/2022 -Medications previously tried: Lovasatin 40 mg tablet  -Current dietary patterns: patient reports that she is eating the same way  -Current exercise habits: she started the walking program, but she went to the outlet and she feels like she walked for 50 miles. She walks around the track at the park 4 times they went on Monday, Tuesday, Wednesday and Thursday of last week.  -They will not cover the patient assistance because her insurance will pay for it  -She has been on other statin medications and it bothered her including muscle pain, she reports that when she stopped taking the medication she stopped hurting.  -Educated on Exercise goal of 150 minutes per week; -Counseled on diet and exercise extensively Recommended  to continue current medication Collaborated with PCP team to potentially change patients medication to [place] or per Dr. Baird Cancer patient will be referred to the lipid clinic for follow up.   Diabetes (A1c goal <7%) -Controlled -Current medications: . Ozempic 0.5 mg once a week on Thursday.  o Patient has been approved for patient assistance.  o She received the first shipment in February  . Pioglitazone-metformin 15-500 mg - take 1 tablet by mouth twice daily  -Medications previously tried: Victoza -Current home glucose readings . fasting glucose: [phone] -Denies hypoglycemic/hyperglycemic symptoms -Current meal patterns:  . breakfast: oatmeal, cream of wheat, or bacon, eggs and grits . dinner: vegetables, with meat and starch  . drinks: increasing the amount of water intake  -Current exercise: walking around the stores  -Educated on A1c and blood sugar goals; Exercise goal of 150 minutes per week; Benefits of weight loss; -Counseled to check feet daily and get yearly eye exams -Recommended to continue current medication  GERD  (Goal: signs and symptoms of acid reful) -Not ideally controlled -Current treatment  . Pantoprazole 40 mg tablet on  Monday, Wednesday and Friday . Dexilant 60 mg -take 1 capsule by mouth daily.  o Patient reports that medication was too expensive for her to pick up after she received samples.  - Will try to help patient with patient assistance.   -Recommended to continue current medication Collaborated with PCP and patient to complete patient assistance paperwork for Dexilant through the [employer].  Assessed patient finances. Patient should be a candidate for patient assistance program.   Health Maintenance -Current therapy:  . Patient would like to know more about the benefits of sea moss.             -We will discuss during the patients next office visit  -Educated on Herbal supplement research is limited and benefits usually cannot be  proven -Patient is satisfied with current therapy and denies issues  Patient Goals/Self-Care Activities . Patient will:  - take medications as prescribed check glucose  at least once per day, document, and provide at future appointments target a minimum of 150 minutes of moderate intensity exercise weekly  Follow Up Plan: Telephone follow up appointment with care management team member scheduled for: 10/22/2020 The patient has been provided with contact information for the care management team and has been advised to call with any health related questions or concerns.        Medication Assistance: Application for Livalo  medication assistance program. in process.  Anticipated assistance start date 10/2020.  See plan of care for additional detail.  Patient's preferred pharmacy is:  [facility] [address], [address] [place], [place] [address] Phone: [phone] Fax: [facility], [place] [address] Phone: [phone] Fax: [phone]  Uses pill box? Yes Pt endorses 80% compliance  We discussed: Current pharmacy is preferred with insurance plan and patient is satisfied with pharmacy services Patient decided to: Continue current medication management strategy  Care Plan and Follow Up Patient Decision:  Patient agrees to Care Plan and Follow-up.  Plan: The patient has been provided with contact information for the care management team and has been advised to call with any health related questions or concerns.   Orlando Penner, PharmD Clinical Pharmacist [facility] [phone]

## 2020-09-30 ENCOUNTER — Other Ambulatory Visit: Payer: Self-pay

## 2020-09-30 ENCOUNTER — Ambulatory Visit (INDEPENDENT_AMBULATORY_CARE_PROVIDER_SITE_OTHER): Payer: Medicare Other | Admitting: Internal Medicine

## 2020-09-30 ENCOUNTER — Encounter: Payer: Self-pay | Admitting: Internal Medicine

## 2020-09-30 VITALS — BP 132/70 | HR 82 | Temp 97.9°F | Ht <= 58 in | Wt 158.8 lb

## 2020-09-30 DIAGNOSIS — E1122 Type 2 diabetes mellitus with diabetic chronic kidney disease: Secondary | ICD-10-CM | POA: Diagnosis not present

## 2020-09-30 DIAGNOSIS — E7800 Pure hypercholesterolemia, unspecified: Secondary | ICD-10-CM

## 2020-09-30 DIAGNOSIS — N1831 Chronic kidney disease, stage 3a: Secondary | ICD-10-CM | POA: Diagnosis not present

## 2020-09-30 DIAGNOSIS — Z6832 Body mass index (BMI) 32.0-32.9, adult: Secondary | ICD-10-CM

## 2020-09-30 DIAGNOSIS — I129 Hypertensive chronic kidney disease with stage 1 through stage 4 chronic kidney disease, or unspecified chronic kidney disease: Secondary | ICD-10-CM

## 2020-09-30 DIAGNOSIS — E66811 Obesity, class 1: Secondary | ICD-10-CM

## 2020-09-30 DIAGNOSIS — E6609 Other obesity due to excess calories: Secondary | ICD-10-CM

## 2020-09-30 LAB — BMP8+EGFR
BUN/Creatinine Ratio: 14 (ref 12–28)
BUN: 18 mg/dL (ref 8–27)
CO2: 26 mmol/L (ref 20–29)
Calcium: 9.6 mg/dL (ref 8.7–10.3)
Chloride: 104 mmol/L (ref 96–106)
Creatinine, Ser: 1.26 mg/dL — ABNORMAL HIGH (ref 0.57–1.00)
Glucose: 108 mg/dL — ABNORMAL HIGH (ref 65–99)
Potassium: 4.5 mmol/L (ref 3.5–5.2)
Sodium: 143 mmol/L (ref 134–144)
eGFR: 45 mL/min/{1.73_m2} — ABNORMAL LOW (ref >59)

## 2020-09-30 LAB — HEMOGLOBIN A1C
Est. average glucose Bld gHb Est-mCnc: 134 mg/dL
Hgb A1c MFr Bld: 6.3 % — ABNORMAL HIGH (ref 4.8–5.6)

## 2020-09-30 NOTE — Patient Instructions (Addendum)
Type 2 Diabetes Mellitus, Diagnosis, Adult Type 2 diabetes (type 2 diabetes mellitus) is a long-term disease. It may happen when there is one or both of these problems:  The pancreas does not make enough insulin.  The body does not react in a normal way to insulin that it makes. Insulin lets sugars go into cells in your body. If you have type 2 diabetes, sugars cannot get into your cells. Sugars build up in the blood. This causes high blood sugar. What are the causes? The exact cause of this condition is not known. What increases the risk? The following factors may make you more likely to develop this condition:  Having type 2 diabetes in your family.  Being overweight or very overweight.  Not being active.  Your body not reacting in a normal way to the insulin it makes.  Having higher than normal blood sugar over time.  Having a type of diabetes when you were pregnant.  Having a condition that causes small fluid-filled sacs on your ovaries. What are the signs or symptoms? At first, you may have no symptoms. You will get symptoms slowly. They may include:  More thirst than normal.  More hunger than normal.  Needing to pee more than normal.  Losing weight without trying.  Feeling tired.  Feeling weak.  Seeing things blurry.  Dark patches on your skin. How is this treated? This condition may be treated by a diabetes expert. You may need to:  Follow an eating plan made by a food expert (dietitian).  Get regular exercise.  Find ways to deal with stress.  Check blood sugar as often as told.  Take medicines. Your doctor will set treatment goals for you. Your blood sugar should be at these levels:  Before meals: 80-130 mg/dL (4.4-7.2 mmol/L).  After meals: below 180 mg/dL (10 mmol/L).  Over the last 2-3 months: less than 7%. Follow these instructions at home: Medicines  Take your diabetes medicines or insulin every day.  Take medicines to help you not get  other problems caused by this condition. You may need: ? Aspirin. ? Medicine to lower cholesterol. ? Medicine to control blood pressure. Questions to ask your doctor  Should I meet with a diabetes educator?  What medicines do I need, and when should I take them?  What will I need to treat my condition at home?  When should I check my blood sugar?  Where can I find a support group?  Who can I call if I have questions?  When is my next doctor visit? General instructions  Take over-the-counter and prescription medicines only as told by your doctor.  Keep all follow-up visits as told by your doctor. This is important. Where to find more information  American Diabetes Association (ADA): www.diabetes.org  American Association of Diabetes Care and Education Specialists (ADCES): www.diabeteseducator.org  International Diabetes Federation (IDF): www.idf.org Contact a doctor if:  Your blood sugar is at or above 240 mg/dL (13.3 mmol/L) for 2 days in a row.  You have been sick for 2 days or more, and you are not getting better.  You have had a fever for 2 days or more, and you are not getting better.  You have any of these problems for more than 6 hours: ? You cannot eat or drink. ? You feel like you may vomit. ? You vomit. ? You have watery poop (diarrhea). Get help right away if:  Your blood sugar is very low. This means it is lower   than 54 mg/dL (3 mmol/L).  You feel mixed up (confused).  You have trouble thinking clearly.  You have trouble breathing.  You have medium or large ketone levels in your pee. These symptoms may be an emergency. Do not wait to see if the symptoms will go away. Get medical help right away. Call your local emergency services (911 in the [place] Do not drive yourself to the hospital. Summary  Type 2 diabetes is a long-term disease. Your pancreas may not make enough insulin, or your body may not react in a normal way to insulin that it  makes.  This condition is treated with an eating plan, lifestyle changes, and medicines.  Your doctor will set treatment goals for you. These will help you keep your blood sugar in a healthy range.  Keep all follow-up visits as told by your doctor. This is important. This information is not intended to replace advice given to you by your health care provider. Make sure you discuss any questions you have with your health care provider. Document Revised: 12/04/2019 Document Reviewed: 12/04/2019 Elsevier Patient Education  2021 [place]. Hypertension, Adult Hypertension is another name for high blood pressure. High blood pressure forces your heart to work harder to pump blood. This can cause problems over time. There are two numbers in a blood pressure reading. There is a top number (systolic) over a bottom number (diastolic). It is best to have a blood pressure that is below 120/80. Healthy choices can help lower your blood pressure, or you may need medicine to help lower it. What are the causes? The cause of this condition is not known. Some conditions may be related to high blood pressure. What increases the risk?  Smoking.  Having type 2 diabetes mellitus, high cholesterol, or both.  Not getting enough exercise or physical activity.  Being overweight.  Having too much fat, sugar, calories, or salt (sodium) in your diet.  Drinking too much alcohol.  Having long-term (chronic) kidney disease.  Having a family history of high blood pressure.  Age. Risk increases with age.  Race. You may be at higher risk if you are African American.  Gender. Men are at higher risk than women before age 50. After age 71, women are at higher risk than men.  Having obstructive sleep apnea.  Stress. What are the signs or symptoms?  High blood pressure may not cause symptoms. Very high blood pressure (hypertensive crisis) may cause: ? Headache. ? Feelings of worry or nervousness  (anxiety). ? Shortness of breath. ? Nosebleed. ? A feeling of being sick to your stomach (nausea). ? Throwing up (vomiting). ? Changes in how you see. ? Very bad chest pain. ? Seizures. How is this treated?  This condition is treated by making healthy lifestyle changes, such as: ? Eating healthy foods. ? Exercising more. ? Drinking less alcohol.  Your health care provider may prescribe medicine if lifestyle changes are not enough to get your blood pressure under control, and if: ? Your top number is above 130. ? Your bottom number is above 80.  Your personal target blood pressure may vary. Follow these instructions at home: Eating and drinking  If told, follow the DASH eating plan. To follow this plan: ? Fill one half of your plate at each meal with fruits and vegetables. ? Fill one fourth of your plate at each meal with whole grains. Whole grains include whole-wheat pasta, brown rice, and whole-grain bread. ? Eat or drink low-fat dairy products, such  as skim milk or low-fat yogurt. ? Fill one fourth of your plate at each meal with low-fat (lean) proteins. Low-fat proteins include fish, chicken without skin, eggs, beans, and tofu. ? Avoid fatty meat, cured and processed meat, or chicken with skin. ? Avoid pre-made or processed food.  Eat less than 1,500 mg of salt each day.  Do not drink alcohol if: ? Your doctor tells you not to drink. ? You are pregnant, may be pregnant, or are planning to become pregnant.  If you drink alcohol: ? Limit how much you use to:  0-1 drink a day for women.  0-2 drinks a day for men. ? Be aware of how much alcohol is in your drink. In the [place] one drink equals one 12 oz bottle of beer (355 mL), one 5 oz glass of wine (148 mL), or one 1 oz glass of hard liquor (44 mL).   Lifestyle  Work with your doctor to stay at a healthy weight or to lose weight. Ask your doctor what the best weight is for you.  Get at least 30 minutes of exercise most  days of the week. This may include walking, swimming, or biking.  Get at least 30 minutes of exercise that strengthens your muscles (resistance exercise) at least 3 days a week. This may include lifting weights or doing Pilates.  Do not use any products that contain nicotine or tobacco, such as cigarettes, e-cigarettes, and chewing tobacco. If you need help quitting, ask your doctor.  Check your blood pressure at home as told by your doctor.  Keep all follow-up visits as told by your doctor. This is important.   Medicines  Take over-the-counter and prescription medicines only as told by your doctor. Follow directions carefully.  Do not skip doses of blood pressure medicine. The medicine does not work as well if you skip doses. Skipping doses also puts you at risk for problems.  Ask your doctor about side effects or reactions to medicines that you should watch for. Contact a doctor if you:  Think you are having a reaction to the medicine you are taking.  Have headaches that keep coming back (recurring).  Feel dizzy.  Have swelling in your ankles.  Have trouble with your vision. Get help right away if you:  Get a very bad headache.  Start to feel mixed up (confused).  Feel weak or numb.  Feel faint.  Have very bad pain in your: ? Chest. ? Belly (abdomen).  Throw up more than once.  Have trouble breathing. Summary  Hypertension is another name for high blood pressure.  High blood pressure forces your heart to work harder to pump blood.  For most people, a normal blood pressure is less than 120/80.  Making healthy choices can help lower blood pressure. If your blood pressure does not get lower with healthy choices, you may need to take medicine. This information is not intended to replace advice given to you by your health care provider. Make sure you discuss any questions you have with your health care provider. Document Revised: 01/17/2018 Document Reviewed:  01/17/2018 Elsevier Patient Education  2021 [employer].

## 2020-09-30 NOTE — Progress Notes (Signed)
[name] as a Education administrator for Nicole Greenland, MD.,have documented all relevant documentation on the behalf of Nicole Greenland, MD,as directed by  Nicole Greenland, MD while in the presence of Nicole Greenland, MD. This visit occurred during the SARS-CoV-2 public health emergency.  Safety protocols were in place, including screening questions prior to the visit, additional usage of staff PPE, and extensive cleaning of exam room while observing appropriate contact time as indicated for disinfecting solutions.  Subjective:     Patient ID: Nicole Nunez , female    DOB: 10-Apr-1946 , 75 y.o.   MRN: [id number]   Chief Complaint  Patient presents with  . Diabetes  . Hypertension    HPI  She is here for a diabetes/htn check. She reports compliance with meds.She denies headaches, chest pain and shortness of breath. Pt has recently started exercises from [employer]. She reports her sugars range from 110-130.   Diabetes She presents for her follow-up diabetic visit. She has type 2 diabetes mellitus. Her disease course has been stable. There are no hypoglycemic associated symptoms. Pertinent negatives for diabetes include no blurred vision. There are no hypoglycemic complications. Current diabetic treatment includes oral agent (dual therapy). She is compliant with treatment most of the time. She is following a diabetic diet. She participates in exercise intermittently. An ACE inhibitor/angiotensin II receptor blocker is being taken. Eye exam is current.  Hypertension This is a chronic problem. The current episode started more than 1 year ago. The problem has been gradually improving since onset. The problem is controlled. Pertinent negatives include no blurred vision or palpitations. Hypertensive end-organ damage includes kidney disease.     Past Medical History:  Diagnosis Date  . CKD stage 3 due to type 2 diabetes mellitus ([place])   . DM (diabetes mellitus) ([place])   . High cholesterol   .  HTN (hypertension)      Family History  Problem Relation Age of Onset  . Asthma Mother   . Hypertension Mother   . Alzheimer's disease Mother   . Early death Father      Current Outpatient Medications:  .  aspirin EC 81 MG tablet, Take 81 mg by mouth daily., Disp: , Rfl:  .  b complex vitamins tablet, Take 1 tablet by mouth daily., Disp: , Rfl:  .  Calcium Carbonate (CALCIUM 600 PO), Take 1 tablet by mouth daily. , Disp: , Rfl:  .  Cholecalciferol (VITAMIN D3) 125 MCG (5000 UT) CAPS, Take 1 capsule by mouth daily. , Disp: , Rfl:  .  Coenzyme Q10 100 MG TABS, Take 1 tablet by mouth daily., Disp: , Rfl:  .  Lancet Devices (ONE TOUCH DELICA LANCING DEV) MISC, USE AS DIRECTED TO CHECK BLOOD SUGARS ONCE DAILY dx: e11.22, Disp: 1 each, Rfl: 2 .  Lancets (ONETOUCH DELICA PLUS [id number]) MISC, USE AS DIRECTED TO CHECK BLOOD SUGARS ONCE DAILY, Disp: 100 each, Rfl: 11 .  magnesium gluconate (MAGONATE) 500 MG tablet, Take 500 mg by mouth daily., Disp: , Rfl:  .  Multiple Vitamin (MULTIVITAMIN) tablet, Take 1 tablet by mouth daily., Disp: , Rfl:  .  olmesartan (BENICAR) 20 MG tablet, TAKE 1 TABLET BY MOUTH  DAILY, Disp: 90 tablet, Rfl: 3 .  ONETOUCH VERIO test strip, USE AS DIRECTED TO CHECK  BLOOD SUGAR ONCE DAILY, Disp: 100 strip, Rfl: 3 .  pantoprazole (PROTONIX) 40 MG tablet, TAKE 1 TABLET(40 MG) BY MOUTH DAILY, Disp: 90 tablet, Rfl: 1 .  pioglitazone-metformin (  ACTOPLUS MET) 15-500 MG tablet, TAKE 1 TABLET BY MOUTH  TWICE DAILY, Disp: 180 tablet, Rfl: 3 .  Pitavastatin Calcium (LIVALO) 4 MG TABS, Take one tablet by mouth on Monday Wednesday AND Friday, Disp: 60 tablet, Rfl: 3 .  Semaglutide,0.25 or 0.5MG /DOS, (OZEMPIC, 0.25 OR 0.5 MG/DOSE,) 2 MG/1.5ML SOPN, INJECT SUBCUTANEOUSLY 0.5MG  ONCE A WEEK ON THURSDAY, Disp: 1.5 mL, Rfl: 1   No Known Allergies   Review of Systems  Constitutional: Negative.   Eyes: Negative for blurred vision.  Respiratory: Negative.   Cardiovascular: Negative.   Negative for palpitations.  Neurological: Negative.   Psychiatric/Behavioral: Negative.      Today's Vitals   09/30/20 0854  BP: 132/70  Pulse: 82  Temp: 97.9 F (36.6 C)  SpO2: 99%  Weight: 158 lb 12.8 oz (72 kg)  Height: 4\' 10"  (1.473 m)  PainSc: 0-No pain   Body mass index is 33.19 kg/m.   Objective:  Physical Exam Vitals and nursing note reviewed.  Constitutional:      Appearance: Normal appearance. She is obese.  HENT:     Head: Normocephalic and atraumatic.     Nose:     Comments: Masked     Mouth/Throat:     Comments: Masked  Eyes:     Extraocular Movements: Extraocular movements intact.  Cardiovascular:     Rate and Rhythm: Normal rate and regular rhythm.     Heart sounds: Normal heart sounds.  Pulmonary:     Effort: Pulmonary effort is normal.     Breath sounds: Normal breath sounds.  Musculoskeletal:     Cervical back: Normal range of motion.  Skin:    General: Skin is warm.  Neurological:     General: No focal deficit present.     Mental Status: She is alert.  Psychiatric:        Mood and Affect: Mood normal.        Behavior: Behavior normal.         Assessment And Plan:     1. Type 2 diabetes mellitus with stage 3a chronic kidney disease, without long-term current use of insulin (HCC) Comments: Chronic, I will check labs as listed below. She is encouraged to limit her intake of sugary beverages, including diet drinks. - BMP8+EGFR - Hemoglobin A1c  2. Parenchymal renal hypertension, stage 1 through stage 4 or unspecified chronic kidney disease Comments: Chronic, fair control. I will not make any medication changes today. Advised to limit salt intake.   3. Pure hypercholesterolemia Comments: Livalo is the only statin she has tolerated, she can't afford it. She agrees to referral to [facility]. She was given samples today.  - AMB Referral to [facility]  4. Class 1 obesity due to excess calories with serious  comorbidity and body mass index (BMI) of 32.0 to 32.9 in adult She is encouraged to strive for BMI less than 30 to decrease cardiac risk. Advised to aim for at least 150 minutes of exercise per week.    Patient was given opportunity to ask questions. Patient verbalized understanding of the plan and was able to repeat key elements of the plan. All questions were answered to their satisfaction.   I, Nicole Greenland, MD, have reviewed all documentation for this visit. The documentation on 09/30/20 for the exam, diagnosis, procedures, and orders are all accurate and complete.   IF YOU HAVE BEEN REFERRED TO A SPECIALIST, IT MAY TAKE 1-2 WEEKS TO SCHEDULE/PROCESS THE REFERRAL. IF YOU HAVE  NOT HEARD FROM US/SPECIALIST IN TWO WEEKS, PLEASE GIVE [place] A CALL AT [phone].   THE PATIENT IS ENCOURAGED TO PRACTICE SOCIAL DISTANCING DUE TO THE COVID-19 PANDEMIC.

## 2020-10-02 ENCOUNTER — Telehealth: Payer: Self-pay

## 2020-10-02 NOTE — Telephone Encounter (Signed)
-----   Message from Glendale Chard, MD sent at 10/01/2020  7:27 PM EDT ----- Your kidney function is in stage 3 CKD range. Be sure to stay hydrated. Your hba1c is great at 6.3.   Keep up the great work!

## 2020-10-02 NOTE — Telephone Encounter (Signed)
Left the patient a message to call back for lab results. 

## 2020-10-07 ENCOUNTER — Telehealth: Payer: Self-pay

## 2020-10-08 NOTE — Patient Instructions (Signed)
Visit Information It was great speaking with you today!  Please let me know if you have any questions about our visit.  Goals Addressed            This Visit's Progress   . Manage My Medicine       Timeframe:  Long-Range Goal Priority:  High Start Date:                             Expected End Date:                       Follow Up Date 10/22/2020   - call for medicine refill 2 or 3 days before it runs out - call if I am sick and can't take my medicine - learn to read medicine labels - use a pillbox to sort medicine    Why is this important?   . These steps will help you keep on track with your medicines.          Patient Care Plan: CCM Pharmacy Care Plan    Problem Identified: HTN, HLD, DM, GERD   Priority: High    Long-Range Goal: Disease Management   Recent Progress: On track  Priority: High  Note:    Current Barriers:  . Unable to independently afford treatment regimen . Unable to achieve control of Cholesterol.     Pharmacist Clinical Goal(s):  Marland Kitchen Patient will achieve adherence to monitoring guidelines and medication adherence to achieve therapeutic efficacy . achieve control of cholesterol  as evidenced by decrease in LDL through collaboration with PharmD and provider.  Interventions: . 1:1 collaboration with Glendale Chard, MD regarding development and update of comprehensive plan of care as evidenced by provider attestation and co-signature . Inter-disciplinary care team collaboration (see longitudinal plan of care) . Comprehensive medication review performed; medication list updated in electronic medical record  Hypertension (BP goal <130/80) -Controlled -Current treatment: . Olmesartan 20 mg take 1 tablet by mouth daily.  -Current home readings: 130/70  -Current dietary habits: avoiding salt  -Current exercise habits: the weather has decreased the amount of time she walks outside. She walks in the store -Denies hypotensive/hypertensive  symptoms -Educated on Daily salt intake goal < 2300 mg; Exercise goal of 150 minutes per week; Importance of home blood pressure monitoring; -Counseled to monitor BP at home at least once per day, document, and provide log at future appointments -Counseled on diet and exercise extensively Recommended to continue current medication  Hyperlipidemia: (LDL goal < 70) -Uncontrolled -Current treatment: . Aspirin 81 mg tablet daily  . Pitavastatin 4 mg tablet once a day on Monday, Wednesday and Friday  o Patient given samples of Livalo 4 mg (14 tablets) to cover for the next month ([facility]: [phone] EXP: 07/2022 -Medications previously tried: Lovasatin 40 mg tablet  -Current dietary patterns: patient reports that she is eating the same way  -Current exercise habits: she started the walking program, but she went to the outlet and she feels like she walked for 50 miles. She walks around the track at the park 4 times they went on Monday, Tuesday, Wednesday and Thursday of last week.  -They will not cover the patient assistance because her insurance will pay for it  -She has been on other statin medications and it bothered her including muscle pain, she reports that when she stopped taking the medication she stopped hurting.  -Educated on Exercise goal of 150  minutes per week; -Counseled on diet and exercise extensively Recommended to continue current medication Collaborated with PCP team to potentially change patients medication to [place] or per Dr. Baird Cancer patient will be referred to the lipid clinic for follow up.   Diabetes (A1c goal <7%) -Controlled -Current medications: . Ozempic 0.5 mg once a week on Thursday.  o Patient has been approved for patient assistance.  o She received the first shipment in February  . Pioglitazone-metformin 15-500 mg - take 1 tablet by mouth twice daily  -Medications previously tried: Victoza -Current home glucose readings . fasting glucose: [id number] -Denies  hypoglycemic/hyperglycemic symptoms -Current meal patterns:  . breakfast: oatmeal, cream of wheat, or bacon, eggs and grits . dinner: vegetables, with meat and starch  . drinks: increasing the amount of water intake  -Current exercise: walking around the stores  -Educated on A1c and blood sugar goals; Exercise goal of 150 minutes per week; Benefits of weight loss; -Counseled to check feet daily and get yearly eye exams -Recommended to continue current medication  GERD  (Goal: signs and symptoms of acid reful) -Not ideally controlled -Current treatment  . Pantoprazole 40 mg tablet on  Monday, Wednesday and Friday . Dexilant 60 mg -take 1 capsule by mouth daily.  o Patient reports that medication was too expensive for her to pick up after she received samples.  - Will try to help patient with patient assistance.   -Recommended to continue current medication Collaborated with PCP and patient to complete patient assistance paperwork for Dexilant through the [employer].  Assessed patient finances. Patient should be a candidate for patient assistance program.   Health Maintenance -Current therapy:  . Patient would like to know more about the benefits of sea moss.             -We will discuss during the patients next office visit  -Educated on Herbal supplement research is limited and benefits usually cannot be proven -Patient is satisfied with current therapy and denies issues  Patient Goals/Self-Care Activities . Patient will:  - take medications as prescribed check glucose  at least once per day, document, and provide at future appointments target a minimum of 150 minutes of moderate intensity exercise weekly  Follow Up Plan: Telephone follow up appointment with care management team member scheduled for: 10/22/2020 The patient has been provided with contact information for the care management team and has been advised to call with any health related questions or concerns.        Patient agreed to services and verbal consent obtained.   The patient verbalized understanding of instructions, educational materials, and care plan provided today and agreed to receive a mailed copy of patient instructions, educational materials, and care plan.   Orlando Penner, PharmD Clinical Pharmacist [facility] [phone]

## 2020-10-13 ENCOUNTER — Encounter: Payer: Self-pay | Admitting: Internal Medicine

## 2020-10-15 DIAGNOSIS — B351 Tinea unguium: Secondary | ICD-10-CM | POA: Diagnosis not present

## 2020-10-15 DIAGNOSIS — E118 Type 2 diabetes mellitus with unspecified complications: Secondary | ICD-10-CM | POA: Diagnosis not present

## 2020-10-15 DIAGNOSIS — L84 Corns and callosities: Secondary | ICD-10-CM | POA: Diagnosis not present

## 2020-10-21 ENCOUNTER — Telehealth: Payer: Self-pay

## 2020-10-21 NOTE — Chronic Care Management (AMB) (Signed)
   No answer, left message of telephone appointment with Orlando Penner [facility] on 10-22-2020 at 8:00 .Left message to have all medications, supplements, blood pressure and/or blood sugar logs available during appointment and to return call if need to reschedule.   [place] Pharmacist Assistant [phone]

## 2020-10-22 ENCOUNTER — Ambulatory Visit (INDEPENDENT_AMBULATORY_CARE_PROVIDER_SITE_OTHER): Payer: Medicare Other

## 2020-10-22 DIAGNOSIS — E1122 Type 2 diabetes mellitus with diabetic chronic kidney disease: Secondary | ICD-10-CM | POA: Diagnosis not present

## 2020-10-22 DIAGNOSIS — E7800 Pure hypercholesterolemia, unspecified: Secondary | ICD-10-CM

## 2020-10-22 DIAGNOSIS — N1831 Chronic kidney disease, stage 3a: Secondary | ICD-10-CM | POA: Diagnosis not present

## 2020-10-22 DIAGNOSIS — K219 Gastro-esophageal reflux disease without esophagitis: Secondary | ICD-10-CM

## 2020-10-22 NOTE — Progress Notes (Signed)
Chronic Care Management Pharmacy Note  11/03/2020 Name:  Nicole Nunez MRN:  [id number] DOB:  [birth year]  Summary: Patient reports that she can not afford Livalo.   Recommendations/Changes made from today's visit: Patient to receive additional samples of Livalo.  Patient to look into receiving booster vaccine  Plan: Patient to follow up with an appointment at the [facility] in September Collaborate with patient to determine when she will receive the Shingrix vaccine   Subjective: Nicole Nunez is an 75 y.o. year old female who is a primary patient of Glendale Chard, MD.  The CCM team was consulted for assistance with disease management and care coordination needs.  Her son got married on 10/03/2020 and she has been busy doing other activities.   Engaged with patient face to face for follow up visit in response to provider referral for pharmacy case management and/or care coordination services.   Consent to Services:  The patient was given information about Chronic Care Management services, agreed to services, and gave verbal consent prior to initiation of services.  Please see initial visit note for detailed documentation.   Patient Care Team: Glendale Chard, MD as PCP - General (Internal Medicine) Rex Kras Claudette Stapler, RN as Case Manager Mayford Knife, [facility] (Pharmacist)  Recent office visits: 09/30/2020 PCP OV  06/02/2020 PCP OV   Recent consult visits: 07/30/2020 Opthamology [facility] visits: None in previous 6 months   Objective:  Lab Results  Component Value Date   CREATININE 1.26 (H) 09/30/2020   BUN 18 09/30/2020   GFRNONAA 41 (L) 06/02/2020   GFRAA 47 (L) 06/02/2020   NA 143 09/30/2020   K 4.5 09/30/2020   CALCIUM 9.6 09/30/2020   CO2 26 09/30/2020   GLUCOSE 108 (H) 09/30/2020    Lab Results  Component Value Date/Time   HGBA1C 6.3 (H) 09/30/2020 09:18 AM   HGBA1C 6.6 (H) 06/02/2020 09:42 AM   MICROALBUR 10 01/29/2020 12:51 PM   MICROALBUR  30 12/20/2018 12:33 PM    Last diabetic Eye exam:  Lab Results  Component Value Date/Time   HMDIABEYEEXA Retinopathy (A) 07/30/2020 12:00 AM    Last diabetic Foot exam: No results found for: HMDIABFOOTEX   Lab Results  Component Value Date   CHOL 185 06/02/2020   HDL 64 06/02/2020   LDLCALC 108 (H) 06/02/2020   TRIG 68 06/02/2020   CHOLHDL 2.9 06/02/2020    Hepatic Function Latest Ref Rng & Units 06/02/2020 01/29/2020 07/30/2019  Total Protein 6.0 - 8.5 g/dL 6.9 7.0 6.7  Albumin 3.7 - 4.7 g/dL 4.3 4.6 4.2  AST 0 - 40 IU/L '20 22 22  ' ALT 0 - 32 IU/L '15 15 10  ' Alk Phosphatase 44 - 121 IU/L 98 98 89  Total Bilirubin 0.0 - 1.2 mg/dL 0.5 0.5 0.4    No results found for: TSH, FREET4  CBC Latest Ref Rng & Units 01/29/2020 12/20/2018 11/11/2008  WBC 3.4 - 10.8 x10E3/uL 5.7 5.0 5.6  Hemoglobin 11.1 - 15.9 g/dL 11.1 11.2 11.7(L)  Hematocrit 34.0 - 46.6 % 34.0 34.8 34.7(L)  Platelets 150 - 450 x10E3/uL 321 311 270    Lab Results  Component Value Date/Time   VD25OH 65.4 01/29/2020 02:42 PM    Clinical ASCVD: No  The 10-year ASCVD risk score Mikey Bussing DC Jr., et al., 2013) is: 29.2%   Values used to calculate the score:     Age: 75 years     Sex: Female     Is  Non-Hispanic African American: Yes     Diabetic: Yes     Tobacco smoker: No     Systolic Blood Pressure: 563 mmHg     Is BP treated: Yes     HDL Cholesterol: 64 mg/dL     Total Cholesterol: 185 mg/dL    Depression screen [facility] 2/9 01/29/2020 12/20/2018 08/27/2018  Decreased Interest 0 0 0  Down, Depressed, Hopeless 0 0 0  PHQ - 2 Score 0 0 0  Altered sleeping 0 0 -  Tired, decreased energy 0 0 -  Change in appetite 0 0 -  Feeling bad or failure about yourself  0 0 -  Trouble concentrating 0 0 -  Moving slowly or fidgety/restless 0 0 -  Suicidal thoughts 0 0 -  PHQ-9 Score 0 0 -  Difficult doing work/chores Not difficult at all - -     Social History   Tobacco Use  Smoking Status Never  Smokeless Tobacco Never   BP  Readings from Last 3 Encounters:  09/30/20 132/70  06/02/20 118/76  01/29/20 136/70   Pulse Readings from Last 3 Encounters:  09/30/20 82  06/02/20 83  01/29/20 83   Wt Readings from Last 3 Encounters:  09/30/20 158 lb 12.8 oz (72 kg)  06/02/20 161 lb (73 kg)  01/29/20 158 lb 9.6 oz (71.9 kg)   BMI Readings from Last 3 Encounters:  09/30/20 33.19 kg/m  06/02/20 33.19 kg/m  01/29/20 32.69 kg/m    Assessment/Interventions: Review of patient past medical history, allergies, medications, health status, including review of consultants reports, laboratory and other test data, was performed as part of comprehensive evaluation and provision of chronic care management services.   SDOH:  (Social Determinants of Health) assessments and interventions performed: Yes  SDOH Screenings   Alcohol Screen: Not on file  Depression (PHQ2-9): Low Risk    PHQ-2 Score: 0  Financial Resource Strain: Low Risk    Difficulty of Paying Living Expenses: Not hard at all  Food Insecurity: No Food Insecurity   Worried About Charity fundraiser in the Last Year: Never true   Ran Out of Food in the Last Year: Never true  Housing: Not on file  Physical Activity: Sufficiently Active   Days of Exercise per Week: 4 days   Minutes of Exercise per Session: 60 min  Social Connections: Unknown   Frequency of Communication with Friends and Family: More than three times a week   Frequency of Social Gatherings with Friends and Family: More than three times a week   Attends Religious Services: More than 4 times per year   Active Member of [employer] or Organizations: Yes   Attends Music therapist: More than 4 times per year   Marital Status: Not on file  Stress: No Stress Concern Present   Feeling of Stress : Not at all  Tobacco Use: Low Risk    Smoking Tobacco Use: Never   Smokeless Tobacco Use: Never  Transportation Needs: No Transportation Needs   Lack of Transportation (Medical): No   Lack of  Transportation (Non-Medical): No    CCM Care Plan  No Known Allergies  Medications Reviewed Today     Reviewed by Mayford Knife, [facility] (Pharmacist) on 10/22/20 at [phone]  Med List Status: <None>   Medication Order Taking? Sig Documenting Provider Last Dose Status Informant  aspirin EC 81 MG tablet [id number] No Take 81 mg by mouth daily. [provider] Taking Active   b complex vitamins  tablet [id number] No Take 1 tablet by mouth daily. [provider] Taking Active Self  Calcium Carbonate (CALCIUM 600 PO) [id number] No Take 1 tablet by mouth daily.  [provider] Taking Active   Cholecalciferol (VITAMIN D3) 125 MCG (5000 UT) CAPS [id number] No Take 1 capsule by mouth daily.  [provider] Taking Active   Coenzyme Q10 100 MG TABS [id number] No Take 1 tablet by mouth daily. [provider] Taking Active   Lancet Devices (ONE TOUCH DELICA LANCING DEV) MISC [id number] No USE AS DIRECTED TO CHECK BLOOD SUGARS ONCE DAILY dx: e11.22 Glendale Chard, MD Taking Active   Lancets (ONETOUCH DELICA PLUS JOACZY60Y) [place] [id number] No USE AS DIRECTED TO CHECK BLOOD SUGARS ONCE DAILY Glendale Chard, MD Taking Active   magnesium gluconate (MAGONATE) 500 MG tablet [id number] No Take 500 mg by mouth daily. [provider] Taking Active Self  Multiple Vitamin (MULTIVITAMIN) tablet [id number] No Take 1 tablet by mouth daily. [provider] Taking Active Self  olmesartan (BENICAR) 20 MG tablet [id number] No TAKE 1 TABLET BY MOUTH  DAILY Glendale Chard, MD Taking Active   [facility] VERIO test strip [id number] No USE AS DIRECTED TO CHECK  BLOOD SUGAR ONCE DAILY Glendale Chard, MD Taking Active   pantoprazole (PROTONIX) 40 MG tablet [id number] No TAKE 1 TABLET(40 MG) BY MOUTH DAILY Glendale Chard, MD Taking Active   pioglitazone-metformin (ACTOPLUS MET) 15-500 MG tablet [id number] No TAKE 1 TABLET BY MOUTH  TWICE DAILY Glendale Chard, MD Taking Active    Pitavastatin Calcium (LIVALO) 4 MG TABS [id number] No Take one tablet by mouth on Monday Wednesday AND Friday Glendale Chard, MD Taking Active   Semaglutide,0.25 or 0.5MG/DOS, (OZEMPIC, 0.25 OR 0.5 MG/DOSE,) 2 MG/1.5ML [place] Aid [id number] No INJECT SUBCUTANEOUSLY 0.5MG ONCE A WEEK ON Alfredo Bach, MD Taking Active             Patient Active Problem List   Diagnosis Date Noted   Notalgia 01/29/2020   Vitamin D deficiency disease 01/29/2020   Class 1 obesity due to excess calories with serious comorbidity and body mass index (BMI) of 32.0 to 32.9 in adult 01/29/2020   Type 2 diabetes mellitus with stage 3 chronic kidney disease, without long-term current use of insulin ([place]) 05/24/2018   Chronic renal disease, stage III ([place]) 05/24/2018   Parenchymal renal hypertension 05/24/2018   Pure hypercholesterolemia 05/24/2018   Class 1 obesity due to excess calories with serious comorbidity and body mass index (BMI) of 31.0 to 31.9 in adult 05/24/2018    Immunization History  Administered Date(s) Administered   DTaP 06/17/2011   Fluad Quad(high Dose 65+) 03/19/2019, 01/29/2020   Influenza-Unspecified 02/06/2018   PFIZER(Purple Top)SARS-COV-2 Vaccination 07/08/2019, 07/30/2019, 05/10/2020   Pneumococcal Conjugate-13 05/07/2014   Pneumococcal Polysaccharide-23 02/28/2013   Pneumococcal-Unspecified 02/25/2013, 05/07/2015   Zoster, Live 04/30/2013    Conditions to be addressed/monitored:  Hypertension and Hyperlipidemia  Care Plan : [place]  Updates made by Mayford Knife, [place] since 11/03/2020 12:00 AM     Problem: HTN, HLD, DM, GERD   Priority: High     Long-Range Goal: Disease Management   Recent Progress: On track  Priority: High  Note:    Current Barriers:  Unable to independently afford treatment regimen Unable to achieve control of Cholesterol.   Unable to independently monitor therapeutic efficacy    Pharmacist Clinical Goal(s):  Patient will  achieve adherence to monitoring guidelines and medication adherence to achieve therapeutic efficacy achieve control of  cholesterol  as evidenced by decrease in LDL through collaboration with PharmD and provider.  Interventions: 1:1 collaboration with Glendale Chard, MD regarding development and update of comprehensive plan of care as evidenced by provider attestation and co-signature Inter-disciplinary care team collaboration (see longitudinal plan of care) Comprehensive medication review performed; medication list updated in electronic medical record  Hypertension (BP goal <130/80) -Controlled -Current treatment: Olmesartan 20 mg take 1 tablet by mouth daily.  --Current dietary habits: patient is not using salt -Current exercise habits: good days and bad days. 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  She is more active then she was, but she needs to increase exercise -Denies hypotensive/hypertensive symptoms -Educated on BP goals and benefits of medications for prevention of heart attack, stroke and kidney damage; Daily salt intake goal < 2300 mg; Importance of home blood pressure monitoring; Proper BP monitoring technique;  -Increase the amount of water she is drinking. She is going to set out at least 4 bottles of 16 ounces and finish by 6 pm.  -Counseled to monitor BP at home at least twice per week, document, and provide log at future appointments -Recommended to continue current medication  Hyperlipidemia: (LDL goal < 70) -Uncontrolled -Current treatment: Aspirin 81 mg tablet daily  Pitavastatin 4 mg tablet once a day on Monday, Wednesday and Friday  Patient given samples of Livalo 4 mg (14 tablets) to cover for the next month (NDC: 4340161309 EXP: 07/2022 -Medications previously tried: Lovasatin 40 mg tablet  -Current dietary patterns: patient reports that she is eating the same way  -Current  exercise habits: she started the walking program, but she went to the outlet and she feels like she walked for 50 miles. She walks around the track at the park 4 times they went on Monday, Tuesday, Wednesday and Thursday of last week.  -They will not cover the patient assistance because her insurance will pay for it  -She has been on other statin medications and it bothered her including muscle pain, she reports that when she stopped taking the medication she stopped hurting.  -Educated on Exercise goal of 150 minutes per week; -Counseled on diet and exercise extensively Recommended to continue current medication Collaborated with PCP team to potentially change patients medication to [place] or per Dr. Baird Cancer patient will be referred to the lipid clinic for follow up.  -Per [place] and mail order patients medication will be well over 100 dollars for a 30 day supply.  -Collaborated with PCP team to make sure patient received sample of medication a total of 6 boxes of seven were given to her.  -She is going to follow up with the lipid clinic in September to confirm her medication.  -Educated on Cholesterol goals;  Importance of limiting foods high in cholesterol; Exercise goal of 150 minutes per week; -Recommended to continue current medication  Diabetes (A1c goal <7%) -Controlled -Current medications: Ozempic 0.5 mg once a week on Thursday.  Patient has been approved for patient assistance.  She received the first shipment in February  Pioglitazone-metformin 15-500 mg - take 1 tablet by mouth twice daily  -Medications previously tried: Victoza -Current home glucose readings fasting glucose: [phone] -Denies hypoglycemic/hyperglycemic symptoms -Current meal patterns:  breakfast: oatmeal, cream of wheat, or bacon, eggs and grits dinner: vegetables, with meat and starch  drinks: increasing the amount of water intake  -Current exercise: walking around  the stores  -Educated on  A1c and blood sugar goals; Exercise goal of 150 minutes per week; Benefits of weight loss; -Counseled to check feet daily and get yearly eye exams -Recommended to continue current medication  GERD  (Goal: signs and symptoms of acid reful) -Not ideally controlled -Current treatment  Pantoprazole 40 mg tablet on  Monday, Wednesday and Friday Dexilant 60 mg -take 1 capsule by mouth daily.  Patient reports that medication was too expensive for her to pick up after she received samples.  Will try to help patient with patient assistance.   -Recommended to continue current medication Collaborated with PCP and patient to complete patient assistance paperwork for Dexilant through the [employer].  Assessed patient finances. Patient should be a candidate for patient assistance program.   Health Maintenance -Current therapy:  Patient would like to know more about the benefits of sea moss.             -We will discuss during the patients next office visit  -Educated on Herbal supplement research is limited and benefits usually cannot be proven -Patient is satisfied with current therapy and denies issues  Patient Goals/Self-Care Activities Patient will:  - take medications as prescribed check glucose  at least once per day, document, and provide at future appointments target a minimum of 150 minutes of moderate intensity exercise weekly  Follow Up Plan: Telephone follow up appointment with care management team member scheduled for: 10/22/2020 The patient has been provided with contact information for the care management team and has been advised to call with any health related questions or concerns.        Medication Assistance:  Patient unable to use the program because the insurance would cover Livalo.   Compliance/Adherence/Medication fill history: Care Gaps: COVID -19 Vaccination second booster Shingrix second dose   Star-Rating Drugs: Pitavastatin 4 mg tablet Olmesartan 20  mg tablet  Ozempic 0.5 mg  Pioglitazone - Metformin 15-500 mg   Patient's preferred pharmacy is:  [facility]  ([place], [address], [address] [place], [place] [address] Phone: [phone] Fax: [place], [place] [address] Phone: [phone] Fax: [phone]  Uses pill box? Yes Pt endorses 90% compliance  We discussed: Current pharmacy is preferred with insurance plan and patient is satisfied with pharmacy services Patient decided to: Continue current medication management strategy  Care Plan and Follow Up Patient Decision:  Patient agrees to Care Plan and Follow-up.  Plan: The patient has been provided with contact information for the care management team and has been advised to call with any health related questions or concerns.   Orlando Penner, PharmD Clinical Pharmacist [facility] [phone]

## 2020-11-03 NOTE — Patient Instructions (Addendum)
Visit Information It was great speaking with you today!  Please let me know if you have any questions about our visit.   Goals Addressed             This Visit's Progress    Manage My Medicine       Timeframe:  Long-Range Goal Priority:  High Start Date:                             Expected End Date:                       Follow Up Date 03/02/2021   - call for medicine refill 2 or 3 days before it runs out - call if I am sick and can't take my medicine - learn to read medicine labels - use a pillbox to sort medicine    Why is this important?   These steps will help you keep on track with your medicines.            Patient Care Plan: CCM Pharmacy Care Plan     Problem Identified: HTN, HLD, DM, GERD   Priority: High     Long-Range Goal: Disease Management   Recent Progress: On track  Priority: High  Note:    Current Barriers:  Unable to independently afford treatment regimen Unable to achieve control of Cholesterol.   Unable to independently monitor therapeutic efficacy    Pharmacist Clinical Goal(s):  Patient will achieve adherence to monitoring guidelines and medication adherence to achieve therapeutic efficacy achieve control of cholesterol  as evidenced by decrease in LDL through collaboration with PharmD and provider.  Interventions: 1:1 collaboration with Glendale Chard, MD regarding development and update of comprehensive plan of care as evidenced by provider attestation and co-signature Inter-disciplinary care team collaboration (see longitudinal plan of care) Comprehensive medication review performed; medication list updated in electronic medical record  Hypertension (BP goal <130/80) -Controlled -Current treatment: Olmesartan 20 mg take 1 tablet by mouth daily.  --Current dietary habits: patient is not using salt -Current exercise habits: good days and bad days. 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  She is more active then she was, but she needs to increase exercise -Denies hypotensive/hypertensive symptoms -Educated on BP goals and benefits of medications for prevention of heart attack, stroke and kidney damage; Daily salt intake goal < 2300 mg; Importance of home blood pressure monitoring; Proper BP monitoring technique;  -Increase the amount of water she is drinking. She is going to set out at least 4 bottles of 16 ounces and finish by 6 pm.  -Counseled to monitor BP at home at least twice per week, document, and provide log at future appointments -Recommended to continue current medication  Hyperlipidemia: (LDL goal < 70) -Uncontrolled -Current treatment: Aspirin 81 mg tablet daily  Pitavastatin 4 mg tablet once a day on Monday, Wednesday and Friday  Patient given samples of Livalo 4 mg (14 tablets) to cover for the next month (NDC: [id number] EXP: 07/2022 -Medications previously tried: Lovasatin 40 mg tablet  -Current dietary patterns: patient reports that she is eating the same way  -Current exercise habits: she started the walking program, but she went to the outlet and she feels like she walked for 50 miles. She walks around the track at the park 4  times they went on Monday, Tuesday, Wednesday and Thursday of last week.  -They will not cover the patient assistance because her insurance will pay for it  -She has been on other statin medications and it bothered her including muscle pain, she reports that when she stopped taking the medication she stopped hurting.  -Educated on Exercise goal of 150 minutes per week; -Counseled on diet and exercise extensively Recommended to continue current medication Collaborated with PCP team to potentially change patients medication to [place] or per Dr. Baird Cancer patient will be referred to the lipid clinic for follow up.  -Per [place] and mail order patients medication will be well over 100  dollars for a 30 day supply.  -Collaborated with PCP team to make sure patient received sample of medication a total of 6 boxes of seven were given to her.  -She is going to follow up with the lipid clinic in September to confirm her medication.  -Educated on Cholesterol goals;  Importance of limiting foods high in cholesterol; Exercise goal of 150 minutes per week; -Recommended to continue current medication  Diabetes (A1c goal <7%) -Controlled -Current medications: Ozempic 0.5 mg once a week on Thursday.  Patient has been approved for patient assistance.  She received the first shipment in February  Pioglitazone-metformin 15-500 mg - take 1 tablet by mouth twice daily  -Medications previously tried: Victoza -Current home glucose readings fasting glucose: [phone] -Denies hypoglycemic/hyperglycemic symptoms -Current meal patterns:  breakfast: oatmeal, cream of wheat, or bacon, eggs and grits dinner: vegetables, with meat and starch  drinks: increasing the amount of water intake  -Current exercise: walking around the stores  -Educated on A1c and blood sugar goals; Exercise goal of 150 minutes per week; Benefits of weight loss; -Counseled to check feet daily and get yearly eye exams -Recommended to continue current medication  GERD  (Goal: signs and symptoms of acid reful) -Not ideally controlled -Current treatment  Pantoprazole 40 mg tablet on  Monday, Wednesday and Friday Dexilant 60 mg -take 1 capsule by mouth daily.  Patient reports that medication was too expensive for her to pick up after she received samples.  Will try to help patient with patient assistance.   -Recommended to continue current medication Collaborated with PCP and patient to complete patient assistance paperwork for Dexilant through the [employer].  Assessed patient finances. Patient should be a candidate for patient assistance program.   Health Maintenance -Current therapy:  Patient would like  to know more about the benefits of sea moss.             -We will discuss during the patients next office visit  -Educated on Herbal supplement research is limited and benefits usually cannot be proven -Patient is satisfied with current therapy and denies issues  Patient Goals/Self-Care Activities Patient will:  - take medications as prescribed check glucose  at least once per day, document, and provide at future appointments target a minimum of 150 minutes of moderate intensity exercise weekly  Follow Up Plan: Telephone follow up appointment with care management team member scheduled for: 10/22/2020 The patient has been provided with contact information for the care management team and has been advised to call with any health related questions or concerns.       Patient agreed to services and verbal consent obtained.   The patient verbalized understanding of instructions, educational materials, and care plan provided today and agreed to receive a mailed copy of patient instructions, educational materials, and care plan.  Orlando Penner, PharmD Clinical Pharmacist [facility] [phone]

## 2020-12-01 ENCOUNTER — Telehealth: Payer: Self-pay

## 2020-12-17 NOTE — Chronic Care Management (AMB) (Signed)
    Chronic Care Management Pharmacy Assistant   Name: Nicole Nunez  MRN: [id number] DOB: 03-12-1946  Reason for Encounter: Patient Assistance Coordination  12/01/2020- Patient Assistance reorder form filled out with [employer] patient assistance program for [employer]. Printed, awaiting provider signature to fax..  [phone]- Faxed application to [employer] patient assistance program.  Medications: Outpatient Encounter Medications as of 12/01/2020  Medication Sig   aspirin EC 81 MG tablet Take 81 mg by mouth daily.   b complex vitamins tablet Take 1 tablet by mouth daily.   Calcium Carbonate (CALCIUM 600 PO) Take 1 tablet by mouth daily.    Cholecalciferol (VITAMIN D3) 125 MCG (5000 UT) CAPS Take 1 capsule by mouth daily.    Coenzyme Q10 100 MG TABS Take 1 tablet by mouth daily.   Lancet Devices (ONE TOUCH DELICA LANCING DEV) MISC USE AS DIRECTED TO CHECK BLOOD SUGARS ONCE DAILY dx: e11.22   Lancets (ONETOUCH DELICA PLUS [id number]) MISC USE AS DIRECTED TO CHECK BLOOD SUGARS ONCE DAILY   magnesium gluconate (MAGONATE) 500 MG tablet Take 500 mg by mouth daily.   Multiple Vitamin (MULTIVITAMIN) tablet Take 1 tablet by mouth daily.   olmesartan (BENICAR) 20 MG tablet TAKE 1 TABLET BY MOUTH  DAILY   ONETOUCH VERIO test strip USE AS DIRECTED TO CHECK  BLOOD SUGAR ONCE DAILY   pantoprazole (PROTONIX) 40 MG tablet TAKE 1 TABLET(40 MG) BY MOUTH DAILY   pioglitazone-metformin (ACTOPLUS MET) 15-500 MG tablet TAKE 1 TABLET BY MOUTH  TWICE DAILY   Pitavastatin Calcium (LIVALO) 4 MG TABS Take one tablet by mouth on Monday Wednesday AND Friday   Semaglutide,0.25 or 0.5MG /DOS, (OZEMPIC, 0.25 OR 0.5 MG/DOSE,) 2 MG/1.5ML SOPN INJECT SUBCUTANEOUSLY 0.5MG  ONCE A WEEK ON THURSDAY   No facility-administered encounter medications on file as of 12/01/2020.    Care Gaps: Zoster Vaccines- Shingrix COVID-19 Vaccine(Dose 4 - Booster for [employer] series)  Star Rating Drugs: Olmesartan 20 mg- Last filled  08/19/2020 for 90 day supply  Pioglitazone/Metformin 15-500 mg- Last filled 06/17/2020 for 90 day supply at [facility] filled 10/18/18- PAP  SIG: [name], [place] Pharmacist Assistant [phone]

## 2020-12-23 DIAGNOSIS — L84 Corns and callosities: Secondary | ICD-10-CM | POA: Diagnosis not present

## 2020-12-23 DIAGNOSIS — B351 Tinea unguium: Secondary | ICD-10-CM | POA: Diagnosis not present

## 2020-12-23 DIAGNOSIS — E118 Type 2 diabetes mellitus with unspecified complications: Secondary | ICD-10-CM | POA: Diagnosis not present

## 2020-12-31 ENCOUNTER — Ambulatory Visit (INDEPENDENT_AMBULATORY_CARE_PROVIDER_SITE_OTHER): Payer: Medicare Other

## 2020-12-31 ENCOUNTER — Other Ambulatory Visit: Payer: Self-pay

## 2020-12-31 VITALS — BP 118/58 | HR 86 | Temp 98.6°F | Ht <= 58 in | Wt 148.2 lb

## 2020-12-31 DIAGNOSIS — Z0000 Encounter for general adult medical examination without abnormal findings: Secondary | ICD-10-CM | POA: Diagnosis not present

## 2020-12-31 NOTE — Progress Notes (Signed)
This visit occurred during the SARS-CoV-2 public health emergency.  Safety protocols were in place, including screening questions prior to the visit, additional usage of staff PPE, and extensive cleaning of exam room while observing appropriate contact time as indicated for disinfecting solutions.  Subjective:   Nicole Nunez is a 75 y.o. female who presents for Medicare Annual (Subsequent) preventive examination.  Review of Systems     Cardiac Risk Factors include: advanced age (>30mn, >>65women);diabetes mellitus;hypertension;obesity (BMI >30kg/m2);sedentary lifestyle     Objective:    Today's Vitals   12/31/20 0837  BP: (!) 118/58  Pulse: 86  Temp: 98.6 F (37 C)  TempSrc: Oral  SpO2: 97%  Weight: 148 lb 3.2 oz (67.2 kg)  Height: _0  (1.473 m)   Body mass index is 30.97 kg/m.  Advanced Directives 12/31/2020 01/29/2020 12/20/2018  Does Patient Have a Medical Advance Directive? No No No  Would patient like information on creating a medical advance directive? - Yes (MAU/Ambulatory/Procedural Areas - Information given) No - Patient declined    Current Medications (verified) Outpatient Encounter Medications as of 12/31/2020  Medication Sig   aspirin EC 81 MG tablet Take 81 mg by mouth daily.   b complex vitamins tablet Take 1 tablet by mouth daily.   Calcium Carbonate (CALCIUM 600 PO) Take 1 tablet by mouth daily.    Cholecalciferol (VITAMIN D3) 125 MCG (5000 UT) CAPS Take 1 capsule by mouth daily.    Coenzyme Q10 100 MG TABS Take 1 tablet by mouth daily.   Lancet Devices (ONE TOUCH DELICA LANCING DEV) MISC USE AS DIRECTED TO CHECK BLOOD SUGARS ONCE DAILY dx: e11.22   Lancets (ONETOUCH DELICA PLUS [id number] MISC USE AS DIRECTED TO CHECK BLOOD SUGARS ONCE DAILY   magnesium gluconate (MAGONATE) 500 MG tablet Take 500 mg by mouth daily.   Multiple Vitamin (MULTIVITAMIN) tablet Take 1 tablet by mouth daily.   olmesartan (BENICAR) 20 MG tablet TAKE 1 TABLET BY MOUTH  DAILY    Omega-3 Fatty Acids (FISH OIL) 1000 MG CAPS Take 1 capsule by mouth daily.   ONETOUCH VERIO test strip USE AS DIRECTED TO CHECK  BLOOD SUGAR ONCE DAILY   pantoprazole (PROTONIX) 40 MG tablet TAKE 1 TABLET(40 MG) BY MOUTH DAILY   pioglitazone-metformin (ACTOPLUS MET) 15-500 MG tablet TAKE 1 TABLET BY MOUTH  TWICE DAILY   Pitavastatin Calcium (LIVALO) 4 MG TABS Take one tablet by mouth on Monday Wednesday AND Friday   Semaglutide,0.25 or 0.5MG/DOS, (OZEMPIC, 0.25 OR 0.5 MG/DOSE,) 2 MG/1.5ML SOPN INJECT SUBCUTANEOUSLY 0.5MG ONCE A WEEK ON THURSDAY   No facility-administered encounter medications on file as of 12/31/2020.    Allergies (verified) Patient has no known allergies.   History: Past Medical History:  Diagnosis Date   CKD stage 3 due to type 2 diabetes mellitus (HCC)    DM (diabetes mellitus) ([facility]    High cholesterol    HTN (hypertension)    Past Surgical History:  Procedure Laterality Date   CATARACT EXTRACTION, BILATERAL  01/2018   first was 12/2017   corn removal Bilateral 06/2017   Family History  Problem Relation Age of Onset   Asthma Mother    Hypertension Mother    Alzheimer's disease Mother    Early death Father    Social History   Socioeconomic History   Marital status: Widowed    Spouse name: Not on file   Number of children: Not on file   Years of education: Not on file  Highest education level: Not on file  Occupational History   Not on file  Tobacco Use   Smoking status: Never   Smokeless tobacco: Never  Vaping Use   Vaping Use: Never used  Substance and Sexual Activity   Alcohol use: Never   Drug use: Never   Sexual activity: Yes  Other Topics Concern   Not on file  Social History Narrative   Not on file   Social Determinants of Health   Financial Resource Strain: Low Risk    Difficulty of Paying Living Expenses: Not hard at all  Food Insecurity: No Food Insecurity   Worried About Charity fundraiser in the Last Year: Never true    [place] in the Last Year: Never true  Transportation Needs: No Transportation Needs   Lack of Transportation (Medical): No   Lack of Transportation (Non-Medical): No  Physical Activity: Inactive   Days of Exercise per Week: 0 days   Minutes of Exercise per Session: 0 min  Stress: No Stress Concern Present   Feeling of Stress : Not at all  Social Connections: Unknown   Frequency of Communication with Friends and Family: More than three times a week   Frequency of Social Gatherings with Friends and Family: More than three times a week   Attends Religious Services: More than 4 times per year   Active Member of [employer] or Organizations: Yes   Attends Music therapist: More than 4 times per year   Marital Status: Not on file    Tobacco Counseling Counseling given: Not Answered   Clinical Intake:  Pre-visit preparation completed: Yes  Pain : No/denies pain     Nutritional Status: BMI > 30  Obese Nutritional Risks: None Diabetes: Yes  How often do you need to have someone help you when you read instructions, pamphlets, or other written materials from your doctor or pharmacy?: 1 - Never What is the last grade level you completed in school?: 12th grade  Diabetic? Yes Nutrition Risk Assessment:  Has the patient had any N/V/D within the last 2 months?  No  Does the patient have any non-healing wounds?  No  Has the patient had any unintentional weight loss or weight gain?  No   Diabetes:  Is the patient diabetic?  Yes  If diabetic, was a CBG obtained today?  No  Did the patient bring in their glucometer from home?  No  How often do you monitor your CBG's? daily.   Financial Strains and Diabetes Management:  Are you having any financial strains with the device, your supplies or your medication? No .  Does the patient want to be seen by Chronic Care Management for management of their diabetes?  No  Would the patient like to be referred to a Nutritionist or  for Diabetic Management?  No   Diabetic Exams:  Diabetic Eye Exam: Completed 07/30/2020 Diabetic Foot Exam: Completed /12/2019   Interpreter Needed?: No  Information entered by :: NAllen LPN   Activities of Daily Living In your present state of health, do you have any difficulty performing the following activities: 12/31/2020 01/29/2020  Hearing? N N  Vision? N N  Difficulty concentrating or making decisions? N N  Walking or climbing stairs? N N  Dressing or bathing? N N  Doing errands, shopping? N N  Preparing Food and eating ? N N  Using the Toilet? N N  In the past six months, have you accidently leaked urine? N  N  Do you have problems with loss of bowel control? N N  Managing your Medications? N N  Managing your Finances? N N  Housekeeping or managing your Housekeeping? N N  Some recent data might be hidden    Patient Care Team: Glendale Chard, MD as PCP - General (Internal Medicine) Rex Kras, Claudette Stapler, RN as Case Manager Mayford Knife, [facility] (Pharmacist)  Indicate any recent Medical Services you may have received from other than [facility] providers in the past year (date may be approximate).     Assessment:   This is a routine wellness examination for Tobey.  Hearing/Vision screen Vision Screening - Comments:: Regular eye exams, [facility]  Dietary issues and exercise activities discussed: Current Exercise Habits: The patient does not participate in regular exercise at present   Goals Addressed             This Visit's Progress    Patient Stated       12/31/2020, eat healthier and exercise       Depression Screen PHQ 2/9 Scores 12/31/2020 01/29/2020 12/20/2018 08/27/2018 05/24/2018 12/21/2017  PHQ - 2 Score 0 0 0 0 0 0  PHQ- 9 Score - 0 0 - - -    Fall Risk Fall Risk  12/31/2020 01/29/2020 12/20/2018 12/20/2018 08/27/2018  Falls in the past year? 0 0 0 Exclusion - non ambulatory 0  Risk for fall due to : Medication side effect Medication side effect - Medication  side effect -  Follow up Falls evaluation completed;Education provided;Falls prevention discussed Falls evaluation completed;Education provided;Falls prevention discussed - Education provided;Falls evaluation completed;Falls prevention discussed -    FALL RISK PREVENTION PERTAINING TO THE HOME:  Any stairs in or around the home? No  If so, are there any without handrails? N/a Home free of loose throw rugs in walkways, pet beds, electrical cords, etc? Yes  Adequate lighting in your home to reduce risk of falls? Yes   ASSISTIVE DEVICES UTILIZED TO PREVENT FALLS:  Life alert? No  Use of a cane, walker or w/c? No  Grab bars in the bathroom? No  Shower chair or bench in shower? No  Elevated toilet seat or a handicapped toilet? No   TIMED UP AND GO:  Was the test performed? No .    Gait steady and fast without use of assistive device  Cognitive Function:     6CIT Screen 12/31/2020 01/29/2020 12/20/2018  What Year? 0 points 0 points 0 points  What month? 0 points 0 points 0 points  What time? 0 points 0 points 0 points  Count back from 20 0 points 0 points 0 points  Months in reverse 0 points 0 points 0 points  Repeat phrase 0 points 0 points 0 points  Total Score 0 0 0    Immunizations Immunization History  Administered Date(s) Administered   DTaP 06/17/2011   Fluad Quad(high Dose 65+) 03/19/2019, 01/29/2020   Influenza-Unspecified 02/06/2018   PFIZER(Purple Top)SARS-COV-2 Vaccination 07/08/2019, 07/30/2019, 05/10/2020   Pneumococcal Conjugate-13 05/07/2014   Pneumococcal Polysaccharide-23 02/28/2013   Pneumococcal-Unspecified 02/25/2013, 05/07/2015   Zoster, Live 04/30/2013    TDAP status: Up to date  Flu Vaccine status: Due, Education has been provided regarding the importance of this vaccine. Advised may receive this vaccine at local pharmacy or Health Dept. Aware to provide a copy of the vaccination record if obtained from local pharmacy or Health Dept. Verbalized  acceptance and understanding.  Pneumococcal vaccine status: Up to date  Covid-19 vaccine status:  Completed vaccines  Qualifies for Shingles Vaccine? Yes   Zostavax completed Yes   Shingrix Completed?: No.    Education has been provided regarding the importance of this vaccine. Patient has been advised to call insurance company to determine out of pocket expense if they have not yet received this vaccine. Advised may also receive vaccine at local pharmacy or Health Dept. Verbalized acceptance and understanding.  Screening Tests Health Maintenance  Topic Date Due   Zoster Vaccines- Shingrix (1 of 2) Never done   COVID-19 Vaccine (4 - Booster for Pfizer series) 08/08/2020   INFLUENZA VACCINE  12/21/2020   FOOT EXAM  01/28/2021   HEMOGLOBIN A1C  04/02/2021   TETANUS/TDAP  06/16/2021   OPHTHALMOLOGY EXAM  07/30/2021   MAMMOGRAM  02/06/2022   COLONOSCOPY (Pts 45-38yr Insurance coverage will need to be confirmed)  12/17/2025   DEXA SCAN  Completed   Hepatitis C Screening  Completed   PNA vac Low Risk Adult  Completed   HPV VACCINES  Aged Out    Health Maintenance  Health Maintenance Due  Topic Date Due   Zoster Vaccines- Shingrix (1 of 2) Never done   COVID-19 Vaccine (4 - Booster for Pfizer series) 08/08/2020   INFLUENZA VACCINE  12/21/2020    Colorectal cancer screening: Type of screening: Colonoscopy. Completed 12/18/2015. Repeat every 10 years  Mammogram status: Completed 02/07/2020. Repeat every year  Bone Density status: Completed 04/18/2018.   Lung Cancer Screening: (Low Dose CT Chest recommended if Age 73-80years, 30 pack-year currently smoking OR have quit w/in 15years.) does not qualify.   Lung Cancer Screening Referral: no  Additional Screening:  Hepatitis C Screening: does qualify; Completed 06/19/2012  Vision Screening: Recommended annual ophthalmology exams for early detection of glaucoma and other disorders of the eye. Is the patient up to date with their  annual eye exam?  Yes  Who is the provider or what is the name of the [facility] which the patient [facility]ends annual [facility] exams[facility] not [facility] a provider, would they like to be referred to a provider to establish care? No .   Dental Screening: Recommended annual dental exams for proper oral hygiene  Community Resource Referral / Chronic Care Management: CRR required this visit?  No   CCM required this visit?  No      Plan:     I have personally reviewed and noted the following in the patient's chart:   Medical and social history Use of alcohol, tobacco or illicit drugs  Current medications and supplements including opioid prescriptions.  Functional ability and status Nutritional status Physical activity Advanced directives List of other physicians Hospitalizations, surgeries, and ER visits in previous 12 months Vitals Screenings to include cognitive, depression, and falls Referrals and appointments  In addition, I have reviewed and discussed with patient certain preventive protocols, quality metrics, and best practice recommendations. A written personalized care plan for preventive services as well as general preventive health recommendations were provided to patient.     NKellie Simmering LPN   [id number]  Nurse Notes:

## 2020-12-31 NOTE — Patient Instructions (Signed)
Nicole Nunez , Thank you for taking time to come for your Medicare Wellness Visit. I appreciate your ongoing commitment to your health goals. Please review the following plan we discussed and let me know if I can assist you in the future.   Screening recommendations/referrals: Colonoscopy: completed 12/18/2015 Mammogram: completed 02/07/2020 Bone Density: completed 04/18/2018 Recommended yearly ophthalmology/optometry visit for glaucoma screening and checkup Recommended yearly dental visit for hygiene and checkup  Vaccinations: Influenza vaccine: due Pneumococcal vaccine: completed 05/07/2015 Tdap vaccine: completed 06/17/2011, due 06/16/2021 Shingles vaccine: discussed   Covid-19: 05/10/2020, 07/30/2019, 07/08/2019  Advanced directives: Advance directive discussed with you today. Even though you declined this today please call our office should you change your mind and we can give you the proper paperwork for you to fill out.  Conditions/risks identified: none  Next appointment: Follow up in one year for your annual wellness visit    Preventive Care 65 Years and Older, Female Preventive care refers to lifestyle choices and visits with your health care provider that can promote health and wellness. What does preventive care include? A yearly physical exam. This is also called an annual well check. Dental exams once or twice a year. Routine eye exams. Ask your health care provider how often you should have your eyes checked. Personal lifestyle choices, including: Daily care of your teeth and gums. Regular physical activity. Eating a healthy diet. Avoiding tobacco and drug use. Limiting alcohol use. Practicing safe sex. Taking low-dose aspirin every day. Taking vitamin and mineral supplements as recommended by your health care provider. What happens during an annual well check? The services and screenings done by your health care provider during your annual well check will depend on  your age, overall health, lifestyle risk factors, and family history of disease. Counseling  Your health care provider may ask you questions about your: Alcohol use. Tobacco use. Drug use. Emotional well-being. Home and relationship well-being. Sexual activity. Eating habits. History of falls. Memory and ability to understand (cognition). Work and work Statistician. Reproductive health. Screening  You may have the following tests or measurements: Height, weight, and BMI. Blood pressure. Lipid and cholesterol levels. These may be checked every 5 years, or more frequently if you are over 28 years old. Skin check. Lung cancer screening. You may have this screening every year starting at age 83 if you have a 30-pack-year history of smoking and currently smoke or have quit within the past 15 years. Fecal occult blood test (FOBT) of the stool. You may have this test every year starting at age 25. Flexible sigmoidoscopy or colonoscopy. You may have a sigmoidoscopy every 5 years or a colonoscopy every 10 years starting at age 1. Hepatitis C blood test. Hepatitis B blood test. Sexually transmitted disease (STD) testing. Diabetes screening. This is done by checking your blood sugar (glucose) after you have not eaten for a while (fasting). You may have this done every 1-3 years. Bone density scan. This is done to screen for osteoporosis. You may have this done starting at age 44. Mammogram. This may be done every 1-2 years. Talk to your health care provider about how often you should have regular mammograms. Talk with your health care provider about your test results, treatment options, and if necessary, the need for more tests. Vaccines  Your health care provider may recommend certain vaccines, such as: Influenza vaccine. This is recommended every year. Tetanus, diphtheria, and acellular pertussis (Tdap, Td) vaccine. You may need a Td booster every 10 years. Zoster  vaccine. You may need this  after age 1. Pneumococcal 13-valent conjugate (PCV13) vaccine. One dose is recommended after age 16. Pneumococcal polysaccharide (PPSV23) vaccine. One dose is recommended after age 41. Talk to your health care provider about which screenings and vaccines you need and how often you need them. This information is not intended to replace advice given to you by your health care provider. Make sure you discuss any questions you have with your health care provider. Document Released: 06/05/2015 Document Revised: 01/27/2016 Document Reviewed: 03/10/2015 Elsevier Interactive Patient Education  2017 [place] Prevention in the Home Falls can cause injuries. They can happen to people of all ages. There are many things you can do to make your home safe and to help prevent falls. What can I do on the outside of my home? Regularly fix the edges of walkways and driveways and fix any cracks. Remove anything that might make you trip as you walk through a door, such as a raised step or threshold. Trim any bushes or trees on the path to your home. Use bright outdoor lighting. Clear any walking paths of anything that might make someone trip, such as rocks or tools. Regularly check to see if handrails are loose or broken. Make sure that both sides of any steps have handrails. Any raised decks and porches should have guardrails on the edges. Have any leaves, snow, or ice cleared regularly. Use sand or salt on walking paths during winter. Clean up any spills in your garage right away. This includes oil or grease spills. What can I do in the bathroom? Use night lights. Install grab bars by the toilet and in the tub and shower. Do not use towel bars as grab bars. Use non-skid mats or decals in the tub or shower. If you need to sit down in the shower, use a plastic, non-slip stool. Keep the floor dry. Clean up any water that spills on the floor as soon as it happens. Remove soap buildup in the tub or  shower regularly. Attach bath mats securely with double-sided non-slip rug tape. Do not have throw rugs and other things on the floor that can make you trip. What can I do in the bedroom? Use night lights. Make sure that you have a light by your bed that is easy to reach. Do not use any sheets or blankets that are too big for your bed. They should not hang down onto the floor. Have a firm chair that has side arms. You can use this for support while you get dressed. Do not have throw rugs and other things on the floor that can make you trip. What can I do in the kitchen? Clean up any spills right away. Avoid walking on wet floors. Keep items that you use a lot in easy-to-reach places. If you need to reach something above you, use a strong step stool that has a grab bar. Keep electrical cords out of the way. Do not use floor polish or wax that makes floors slippery. If you must use wax, use non-skid floor wax. Do not have throw rugs and other things on the floor that can make you trip. What can I do with my stairs? Do not leave any items on the stairs. Make sure that there are handrails on both sides of the stairs and use them. Fix handrails that are broken or loose. Make sure that handrails are as long as the stairways. Check any carpeting to make sure that it  is firmly attached to the stairs. Fix any carpet that is loose or worn. Avoid having throw rugs at the top or bottom of the stairs. If you do have throw rugs, attach them to the floor with carpet tape. Make sure that you have a light switch at the top of the stairs and the bottom of the stairs. If you do not have them, ask someone to add them for you. What else can I do to help prevent falls? Wear shoes that: Do not have high heels. Have rubber bottoms. Are comfortable and fit you well. Are closed at the toe. Do not wear sandals. If you use a stepladder: Make sure that it is fully opened. Do not climb a closed stepladder. Make  sure that both sides of the stepladder are locked into place. Ask someone to hold it for you, if possible. Clearly mark and make sure that you can see: Any grab bars or handrails. First and last steps. Where the edge of each step is. Use tools that help you move around (mobility aids) if they are needed. These include: Canes. Walkers. Scooters. Crutches. Turn on the lights when you go into a dark area. Replace any light bulbs as soon as they burn out. Set up your furniture so you have a clear path. Avoid moving your furniture around. If any of your floors are uneven, fix them. If there are any pets around you, be aware of where they are. Review your medicines with your doctor. Some medicines can make you feel dizzy. This can increase your chance of falling. Ask your doctor what other things that you can do to help prevent falls. This information is not intended to replace advice given to you by your health care provider. Make sure you discuss any questions you have with your health care provider. Document Released: 03/05/2009 Document Revised: 10/15/2015 Document Reviewed: 06/13/2014 Elsevier Interactive Patient Education  2017 [employer].

## 2021-01-07 ENCOUNTER — Other Ambulatory Visit: Payer: Self-pay | Admitting: Internal Medicine

## 2021-01-07 ENCOUNTER — Telehealth: Payer: Self-pay

## 2021-01-07 NOTE — Chronic Care Management (AMB) (Signed)
Chronic Care Management Pharmacy Assistant   Name: Nicole Nunez  MRN: [id number] DOB: April 21, 1946   Reason for Encounter: Disease State/ Hypertension   Recent office visits:  12-31-2020 Kellie Simmering, LPN. Medicare wellness  Recent consult visits:  None  Hospital visits:  None in previous 6 months  Medications: Outpatient Encounter Medications as of 01/07/2021  Medication Sig   aspirin EC 81 MG tablet Take 81 mg by mouth daily.   b complex vitamins tablet Take 1 tablet by mouth daily.   Calcium Carbonate (CALCIUM 600 PO) Take 1 tablet by mouth daily.    Cholecalciferol (VITAMIN D3) 125 MCG (5000 UT) CAPS Take 1 capsule by mouth daily.    Coenzyme Q10 100 MG TABS Take 1 tablet by mouth daily.   Lancet Devices (ONE TOUCH DELICA LANCING DEV) MISC USE AS DIRECTED TO CHECK BLOOD SUGARS ONCE DAILY dx: e11.22   Lancets (ONETOUCH DELICA PLUS [id number]) MISC USE AS DIRECTED TO CHECK BLOOD SUGARS ONCE DAILY   magnesium gluconate (MAGONATE) 500 MG tablet Take 500 mg by mouth daily.   Multiple Vitamin (MULTIVITAMIN) tablet Take 1 tablet by mouth daily.   olmesartan (BENICAR) 20 MG tablet TAKE 1 TABLET BY MOUTH  DAILY   Omega-3 Fatty Acids (FISH OIL) 1000 MG CAPS Take 1 capsule by mouth daily.   ONETOUCH VERIO test strip USE AS DIRECTED TO CHECK  BLOOD SUGAR ONCE DAILY   pantoprazole (PROTONIX) 40 MG tablet TAKE 1 TABLET(40 MG) BY MOUTH DAILY   pioglitazone-metformin (ACTOPLUS MET) 15-500 MG tablet TAKE 1 TABLET BY MOUTH  TWICE DAILY   Pitavastatin Calcium (LIVALO) 4 MG TABS Take one tablet by mouth on Monday Wednesday AND Friday   Semaglutide,0.25 or 0.5MG/DOS, (OZEMPIC, 0.25 OR 0.5 MG/DOSE,) 2 MG/1.5ML SOPN INJECT SUBCUTANEOUSLY 0.5MG ONCE A WEEK ON THURSDAY   No facility-administered encounter medications on file as of 01/07/2021.  Reviewed chart prior to disease state call. Spoke with patient regarding BP  Recent Office Vitals: BP Readings from Last 3 Encounters:  12/31/20  (!) 118/58  09/30/20 132/70  06/02/20 118/76   Pulse Readings from Last 3 Encounters:  12/31/20 86  09/30/20 82  06/02/20 83    Wt Readings from Last 3 Encounters:  12/31/20 148 lb 3.2 oz (67.2 kg)  09/30/20 158 lb 12.8 oz (72 kg)  06/02/20 161 lb (73 kg)     Kidney Function Lab Results  Component Value Date/Time   CREATININE 1.26 (H) 09/30/2020 09:18 AM   CREATININE 1.31 (H) 06/02/2020 09:42 AM   GFRNONAA 41 (L) 06/02/2020 09:42 AM   GFRAA 47 (L) 06/02/2020 09:42 AM    BMP Latest Ref Rng & Units 09/30/2020 06/02/2020 01/29/2020  Glucose 65 - 99 mg/dL 108(H) 113(H) 91  BUN 8 - 27 mg/dL '18 21 23  ' Creatinine 0.57 - 1.00 mg/dL 1.26(H) 1.31(H) 1.09(H)  BUN/Creat Ratio 12 - '28 14 16 21  ' Sodium 134 - 144 mmol/L 143 137 141  Potassium 3.5 - 5.2 mmol/L 4.5 4.4 4.2  Chloride 96 - 106 mmol/L 104 101 103  CO2 20 - 29 mmol/L '26 24 26  ' Calcium 8.7 - 10.3 mg/dL 9.6 9.4 9.5     Adherence Review: Is the patient currently on ACE/ARB medication? Yes Does the patient have >5 day gap between last estimated fill dates? Yes  01-07-2021:1st attempt left VM 01-12-2021: 2nd attempt left VM 01-15-2021: 3rd attempt left VM  Care Gaps: Covid booster overdue Shingrix overdue Medicare wellness 02-10-2022  Star Rating Drugs:  Olmesartan 20 mg- Last filled 08/19/2020 for 90 day supply  Pioglitazone/Metformin 15-500 mg- Last filled 06/17/2020 for 90 day supply at [place] [facility] filled 10/18/18- PAP  [place] Pharmacist Assistant [phone]

## 2021-01-29 ENCOUNTER — Other Ambulatory Visit: Payer: Self-pay | Admitting: Internal Medicine

## 2021-01-29 DIAGNOSIS — Z1231 Encounter for screening mammogram for malignant neoplasm of breast: Secondary | ICD-10-CM

## 2021-02-03 ENCOUNTER — Encounter (HOSPITAL_BASED_OUTPATIENT_CLINIC_OR_DEPARTMENT_OTHER): Payer: Self-pay | Admitting: Internal Medicine

## 2021-02-03 ENCOUNTER — Other Ambulatory Visit: Payer: Self-pay

## 2021-02-03 ENCOUNTER — Ambulatory Visit (HOSPITAL_BASED_OUTPATIENT_CLINIC_OR_DEPARTMENT_OTHER): Payer: Medicare Other | Admitting: Internal Medicine

## 2021-02-03 VITALS — BP 130/72 | HR 87 | Ht 59.0 in | Wt 151.0 lb

## 2021-02-03 DIAGNOSIS — E7800 Pure hypercholesterolemia, unspecified: Secondary | ICD-10-CM | POA: Diagnosis not present

## 2021-02-03 DIAGNOSIS — E785 Hyperlipidemia, unspecified: Secondary | ICD-10-CM

## 2021-02-03 DIAGNOSIS — T466X5A Adverse effect of antihyperlipidemic and antiarteriosclerotic drugs, initial encounter: Secondary | ICD-10-CM | POA: Diagnosis not present

## 2021-02-03 DIAGNOSIS — E119 Type 2 diabetes mellitus without complications: Secondary | ICD-10-CM | POA: Diagnosis not present

## 2021-02-03 DIAGNOSIS — E559 Vitamin D deficiency, unspecified: Secondary | ICD-10-CM | POA: Diagnosis not present

## 2021-02-03 DIAGNOSIS — G720 Drug-induced myopathy: Secondary | ICD-10-CM

## 2021-02-03 MED ORDER — PITAVASTATIN MAGNESIUM 4 MG PO TABS: 4.0000 mg | ORAL_TABLET | ORAL | 3 refills | Status: DC

## 2021-02-03 NOTE — Progress Notes (Signed)
LIPID CLINIC CONSULT NOTE  Chief Complaint:  Manage dyslipidemia  Primary Care Physician: Glendale Chard, MD  Primary Cardiologist:  None  HPI:  Nicole Nunez is a 75 y.o. female who is being seen today for the evaluation of dyslipidemia at the request of Glendale Chard, MD. this is a pleasant 75 year old female kindly referred for evaluation and management of dyslipidemia by Dr. Baird Cancer.  She has a history of statin intolerance including rosuvastatin, atorvastatin, pravastatin and others.  In the past she is only been able to tolerate Livalo.  She has had been on 4 milligrams every day.  Previous cholesterol showed total 148, triglycerides 58, HDL 66 and LDL 70, which would be at target for her as a diabetic, however a subsequent lipid profile in January showed total cholesterol 185, triglycerides 68, HDL 64 and LDL 108.  She said she had not discontinue the medicine at that time however had significantly changed her diet and was not eating as healthy or exercising regularly.  She actually has run into issues with the cost of the medication.  She has been prescribed samples by her primary care provider however was noted to not be able to be sampled for ever.  She was therefore referred to the lipid clinic for options.  PMHx:  Past Medical History:  Diagnosis Date   CKD stage 3 due to type 2 diabetes mellitus (HCC)    DM (diabetes mellitus) ([place])    High cholesterol    HTN (hypertension)     Past Surgical History:  Procedure Laterality Date   CATARACT EXTRACTION, BILATERAL  01/2018   first was 12/2017   corn removal Bilateral 06/2017    FAMHx:  Family History  Problem Relation Age of Onset   Asthma Mother    Hypertension Mother    Alzheimer's disease Mother    Early death Father     SOCHx:   reports that she has never smoked. She has never used smokeless tobacco. She reports that she does not drink alcohol and does not use drugs.  ALLERGIES:  No Known  Allergies  ROS: Pertinent items noted in HPI and remainder of comprehensive ROS otherwise negative.  HOME MEDS: Current Outpatient Medications on File Prior to Visit  Medication Sig Dispense Refill   aspirin EC 81 MG tablet Take 81 mg by mouth daily.     b complex vitamins tablet Take 1 tablet by mouth daily.     Calcium Carbonate (CALCIUM 600 PO) Take 1 tablet by mouth daily.      Cholecalciferol (VITAMIN D3) 125 MCG (5000 UT) CAPS Take 1 capsule by mouth daily.      Coenzyme Q10 100 MG TABS Take 1 tablet by mouth daily.     Lancet Devices (ONE TOUCH DELICA LANCING DEV) MISC USE AS DIRECTED TO CHECK BLOOD SUGARS ONCE DAILY dx: e11.22 1 each 2   Lancets (ONETOUCH DELICA PLUS [id number]) MISC USE AS DIRECTED TO CHECK BLOOD SUGARS ONCE DAILY 100 each 11   magnesium gluconate (MAGONATE) 500 MG tablet Take 500 mg by mouth daily.     Multiple Vitamin (MULTIVITAMIN) tablet Take 1 tablet by mouth daily.     olmesartan (BENICAR) 20 MG tablet TAKE 1 TABLET BY MOUTH  DAILY 90 tablet 3   Omega-3 Fatty Acids (FISH OIL) 1000 MG CAPS Take 1 capsule by mouth daily.     ONETOUCH VERIO test strip USE AS DIRECTED TO CHECK  BLOOD SUGAR ONCE DAILY 100 strip 3   pantoprazole (  PROTONIX) 40 MG tablet TAKE 1 TABLET(40 MG) BY MOUTH DAILY 90 tablet 1   pioglitazone-metformin (ACTOPLUS MET) 15-500 MG tablet TAKE 1 TABLET BY MOUTH  TWICE DAILY 180 tablet 3   Pitavastatin Calcium (LIVALO) 4 MG TABS Take one tablet by mouth on Monday Wednesday AND Friday 60 tablet 3   Probiotic Product (PROBIOTIC PO) Take by mouth.     Semaglutide,0.25 or 0.5MG/DOS, (OZEMPIC, 0.25 OR 0.5 MG/DOSE,) 2 MG/1.5ML SOPN INJECT SUBCUTANEOUSLY 0.5MG ONCE A WEEK ON THURSDAY 1.5 mL 1   No current facility-administered medications on file prior to visit.    LABS/IMAGING: No results found for this or any previous visit (from the past 48 hour(s)). No results found.  LIPID PANEL:    Component Value Date/Time   CHOL 185 06/02/2020 0942   TRIG  68 06/02/2020 0942   HDL 64 06/02/2020 0942   CHOLHDL 2.9 06/02/2020 0942   LDLCALC 108 (H) 06/02/2020 0942    WEIGHTS: Wt Readings from Last 3 Encounters:  02/03/21 151 lb (68.5 kg)  12/31/20 148 lb 3.2 oz (67.2 kg)  09/30/20 158 lb 12.8 oz (72 kg)    VITALS: BP 130/72   Pulse 87   Ht 4' 11" (1.499 m)   Wt 151 lb (68.5 kg)   SpO2 97%   BMI 30.50 kg/m   EXAM: General appearance: alert and no distress Neck: no carotid bruit, no JVD, and thyroid not enlarged, symmetric, no tenderness/mass/nodules Lungs: clear to auscultation bilaterally Heart: regular rate and rhythm, S1, S2 normal, no murmur, click, rub or gallop Abdomen: soft, non-tender; bowel sounds normal; no masses,  no organomegaly Extremities: extremities normal, atraumatic, no cyanosis or edema Pulses: 2+ and symmetric Skin: Skin color, texture, turgor normal. No rashes or lesions Neurologic: Grossly normal Psych: Pleasant  EKG: Deferred  ASSESSMENT: Mixed dyslipidemia, goal LDL less than 70 Type 2 diabetes with moderate to high 10-year risk Vitamin D deficiency Statin intolerance-myalgias  PLAN: 1.   Nicole Nunez has a history of statin intolerance with myalgias to numerous statins.  She fortunately has been able to tolerate Livalo in the past.  She was more recently on 4 mg 3 times a week.  She is currently taking samples therefore she has not filled prescriptions as was suggested by [facility] and pharmacist.  The reason was the cost of the medication.  I we will go ahead and repeat her labs today fasting as she says she has made significant dietary changes since January and expects her cholesterol to be lower.  I would recommend we continue with pitavastatin - but consider pitavastatin magnesium (Zyptimag) -she has a history of low magnesium on supplementation anyhow and this may be a more cost effective alternative.  We will also provided her with information for the [place], however its not clear that  that is funded at this current time.  Other options for patient assistance are limited.  If she remains above target on this therapy, would consider adding ezetimibe 10 mg daily which would be an expensive.  Plan follow-up in about 3 months  Thanks again for the kind referral.  Pixie Casino, MD, [facility] [place] Director of the Advanced Lipid Disorders &  Cardiovascular Risk Reduction Clinic Diplomate of the American Board of Clinical Lipidology Attending Cardiologist  Direct Dial: [phone]  Fax: [phone]  Website:  [email or web address]  02/03/2021, 9:07 AM

## 2021-02-03 NOTE — Patient Instructions (Signed)
Medication Instructions:  STOP: LIVALO START: PITAVASTATIN MAGNESIUM '4mg'$  THREE TIMES WEEKLY- Monday, Wednesday, Friday  *If you need a refill on your cardiac medications before your next appointment, please call your pharmacy*  Lab Work: LIPID NMR PROFILE- Your provider has recommended lab work. Please have this collected at [facility] [place]. The lab is open 8:00 am - 4:30 pm. Please avoid 12:00p - 1:00p for lunch hour. You do not need an appointment. Please go to [address] [place], [place] [address]. This is in the Primary Care office on the 3rd floor, let them know you are there for blood work and they will direct you to the lab.  If you have labs (blood work) drawn today and your tests are completely normal, you will receive your results only by: [place] (if you have MyChart) OR A paper copy in the mail If you have any lab test that is abnormal or we need to change your treatment, we will call you to review the results.  Follow-Up: At [facility], you and your health needs are our priority.  As part of our continuing mission to provide you with exceptional heart care, we have created designated Provider Care Teams.  These Care Teams include your primary Cardiologist (physician) and Advanced Practice Providers (APPs -  Physician Assistants and Nurse Practitioners) who all work together to provide you with the care you need, when you need it.  We recommend signing up for the patient portal called "MyChart".  Sign up information is provided on this After Visit Summary.  MyChart is used to connect with patients for Virtual Visits (Telemedicine).  Patients are able to view lab/test results, encounter notes, upcoming appointments, etc.  Non-urgent messages can be sent to your provider as well.   To learn more about what you can do with MyChart, go to [email or web address].    Your next appointment:   3 month(s) LIPID   The format for your next  appointment:   In Person  Provider:   K. Mali Hilty, MD

## 2021-02-04 ENCOUNTER — Ambulatory Visit (INDEPENDENT_AMBULATORY_CARE_PROVIDER_SITE_OTHER): Payer: Medicare Other | Admitting: Internal Medicine

## 2021-02-04 ENCOUNTER — Ambulatory Visit: Payer: Medicare Other

## 2021-02-04 ENCOUNTER — Encounter: Payer: Self-pay | Admitting: Internal Medicine

## 2021-02-04 VITALS — BP 132/70 | HR 97 | Temp 99.0°F | Ht 59.0 in | Wt 150.4 lb

## 2021-02-04 DIAGNOSIS — E6609 Other obesity due to excess calories: Secondary | ICD-10-CM

## 2021-02-04 DIAGNOSIS — Z23 Encounter for immunization: Secondary | ICD-10-CM | POA: Diagnosis not present

## 2021-02-04 DIAGNOSIS — I129 Hypertensive chronic kidney disease with stage 1 through stage 4 chronic kidney disease, or unspecified chronic kidney disease: Secondary | ICD-10-CM | POA: Diagnosis not present

## 2021-02-04 DIAGNOSIS — N1831 Chronic kidney disease, stage 3a: Secondary | ICD-10-CM

## 2021-02-04 DIAGNOSIS — Z0000 Encounter for general adult medical examination without abnormal findings: Secondary | ICD-10-CM

## 2021-02-04 DIAGNOSIS — E7800 Pure hypercholesterolemia, unspecified: Secondary | ICD-10-CM

## 2021-02-04 DIAGNOSIS — E559 Vitamin D deficiency, unspecified: Secondary | ICD-10-CM

## 2021-02-04 DIAGNOSIS — E1122 Type 2 diabetes mellitus with diabetic chronic kidney disease: Secondary | ICD-10-CM | POA: Diagnosis not present

## 2021-02-04 DIAGNOSIS — T466X5A Adverse effect of antihyperlipidemic and antiarteriosclerotic drugs, initial encounter: Secondary | ICD-10-CM | POA: Insufficient documentation

## 2021-02-04 DIAGNOSIS — G720 Drug-induced myopathy: Secondary | ICD-10-CM | POA: Insufficient documentation

## 2021-02-04 LAB — POCT URINALYSIS DIPSTICK
Bilirubin, UA: NEGATIVE
Glucose, UA: NEGATIVE
Leukocytes, UA: NEGATIVE
Nitrite, UA: NEGATIVE
Protein, UA: NEGATIVE
Spec Grav, UA: 1.025 (ref 1.010–1.025)
Urobilinogen, UA: 0.2 E.U./dL
pH, UA: 5.5 (ref 5.0–8.0)

## 2021-02-04 LAB — POCT UA - MICROALBUMIN
Albumin/Creatinine Ratio, Urine, POC: 30
Creatinine, POC: 300 mg/dL
Microalbumin Ur, POC: 30 mg/L

## 2021-02-04 LAB — NMR, LIPOPROFILE
Cholesterol, Total: 153 mg/dL (ref 100–199)
HDL Particle Number: 37.6 umol/L (ref >=30.5)
HDL-C: 67 mg/dL (ref >39)
LDL Particle Number: 705 nmol/L (ref <1000)
LDL Size: 21.4 nm (ref >20.5)
LDL-C (NIH Calc): 75 mg/dL (ref 0–99)
LP-IR Score: 30 (ref <=45)
Small LDL Particle Number: 237 nmol/L (ref <=527)
Triglycerides: 53 mg/dL (ref 0–149)

## 2021-02-04 NOTE — Patient Instructions (Signed)
Health Maintenance, Female Adopting a healthy lifestyle and getting preventive care are important in promoting health and wellness. Ask your health care provider about: The right schedule for you to have regular tests and exams. Things you can do on your own to prevent diseases and keep yourself healthy. What should I know about diet, weight, and exercise? Eat a healthy diet  Eat a diet that includes plenty of vegetables, fruits, low-fat dairy products, and lean protein. Do not eat a lot of foods that are high in solid fats, added sugars, or sodium. Maintain a healthy weight Body mass index (BMI) is used to identify weight problems. It estimates body fat based on height and weight. Your health care provider can help determine your BMI and help you achieve or maintain a healthy weight. Get regular exercise Get regular exercise. This is one of the most important things you can do for your health. Most adults should: Exercise for at least 150 minutes each week. The exercise should increase your heart rate and make you sweat (moderate-intensity exercise). Do strengthening exercises at least twice a week. This is in addition to the moderate-intensity exercise. Spend less time sitting. Even light physical activity can be beneficial. Watch cholesterol and blood lipids Have your blood tested for lipids and cholesterol at 73 years of age, then have this test every 5 years. Have your cholesterol levels checked more often if: Your lipid or cholesterol levels are high. You are older than 73 years of age. You are at high risk for heart disease. What should I know about cancer screening? Depending on your health history and family history, you may need to have cancer screening at various ages. This may include screening for: Breast cancer. Cervical cancer. Colorectal cancer. Skin cancer. Lung cancer. What should I know about heart disease, diabetes, and high blood pressure? Blood pressure and heart  disease High blood pressure causes heart disease and increases the risk of stroke. This is more likely to develop in people who have high blood pressure readings, are of African descent, or are overweight. Have your blood pressure checked: Every 3-5 years if you are 18-39 years of age. Every year if you are 40 years old or older. Diabetes Have regular diabetes screenings. This checks your fasting blood sugar level. Have the screening done: Once every three years after age 40 if you are at a normal weight and have a low risk for diabetes. More often and at a younger age if you are overweight or have a high risk for diabetes. What should I know about preventing infection? Hepatitis B If you have a higher risk for hepatitis B, you should be screened for this virus. Talk with your health care provider to find out if you are at risk for hepatitis B infection. Hepatitis C Testing is recommended for: Everyone born from 1945 through 1965. Anyone with known risk factors for hepatitis C. Sexually transmitted infections (STIs) Get screened for STIs, including gonorrhea and chlamydia, if: You are sexually active and are younger than 73 years of age. You are older than 73 years of age and your health care provider tells you that you are at risk for this type of infection. Your sexual activity has changed since you were last screened, and you are at increased risk for chlamydia or gonorrhea. Ask your health care provider if you are at risk. Ask your health care provider about whether you are at high risk for HIV. Your health care provider may recommend a prescription medicine   to help prevent HIV infection. If you choose to take medicine to prevent HIV, you should first get tested for HIV. You should then be tested every 3 months for as long as you are taking the medicine. Pregnancy If you are about to stop having your period (premenopausal) and you may become pregnant, seek counseling before you get  pregnant. Take 400 to 800 micrograms (mcg) of folic acid every day if you become pregnant. Ask for birth control (contraception) if you want to prevent pregnancy. Osteoporosis and menopause Osteoporosis is a disease in which the bones lose minerals and strength with aging. This can result in bone fractures. If you are 65 years old or older, or if you are at risk for osteoporosis and fractures, ask your health care provider if you should: Be screened for bone loss. Take a calcium or vitamin D supplement to lower your risk of fractures. Be given hormone replacement therapy (HRT) to treat symptoms of menopause. Follow these instructions at home: Lifestyle Do not use any products that contain nicotine or tobacco, such as cigarettes, e-cigarettes, and chewing tobacco. If you need help quitting, ask your health care provider. Do not use street drugs. Do not share needles. Ask your health care provider for help if you need support or information about quitting drugs. Alcohol use Do not drink alcohol if: Your health care provider tells you not to drink. You are pregnant, may be pregnant, or are planning to become pregnant. If you drink alcohol: Limit how much you use to 0-1 drink a day. Limit intake if you are breastfeeding. Be aware of how much alcohol is in your drink. In the [place] one drink equals one 12 oz bottle of beer (355 mL), one 5 oz glass of wine (148 mL), or one 1 oz glass of hard liquor (44 mL). General instructions Schedule regular health, dental, and eye exams. Stay current with your vaccines. Tell your health care provider if: You often feel depressed. You have ever been abused or do not feel safe at home. Summary Adopting a healthy lifestyle and getting preventive care are important in promoting health and wellness. Follow your health care provider's instructions about healthy diet, exercising, and getting tested or screened for diseases. Follow your health care provider's  instructions on monitoring your cholesterol and blood pressure. This information is not intended to replace advice given to you by your health care provider. Make sure you discuss any questions you have with your health care provider. Document Revised: 07/17/2020 Document Reviewed: 05/02/2018 Elsevier Patient Education  2022 [employer]  

## 2021-02-04 NOTE — Progress Notes (Signed)
I,YAMILKA J Llittleton,acting as a Education administrator for Maximino Greenland, MD.,have documented all relevant documentation on the behalf of Maximino Greenland, MD,as directed by  Maximino Greenland, MD while in the presence of Maximino Greenland, MD.  This visit occurred during the SARS-CoV-2 public health emergency.  Safety protocols were in place, including screening questions prior to the visit, additional usage of staff PPE, and extensive cleaning of exam room while observing appropriate contact time as indicated for disinfecting solutions.  Subjective:     Patient ID: Nicole Nunez , female    DOB: [birth year] , 75 y.o.   MRN: [id number]   Chief Complaint  Patient presents with   Annual Exam   Diabetes   Hypertension    HPI  Patient here for hm. Patient stated she no longer goes to a GYN. Patient recently went to see Dr.Hilty at [facility].  She reports she is to continue with Livalo until she runs out. He is switching her to Zypitamag, another pitavastatin product.    Diabetes She presents for her follow-up diabetic visit. She has type 2 diabetes mellitus. Her disease course has been stable. There are no hypoglycemic associated symptoms. Pertinent negatives for diabetes include no blurred vision and no chest pain. There are no hypoglycemic complications. She is following a diabetic diet. She has not had a previous visit with a dietitian. She participates in exercise three times a week. An ACE inhibitor/angiotensin II receptor blocker is being taken. Eye exam is current.  Hypertension This is a chronic problem. The current episode started more than 1 year ago. The problem has been gradually improving since onset. The problem is controlled. Pertinent negatives include no blurred vision, chest pain, palpitations or shortness of breath. The current treatment provides moderate improvement. Hypertensive end-organ damage includes kidney disease.    Past Medical History:  Diagnosis Date   CKD stage 3 due to  type 2 diabetes mellitus (HCC)    DM (diabetes mellitus) ([place])    High cholesterol    HTN (hypertension)      Family History  Problem Relation Age of Onset   Asthma Mother    Hypertension Mother    Alzheimer's disease Mother    Early death Father      Current Outpatient Medications:    aspirin EC 81 MG tablet, Take 81 mg by mouth daily., Disp: , Rfl:    b complex vitamins tablet, Take 1 tablet by mouth daily., Disp: , Rfl:    Calcium Carbonate (CALCIUM 600 PO), Take 1 tablet by mouth daily. , Disp: , Rfl:    Cholecalciferol (VITAMIN D3) 125 MCG (5000 UT) CAPS, Take 1 capsule by mouth daily. , Disp: , Rfl:    Coenzyme Q10 100 MG TABS, Take 1 tablet by mouth daily., Disp: , Rfl:    Lancet Devices (ONE TOUCH DELICA LANCING DEV) MISC, USE AS DIRECTED TO CHECK BLOOD SUGARS ONCE DAILY dx: e11.22, Disp: 1 each, Rfl: 2   Lancets (ONETOUCH DELICA PLUS NWGNFA21H) MISC, USE AS DIRECTED TO CHECK BLOOD SUGARS ONCE DAILY, Disp: 100 each, Rfl: 11   magnesium gluconate (MAGONATE) 500 MG tablet, Take 500 mg by mouth daily., Disp: , Rfl:    Multiple Vitamin (MULTIVITAMIN) tablet, Take 1 tablet by mouth daily., Disp: , Rfl:    olmesartan (BENICAR) 20 MG tablet, TAKE 1 TABLET BY MOUTH  DAILY, Disp: 90 tablet, Rfl: 3   Omega-3 Fatty Acids (FISH OIL) 1000 MG CAPS, Take 1 capsule by mouth daily., Disp: ,  Rfl:    ONETOUCH VERIO test strip, USE AS DIRECTED TO CHECK  BLOOD SUGAR ONCE DAILY, Disp: 100 strip, Rfl: 3   pantoprazole (PROTONIX) 40 MG tablet, TAKE 1 TABLET(40 MG) BY MOUTH DAILY, Disp: 90 tablet, Rfl: 1   pioglitazone-metformin (ACTOPLUS MET) 15-500 MG tablet, TAKE 1 TABLET BY MOUTH  TWICE DAILY, Disp: 180 tablet, Rfl: 3   Pitavastatin Magnesium 4 MG TABS, Take 4 mg by mouth 3 (three) times a week. Take 16m on Monday, Wednesday, Friday, Disp: 12 tablet, Rfl: 3   Probiotic Product (PROBIOTIC PO), Take by mouth., Disp: , Rfl:    Semaglutide,0.25 or 0.5MG/DOS, (OZEMPIC, 0.25 OR 0.5 MG/DOSE,) 2 MG/1.5ML  SOPN, INJECT SUBCUTANEOUSLY 0.5MG ONCE A WEEK ON THURSDAY, Disp: 1.5 mL, Rfl: 1   Allergies  Allergen Reactions   Statins Other (See Comments)    Rosuvastatin, atorvastatin, pravastatin, simvastatin    The patient states she uses post menopausal status for birth control. Last LMP was No LMP recorded. Patient is postmenopausal.. Negative for Dysmenorrhea. Negative for: breast discharge, breast lump(s), breast pain and breast self exam. Associated symptoms include abnormal vaginal bleeding. Pertinent negatives include abnormal bleeding (hematology), anxiety, decreased libido, depression, difficulty falling sleep, dyspareunia, history of infertility, nocturia, sexual dysfunction, sleep disturbances, urinary incontinence, urinary urgency, vaginal discharge and vaginal itching. Diet regular.The patient states her exercise level is  intermittent.   . The patient's tobacco use is:  Social History   Tobacco Use  Smoking Status Never  Smokeless Tobacco Never  . She has been exposed to passive smoke. The patient's alcohol use is:  Social History   Substance and Sexual Activity  Alcohol Use Never   Review of Systems  Constitutional: Negative.   HENT: Negative.    Eyes: Negative.  Negative for blurred vision.  Respiratory: Negative.  Negative for shortness of breath.   Cardiovascular: Negative.  Negative for chest pain and palpitations.  Gastrointestinal: Negative.   Endocrine: Negative.   Genitourinary: Negative.   Musculoskeletal: Negative.   Skin: Negative.   Allergic/Immunologic: Negative.   Neurological: Negative.   Hematological: Negative.   Psychiatric/Behavioral: Negative.      Today's Vitals   02/04/21 0933  BP: 132/70  Pulse: 97  Temp: 99 F (37.2 C)  Weight: 150 lb 6.4 oz (68.2 kg)  Height: '4\' 11"'  (1.499 m)  PainSc: 0-No pain   Body mass index is 30.38 kg/m.  Wt Readings from Last 3 Encounters:  02/04/21 150 lb 6.4 oz (68.2 kg)  02/03/21 151 lb (68.5 kg)  12/31/20  148 lb 3.2 oz (67.2 kg)     Objective:  Physical Exam Vitals and nursing note reviewed.  Constitutional:      Appearance: Normal appearance.  HENT:     Head: Normocephalic and atraumatic.     Comments: B/l arcus senilis    Right Ear: Tympanic membrane, ear canal and external ear normal.     Left Ear: Tympanic membrane, ear canal and external ear normal.     Nose:     Comments: Masked     Mouth/Throat:     Comments: Masked  Eyes:     Extraocular Movements: Extraocular movements intact.     Conjunctiva/sclera: Conjunctivae normal.     Pupils: Pupils are equal, round, and reactive to light.  Cardiovascular:     Rate and Rhythm: Normal rate and regular rhythm.     Pulses: Normal pulses.          Dorsalis pedis pulses are 2+ on the right  side and 2+ on the left side.     Heart sounds: Normal heart sounds.  Pulmonary:     Effort: Pulmonary effort is normal.     Breath sounds: Normal breath sounds.  Chest:  Breasts:    Tanner Score is 5.     Right: Normal.     Left: Normal.  Abdominal:     General: Bowel sounds are normal.     Palpations: Abdomen is soft.  Genitourinary:    Comments: deferred Musculoskeletal:        General: Normal range of motion.     Cervical back: Normal range of motion and neck supple.  Feet:     Right foot:     Protective Sensation: 5 sites tested.  5 sites sensed.     Skin integrity: Dry skin present.     Toenail Condition: Right toenails are normal.     Left foot:     Protective Sensation: 5 sites tested.  5 sites sensed.     Skin integrity: Dry skin present.     Toenail Condition: Left toenails are normal.  Skin:    General: Skin is warm and dry.  Neurological:     General: No focal deficit present.     Mental Status: She is alert and oriented to person, place, and time.  Psychiatric:        Mood and Affect: Mood normal.        Behavior: Behavior normal.        Assessment And Plan:     1. Encounter for general adult medical examination  w/o abnormal findings Comments: A full exam was performed. Importance of monthly self breast exams was discussed with the patient. PATIENT IS ADVISED TO GET 30-45 MINUTES REGULAR EXERCISE NO LESS THAN FOUR TO FIVE DAYS PER WEEK - BOTH WEIGHTBEARING EXERCISES AND AEROBIC ARE RECOMMENDED.  PATIENT IS ADVISED TO FOLLOW A HEALTHY DIET WITH AT LEAST SIX FRUITS/VEGGIES PER DAY, DECREASE INTAKE OF RED MEAT, AND TO INCREASE FISH INTAKE TO TWO DAYS PER WEEK.  MEATS/FISH SHOULD NOT BE FRIED, BAKED OR BROILED IS PREFERABLE.  IT IS ALSO IMPORTANT TO CUT BACK ON YOUR SUGAR INTAKE. PLEASE AVOID ANYTHING WITH ADDED SUGAR, CORN SYRUP OR OTHER SWEETENERS. IF YOU MUST USE A SWEETENER, YOU CAN TRY STEVIA. IT IS ALSO IMPORTANT TO AVOID ARTIFICIALLY SWEETENERS AND DIET BEVERAGES. LASTLY, I SUGGEST WEARING SPF 50 SUNSCREEN ON EXPOSED PARTS AND ESPECIALLY WHEN IN THE DIRECT SUNLIGHT FOR AN EXTENDED PERIOD OF TIME.  PLEASE AVOID FAST FOOD RESTAURANTS AND INCREASE YOUR WATER INTAKE.   2. Type 2 diabetes mellitus with stage 3a chronic kidney disease, without long-term current use of insulin (HCC) Comments: Diabetic foot exam was performed. I will check labs as listed below. Encouraged to limit intake of sweetened beverages, diet drinks. I DISCUSSED WITH THE PATIENT AT LENGTH REGARDING THE GOALS OF GLYCEMIC CONTROL AND POSSIBLE LONG-TERM COMPLICATIONS.  I  ALSO STRESSED THE IMPORTANCE OF COMPLIANCE WITH HOME GLUCOSE MONITORING, DIETARY RESTRICTIONS INCLUDING AVOIDANCE OF SUGARY DRINKS/PROCESSED FOODS,  ALONG WITH REGULAR EXERCISE.  I  ALSO STRESSED THE IMPORTANCE OF ANNUAL EYE EXAMS, SELF FOOT CARE AND COMPLIANCE WITH OFFICE VISITS.  - POCT Urinalysis Dipstick (81002) - POCT UA - Microalbumin - CBC - Hemoglobin A1c  3. Parenchymal renal hypertension, stage 1 through stage 4 or unspecified chronic kidney disease Comments: Chronic, fair control. EKG performed, NSR w/o acute changes. Pt advised goal BP is less than 130/70.  Advised  to follow low sodium diet.  She will f/u in 4-6 months for re-evaluation.  - EKG 12-Lead - CMP14+EGFR  4. Pure hypercholesterolemia Chronic, now followed at [facility]. I appreciate the input of Dr. Debara Pickett. His note was reviewed during her visit.   5. Vitamin D deficiency disease Comments: I will check vitamin D level and supplement as needed.  - Vitamin D (25 hydroxy)  6. Immunization due Comments: She was given high dose flu vaccine.  - Flu Vaccine QUAD High Dose(Fluad)  7. Class 1 obesity due to excess calories with serious comorbidity and body mass index (BMI) of 30.0 to 30.9 in adult Comments: She is encouraged to strive to lose 5-7 pounds to decrease cardiac risk. Advised to aim for at least 150 minutes of exercise per week.    Patient was given opportunity to ask questions. Patient verbalized understanding of the plan and was able to repeat key elements of the plan. All questions were answered to their satisfaction.   I, Maximino Greenland, MD, have reviewed all documentation for this visit. The documentation on 02/04/21 for the exam, diagnosis, procedures, and orders are all accurate and complete.  THE PATIENT IS ENCOURAGED TO PRACTICE SOCIAL DISTANCING DUE TO THE COVID-19 PANDEMIC.

## 2021-02-05 ENCOUNTER — Encounter: Payer: Self-pay | Admitting: Internal Medicine

## 2021-02-05 LAB — CMP14+EGFR
ALT: 9 IU/L (ref 0–32)
AST: 20 IU/L (ref 0–40)
Albumin/Globulin Ratio: 1.8 (ref 1.2–2.2)
Albumin: 4.2 g/dL (ref 3.7–4.7)
Alkaline Phosphatase: 100 IU/L (ref 44–121)
BUN/Creatinine Ratio: 18 (ref 12–28)
BUN: 21 mg/dL (ref 8–27)
Bilirubin Total: 0.3 mg/dL (ref 0.0–1.2)
CO2: 24 mmol/L (ref 20–29)
Calcium: 9.3 mg/dL (ref 8.7–10.3)
Chloride: 105 mmol/L (ref 96–106)
Creatinine, Ser: 1.15 mg/dL — ABNORMAL HIGH (ref 0.57–1.00)
Globulin, Total: 2.4 g/dL (ref 1.5–4.5)
Glucose: 103 mg/dL — ABNORMAL HIGH (ref 65–99)
Potassium: 4.7 mmol/L (ref 3.5–5.2)
Sodium: 144 mmol/L (ref 134–144)
Total Protein: 6.6 g/dL (ref 6.0–8.5)
eGFR: 50 mL/min/{1.73_m2} — ABNORMAL LOW (ref >59)

## 2021-02-05 LAB — CBC
Hematocrit: 32.5 % — ABNORMAL LOW (ref 34.0–46.6)
Hemoglobin: 10.6 g/dL — ABNORMAL LOW (ref 11.1–15.9)
MCH: 27.4 pg (ref 26.6–33.0)
MCHC: 32.6 g/dL (ref 31.5–35.7)
MCV: 84 fL (ref 79–97)
Platelets: 304 10*3/uL (ref 150–450)
RBC: 3.87 x10E6/uL (ref 3.77–5.28)
RDW: 13.2 % (ref 11.7–15.4)
WBC: 6 10*3/uL (ref 3.4–10.8)

## 2021-02-05 LAB — HEMOGLOBIN A1C
Est. average glucose Bld gHb Est-mCnc: 146 mg/dL
Hgb A1c MFr Bld: 6.7 % — ABNORMAL HIGH (ref 4.8–5.6)

## 2021-02-05 LAB — VITAMIN D 25 HYDROXY (VIT D DEFICIENCY, FRACTURES): Vit D, 25-Hydroxy: 62.1 ng/mL (ref 30.0–100.0)

## 2021-02-15 ENCOUNTER — Telehealth: Payer: Self-pay | Admitting: Internal Medicine

## 2021-02-15 MED ORDER — PITAVASTATIN CALCIUM 4 MG PO TABS: ORAL_TABLET | ORAL | 3 refills | Status: DC

## 2021-02-15 NOTE — Telephone Encounter (Signed)
Patient was prescribed Zypitmag during last visit, in hopes of better cost-effectiveness. Her insurance does not cover this. Spoke with patient and relayed this info, advised MD re-prescribed pitavastatin calcium 4mg  daily (3x/week). She wants this sent to [facility] for 90 day supply. Advised her to apply for Medicare LIS - Extra Help for drug assistance

## 2021-03-01 ENCOUNTER — Telehealth: Payer: Self-pay

## 2021-03-01 NOTE — Chronic Care Management (AMB) (Signed)
03/01/2021 APPOINTMENT REMINDER   Nicole Nunez was reminded to have all medications, supplements and any blood glucose and blood pressure readings available for review with Orlando Penner, Pharm. D, at her telephone visit on 03/02/2021 at 8:30 AM.   [name], [place] Pharmacist Assistant [phone]

## 2021-03-02 ENCOUNTER — Ambulatory Visit (INDEPENDENT_AMBULATORY_CARE_PROVIDER_SITE_OTHER): Payer: Medicare Other

## 2021-03-02 DIAGNOSIS — E1122 Type 2 diabetes mellitus with diabetic chronic kidney disease: Secondary | ICD-10-CM

## 2021-03-02 DIAGNOSIS — N1831 Chronic kidney disease, stage 3a: Secondary | ICD-10-CM

## 2021-03-02 DIAGNOSIS — E7800 Pure hypercholesterolemia, unspecified: Secondary | ICD-10-CM

## 2021-03-02 NOTE — Patient Instructions (Addendum)
Visit Information It was great speaking with you today!  Please let me know if you have any questions about our visit.   Goals Addressed             This Visit's Progress    Manage My Medicine       Timeframe:  Long-Range Goal Priority:  High Start Date:                             Expected End Date:                       Follow Up Date 07/07/2021   - call for medicine refill 2 or 3 days before it runs out - call if I am sick and can't take my medicine - learn to read medicine labels - use a pillbox to sort medicine    Why is this important?   These steps will help you keep on track with your medicines.           Patient Care Plan: CCM Pharmacy Care Plan     Problem Identified: DM II, HLD   Priority: High     Long-Range Goal: Disease Management   Recent Progress: On track  Priority: High  Note:    Current Barriers:  Unable to achieve control of Cholesterol.    Pharmacist Clinical Goal(s):  Patient will verbalize ability to afford treatment regimen achieve adherence to monitoring guidelines and medication adherence to achieve therapeutic efficacy maintain control of cholesterol as evidenced by LDL <70  through collaboration with PharmD and provider.   Interventions: 1:1 collaboration with Glendale Chard, MD regarding development and update of comprehensive plan of care as evidenced by provider attestation and co-signature Inter-disciplinary care team collaboration (see longitudinal plan of care) Comprehensive medication review performed; medication list updated in electronic medical record   Diabetes (A1c goal <7%) -Controlled -Current medications: Ozempic 0.5 mg once a week on Thursday  Pioglitazone-Metformin - take 1 tablet by mouth twice daily  -Medications previously tried: none noted   -Current home glucose readings fasting glucose: 140, 120, 99, 100 -Denies hypoglycemic/hyperglycemic symptoms -Current meal patterns: she is eating smaller meals,  for the past two weeks she has increased the amount of food she is eating. She was eating more fast food.  drinks: water, increased her amount of water, she is drinking two bottles of water  -Current exercise habits: she is not exercising but she is very active around the house. She is doing some walking, and she would like to increase her walking.  -Educated on A1c and blood sugar goals; Complications of diabetes including kidney damage, retinal damage, and cardiovascular disease; -Counseled to check feet daily and get yearly eye exams -Recommended to continue current medication   Hyperlipidemia: (LDL goal < 70) -Controlled -Current treatment: Pitavastatin 4 mg tablet once per day on Monday, Wednesday and Friday.  Patient is able to receive medication through [facility] with no charge  -Medications previously tried: Pitavastatin 2 mg tablet, Lovastatin 40 mg tablet   -Current dietary patterns: avoiding fried and fatty foods.  -Current exercise habits: she is not exercising but she is very active around the house. She is doing some walking, and she would like to increase her walking.  -Per patient her LDL was checked on 9/15 by cardiologist. -Educated on Cholesterol goals;  Benefits of statin for ASCVD risk reduction; Importance of limiting foods high in cholesterol; Exercise  goal of 150 minutes per week; -Recommended to continue current medication  Health Maintenance -Vaccine gaps: COVID-19 Booster, Shingrix Vaccine   -Ms. Severns agreed to have her COVID -19 booster vaccine scheduled by me. Patient scheduled to receive COVID-19 booster vaccine on 03/12/2021 at [facility] at 2:20 pm.  -Collaborated with patient to schedule COVID-19 booster vaccine.   Patient Goals/Self-Care Activities Patient will:  - take medications as prescribed  Follow Up Plan: The patient has been provided with contact information for the care management team and has been advised to call with any health  related questions or concerns.       Patient agreed to services and verbal consent obtained.   The patient verbalized understanding of instructions, educational materials, and care plan provided today and agreed to receive a mailed copy of patient instructions, educational materials, and care plan.   Orlando Penner, PharmD Clinical Pharmacist [facility] [phone]

## 2021-03-02 NOTE — Progress Notes (Signed)
Chronic Care Management Pharmacy Note  03/02/2021 Name:  Nicole Nunez MRN:  [id number] DOB:  [birth year]  Summary: Patient reports that she has been doing well.    Recommendations/Changes made from today's visit: She is taking her pitavastatin on a consistent basis and her insurance co-pay is down to zero dollars.  Recommended patient receive COVID-19 booster vaccine.   Plan: Patient to receive COVID-19 booster vaccine at [facility] on [facility] on 03/12/2021 at 2:20 pm  confirmation number:  [id number]   Subjective: Nicole Nunez is an 75 y.o. year old female who is a primary patient of Glendale Chard, MD.  The CCM team was consulted for assistance with disease management and care coordination needs.    Engaged with patient by telephone for follow up visit in response to provider referral for pharmacy case management and/or care coordination services. She had to cut back from eating because her teeth were hurting and she did not feel like cooking. She has podiatry visit on 03/17/2021.   Consent to Services:  The patient was given information about Chronic Care Management services, agreed to services, and gave verbal consent prior to initiation of services.  Please see initial visit note for detailed documentation.   Patient Care Team: Glendale Chard, MD as PCP - General (Internal Medicine) Rex Kras Claudette Stapler, RN as Case Manager Mayford Knife, [facility] (Pharmacist)  Recent office visits: 02/04/2021 PCP OV   Recent consult visits: 02/03/2021 Cardiology [facility] visits: None in previous 6 months   Objective:  Lab Results  Component Value Date   CREATININE 1.15 (H) 02/04/2021   BUN 21 02/04/2021   GFRNONAA 41 (L) 06/02/2020   GFRAA 47 (L) 06/02/2020   NA 144 02/04/2021   K 4.7 02/04/2021   CALCIUM 9.3 02/04/2021   CO2 24 02/04/2021   GLUCOSE 103 (H) 02/04/2021    Lab Results  Component Value Date/Time   HGBA1C 6.7 (H) 02/04/2021 10:24 AM    HGBA1C 6.3 (H) 09/30/2020 09:18 AM   MICROALBUR 30 02/04/2021 10:00 AM   MICROALBUR 10 01/29/2020 12:51 PM    Last diabetic Eye exam:  Lab Results  Component Value Date/Time   HMDIABEYEEXA Retinopathy (A) 07/30/2020 12:00 AM    Last diabetic Foot exam: No results found for: HMDIABFOOTEX   Lab Results  Component Value Date   CHOL 185 06/02/2020   HDL 64 06/02/2020   LDLCALC 108 (H) 06/02/2020   TRIG 68 06/02/2020   CHOLHDL 2.9 06/02/2020    Hepatic Function Latest Ref Rng & Units 02/04/2021 06/02/2020 01/29/2020  Total Protein 6.0 - 8.5 g/dL 6.6 6.9 7.0  Albumin 3.7 - 4.7 g/dL 4.2 4.3 4.6  AST 0 - 40 IU/L '20 20 22  ' ALT 0 - 32 IU/L '9 15 15  ' Alk Phosphatase 44 - 121 IU/L 100 98 98  Total Bilirubin 0.0 - 1.2 mg/dL 0.3 0.5 0.5    No results found for: TSH, FREET4  CBC Latest Ref Rng & Units 02/04/2021 01/29/2020 12/20/2018  WBC 3.4 - 10.8 x10E3/uL 6.0 5.7 5.0  Hemoglobin 11.1 - 15.9 g/dL 10.6(L) 11.1 11.2  Hematocrit 34.0 - 46.6 % 32.5(L) 34.0 34.8  Platelets 150 - 450 x10E3/uL 304 321 311    Lab Results  Component Value Date/Time   VD25OH 62.1 02/04/2021 10:24 AM   VD25OH 65.4 01/29/2020 02:42 PM    Clinical ASCVD: No  The 10-year ASCVD risk score (Arnett DK, et al., 2019) is: 26.4%   Values used to  calculate the score:     Age: 75 years     Sex: Female     Is Non-Hispanic African American: Yes     Diabetic: Yes     Tobacco smoker: No     Systolic Blood Pressure: 696 mmHg     Is BP treated: Yes     HDL Cholesterol: 64 mg/dL     Total Cholesterol: 153 mg/dL    Depression screen [facility] 2/9 12/31/2020 01/29/2020 12/20/2018  Decreased Interest 0 0 0  Down, Depressed, Hopeless 0 0 0  PHQ - 2 Score 0 0 0  Altered sleeping - 0 0  Tired, decreased energy - 0 0  Change in appetite - 0 0  Feeling bad or failure about yourself  - 0 0  Trouble concentrating - 0 0  Moving slowly or fidgety/restless - 0 0  Suicidal thoughts - 0 0  PHQ-9 Score - 0 0  Difficult doing work/chores  - Not difficult at all -     Social History   Tobacco Use  Smoking Status Never  Smokeless Tobacco Never   BP Readings from Last 3 Encounters:  02/04/21 132/70  02/03/21 130/72  12/31/20 (!) 118/58   Pulse Readings from Last 3 Encounters:  02/04/21 97  02/03/21 87  12/31/20 86   Wt Readings from Last 3 Encounters:  02/04/21 150 lb 6.4 oz (68.2 kg)  02/03/21 151 lb (68.5 kg)  12/31/20 148 lb 3.2 oz (67.2 kg)   BMI Readings from Last 3 Encounters:  02/04/21 30.38 kg/m  02/03/21 30.50 kg/m  12/31/20 30.97 kg/m    Assessment/Interventions: Review of patient past medical history, allergies, medications, health status, including review of consultants reports, laboratory and other test data, was performed as part of comprehensive evaluation and provision of chronic care management services.   SDOH:  (Social Determinants of Health) assessments and interventions performed: Yes  SDOH Screenings   Alcohol Screen: Not on file  Depression (PHQ2-9): Low Risk    PHQ-2 Score: 0  Financial Resource Strain: Low Risk    Difficulty of Paying Living Expenses: Not hard at all  Food Insecurity: No Food Insecurity   Worried About Charity fundraiser in the Last Year: Never true   Ran Out of Food in the Last Year: Never true  Housing: Not on file  Physical Activity: Inactive   Days of Exercise per Week: 0 days   Minutes of Exercise per Session: 0 min  Social Connections: Unknown   Frequency of Communication with Friends and Family: More than three times a week   Frequency of Social Gatherings with Friends and Family: More than three times a week   Attends Religious Services: More than 4 times per year   Active Member of [employer] or Organizations: Yes   Attends Music therapist: More than 4 times per year   Marital Status: Not on file  Stress: No Stress Concern Present   Feeling of Stress : Not at all  Tobacco Use: Low Risk    Smoking Tobacco Use: Never   Smokeless  Tobacco Use: Never  Transportation Needs: No Transportation Needs   Lack of Transportation (Medical): No   Lack of Transportation (Non-Medical): No    CCM Care Plan  Allergies  Allergen Reactions   Statins Other (See Comments)    Rosuvastatin, atorvastatin, pravastatin, simvastatin    Medications Reviewed Today     Reviewed by Mayford Knife, [facility] (Pharmacist) on 03/02/21 at [place]  Med List Status: <  None>   Medication Order Taking? Sig Documenting Provider Last Dose Status Informant  aspirin EC 81 MG tablet [id number] Yes Take 81 mg by mouth daily. [provider] Taking Active   b complex vitamins tablet [id number] Yes Take 1 tablet by mouth daily. [provider] Taking Active Self  Calcium Carbonate (CALCIUM 600 PO) [id number] Yes Take 1 tablet by mouth daily.  [provider] Taking Active   Cholecalciferol (VITAMIN D3) 125 MCG (5000 UT) CAPS [id number] Yes Take 1 capsule by mouth daily.  [provider] Taking Active   Coenzyme Q10 100 MG TABS [id number] Yes Take 1 tablet by mouth daily. [provider] Taking Active   Lancet Devices (ONE TOUCH DELICA LANCING DEV) MISC [id number] Yes USE AS DIRECTED TO CHECK BLOOD SUGARS ONCE DAILY dx: e11.22 Glendale Chard, MD Taking Active   Lancets (ONETOUCH DELICA PLUS [id number]) [place] [id number] Yes USE AS DIRECTED TO CHECK BLOOD SUGARS ONCE DAILY Glendale Chard, MD Taking Active   magnesium gluconate (MAGONATE) 500 MG tablet [id number] Yes Take 500 mg by mouth daily. [provider] Taking Active Self  Multiple Vitamin (MULTIVITAMIN) tablet [id number] Yes Take 1 tablet by mouth daily. [provider] Taking Active Self  olmesartan (BENICAR) 20 MG tablet [id number] Yes TAKE 1 TABLET BY MOUTH  DAILY Glendale Chard, MD Taking Active   Omega-3 Fatty Acids (FISH OIL) 1000 MG CAPS [id number] Yes Take 1 capsule by mouth daily. [provider] Taking Active Self  Roma Schanz test strip  [id number] Yes USE AS DIRECTED TO CHECK  BLOOD SUGAR ONCE DAILY Glendale Chard, MD Taking Active   pantoprazole (PROTONIX) 40 MG tablet [id number] Yes TAKE 1 TABLET(40 MG) BY MOUTH DAILY Glendale Chard, MD Taking Active   pioglitazone-metformin (ACTOPLUS MET) 15-500 MG tablet [id number] Yes TAKE 1 TABLET BY MOUTH  TWICE DAILY Glendale Chard, MD Taking Active   Pitavastatin Calcium 4 MG TABS [id number] Yes Take 1 tablet by mouth on Monday, Wednesday Friday Pixie Casino, MD Taking Active   Probiotic Product (PROBIOTIC PO) [id number] Yes Take by mouth. [provider] Taking Active   Semaglutide,0.25 or 0.5MG/DOS, (OZEMPIC, 0.25 OR 0.5 MG/DOSE,) 2 MG/1.5ML SOPN [id number] Yes INJECT SUBCUTANEOUSLY 0.5MG ONCE A WEEK ON Alfredo Bach, MD Taking Active             Patient Active Problem List   Diagnosis Date Noted   Statin myopathy 02/04/2021   Notalgia 01/29/2020   Vitamin D deficiency disease 01/29/2020   Class 1 obesity due to excess calories with serious comorbidity and body mass index (BMI) of 32.0 to 32.9 in adult 01/29/2020   Type 2 diabetes mellitus with stage 3 chronic kidney disease, without long-term current use of insulin ([place]) 05/24/2018   Chronic renal disease, stage III ([place]) 05/24/2018   Parenchymal renal hypertension 05/24/2018   Pure hypercholesterolemia 05/24/2018   Class 1 obesity due to excess calories with serious comorbidity and body mass index (BMI) of 31.0 to 31.9 in adult 05/24/2018    Immunization History  Administered Date(s) Administered   DTaP 06/17/2011   Fluad Quad(high Dose 65+) 03/19/2019, 01/29/2020, 02/04/2021   Influenza-Unspecified 02/06/2018   PFIZER(Purple Top)SARS-COV-2 Vaccination 07/08/2019, 07/30/2019, 05/10/2020   Pneumococcal Conjugate-13 05/07/2014   Pneumococcal Polysaccharide-23 02/28/2013   Pneumococcal-Unspecified 02/25/2013, 05/07/2015   Zoster, Live 04/30/2013    Conditions to be addressed/monitored:   Hyperlipidemia and Diabetes  Care Plan : [place]  Updates made by Mayford Knife, [place] since 03/02/2021 12:00 AM  Problem: DM II, HLD   Priority: High     Long-Range Goal: Disease Management   Recent Progress: On track  Priority: High  Note:    Current Barriers:  Unable to achieve control of Cholesterol.    Pharmacist Clinical Goal(s):  Patient will verbalize ability to afford treatment regimen achieve adherence to monitoring guidelines and medication adherence to achieve therapeutic efficacy maintain control of cholesterol as evidenced by LDL <70  through collaboration with PharmD and provider.   Interventions: 1:1 collaboration with Glendale Chard, MD regarding development and update of comprehensive plan of care as evidenced by provider attestation and co-signature Inter-disciplinary care team collaboration (see longitudinal plan of care) Comprehensive medication review performed; medication list updated in electronic medical record   Diabetes (A1c goal <7%) -Controlled -Current medications: Ozempic 0.5 mg once a week on Thursday  Pioglitazone-Metformin - take 1 tablet by mouth twice daily  -Medications previously tried: none noted   -Current home glucose readings fasting glucose: 140, 120, 99, 100 -Denies hypoglycemic/hyperglycemic symptoms -Current meal patterns: she is eating smaller meals, for the past two weeks she has increased the amount of food she is eating. She was eating more fast food.  drinks: water, increased her amount of water, she is drinking two bottles of water  -Current exercise habits: she is not exercising but she is very active around the house. She is doing some walking, and she would like to increase her walking.  -Educated on A1c and blood sugar goals; Complications of diabetes including kidney damage, retinal damage, and cardiovascular disease; -Counseled to check feet daily and get yearly eye exams -Recommended to  continue current medication   Hyperlipidemia: (LDL goal < 70) -Controlled -Current treatment: Pitavastatin 4 mg tablet once per day on Monday, Wednesday and Friday.  Patient is able to receive medication through [facility] with no charge  -Medications previously tried: Pitavastatin 2 mg tablet, Lovastatin 40 mg tablet   -Current dietary patterns: avoiding fried and fatty foods.  -Current exercise habits: she is not exercising but she is very active around the house. She is doing some walking, and she would like to increase her walking.  -Per patient her LDL was checked on 9/15 by cardiologist. -Educated on Cholesterol goals;  Benefits of statin for ASCVD risk reduction; Importance of limiting foods high in cholesterol; Exercise goal of 150 minutes per week; -Recommended to continue current medication  Health Maintenance -Vaccine gaps: COVID-19 Booster, Shingrix Vaccine   -Ms. Choma agreed to have her COVID -19 booster vaccine scheduled by me. Patient scheduled to receive COVID-19 booster vaccine on 03/12/2021 at [facility] at 2:20 pm.  -Collaborated with patient to schedule COVID-19 booster vaccine.   Patient Goals/Self-Care Activities Patient will:  - take medications as prescribed  Follow Up Plan: The patient has been provided with contact information for the care management team and has been advised to call with any health related questions or concerns.       Medication Assistance: None required.  Patient affirms current coverage meets needs.  Compliance/Adherence/Medication fill history: Care Gaps: Shingrix Vaccine  COVID-19 Vaccine booster  Star-Rating Drugs: Pitavastatin 20   Patient's preferred pharmacy is:  [facility] - [place], [place] [address] [place] [address] Phone: [phone] Fax: [place], [place] - [place]  [facility] [place] [address] Phone: [phone] Fax: [phone]  Uses  pill box? Yes Pt endorses 95% compliance  We discussed: Benefits of medication synchronization, packaging and delivery as well as enhanced pharmacist oversight with [facility]. Patient decided to: Continue current medication management strategy  Care Plan and Follow Up Patient Decision:  Patient agrees to Care Plan and Follow-up.  Plan: The patient has been provided with contact information for the care management team and has been advised to call with any health related questions or concerns.   Orlando Penner, PharmD Clinical Pharmacist [facility] [phone]

## 2021-03-09 ENCOUNTER — Other Ambulatory Visit: Payer: Self-pay

## 2021-03-09 ENCOUNTER — Ambulatory Visit
Admission: RE | Admit: 2021-03-09 | Discharge: 2021-03-09 | Disposition: A | Discharge status: Home / Self Care | Payer: Medicare Other | Source: Ambulatory Visit | Attending: Internal Medicine | Admitting: Internal Medicine

## 2021-03-09 DIAGNOSIS — Z1231 Encounter for screening mammogram for malignant neoplasm of breast: Secondary | ICD-10-CM

## 2021-03-17 DIAGNOSIS — L84 Corns and callosities: Secondary | ICD-10-CM | POA: Diagnosis not present

## 2021-03-17 DIAGNOSIS — B351 Tinea unguium: Secondary | ICD-10-CM | POA: Diagnosis not present

## 2021-03-17 DIAGNOSIS — E118 Type 2 diabetes mellitus with unspecified complications: Secondary | ICD-10-CM | POA: Diagnosis not present

## 2021-03-22 ENCOUNTER — Telehealth: Payer: Self-pay

## 2021-03-22 DIAGNOSIS — E1122 Type 2 diabetes mellitus with diabetic chronic kidney disease: Secondary | ICD-10-CM | POA: Diagnosis not present

## 2021-03-22 DIAGNOSIS — E7800 Pure hypercholesterolemia, unspecified: Secondary | ICD-10-CM

## 2021-03-22 DIAGNOSIS — N1831 Chronic kidney disease, stage 3a: Secondary | ICD-10-CM | POA: Diagnosis not present

## 2021-03-22 NOTE — Chronic Care Management (AMB) (Signed)
Chronic Care Management Pharmacy Assistant   Name: MARNESHA GAGEN  MRN: [id number] DOB: [birth year]  Reason for Encounter: Adherence report review   Medications: Outpatient Encounter Medications as of 03/22/2021  Medication Sig Note   aspirin EC 81 MG tablet Take 81 mg by mouth daily.    b complex vitamins tablet Take 1 tablet by mouth daily.    Calcium Carbonate (CALCIUM 600 PO) Take 1 tablet by mouth daily.     Cholecalciferol (VITAMIN D3) 125 MCG (5000 UT) CAPS Take 1 capsule by mouth daily.     Coenzyme Q10 100 MG TABS Take 1 tablet by mouth daily.    Lancet Devices (ONE TOUCH DELICA LANCING DEV) MISC USE AS DIRECTED TO CHECK BLOOD SUGARS ONCE DAILY dx: e11.22    Lancets (ONETOUCH DELICA PLUS [id number]) MISC USE AS DIRECTED TO CHECK BLOOD SUGARS ONCE DAILY    magnesium gluconate (MAGONATE) 500 MG tablet Take 500 mg by mouth daily.    Multiple Vitamin (MULTIVITAMIN) tablet Take 1 tablet by mouth daily.    olmesartan (BENICAR) 20 MG tablet TAKE 1 TABLET BY MOUTH  DAILY    Omega-3 Fatty Acids (FISH OIL) 1000 MG CAPS Take 1 capsule by mouth daily.    ONETOUCH VERIO test strip USE AS DIRECTED TO CHECK  BLOOD SUGAR ONCE DAILY    pantoprazole (PROTONIX) 40 MG tablet TAKE 1 TABLET(40 MG) BY MOUTH DAILY    pioglitazone-metformin (ACTOPLUS MET) 15-500 MG tablet TAKE 1 TABLET BY MOUTH  TWICE DAILY    Pitavastatin Calcium 4 MG TABS Take 1 tablet by mouth on Monday, Wednesday Friday    Probiotic Product (PROBIOTIC PO) Take by mouth.    Semaglutide,0.25 or 0.5MG/DOS, (OZEMPIC, 0.25 OR 0.5 MG/DOSE,) 2 MG/1.5ML SOPN INJECT SUBCUTANEOUSLY 0.5MG ONCE A WEEK ON THURSDAY 03/02/2021: Once a day on Fridays.    No facility-administered encounter medications on file as of 03/22/2021.   03-22-21: AWV 12-31-20, Last office visit 02-04-21 132/70 A1C 6.7  [place] Clinical Pharmacist Assistant [phone]

## 2021-03-23 ENCOUNTER — Other Ambulatory Visit: Payer: Self-pay

## 2021-03-23 MED ORDER — ONETOUCH DELICA PLUS LANCET33G MISC: 11 refills | Status: DC

## 2021-04-01 ENCOUNTER — Ambulatory Visit: Payer: Medicare Other

## 2021-04-23 ENCOUNTER — Other Ambulatory Visit: Payer: Self-pay | Admitting: Internal Medicine

## 2021-05-28 ENCOUNTER — Other Ambulatory Visit: Payer: Self-pay | Admitting: Internal Medicine

## 2021-05-28 DIAGNOSIS — E785 Hyperlipidemia, unspecified: Secondary | ICD-10-CM

## 2021-06-01 ENCOUNTER — Telehealth: Payer: Self-pay

## 2021-06-01 NOTE — Progress Notes (Addendum)
Chronic Care Management Pharmacy Assistant   Name: Nicole Nunez  MRN: [id number] DOB: Sep 26, 1945   Reason for Encounter: Disease State/Hypertension Adherence Call    Recent office visits: None  Recent consult visits: None  Hospital visits:  None in previous 6 months  Medications: Outpatient Encounter Medications as of 06/01/2021  Medication Sig Note   aspirin EC 81 MG tablet Take 81 mg by mouth daily.    b complex vitamins tablet Take 1 tablet by mouth daily.    Calcium Carbonate (CALCIUM 600 PO) Take 1 tablet by mouth daily.     Cholecalciferol (VITAMIN D3) 125 MCG (5000 UT) CAPS Take 1 capsule by mouth daily.     Coenzyme Q10 100 MG TABS Take 1 tablet by mouth daily.    Lancet Devices (ONE TOUCH DELICA LANCING DEV) MISC USE AS DIRECTED TO CHECK BLOOD SUGARS ONCE DAILY dx: e11.22    Lancets (ONETOUCH DELICA PLUS [id number]) MISC USE AS DIRECTED TO CHECK BLOOD SUGARS ONCE DAILY    magnesium gluconate (MAGONATE) 500 MG tablet Take 500 mg by mouth daily.    Multiple Vitamin (MULTIVITAMIN) tablet Take 1 tablet by mouth daily.    olmesartan (BENICAR) 20 MG tablet TAKE 1 TABLET BY MOUTH  DAILY    Omega-3 Fatty Acids (FISH OIL) 1000 MG CAPS Take 1 capsule by mouth daily.    ONETOUCH VERIO test strip USE AS DIRECTED TO CHECK  BLOOD SUGAR ONCE DAILY    pantoprazole (PROTONIX) 40 MG tablet TAKE 1 TABLET(40 MG) BY MOUTH DAILY    pioglitazone-metformin (ACTOPLUS MET) 15-500 MG tablet TAKE 1 TABLET BY MOUTH  TWICE DAILY    Pitavastatin Calcium 4 MG TABS Take 1 tablet by mouth on Monday, Wednesday Friday    Probiotic Product (PROBIOTIC PO) Take by mouth.    Semaglutide,0.25 or 0.5MG/DOS, (OZEMPIC, 0.25 OR 0.5 MG/DOSE,) 2 MG/1.5ML SOPN INJECT SUBCUTANEOUSLY 0.5MG ONCE A WEEK ON THURSDAY 03/02/2021: Once a day on Fridays.    No facility-administered encounter medications on file as of 06/01/2021.    Recent Office Vitals: BP Readings from Last 3 Encounters:  02/04/21 132/70  02/03/21  130/72  12/31/20 (!) 118/58   Pulse Readings from Last 3 Encounters:  02/04/21 97  02/03/21 87  12/31/20 86    Wt Readings from Last 3 Encounters:  02/04/21 150 lb 6.4 oz (68.2 kg)  02/03/21 151 lb (68.5 kg)  12/31/20 148 lb 3.2 oz (67.2 kg)     Kidney Function Lab Results  Component Value Date/Time   CREATININE 1.15 (H) 02/04/2021 10:24 AM   CREATININE 1.26 (H) 09/30/2020 09:18 AM   GFRNONAA 41 (L) 06/02/2020 09:42 AM   GFRAA 47 (L) 06/02/2020 09:42 AM    BMP Latest Ref Rng & Units 02/04/2021 09/30/2020 06/02/2020  Glucose 65 - 99 mg/dL 103(H) 108(H) 113(H)  BUN 8 - 27 mg/dL _0 Creatinine 0.57 - 1.00 mg/dL 1.15(H) 1.26(H) 1.31(H)  BUN/Creat Ratio 12 - _1 Sodium 134 - 144 mmol/L 144 143 137  Potassium 3.5 - 5.2 mmol/L 4.7 4.5 4.4  Chloride 96 - 106 mmol/L 105 104 101  CO2 20 - 29 mmol/L _2 Calcium 8.7 - 10.3 mg/dL 9.3 9.6 9.4     Recent Office Vitals: BP Readings from Last 3 Encounters:  02/04/21 132/70  02/03/21 130/72  12/31/20 (!) 118/58   Pulse Readings from Last 3 Encounters:  02/04/21 97  02/03/21 87  12/31/20 86  Wt Readings from Last 3 Encounters:  02/04/21 150 lb 6.4 oz (68.2 kg)  02/03/21 151 lb (68.5 kg)  12/31/20 148 lb 3.2 oz (67.2 kg)     Kidney Function Lab Results  Component Value Date/Time   CREATININE 1.15 (H) 02/04/2021 10:24 AM   CREATININE 1.26 (H) 09/30/2020 09:18 AM   GFRNONAA 41 (L) 06/02/2020 09:42 AM   GFRAA 47 (L) 06/02/2020 09:42 AM    BMP Latest Ref Rng & Units 02/04/2021 09/30/2020 06/02/2020  Glucose 65 - 99 mg/dL 103(H) 108(H) 113(H)  BUN 8 - 27 mg/dL _0 Creatinine 0.57 - 1.00 mg/dL 1.15(H) 1.26(H) 1.31(H)  BUN/Creat Ratio 12 - _1 Sodium 134 - 144 mmol/L 144 143 137  Potassium 3.5 - 5.2 mmol/L 4.7 4.5 4.4  Chloride 96 - 106 mmol/L 105 104 101  CO2 20 - 29 mmol/L _2 Calcium 8.7 - 10.3 mg/dL 9.3 9.6 9.4     Current antihypertensive regimen:  Olmesartan 20 mg 1 tablet  daily  Patient verbally confirms she is taking the above medications as directed. Yes  How often are you checking your Blood Pressure?          Patient stated she does not take her BP at home   Caffeine intake: 2 cups Salt intake:none OTC medications including pseudoephedrine or NSAIDs? No   What recent interventions/DTPs have been made by any provider to improve Blood Pressure control since last CPP Visit:  Patient decreased her sodium intake and increased her exercise.  Any recent hospitalizations or ED visits since last visit with CPP? No  What diet changes have been made to improve Blood Pressure Control?  Patient does not add salt to food.   What exercise is being done to improve your Blood Pressure Control?  Patient walks 30 minutes a day.  Adherence Review: Is the patient currently on ACE/ARB medication? Yes Does the patient have >5 day gap between last estimated fill dates? Yes CPP to review    Called patient 06/01/2021 left message Called patient 06/02/2021 left message Called patient 06/04/2021 left message  Care Gaps: Last annual wellness visit- 02/10/2022 Shingrix Vaccine  COVID-19 Vaccine booster  Star Rating Drugs:  Medication:  Last Fill: Day Supply   Olmesartan 20 mg      01/26/2021            90 DS Pitavastatin 4 mg       02/15/21               90 DS Ozempic 0.5 mg            Patient Assistance Program Pioglitazone-Metformin 15-500 mg  7/15, 10/08    90 DS   Cherlyn Labella Clinical Pharmacist Assistant [phone]

## 2021-06-02 DIAGNOSIS — M2142 Flat foot [pes planus] (acquired), left foot: Secondary | ICD-10-CM | POA: Diagnosis not present

## 2021-06-02 DIAGNOSIS — E118 Type 2 diabetes mellitus with unspecified complications: Secondary | ICD-10-CM | POA: Diagnosis not present

## 2021-06-02 DIAGNOSIS — B351 Tinea unguium: Secondary | ICD-10-CM | POA: Diagnosis not present

## 2021-06-02 DIAGNOSIS — S90122A Contusion of left lesser toe(s) without damage to nail, initial encounter: Secondary | ICD-10-CM | POA: Diagnosis not present

## 2021-06-02 DIAGNOSIS — L84 Corns and callosities: Secondary | ICD-10-CM | POA: Diagnosis not present

## 2021-06-04 DIAGNOSIS — E785 Hyperlipidemia, unspecified: Secondary | ICD-10-CM | POA: Diagnosis not present

## 2021-06-07 LAB — NMR, LIPOPROFILE
Cholesterol, Total: 181 mg/dL (ref 100–199)
HDL Particle Number: 37.8 umol/L (ref >=30.5)
HDL-C: 70 mg/dL (ref >39)
LDL Particle Number: 1036 nmol/L — ABNORMAL HIGH (ref <1000)
LDL Size: 21.8 nm (ref >20.5)
LDL-C (NIH Calc): 101 mg/dL — ABNORMAL HIGH (ref 0–99)
LP-IR Score: <25 (ref <=45)
Small LDL Particle Number: 203 nmol/L (ref <=527)
Triglycerides: 51 mg/dL (ref 0–149)

## 2021-06-08 ENCOUNTER — Ambulatory Visit (HOSPITAL_BASED_OUTPATIENT_CLINIC_OR_DEPARTMENT_OTHER): Payer: Medicare Other | Admitting: Internal Medicine

## 2021-06-08 ENCOUNTER — Encounter (HOSPITAL_BASED_OUTPATIENT_CLINIC_OR_DEPARTMENT_OTHER): Payer: Self-pay | Admitting: Internal Medicine

## 2021-06-08 ENCOUNTER — Other Ambulatory Visit: Payer: Self-pay

## 2021-06-08 VITALS — BP 123/60 | HR 87 | Ht <= 58 in | Wt 149.9 lb

## 2021-06-08 DIAGNOSIS — T466X5D Adverse effect of antihyperlipidemic and antiarteriosclerotic drugs, subsequent encounter: Secondary | ICD-10-CM

## 2021-06-08 DIAGNOSIS — T466X5A Adverse effect of antihyperlipidemic and antiarteriosclerotic drugs, initial encounter: Secondary | ICD-10-CM

## 2021-06-08 DIAGNOSIS — E119 Type 2 diabetes mellitus without complications: Secondary | ICD-10-CM | POA: Diagnosis not present

## 2021-06-08 DIAGNOSIS — E785 Hyperlipidemia, unspecified: Secondary | ICD-10-CM

## 2021-06-08 DIAGNOSIS — G720 Drug-induced myopathy: Secondary | ICD-10-CM | POA: Diagnosis not present

## 2021-06-08 MED ORDER — PITAVASTATIN CALCIUM 4 MG PO TABS: ORAL_TABLET | ORAL | 3 refills | Status: DC

## 2021-06-08 NOTE — Patient Instructions (Addendum)
Medication Instructions:  INCREASE livalo to every day  If you are taking BRAND NAME livalo and need co-pay assistance, you can apply for a grant from healthwellfoundation.org >> disease funds >> hypercholesterolemia-medicare access  *If you need a refill on your cardiac medications before your next appointment, please call your pharmacy*   Lab Work: FASTING lab work to check cholesterol in 6 months (NMR lipoprofile)   If you have labs (blood work) drawn today and your tests are completely normal, you will receive your results only by: [place] (if you have MyChart) OR A paper copy in the mail If you have any lab test that is abnormal or we need to change your treatment, we will call you to review the results.   Testing/Procedures: NONE   Follow-Up: At [facility], you and your health needs are our priority.  As part of our continuing mission to provide you with exceptional heart care, we have created designated Provider Care Teams.  These Care Teams include your primary Cardiologist (physician) and Advanced Practice Providers (APPs -  Physician Assistants and Nurse Practitioners) who all work together to provide you with the care you need, when you need it.  We recommend signing up for the patient portal called "MyChart".  Sign up information is provided on this After Visit Summary.  MyChart is used to connect with patients for Virtual Visits (Telemedicine).  Patients are able to view lab/test results, encounter notes, upcoming appointments, etc.  Non-urgent messages can be sent to your provider as well.   To learn more about what you can do with MyChart, go to [email or web address].    Your next appointment:   6 months with Dr. Debara Pickett -- lipid clinic

## 2021-06-08 NOTE — Progress Notes (Signed)
LIPID CLINIC CONSULT NOTE  Chief Complaint:  Follow-up dyslipidemia  Primary Care Physician: Glendale Chard, MD  Primary Cardiologist:  None  HPI:  Nicole Nunez is a 76 y.o. female who is being seen today for the evaluation of dyslipidemia at the request of Glendale Chard, MD. this is a pleasant 76 year old female kindly referred for evaluation and management of dyslipidemia by Dr. Baird Cancer.  She has a history of statin intolerance including rosuvastatin, atorvastatin, pravastatin and others.  In the past she is only been able to tolerate Livalo.  She has had been on 4 milligrams every day.  Previous cholesterol showed total 148, triglycerides 58, HDL 66 and LDL 70, which would be at target for her as a diabetic, however a subsequent lipid profile in January showed total cholesterol 185, triglycerides 68, HDL 64 and LDL 108.  She said she had not discontinue the medicine at that time however had significantly changed her diet and was not eating as healthy or exercising regularly.  She actually has run into issues with the cost of the medication.  She has been prescribed samples by her primary care provider however was noted to not be able to be sampled for ever.  She was therefore referred to the lipid clinic for options.  06/08/2021  Mrs. Nicole Nunez returns today for follow-up.  Overall she says she is doing well.  She has been able to get Livalo per insurance and is taking 4 mg 3 times a week.  Despite this her cholesterol is going up.  I suspect this was due to some weight gain and indulgence during the holidays.  LDL cholesterol is gone from 75 up to 101 with an LDL particle number from 705 up to 1036.  Blood pressures well controlled.  She remains asymptomatic without any chest pain or worsening shortness of breath.  PMHx:  Past Medical History:  Diagnosis Date   CKD stage 3 due to type 2 diabetes mellitus (HCC)    DM (diabetes mellitus) ([place])    High cholesterol    HTN  (hypertension)     Past Surgical History:  Procedure Laterality Date   CATARACT EXTRACTION, BILATERAL  01/2018   first was 12/2017   corn removal Bilateral 06/2017    FAMHx:  Family History  Problem Relation Age of Onset   Asthma Mother    Hypertension Mother    Alzheimer's disease Mother    Early death Father     SOCHx:   reports that she has never smoked. She has never used smokeless tobacco. She reports that she does not drink alcohol and does not use drugs.  ALLERGIES:  Allergies  Allergen Reactions   Statins Other (See Comments)    Rosuvastatin, atorvastatin, pravastatin, simvastatin    ROS: Pertinent items noted in HPI and remainder of comprehensive ROS otherwise negative.  HOME MEDS: Current Outpatient Medications on File Prior to Visit  Medication Sig Dispense Refill   aspirin EC 81 MG tablet Take 81 mg by mouth daily.     b complex vitamins tablet Take 1 tablet by mouth daily.     Biotin 2500 MCG CAPS Take 5,000 mg by mouth daily.     Calcium Carbonate (CALCIUM 600 PO) Take 1 tablet by mouth daily.      Cholecalciferol (VITAMIN D3) 125 MCG (5000 UT) CAPS Take 1 capsule by mouth daily.      dexlansoprazole (DEXILANT) 60 MG capsule Take 60 mg by mouth daily.     Lancet Devices (  ONE TOUCH DELICA LANCING DEV) MISC USE AS DIRECTED TO CHECK BLOOD SUGARS ONCE DAILY dx: e11.22 1 each 2   Lancets (ONETOUCH DELICA PLUS [id number]) MISC USE AS DIRECTED TO CHECK BLOOD SUGARS ONCE DAILY 100 each 11   magnesium gluconate (MAGONATE) 500 MG tablet Take 500 mg by mouth daily.     Multiple Vitamin (MULTIVITAMIN) tablet Take 1 tablet by mouth daily.     olmesartan (BENICAR) 20 MG tablet TAKE 1 TABLET BY MOUTH  DAILY 90 tablet 3   Omega-3 Fatty Acids (FISH OIL) 1000 MG CAPS Take 1 capsule by mouth daily.     ONETOUCH VERIO test strip USE AS DIRECTED TO CHECK  BLOOD SUGAR ONCE DAILY 100 strip 3   pioglitazone-metformin (ACTOPLUS MET) 15-500 MG tablet TAKE 1 TABLET BY MOUTH  TWICE  DAILY 180 tablet 3   Pitavastatin Calcium 4 MG TABS Take 1 tablet by mouth on Monday, Wednesday Friday 40 tablet 3   Probiotic Product (PROBIOTIC PO) Take by mouth.     Semaglutide,0.25 or 0.5MG/DOS, (OZEMPIC, 0.25 OR 0.5 MG/DOSE,) 2 MG/1.5ML SOPN INJECT SUBCUTANEOUSLY 0.5MG ONCE A WEEK ON THURSDAY 1.5 mL 1   zinc gluconate 50 MG tablet Take 50 mg by mouth daily.     pantoprazole (PROTONIX) 40 MG tablet TAKE 1 TABLET(40 MG) BY MOUTH DAILY (Patient not taking: Reported on 06/08/2021) 90 tablet 1   No current facility-administered medications on file prior to visit.    LABS/IMAGING: No results found for this or any previous visit (from the past 48 hour(s)). No results found.  LIPID PANEL:    Component Value Date/Time   CHOL 185 06/02/2020 0942   TRIG 68 06/02/2020 0942   HDL 64 06/02/2020 0942   CHOLHDL 2.9 06/02/2020 0942   LDLCALC 108 (H) 06/02/2020 0942    WEIGHTS: Wt Readings from Last 3 Encounters:  06/08/21 149 lb 14.4 oz (68 kg)  02/04/21 150 lb 6.4 oz (68.2 kg)  02/03/21 151 lb (68.5 kg)    VITALS: BP 123/60    Pulse 87    Ht _0  (1.448 m)    Wt 149 lb 14.4 oz (68 kg)    SpO2 99%    BMI 32.44 kg/m   EXAM: Deferred  EKG: Deferred  ASSESSMENT: Mixed dyslipidemia, goal LDL less than 70 Type 2 diabetes with moderate to high 10-year risk Vitamin D deficiency Statin intolerance-myalgias  PLAN: 1.   Ms. Nicole Nunez has had recent increase in LDL cholesterol and LDL particle numbers.  She reports compliance with Livalo 4 mg 3 times a week.  She never tried it on a regular basis and it may be well-tolerated.  I like to give her a new prescription for daily 4 mg dosing.  She will also need to watch her diet more closely and more physical activity over the next several months.  We will plan to repeat lipid in about 6 months and if she remains above target consider adding ezetimibe to her therapies.  Follow-up with me at that time.  Pixie Casino, MD, [facility], [place] Director of the Advanced Lipid Disorders &  Cardiovascular Risk Reduction Clinic Diplomate of the American Board of Clinical Lipidology Attending Cardiologist  Direct Dial: [phone]   Fax: [phone]  Website:  [email or web address] 06/08/2021, 11:20 AM

## 2021-06-15 ENCOUNTER — Other Ambulatory Visit: Payer: Self-pay

## 2021-06-15 MED ORDER — ONETOUCH DELICA PLUS LANCET33G MISC: 11 refills | Status: DC

## 2021-06-15 MED ORDER — ONETOUCH DELICA LANCING DEV MISC: 3 refills | Status: DC

## 2021-06-15 MED ORDER — ONETOUCH VERIO VI STRP: ORAL_STRIP | 3 refills | Status: DC

## 2021-06-23 ENCOUNTER — Encounter: Payer: Self-pay | Admitting: Internal Medicine

## 2021-06-23 ENCOUNTER — Other Ambulatory Visit: Payer: Self-pay

## 2021-06-23 ENCOUNTER — Ambulatory Visit (INDEPENDENT_AMBULATORY_CARE_PROVIDER_SITE_OTHER): Payer: Medicare Other | Admitting: Internal Medicine

## 2021-06-23 VITALS — BP 136/82 | HR 86 | Temp 98.3°F | Ht <= 58 in | Wt 148.4 lb

## 2021-06-23 DIAGNOSIS — N1831 Chronic kidney disease, stage 3a: Secondary | ICD-10-CM

## 2021-06-23 DIAGNOSIS — Z23 Encounter for immunization: Secondary | ICD-10-CM

## 2021-06-23 DIAGNOSIS — M79642 Pain in left hand: Secondary | ICD-10-CM

## 2021-06-23 DIAGNOSIS — K219 Gastro-esophageal reflux disease without esophagitis: Secondary | ICD-10-CM

## 2021-06-23 DIAGNOSIS — E1122 Type 2 diabetes mellitus with diabetic chronic kidney disease: Secondary | ICD-10-CM

## 2021-06-23 DIAGNOSIS — Z6832 Body mass index (BMI) 32.0-32.9, adult: Secondary | ICD-10-CM

## 2021-06-23 DIAGNOSIS — I129 Hypertensive chronic kidney disease with stage 1 through stage 4 chronic kidney disease, or unspecified chronic kidney disease: Secondary | ICD-10-CM | POA: Diagnosis not present

## 2021-06-23 DIAGNOSIS — M79641 Pain in right hand: Secondary | ICD-10-CM | POA: Diagnosis not present

## 2021-06-23 NOTE — Progress Notes (Signed)
Rich Brave [place],acting as a Education administrator for Maximino Greenland, MD.,have documented all relevant documentation on the behalf of Maximino Greenland, MD,as directed by  Maximino Greenland, MD while in the presence of Maximino Greenland, MD.  This visit occurred during the SARS-CoV-2 public health emergency.  Safety protocols were in place, including screening questions prior to the visit, additional usage of staff PPE, and extensive cleaning of exam room while observing appropriate contact time as indicated for disinfecting solutions.  Subjective:     Patient ID: Nicole Nunez , female    DOB: 1946/03/22 , 76 y.o.   MRN: [id number]   Chief Complaint  Patient presents with   Diabetes   Hypertension    HPI  She is here for a diabetes/htn check. She reports compliance with meds. She denies headaches, chest pain and shortness of breath.   Diabetes She presents for her follow-up diabetic visit. She has type 2 diabetes mellitus. Her disease course has been stable. There are no hypoglycemic associated symptoms. Pertinent negatives for diabetes include no blurred vision, no polydipsia, no polyphagia and no polyuria. There are no hypoglycemic complications. Current diabetic treatment includes oral agent (dual therapy). She is compliant with treatment most of the time. She is following a diabetic diet. She participates in exercise intermittently. An ACE inhibitor/angiotensin II receptor blocker is being taken. Eye exam is current.  Hypertension This is a chronic problem. The current episode started more than 1 year ago. The problem has been gradually improving since onset. The problem is controlled. Pertinent negatives include no blurred vision or palpitations. Hypertensive end-organ damage includes kidney disease.    Past Medical History:  Diagnosis Date   CKD stage 3 due to type 2 diabetes mellitus (HCC)    DM (diabetes mellitus) ([place])    High cholesterol    HTN (hypertension)      Family History   Problem Relation Age of Onset   Asthma Mother    Hypertension Mother    Alzheimer's disease Mother    Early death Father      Current Outpatient Medications:    aspirin EC 81 MG tablet, Take 81 mg by mouth daily., Disp: , Rfl:    b complex vitamins tablet, Take 1 tablet by mouth daily., Disp: , Rfl:    Biotin 2500 MCG CAPS, Take 5,000 mg by mouth daily., Disp: , Rfl:    Calcium Carbonate (CALCIUM 600 PO), Take 1 tablet by mouth daily. , Disp: , Rfl:    Cholecalciferol (VITAMIN D3) 125 MCG (5000 UT) CAPS, Take 1 capsule by mouth daily. , Disp: , Rfl:    dexlansoprazole (DEXILANT) 60 MG capsule, Take 60 mg by mouth daily., Disp: , Rfl:    Lancet Devices (ONE TOUCH DELICA LANCING DEV) MISC, USE AS DIRECTED TO CHECK BLOOD SUGARS ONCE DAILY dx: e11.22, Disp: 1 each, Rfl: 2   Lancet Devices (ONE TOUCH DELICA LANCING DEV) MISC, Use to check blood sugars daily. E11.01, Disp: 1 each, Rfl: 3   Lancets (ONETOUCH DELICA PLUS [id number]) MISC, USE AS DIRECTED TO CHECK BLOOD SUGARS ONCE DAILY, Disp: 100 each, Rfl: 11   magnesium gluconate (MAGONATE) 500 MG tablet, Take 500 mg by mouth daily., Disp: , Rfl:    Multiple Vitamin (MULTIVITAMIN) tablet, Take 1 tablet by mouth daily., Disp: , Rfl:    olmesartan (BENICAR) 20 MG tablet, TAKE 1 TABLET BY MOUTH  DAILY, Disp: 90 tablet, Rfl: 3   Omega-3 Fatty Acids (FISH OIL) 1000 MG CAPS, Take  1 capsule by mouth daily., Disp: , Rfl:    ONETOUCH VERIO test strip, Use to check blood sugars daily. E11.01, Disp: 100 strip, Rfl: 3   pioglitazone-metformin (ACTOPLUS MET) 15-500 MG tablet, TAKE 1 TABLET BY MOUTH  TWICE DAILY, Disp: 180 tablet, Rfl: 3   Pitavastatin Calcium 4 MG TABS, Take 1 tablet by mouth daily, Disp: 90 tablet, Rfl: 3   Probiotic Product (PROBIOTIC PO), Take by mouth., Disp: , Rfl:    Semaglutide,0.25 or 0.5MG/DOS, (OZEMPIC, 0.25 OR 0.5 MG/DOSE,) 2 MG/1.5ML SOPN, INJECT SUBCUTANEOUSLY 0.5MG ONCE A WEEK ON THURSDAY, Disp: 1.5 mL, Rfl: 1   zinc  gluconate 50 MG tablet, Take 50 mg by mouth daily., Disp: , Rfl:    pantoprazole (PROTONIX) 40 MG tablet, TAKE 1 TABLET(40 MG) BY MOUTH DAILY (Patient not taking: Reported on 06/08/2021), Disp: 90 tablet, Rfl: 1   Allergies  Allergen Reactions   Statins Other (See Comments)    Rosuvastatin, atorvastatin, pravastatin, simvastatin     Review of Systems  Constitutional: Negative.   Eyes: Negative.  Negative for blurred vision.  Respiratory: Negative.    Cardiovascular: Negative.  Negative for palpitations.  Gastrointestinal: Negative.   Endocrine: Negative for polydipsia, polyphagia and polyuria.  Musculoskeletal:  Positive for arthralgias.       She c/o b/l hand pain. There is stiffness upon awakening. Denies fall/trauma. Sx have worsened over past several weeks.   Skin: Negative.   Neurological: Negative.   Psychiatric/Behavioral: Negative.      Today's Vitals   06/23/21 0940  BP: 136/82  Pulse: 86  Temp: 98.3 F (36.8 C)  Weight: 148 lb 6.4 oz (67.3 kg)  Height: _0  (1.448 m)  PainSc: 0-No pain   Body mass index is 32.11 kg/m.  Wt Readings from Last 3 Encounters:  06/23/21 148 lb 6.4 oz (67.3 kg)  06/08/21 149 lb 14.4 oz (68 kg)  02/04/21 150 lb 6.4 oz (68.2 kg)     Objective:  Physical Exam Vitals and nursing note reviewed.  Constitutional:      Appearance: Normal appearance.  HENT:     Head: Normocephalic and atraumatic.     Nose:     Comments: MASKED    Mouth/Throat:     Comments: MASKED  Eyes:     Extraocular Movements: Extraocular movements intact.  Cardiovascular:     Rate and Rhythm: Normal rate and regular rhythm.     Heart sounds: Normal heart sounds.  Pulmonary:     Effort: Pulmonary effort is normal.     Breath sounds: Normal breath sounds.  Musculoskeletal:     Cervical back: Normal range of motion.     Comments: There is PIP joint swelling b/l. No overlying erythema Neg squeeze test b/l  Skin:    General: Skin is warm.  Neurological:      General: No focal deficit present.     Mental Status: She is alert.  Psychiatric:        Mood and Affect: Mood normal.        Behavior: Behavior normal.        Assessment And Plan:     1. Type 2 diabetes mellitus with stage 3a chronic kidney disease, without long-term current use of insulin (HCC) Comments: We discussed possibly increasing Ozempic. I will also d/c Actoplusmet and switch her to Xigduo 10/529m once daily.  - Rheumatoid factor - Sedimentation rate - Uric acid - PTH, intact and calcium - Phosphorus - Protein electrophoresis, serum - CMP14+EGFR -  Hemoglobin A1c  2. Parenchymal renal hypertension, stage 1 through stage 4 or unspecified chronic kidney disease Comments: Chronic, fair control. Goal BP < 130/80. Advised to follow low sodium diet.  3. Gastroesophageal reflux disease without esophagitis Comments: Chronic, reminded to stop eating 3 hours prior to bedtime. She will c/w Dexilant qod.   4. BMI 32.0-32.9,adult Comments: She is encouraged to aim for at least 150 minutes of exercise per week. Advised to aim for BMI <30 to decrease cardiac risk.  5. Bilateral hand pain Comments: I will check arthritis panel. However, pt advised that her sx are suggestive of OA.  - ANA, IFA (with reflex) - CYCLIC CITRUL PEPTIDE ANTIBODY, IGG/IGA  6. Need for vaccination Comments: She was given Shingrix IM x 1.  - Varicella-zoster vaccine IM (Shingrix)   Patient was given opportunity to ask questions. Patient verbalized understanding of the plan and was able to repeat key elements of the plan. All questions were answered to their satisfaction.   I, Maximino Greenland, MD, have reviewed all documentation for this visit. The documentation on 06/23/21 for the exam, diagnosis, procedures, and orders are all accurate and complete.   IF YOU HAVE BEEN REFERRED TO A SPECIALIST, IT MAY TAKE 1-2 WEEKS TO SCHEDULE/PROCESS THE REFERRAL. IF YOU HAVE NOT HEARD FROM US/SPECIALIST IN TWO WEEKS,  PLEASE GIVE [place] A CALL AT [phone] X [phone].   THE PATIENT IS ENCOURAGED TO PRACTICE SOCIAL DISTANCING DUE TO THE COVID-19 PANDEMIC.

## 2021-06-23 NOTE — Patient Instructions (Signed)

## 2021-06-25 ENCOUNTER — Other Ambulatory Visit: Payer: Self-pay | Admitting: Internal Medicine

## 2021-06-28 LAB — CMP14+EGFR
ALT: 21 IU/L (ref 0–32)
AST: 25 IU/L (ref 0–40)
Albumin/Globulin Ratio: 1.8 (ref 1.2–2.2)
Albumin: 4.4 g/dL (ref 3.7–4.7)
Alkaline Phosphatase: 101 IU/L (ref 44–121)
BUN/Creatinine Ratio: 15 (ref 12–28)
BUN: 18 mg/dL (ref 8–27)
Bilirubin Total: 0.5 mg/dL (ref 0.0–1.2)
CO2: 25 mmol/L (ref 20–29)
Calcium: 9.5 mg/dL (ref 8.7–10.3)
Chloride: 101 mmol/L (ref 96–106)
Creatinine, Ser: 1.19 mg/dL — ABNORMAL HIGH (ref 0.57–1.00)
Globulin, Total: 2.4 g/dL (ref 1.5–4.5)
Glucose: 99 mg/dL (ref 70–99)
Potassium: 4.2 mmol/L (ref 3.5–5.2)
Sodium: 139 mmol/L (ref 134–144)
Total Protein: 6.8 g/dL (ref 6.0–8.5)
eGFR: 48 mL/min/{1.73_m2} — ABNORMAL LOW (ref >59)

## 2021-06-28 LAB — HEMOGLOBIN A1C
Est. average glucose Bld gHb Est-mCnc: 137 mg/dL
Hgb A1c MFr Bld: 6.4 % — ABNORMAL HIGH (ref 4.8–5.6)

## 2021-06-28 LAB — PROTEIN ELECTROPHORESIS, SERUM
A/G Ratio: 1.1 (ref 0.7–1.7)
Albumin ELP: 3.6 g/dL (ref 2.9–4.4)
Alpha 1: 0.2 g/dL (ref 0.0–0.4)
Alpha 2: 0.7 g/dL (ref 0.4–1.0)
Beta: 1 g/dL (ref 0.7–1.3)
Gamma Globulin: 1.2 g/dL (ref 0.4–1.8)
Globulin, Total: 3.2 g/dL (ref 2.2–3.9)

## 2021-06-28 LAB — PHOSPHORUS: Phosphorus: 3.7 mg/dL (ref 3.0–4.3)

## 2021-06-28 LAB — SEDIMENTATION RATE: Sed Rate: 14 mm/hr (ref 0–40)

## 2021-06-28 LAB — PTH, INTACT AND CALCIUM: PTH: 24 pg/mL (ref 15–65)

## 2021-06-28 LAB — CYCLIC CITRUL PEPTIDE ANTIBODY, IGG/IGA: Cyclic Citrullin Peptide Ab: 3 units (ref 0–19)

## 2021-06-28 LAB — ANTINUCLEAR ANTIBODIES, IFA: ANA Titer 1: NEGATIVE

## 2021-06-28 LAB — URIC ACID: Uric Acid: 4.3 mg/dL (ref 3.1–7.9)

## 2021-06-28 LAB — RHEUMATOID FACTOR: Rheumatoid fact SerPl-aCnc: <10 IU/mL (ref <14.0)

## 2021-06-29 ENCOUNTER — Telehealth: Payer: Self-pay

## 2021-06-29 NOTE — Telephone Encounter (Signed)
The pt was given her most recent lab results and that the office is out of xigduo 50/500 samples.

## 2021-06-30 ENCOUNTER — Telehealth: Payer: Self-pay

## 2021-06-30 NOTE — Chronic Care Management (AMB) (Signed)
Chronic Care Management Pharmacy Assistant   Name: Nicole Nunez  MRN: [id number] DOB: 20-May-1946  Reason for Encounter: Disease State/ Diabetes  Recent office visits:  06-23-2021 Glendale Chard, MD. Shingrix given. A1C= 6.4. Creatinine= 1.19, eGFR= 48.  Recent consult visits:  06-08-2021 Pixie Casino, MD (Cardiology). CHANGE Pitavastatin 4 mg MWF To 4 mg daily. [facility] visits:  None in previous 6 months  Medications: Outpatient Encounter Medications as of 06/30/2021  Medication Sig Note   aspirin EC 81 MG tablet Take 81 mg by mouth daily.    b complex vitamins tablet Take 1 tablet by mouth daily.    Biotin 2500 MCG CAPS Take 5,000 mg by mouth daily.    Calcium Carbonate (CALCIUM 600 PO) Take 1 tablet by mouth daily.     Cholecalciferol (VITAMIN D3) 125 MCG (5000 UT) CAPS Take 1 capsule by mouth daily.     dexlansoprazole (DEXILANT) 60 MG capsule Take 60 mg by mouth daily.    Lancet Devices (ONE TOUCH DELICA LANCING DEV) MISC USE AS DIRECTED TO CHECK BLOOD SUGARS ONCE DAILY dx: e11.22    Lancet Devices (ONE TOUCH DELICA LANCING DEV) MISC Use to check blood sugars daily. E11.01    Lancets (ONETOUCH DELICA PLUS [id number]) MISC USE AS DIRECTED TO CHECK BLOOD SUGARS ONCE DAILY    magnesium gluconate (MAGONATE) 500 MG tablet Take 500 mg by mouth daily.    Multiple Vitamin (MULTIVITAMIN) tablet Take 1 tablet by mouth daily.    olmesartan (BENICAR) 20 MG tablet TAKE 1 TABLET BY MOUTH  DAILY    Omega-3 Fatty Acids (FISH OIL) 1000 MG CAPS Take 1 capsule by mouth daily.    ONETOUCH VERIO test strip Use to check blood sugars daily. E11.01    pantoprazole (PROTONIX) 40 MG tablet TAKE 1 TABLET(40 MG) BY MOUTH DAILY (Patient not taking: Reported on 06/08/2021)    pioglitazone-metformin (ACTOPLUS MET) 15-500 MG tablet TAKE 1 TABLET BY MOUTH  TWICE DAILY    Pitavastatin Calcium 4 MG TABS Take 1 tablet by mouth daily    Probiotic Product (PROBIOTIC PO) Take by mouth.     Semaglutide,0.25 or 0.5MG/DOS, (OZEMPIC, 0.25 OR 0.5 MG/DOSE,) 2 MG/1.5ML SOPN INJECT SUBCUTANEOUSLY 0.5MG ONCE A WEEK ON THURSDAY 03/02/2021: Once a day on Fridays.    zinc gluconate 50 MG tablet Take 50 mg by mouth daily.    No facility-administered encounter medications on file as of 06/30/2021.   Recent Relevant Labs: Lab Results  Component Value Date/Time   HGBA1C 6.4 (H) 06/23/2021 10:18 AM   HGBA1C 6.7 (H) 02/04/2021 10:24 AM   MICROALBUR 30 02/04/2021 10:00 AM   MICROALBUR 10 01/29/2020 12:51 PM    Kidney Function Lab Results  Component Value Date/Time   CREATININE 1.19 (H) 06/23/2021 10:18 AM   CREATININE 1.15 (H) 02/04/2021 10:24 AM   GFRNONAA 41 (L) 06/02/2020 09:42 AM   GFRAA 47 (L) 06/02/2020 09:42 AM    Current antihyperglycemic regimen:  Ozempic 0.5 mg weekly Pioglitazone-Metformin 15-500 mg twice daily  What recent interventions/DTPs have been made to improve glycemic control:  Educated on A1c and blood sugar goals; Complications of diabetes including kidney damage, retinal damage, and cardiovascular disease; -Counseled to check feet daily and get yearly eye exams -Recommended to continue current medication  Have there been any recent hospitalizations or ED visits since last visit with [facility]? No  Patient denies hypoglycemic symptoms  Patient denies hyperglycemic symptom  How often are you checking your blood sugar?  once daily  What are your blood sugars ranging?  Fasting: 113, 110 Before meals: None After meals: None Bedtime: None  During the week, how often does your blood glucose drop below 70? Never  Are you checking your feet daily/regularly? Daily  Adherence Review: Is the patient currently on a STATIN medication? Yes Is the patient currently on ACE/ARB medication? Yes Does the patient have >5 day gap between last estimated fill dates? No  NOTES: Patient saw Dr. Baird Cancer this month. Telephone appointment with Orlando Penner on 07-07-2021  rescheduled to April.  Care Gaps: Covid booster overdue Tdap overdue AWV 02-10-2022  Star Rating Drugs: Olmesartan 20 mg- Last filled 06-28-2021 [facility] Pioglitazone/Metformin 15-500 mg- Last filled 04-26-2021 90 DS [place] - Patient assistance   Jeannette How [place] Pharmacist Assistant [phone]

## 2021-07-01 ENCOUNTER — Encounter: Payer: Self-pay | Admitting: Internal Medicine

## 2021-07-07 ENCOUNTER — Telehealth: Payer: Medicare Other

## 2021-08-05 DIAGNOSIS — E113292 Type 2 diabetes mellitus with mild nonproliferative diabetic retinopathy without macular edema, left eye: Secondary | ICD-10-CM | POA: Diagnosis not present

## 2021-08-05 DIAGNOSIS — E113211 Type 2 diabetes mellitus with mild nonproliferative diabetic retinopathy with macular edema, right eye: Secondary | ICD-10-CM | POA: Diagnosis not present

## 2021-08-05 DIAGNOSIS — H43821 Vitreomacular adhesion, right eye: Secondary | ICD-10-CM | POA: Diagnosis not present

## 2021-08-05 DIAGNOSIS — Z961 Presence of intraocular lens: Secondary | ICD-10-CM | POA: Diagnosis not present

## 2021-08-05 LAB — HM DIABETES EYE EXAM

## 2021-08-10 ENCOUNTER — Other Ambulatory Visit: Payer: Self-pay

## 2021-08-10 DIAGNOSIS — Z79899 Other long term (current) drug therapy: Secondary | ICD-10-CM

## 2021-08-11 ENCOUNTER — Other Ambulatory Visit: Payer: Self-pay

## 2021-08-11 ENCOUNTER — Other Ambulatory Visit: Payer: Medicare Other

## 2021-08-11 DIAGNOSIS — Z79899 Other long term (current) drug therapy: Secondary | ICD-10-CM | POA: Diagnosis not present

## 2021-08-12 LAB — BMP8+EGFR
BUN/Creatinine Ratio: 19 (ref 12–28)
BUN: 21 mg/dL (ref 8–27)
CO2: 26 mmol/L (ref 20–29)
Calcium: 9.5 mg/dL (ref 8.7–10.3)
Chloride: 105 mmol/L (ref 96–106)
Creatinine, Ser: 1.13 mg/dL — ABNORMAL HIGH (ref 0.57–1.00)
Glucose: 118 mg/dL — ABNORMAL HIGH (ref 70–99)
Potassium: 4.9 mmol/L (ref 3.5–5.2)
Sodium: 143 mmol/L (ref 134–144)
eGFR: 51 mL/min/{1.73_m2} — ABNORMAL LOW (ref >59)

## 2021-08-17 DIAGNOSIS — B351 Tinea unguium: Secondary | ICD-10-CM | POA: Diagnosis not present

## 2021-08-17 DIAGNOSIS — M2142 Flat foot [pes planus] (acquired), left foot: Secondary | ICD-10-CM | POA: Diagnosis not present

## 2021-08-17 DIAGNOSIS — E118 Type 2 diabetes mellitus with unspecified complications: Secondary | ICD-10-CM | POA: Diagnosis not present

## 2021-08-17 DIAGNOSIS — L84 Corns and callosities: Secondary | ICD-10-CM | POA: Diagnosis not present

## 2021-08-17 DIAGNOSIS — S90122A Contusion of left lesser toe(s) without damage to nail, initial encounter: Secondary | ICD-10-CM | POA: Diagnosis not present

## 2021-08-20 ENCOUNTER — Other Ambulatory Visit: Payer: Self-pay

## 2021-08-20 MED ORDER — DAPAGLIFLOZIN PROPANEDIOL 10 MG PO TABS: 10.0000 mg | ORAL_TABLET | Freq: Every day | ORAL | 2 refills | Status: DC

## 2021-08-24 ENCOUNTER — Ambulatory Visit (INDEPENDENT_AMBULATORY_CARE_PROVIDER_SITE_OTHER): Payer: Medicare Other

## 2021-08-24 VITALS — BP 134/80 | HR 82 | Temp 97.9°F | Ht <= 58 in | Wt 145.4 lb

## 2021-08-24 DIAGNOSIS — Z23 Encounter for immunization: Secondary | ICD-10-CM

## 2021-08-24 NOTE — Progress Notes (Signed)
Pt presents today for second shingrix.  

## 2021-09-10 ENCOUNTER — Telehealth: Payer: Medicare Other

## 2021-09-21 ENCOUNTER — Ambulatory Visit (INDEPENDENT_AMBULATORY_CARE_PROVIDER_SITE_OTHER): Payer: Medicare Other | Admitting: Internal Medicine

## 2021-09-21 ENCOUNTER — Encounter: Payer: Self-pay | Admitting: Internal Medicine

## 2021-09-21 VITALS — BP 112/60 | HR 70 | Temp 97.8°F | Ht <= 58 in | Wt 147.4 lb

## 2021-09-21 DIAGNOSIS — Z23 Encounter for immunization: Secondary | ICD-10-CM

## 2021-09-21 DIAGNOSIS — E1122 Type 2 diabetes mellitus with diabetic chronic kidney disease: Secondary | ICD-10-CM | POA: Diagnosis not present

## 2021-09-21 DIAGNOSIS — Z6831 Body mass index (BMI) 31.0-31.9, adult: Secondary | ICD-10-CM

## 2021-09-21 DIAGNOSIS — E6609 Other obesity due to excess calories: Secondary | ICD-10-CM

## 2021-09-21 DIAGNOSIS — I129 Hypertensive chronic kidney disease with stage 1 through stage 4 chronic kidney disease, or unspecified chronic kidney disease: Secondary | ICD-10-CM | POA: Diagnosis not present

## 2021-09-21 DIAGNOSIS — N1831 Chronic kidney disease, stage 3a: Secondary | ICD-10-CM | POA: Diagnosis not present

## 2021-09-21 LAB — BMP8+EGFR
BUN/Creatinine Ratio: 18 (ref 12–28)
BUN: 19 mg/dL (ref 8–27)
CO2: 28 mmol/L (ref 20–29)
Calcium: 9.6 mg/dL (ref 8.7–10.3)
Chloride: 101 mmol/L (ref 96–106)
Creatinine, Ser: 1.08 mg/dL — ABNORMAL HIGH (ref 0.57–1.00)
Glucose: 117 mg/dL — ABNORMAL HIGH (ref 70–99)
Potassium: 4.8 mmol/L (ref 3.5–5.2)
Sodium: 141 mmol/L (ref 134–144)
eGFR: 54 mL/min/{1.73_m2} — ABNORMAL LOW (ref >59)

## 2021-09-21 LAB — CBC
Hematocrit: 35.9 % (ref 34.0–46.6)
Hemoglobin: 11.6 g/dL (ref 11.1–15.9)
MCH: 27.4 pg (ref 26.6–33.0)
MCHC: 32.3 g/dL (ref 31.5–35.7)
MCV: 85 fL (ref 79–97)
Platelets: 293 10*3/uL (ref 150–450)
RBC: 4.24 x10E6/uL (ref 3.77–5.28)
RDW: 13.2 % (ref 11.7–15.4)
WBC: 4.8 10*3/uL (ref 3.4–10.8)

## 2021-09-21 LAB — HEMOGLOBIN A1C
Est. average glucose Bld gHb Est-mCnc: 143 mg/dL
Hgb A1c MFr Bld: 6.6 % — ABNORMAL HIGH (ref 4.8–5.6)

## 2021-09-21 NOTE — Patient Instructions (Signed)

## 2021-09-21 NOTE — Progress Notes (Signed)
?Rich Brave [place]leton,acting as a Education administrator for Maximino Greenland, MD.,have documented all relevant documentation on the behalf of Maximino Greenland, MD,as directed by  Maximino Greenland, MD while in the presence of Maximino Greenland, MD.  ?This visit occurred during the SARS-CoV-2 public health emergency.  Safety protocols were in place, including screening questions prior to the visit, additional usage of staff PPE, and extensive cleaning of exam room while observing appropriate contact time as indicated for disinfecting solutions. ? ?Subjective:  ?  ? Patient ID: Nicole Nunez , female    DOB: 04/03/1946 , 76 y.o.   MRN: [id number] ? ? ?Chief Complaint  ?Patient presents with  ? Diabetes  ? Hypertension  ? ? ?HPI ? ?She is here for a diabetes/htn check. She reports compliance with meds. She denies headaches, chest pain and shortness of breath. Patient stated she is having swelling in her joints and wanted to know if there is anything she could take for that.  ? ?Diabetes ?She presents for her follow-up diabetic visit. She has type 2 diabetes mellitus. Her disease course has been stable. There are no hypoglycemic associated symptoms. Pertinent negatives for diabetes include no blurred vision, no polydipsia, no polyphagia and no polyuria. There are no hypoglycemic complications. Current diabetic treatment includes oral agent (dual therapy). She is compliant with treatment most of the time. She is following a diabetic diet. She participates in exercise intermittently. An ACE inhibitor/angiotensin II receptor blocker is being taken. Eye exam is current.  ?Hypertension ?This is a chronic problem. The current episode started more than 1 year ago. The problem has been gradually improving since onset. The problem is controlled. Pertinent negatives include no blurred vision or palpitations. Hypertensive end-organ damage includes kidney disease.   ? ?Past Medical History:  ?Diagnosis Date  ? CKD stage 3 due to type 2 diabetes  mellitus ([place])   ? DM (diabetes mellitus) ([place])   ? High cholesterol   ? HTN (hypertension)   ?  ? ?Family History  ?Problem Relation Age of Onset  ? Asthma Mother   ? Hypertension Mother   ? Alzheimer's disease Mother   ? Early death Father   ? ? ? ?Current Outpatient Medications:  ?  aspirin EC 81 MG tablet, Take 81 mg by mouth daily., Disp: , Rfl:  ?  b complex vitamins tablet, Take 1 tablet by mouth daily., Disp: , Rfl:  ?  Biotin 2500 MCG CAPS, Take 5,000 mg by mouth daily., Disp: , Rfl:  ?  Calcium Carbonate (CALCIUM 600 PO), Take 1 tablet by mouth daily. , Disp: , Rfl:  ?  Cholecalciferol (VITAMIN D3) 125 MCG (5000 UT) CAPS, Take 1 capsule by mouth daily. , Disp: , Rfl:  ?  dapagliflozin propanediol (FARXIGA) 10 MG TABS tablet, Take 1 tablet (10 mg total) by mouth daily., Disp: 90 tablet, Rfl: 2 ?  Lancet Devices (ONE TOUCH DELICA LANCING DEV) MISC, USE AS DIRECTED TO CHECK BLOOD SUGARS ONCE DAILY dx: e11.22, Disp: 1 each, Rfl: 2 ?  Lancet Devices (ONE TOUCH DELICA LANCING DEV) MISC, Use to check blood sugars daily. E11.01, Disp: 1 each, Rfl: 3 ?  Lancets (ONETOUCH DELICA PLUS [id number]) MISC, USE AS DIRECTED TO CHECK BLOOD SUGARS ONCE DAILY, Disp: 100 each, Rfl: 11 ?  magnesium gluconate (MAGONATE) 500 MG tablet, Take 500 mg by mouth daily., Disp: , Rfl:  ?  Multiple Vitamin (MULTIVITAMIN) tablet, Take 1 tablet by mouth daily., Disp: , Rfl:  ?  olmesartan (BENICAR) 20 MG tablet, TAKE 1 TABLET BY MOUTH  DAILY, Disp: 90 tablet, Rfl: 3 ?  Omega-3 Fatty Acids (FISH OIL) 1000 MG CAPS, Take 1 capsule by mouth daily., Disp: , Rfl:  ?  ONETOUCH VERIO test strip, Use to check blood sugars daily. E11.01, Disp: 100 strip, Rfl: 3 ?  pantoprazole (PROTONIX) 40 MG tablet, TAKE 1 TABLET(40 MG) BY MOUTH DAILY, Disp: 90 tablet, Rfl: 1 ?  Pitavastatin Calcium 4 MG TABS, Take 1 tablet by mouth daily, Disp: 90 tablet, Rfl: 3 ?  Probiotic Product (PROBIOTIC PO), Take by mouth., Disp: , Rfl:  ?  Semaglutide,0.25 or 0.5MG/DOS,  (OZEMPIC, 0.25 OR 0.5 MG/DOSE,) 2 MG/1.5ML SOPN, INJECT SUBCUTANEOUSLY 0.5MG ONCE A WEEK ON THURSDAY, Disp: 1.5 mL, Rfl: 1 ?  zinc gluconate 50 MG tablet, Take 50 mg by mouth daily., Disp: , Rfl:   ? ?Allergies  ?Allergen Reactions  ? Statins Other (See Comments)  ?  Rosuvastatin, atorvastatin, pravastatin, simvastatin  ?  ? ?Review of Systems  ?Constitutional: Negative.   ?Eyes:  Negative for blurred vision.  ?Respiratory: Negative.    ?Cardiovascular: Negative.  Negative for palpitations.  ?Endocrine: Negative for polydipsia, polyphagia and polyuria.  ?Neurological: Negative.   ?Psychiatric/Behavioral: Negative.     ? ?Today's Vitals  ? 09/21/21 0829  ?BP: 112/60  ?Pulse: 70  ?Temp: 97.8 ?F (36.6 ?C)  ?Weight: 147 lb 6.4 oz (66.9 kg)  ?Height: '4\' 9"'  (1.448 m)  ?PainSc: 0-No pain  ? ?Body mass index is 31.9 kg/m?.  ?Wt Readings from Last 3 Encounters:  ?09/21/21 147 lb 6.4 oz (66.9 kg)  ?08/24/21 145 lb 6.4 oz (66 kg)  ?06/23/21 148 lb 6.4 oz (67.3 kg)  ?  ? ?Objective:  ?Physical Exam ?Vitals and nursing note reviewed.  ?Constitutional:   ?   Appearance: Normal appearance.  ?HENT:  ?   Head: Normocephalic and atraumatic.  ?Eyes:  ?   Extraocular Movements: Extraocular movements intact.  ?Cardiovascular:  ?   Rate and Rhythm: Normal rate and regular rhythm.  ?   Heart sounds: Normal heart sounds.  ?Pulmonary:  ?   Effort: Pulmonary effort is normal.  ?   Breath sounds: Normal breath sounds.  ?Musculoskeletal:  ?   Cervical back: Normal range of motion.  ?Skin: ?   General: Skin is warm.  ?Neurological:  ?   General: No focal deficit present.  ?   Mental Status: She is alert.  ?Psychiatric:     ?   Mood and Affect: Mood normal.     ?   Behavior: Behavior normal.  ?  ? ?   ?Assessment And Plan:  ?   ?1. Type 2 diabetes mellitus with stage 3a chronic kidney disease, without long-term current use of insulin ([place]) ?Comments: Chronic, I will check labs as below. Importance of dietary/medication compliance was d/w  patient. She agrees to referral for the PREP program.  ?- Hemoglobin A1c ?- Amb Referral To Provider Referral Exercise Program (P.R.E.P) ? ?2. Parenchymal renal hypertension, stage 1 through stage 4 or unspecified chronic kidney disease ?Comments: Chronic, well controlled. No med changes. Encouraged to follow low sodium diet.  ?- BMP8+EGFR ?- CBC no Diff ?- Amb Referral To Provider Referral Exercise Program (P.R.E.P) ? ?3. Class 1 obesity due to excess calories with serious comorbidity and body mass index (BMI) of 31.0 to 31.9 in adult ?Comments: She is advised to aim for at least 150 minutes per week, while striving for BMI<30 to decrease  cardiac risk.  Again, she agrees to [employer] referral.  ?- Amb Referral To Provider Referral Exercise Program (P.R.E.P) ? ?4. Immunization due ?Comments: She was given Tdap to update her immunization history. This will be billed via [employer].  ?- Tdap vaccine greater than or equal to 7yo IM ?  ?Patient was given opportunity to ask questions. Patient verbalized understanding of the plan and was able to repeat key elements of the plan. All questions were answered to their satisfaction.  ? ?I, Maximino Greenland, MD, have reviewed all documentation for this visit. The documentation on 09/21/21 for the exam, diagnosis, procedures, and orders are all accurate and complete.  ? ?IF YOU HAVE BEEN REFERRED TO A SPECIALIST, IT MAY TAKE 1-2 WEEKS TO SCHEDULE/PROCESS THE REFERRAL. IF YOU HAVE NOT HEARD FROM US/SPECIALIST IN TWO WEEKS, PLEASE GIVE [place] A CALL AT [phone].  ? ?THE PATIENT IS ENCOURAGED TO PRACTICE SOCIAL DISTANCING DUE TO THE COVID-19 PANDEMIC.   ?

## 2021-10-20 ENCOUNTER — Telehealth: Payer: Self-pay

## 2021-10-20 NOTE — Chronic Care Management (AMB) (Cosign Needed)
[name] patient assistance program notification:  Patient is enrolled in auto-refill, 120- day supply of Ozempic 0.25/0.5 mg will be filled on 11/06/2021 and should arrive to the office in 10-14 business days. Patient enrollment will expire on 04/21/2022.  [name], [place] Pharmacist Assistant [phone]

## 2021-10-28 ENCOUNTER — Other Ambulatory Visit: Payer: Self-pay

## 2021-10-28 MED ORDER — PANTOPRAZOLE SODIUM 40 MG PO TBEC: 40.0000 mg | DELAYED_RELEASE_TABLET | Freq: Every day | ORAL | 1 refills | Status: DC

## 2021-11-22 ENCOUNTER — Telehealth: Payer: Self-pay

## 2021-11-22 NOTE — Chronic Care Management (AMB) (Signed)
[name] patient assistance program notification:  120- day supply of Ozempic 0.25/0.5 mg was filled on 11/09/2021 and should arrive to the office in 10-14 business days. Patient has 1  refill remaining and enrollment will expire on 04/21/2022.  The next refill for patient will be fulfilled on 01/26/2022.  [name], [place] Pharmacist Assistant [phone]

## 2021-11-29 DIAGNOSIS — E118 Type 2 diabetes mellitus with unspecified complications: Secondary | ICD-10-CM | POA: Diagnosis not present

## 2021-11-29 DIAGNOSIS — B351 Tinea unguium: Secondary | ICD-10-CM | POA: Diagnosis not present

## 2021-11-29 DIAGNOSIS — S90122A Contusion of left lesser toe(s) without damage to nail, initial encounter: Secondary | ICD-10-CM | POA: Diagnosis not present

## 2021-11-29 DIAGNOSIS — M2142 Flat foot [pes planus] (acquired), left foot: Secondary | ICD-10-CM | POA: Diagnosis not present

## 2021-11-29 DIAGNOSIS — L84 Corns and callosities: Secondary | ICD-10-CM | POA: Diagnosis not present

## 2021-12-31 ENCOUNTER — Telehealth: Payer: Medicare Other

## 2022-01-11 NOTE — Progress Notes (Signed)
This encounter was created in error - please disregard.

## 2022-01-19 ENCOUNTER — Telehealth: Payer: Self-pay

## 2022-01-19 NOTE — Chronic Care Management (AMB) (Signed)
    Chronic Care Management Pharmacy Assistant   Name: Nicole Nunez  MRN: [id number] DOB: [birth year]  Reason for Encounter: PAP  01-20-2022: Contacted [facility] to follow up on Ozempic and was told medication was shipped out but returned on 11-26-2021 due to office being closed. Patient was eligible for a free 30 day voucher: BIN [id number] PCN [place] ID [id number] GP [id number] Left patient a VM to confirm pharmacy to send ozempic refill to.  Completed and uploaded patient assistance applications for farxiga and livalo.    Medications: Outpatient Encounter Medications as of 01/19/2022  Medication Sig Note   aspirin EC 81 MG tablet Take 81 mg by mouth daily.    b complex vitamins tablet Take 1 tablet by mouth daily.    Biotin 2500 MCG CAPS Take 5,000 mg by mouth daily.    Calcium Carbonate (CALCIUM 600 PO) Take 1 tablet by mouth daily.     Cholecalciferol (VITAMIN D3) 125 MCG (5000 UT) CAPS Take 1 capsule by mouth daily.     dapagliflozin propanediol (FARXIGA) 10 MG TABS tablet Take 1 tablet (10 mg total) by mouth daily.    Lancet Devices (ONE TOUCH DELICA LANCING DEV) MISC USE AS DIRECTED TO CHECK BLOOD SUGARS ONCE DAILY dx: e11.22    Lancet Devices (ONE TOUCH DELICA LANCING DEV) MISC Use to check blood sugars daily. E11.01    Lancets (ONETOUCH DELICA PLUS [id number]) MISC USE AS DIRECTED TO CHECK BLOOD SUGARS ONCE DAILY    magnesium gluconate (MAGONATE) 500 MG tablet Take 500 mg by mouth daily.    Multiple Vitamin (MULTIVITAMIN) tablet Take 1 tablet by mouth daily.    olmesartan (BENICAR) 20 MG tablet TAKE 1 TABLET BY MOUTH  DAILY    Omega-3 Fatty Acids (FISH OIL) 1000 MG CAPS Take 1 capsule by mouth daily.    ONETOUCH VERIO test strip Use to check blood sugars daily. E11.01    pantoprazole (PROTONIX) 40 MG tablet Take 1 tablet (40 mg total) by mouth daily.    Pitavastatin Calcium 4 MG TABS Take 1 tablet by mouth daily    Probiotic Product (PROBIOTIC PO) Take by mouth.     Semaglutide,0.25 or 0.'5MG'$ /DOS, (OZEMPIC, 0.25 OR 0.5 MG/DOSE,) 2 MG/1.5ML SOPN INJECT SUBCUTANEOUSLY 0.'5MG'$  ONCE A WEEK ON THURSDAY 03/02/2021: Once a day on Fridays.    zinc gluconate 50 MG tablet Take 50 mg by mouth daily.    No facility-administered encounter medications on file as of 01/19/2022.    [place] Pharmacist Assistant [phone]

## 2022-01-26 ENCOUNTER — Other Ambulatory Visit: Payer: Self-pay

## 2022-01-26 MED ORDER — OZEMPIC (0.25 OR 0.5 MG/DOSE) 2 MG/3ML ~~LOC~~ SOPN: PEN_INJECTOR | SUBCUTANEOUS | 0 refills | Status: DC

## 2022-01-26 NOTE — Progress Notes (Signed)
01-26-2022: refill request sent in for [facility]empic voucher to [facility]. Contacted [facility] to provide voucher information.  [place] Pharmacist Assistant [phone]

## 2022-01-31 ENCOUNTER — Encounter: Payer: Self-pay | Admitting: Internal Medicine

## 2022-02-01 ENCOUNTER — Telehealth: Payer: Self-pay

## 2022-02-01 NOTE — Chronic Care Management (AMB) (Signed)
[name] patient assistance program notification:  120- day supply of Ozempic 0.25/0.5 mg will be filled on 02/13/2022 and should arrive to the office in 10-14 business days.   [name], [place] Pharmacist Assistant [phone]

## 2022-02-02 ENCOUNTER — Telehealth: Payer: Self-pay

## 2022-02-02 NOTE — Chronic Care Management (AMB) (Signed)
Patient assistance applications faxed for Farxiga 10 mg to [place] patient assistance program and for Livalo to [place] patient assistance program.  [name], [place] Pharmacist Assistant [phone]

## 2022-02-04 ENCOUNTER — Other Ambulatory Visit: Payer: Self-pay | Admitting: Internal Medicine

## 2022-02-04 DIAGNOSIS — Z1231 Encounter for screening mammogram for malignant neoplasm of breast: Secondary | ICD-10-CM

## 2022-02-07 DIAGNOSIS — M2142 Flat foot [pes planus] (acquired), left foot: Secondary | ICD-10-CM | POA: Diagnosis not present

## 2022-02-07 DIAGNOSIS — L84 Corns and callosities: Secondary | ICD-10-CM | POA: Diagnosis not present

## 2022-02-07 DIAGNOSIS — S90122A Contusion of left lesser toe(s) without damage to nail, initial encounter: Secondary | ICD-10-CM | POA: Diagnosis not present

## 2022-02-07 DIAGNOSIS — B351 Tinea unguium: Secondary | ICD-10-CM | POA: Diagnosis not present

## 2022-02-07 DIAGNOSIS — E118 Type 2 diabetes mellitus with unspecified complications: Secondary | ICD-10-CM | POA: Diagnosis not present

## 2022-02-10 ENCOUNTER — Telehealth: Payer: Self-pay

## 2022-02-10 ENCOUNTER — Ambulatory Visit (INDEPENDENT_AMBULATORY_CARE_PROVIDER_SITE_OTHER): Payer: Medicare Other

## 2022-02-10 ENCOUNTER — Ambulatory Visit: Payer: Medicare Other | Admitting: Internal Medicine

## 2022-02-10 VITALS — BP 118/70 | HR 74 | Temp 98.4°F | Ht 58.2 in | Wt 146.8 lb

## 2022-02-10 DIAGNOSIS — Z0000 Encounter for general adult medical examination without abnormal findings: Secondary | ICD-10-CM

## 2022-02-10 NOTE — Progress Notes (Signed)
Subjective:   Nicole Nunez is a 76 y.o. female who presents for Medicare Annual (Subsequent) preventive examination.  Review of Systems     Cardiac Risk Factors include: advanced age (>50mn, >>46women);diabetes mellitus;obesity (BMI >30kg/m2)     Objective:    Today's Vitals   02/10/22 0901  BP: 118/70  Pulse: 74  Temp: 98.4 F (36.9 C)  TempSrc: Oral  SpO2: 98%  Weight: 146 lb 12.8 oz (66.6 kg)  Height: 4' 10.2" (1.478 m)   Body mass index is 30.47 kg/m.     02/10/2022    9:10 AM 12/31/2020    8:45 AM 01/29/2020   11:42 AM 12/20/2018   10:52 AM  Advanced Directives  Does Patient Have a Medical Advance Directive? No No No No  Would patient like information on creating a medical advance directive? No - Patient declined  Yes (MAU/Ambulatory/Procedural Areas - Information given) No - Patient declined    Current Medications (verified) Outpatient Encounter Medications as of 02/10/2022  Medication Sig   ascorbic acid (VITAMIN C) 500 MG tablet Take 500 mg by mouth daily.   aspirin EC 81 MG tablet Take 81 mg by mouth daily.   b complex vitamins tablet Take 1 tablet by mouth daily.   Biotin 2500 MCG CAPS Take 5,000 mg by mouth daily.   Calcium Carbonate (CALCIUM 600 PO) Take 1 tablet by mouth daily.    Cholecalciferol (VITAMIN D3) 125 MCG (5000 UT) CAPS Take 1 capsule by mouth daily.    dapagliflozin propanediol (FARXIGA) 10 MG TABS tablet Take 1 tablet (10 mg total) by mouth daily.   Lancet Devices (ONE TOUCH DELICA LANCING DEV) MISC USE AS DIRECTED TO CHECK BLOOD SUGARS ONCE DAILY dx: e11.22   Lancet Devices (ONE TOUCH DELICA LANCING DEV) MISC Use to check blood sugars daily. E11.01   Lancets (ONETOUCH DELICA PLUS LQIONGE95M MISC USE AS DIRECTED TO CHECK BLOOD SUGARS ONCE DAILY   magnesium gluconate (MAGONATE) 500 MG tablet Take 500 mg by mouth daily.   Multiple Vitamin (MULTIVITAMIN) tablet Take 1 tablet by mouth daily.   olmesartan (BENICAR) 20 MG tablet TAKE 1 TABLET  BY MOUTH  DAILY   Omega-3 Fatty Acids (FISH OIL) 1000 MG CAPS Take 1 capsule by mouth daily.   ONETOUCH VERIO test strip Use to check blood sugars daily. E11.01   pantoprazole (PROTONIX) 40 MG tablet Take 1 tablet (40 mg total) by mouth daily.   Pitavastatin Calcium 4 MG TABS Take 1 tablet by mouth daily   Probiotic Product (PROBIOTIC PO) Take by mouth.   Semaglutide,0.25 or 0.'5MG'$ /DOS, (OZEMPIC, 0.25 OR 0.5 MG/DOSE,) 2 MG/3ML SOPN Inject 0.'5mg'$  SQ weekly.   zinc gluconate 50 MG tablet Take 50 mg by mouth daily.   No facility-administered encounter medications on file as of 02/10/2022.    Allergies (verified) Statins   History: Past Medical History:  Diagnosis Date   CKD stage 3 due to type 2 diabetes mellitus (HCC)    DM (diabetes mellitus) ([facility]    High cholesterol    HTN (hypertension)    Past Surgical History:  Procedure Laterality Date   CATARACT EXTRACTION, BILATERAL  01/2018   first was 12/2017   corn removal Bilateral 06/2017   Family History  Problem Relation Age of Onset   Asthma Mother    Hypertension Mother    Alzheimer's disease Mother    Early death Father    Social History   Socioeconomic History   Marital status: Widowed  Spouse name: Not on file   Number of children: Not on file   Years of education: Not on file   Highest education level: Not on file  Occupational History   Not on file  Tobacco Use   Smoking status: Never   Smokeless tobacco: Never  Vaping Use   Vaping Use: Never used  Substance and Sexual Activity   Alcohol use: Never   Drug use: Never   Sexual activity: Yes  Other Topics Concern   Not on file  Social History Narrative   Not on file   Social Determinants of Health   Financial Resource Strain: Low Risk  (02/10/2022)   Overall Financial Resource Strain (CARDIA)    Difficulty of Paying Living Expenses: Not hard at all  Food Insecurity: No Food Insecurity (02/10/2022)   Hunger Vital Sign    Worried About Running Out of Food  in the Last Year: Never true    [place] in the Last Year: Never true  Transportation Needs: No Transportation Needs (02/10/2022)   PRAPARE - Hydrologist (Medical): No    Lack of Transportation (Non-Medical): No  Physical Activity: Inactive (02/10/2022)   Exercise Vital Sign    Days of Exercise per Week: 0 days    Minutes of Exercise per Session: 0 min  Stress: No Stress Concern Present (02/10/2022)   [place]    Feeling of Stress : Not at all  Social Connections: Unknown (05/05/2020)   Social Connection and Isolation Panel [NHANES]    Frequency of Communication with Friends and Family: More than three times a week    Frequency of Social Gatherings with Friends and Family: More than three times a week    Attends Religious Services: More than 4 times per year    Active Member of [employer] or Organizations: Yes    Attends Music therapist: More than 4 times per year    Marital Status: Not on file    Tobacco Counseling Counseling given: Not Answered   Clinical Intake:  Pre-visit preparation completed: Yes  Pain : No/denies pain     Nutritional Status: BMI > 30  Obese Nutritional Risks: None Diabetes: Yes  How often do you need to have someone help you when you read instructions, pamphlets, or other written materials from your doctor or pharmacy?: 1 - Never  Diabetic? Yes Nutrition Risk Assessment:  Has the patient had any N/V/D within the last 2 months?  No  Does the patient have any non-healing wounds?  No  Has the patient had any unintentional weight loss or weight gain?  No   Diabetes:  Is the patient diabetic?  Yes  If diabetic, was a CBG obtained today?  No  Did the patient bring in their glucometer from home?  No  How often do you monitor your CBG's? daily.   Financial Strains and Diabetes Management:  Are you having any financial strains with the  device, your supplies or your medication? No .  Does the patient want to be seen by Chronic Care Management for management of their diabetes?  No  Would the patient like to be referred to a Nutritionist or for Diabetic Management?  No   Diabetic Exams:  Diabetic Eye Exam: Completed 08/05/2021 Diabetic Foot Exam: Overdue, Pt has been advised about the importance in completing this exam. Pt is scheduled for diabetic foot exam on next appointment.   Interpreter Needed?:  No  Information entered by :: NAllen LPN   Activities of Daily Living    02/10/2022    9:11 AM  In your present state of health, do you have any difficulty performing the following activities:  Hearing? 0  Vision? 0  Difficulty concentrating or making decisions? 0  Walking or climbing stairs? 0  Dressing or bathing? 0  Doing errands, shopping? 0  Preparing Food and eating ? N  Using the Toilet? N  In the past six months, have you accidently leaked urine? N  Do you have problems with loss of bowel control? N  Managing your Medications? N  Managing your Finances? N  Housekeeping or managing your Housekeeping? N    Patient Care Team: Glendale Chard, MD as PCP - General (Internal Medicine) Mayford Knife, [facility] (Pharmacist)  Indicate any recent Medical Services you may have received from other than [facility] providers in the past year (date may be approximate).     Assessment:   This is a routine wellness examination for Nicole Nunez.  Hearing/Vision screen Vision Screening - Comments:: Regular eye exams, Dr. Geri Seminole  Dietary issues and exercise activities discussed: Current Exercise Habits: The patient does not participate in regular exercise at present   Goals Addressed             This Visit's Progress    Patient Stated       02/10/2022, stay healthy       Depression Screen    02/10/2022    9:11 AM 12/31/2020    8:46 AM 01/29/2020   11:43 AM 12/20/2018   10:54 AM 08/27/2018    9:55 AM 05/24/2018   10:58  AM 12/21/2017   11:24 AM  PHQ 2/9 Scores  PHQ - 2 Score 0 0 0 0 0 0 0  PHQ- 9 Score   0 0       Fall Risk    02/10/2022    9:11 AM 12/31/2020    8:46 AM 01/29/2020   11:42 AM 12/20/2018   11:12 AM 12/20/2018   10:54 AM  Fall Risk   Falls in the past year? 0 0 0 0 Exclusion - non ambulatory  Number falls in past yr: 0      Injury with Fall? 0      Risk for fall due to : Medication side effect Medication side effect Medication side effect  Medication side effect  Follow up Falls prevention discussed;Falls evaluation completed;Education provided Falls evaluation completed;Education provided;Falls prevention discussed Falls evaluation completed;Education provided;Falls prevention discussed  Education provided;Falls evaluation completed;Falls prevention discussed    FALL RISK PREVENTION PERTAINING TO THE HOME:  Any stairs in or around the home? No  If so, are there any without handrails? N/a Home free of loose throw rugs in walkways, pet beds, electrical cords, etc? Yes  Adequate lighting in your home to reduce risk of falls? Yes   ASSISTIVE DEVICES UTILIZED TO PREVENT FALLS:  Life alert? No  Use of a cane, walker or w/c? No  Grab bars in the bathroom? No  Shower chair or bench in shower? No  Elevated toilet seat or a handicapped toilet? No   TIMED UP AND GO:  Was the test performed? Yes .  Length of time to ambulate 10 feet: 5 sec.   Gait steady and fast without use of assistive device  Cognitive Function:        02/10/2022    9:12 AM 12/31/2020    8:47 AM 01/29/2020   11:45  AM 12/20/2018   10:57 AM  6CIT Screen  What Year? 0 points 0 points 0 points 0 points  What month? 0 points 0 points 0 points 0 points  What time? 0 points 0 points 0 points 0 points  Count back from 20 0 points 0 points 0 points 0 points  Months in reverse 0 points 0 points 0 points 0 points  Repeat phrase 0 points 0 points 0 points 0 points  Total Score 0 points 0 points 0 points 0 points     Immunizations Immunization History  Administered Date(s) Administered   DTaP 06/17/2011   Fluad Quad(high Dose 65+) 03/19/2019, 01/29/2020, 02/04/2021   Influenza-Unspecified 02/06/2018   PFIZER(Purple Top)SARS-COV-2 Vaccination 07/08/2019, 07/30/2019, 05/10/2020   Pneumococcal Conjugate-13 05/07/2014   Pneumococcal Polysaccharide-23 02/28/2013   Pneumococcal-Unspecified 02/25/2013, 05/07/2015   Tdap 09/21/2021   Zoster Recombinat (Shingrix) 06/23/2021, 08/24/2021   Zoster, Live 04/30/2013    TDAP status: Up to date  Flu Vaccine status: Declined, Education has been provided regarding the importance of this vaccine but patient still declined. Advised may receive this vaccine at local pharmacy or Health Dept. Aware to provide a copy of the vaccination record if obtained from local pharmacy or Health Dept. Verbalized acceptance and understanding.  Pneumococcal vaccine status: Up to date  Covid-19 vaccine status: Completed vaccines  Qualifies for Shingles Vaccine? Yes   Zostavax completed Yes   Shingrix Completed?: Yes  Screening Tests Health Maintenance  Topic Date Due   COVID-19 Vaccine (4 - Pfizer risk series) 07/05/2020   INFLUENZA VACCINE  12/21/2021   Diabetic kidney evaluation - Urine ACR  02/04/2022   FOOT EXAM  02/04/2022   HEMOGLOBIN A1C  03/24/2022   OPHTHALMOLOGY EXAM  08/06/2022   Diabetic kidney evaluation - GFR measurement  09/22/2022   MAMMOGRAM  03/10/2023   COLONOSCOPY (Pts 45-52yr Insurance coverage will need to be confirmed)  12/17/2025   TETANUS/TDAP  09/22/2031   Pneumonia Vaccine 76 Years old  Completed   DEXA SCAN  Completed   Hepatitis C Screening  Completed   Zoster Vaccines- Shingrix  Completed   HPV VACCINES  Aged Out    Health Maintenance  Health Maintenance Due  Topic Date Due   COVID-19 Vaccine (4 - Pfizer risk series) 07/05/2020   INFLUENZA VACCINE  12/21/2021   Diabetic kidney evaluation - Urine ACR  02/04/2022   FOOT EXAM   02/04/2022    Colorectal cancer screening: Type of screening: Colonoscopy. Completed 12/18/2015. Repeat every 10 years  Mammogram status scheduled for 03/11/2022  Bone Density status: Completed 04/18/2018.   Lung Cancer Screening: (Low Dose CT Chest recommended if Age 656-80years, 30 pack-year currently smoking OR have quit w/in 15years.) does not qualify.   Lung Cancer Screening Referral: no  Additional Screening:  Hepatitis C Screening: does qualify; Completed 06/19/2012  Vision Screening: Recommended annual ophthalmology exams for early detection of glaucoma and other disorders of the eye. Is the patient up to date with their annual eye exam?  Yes  Who is the provider or what is the name of the office in which the patient attends annual eye exams? Dr. GGeri SeminoleIf pt is not established with a provider, would they like to be referred to a provider to establish care? No .   Dental Screening: Recommended annual dental exams for proper oral hygiene  Community Resource Referral / Chronic Care Management: CRR required this visit?  No   CCM required this visit?  No  Plan:     I have personally reviewed and noted the following in the patient's chart:   Medical and social history Use of alcohol, tobacco or illicit drugs  Current medications and supplements including opioid prescriptions. Patient is not currently taking opioid prescriptions. Functional ability and status Nutritional status Physical activity Advanced directives List of other physicians Hospitalizations, surgeries, and ER visits in previous 12 months Vitals Screenings to include cognitive, depression, and falls Referrals and appointments  In addition, I have reviewed and discussed with patient certain preventive protocols, quality metrics, and best practice recommendations. A written personalized care plan for preventive services as well as general preventive health recommendations were provided to patient.      Kellie Simmering, LPN   08/29/8117   Nurse Notes: none

## 2022-02-10 NOTE — Patient Instructions (Signed)
Nicole Nunez , Thank you for taking time to come for your Medicare Wellness Visit. I appreciate your ongoing commitment to your health goals. Please review the following plan we discussed and let me know if I can assist you in the future.   These are the goals we discussed:  Goals      Exercise 150 min/wk Moderate Activity     12/20/2018, wants to get back on exercise regimen     Manage My Medicine     Timeframe:  Long-Range Goal Priority:  High Start Date:                             Expected End Date:                       Follow Up Date 07/07/2021   - call for medicine refill 2 or 3 days before it runs out - call if I am sick and can't take my medicine - learn to read medicine labels - use a pillbox to sort medicine    Why is this important?   These steps will help you keep on track with your medicines.        Patient Stated     01/29/2020, start exercising again     Patient Stated     12/31/2020, eat healthier and exercise     Patient Stated     02/10/2022, stay healthy     Pharmacy Care Plan     CARE PLAN ENTRY  Current Barriers:  Chronic Disease Management support, education, and care coordination needs related to Hypertension, Hyperlipidemia, Diabetes, and Osteopenia   Hypertension Pharmacist Clinical Goal(s): Over the next 90 days, patient will work with PharmD and providers to maintain BP goal <130/80 Current regimen:  Olmesartan '20mg'$  daily Interventions: Increase exercise regimen to 30 minutes 5 times weekly Maintain heart healthy, balanced diet Check blood pressure if symptomatic Patient self care activities - Over the next 90 days, patient will: Check BP if symptomatic, document, and provide at future appointments Ensure daily salt intake < 2300 mg/day Try to exercise for 30 minutes daily 5 times per week (150 minutes per week total)  Hyperlipidemia Pharmacist Clinical Goal(s): Over the next 90 days, patient will work with PharmD and providers to  maintain LDL goal < 70 Current regimen:  Livalo '2mg'$  three times weekly (Mon/Wed/Fri) Interventions: Provided pt with samples of Livalo '2mg'$  on 05/05/2020 - 3 month supply Patient self care activities - Over the next 90 days, patient will: Continue to limit fried foods Take medications daily as directed Try to exercise for 30 minutes daily 5 times per week (150 minutes per week total)  Diabetes Pharmacist Clinical Goal(s): Over the next 90 days, patient will work with PharmD and providers to maintain A1c goal <7% Current regimen:  Ozempic 0.'5mg'$  weekly (Fridays) Pioglitazone/Metformin 15/'500mg'$  daily Interventions: Increase exercise to 30 minutes 5 times weekly Encouraged continuation of diabetic-friendly, balanced diet Completion of PAP paperwork for patients Ozempic for yearly renewal 2022 Patient self care activities - Over the next 90 days, patient will: Check blood sugar once daily, document, and provide at future appointments Contact provider with any episodes of hypoglycemia Try to exercise for 30 minutes daily 5 times per week (150 minutes per week total)  Osteopenia  Pharmacist Clinical Goal(s): Over the next 90 days, patient will work with PharmD and providers to build and maintain bone density Current regimen:  Cholecalciferol 5000 units daily Calcium carbonate '600mg'$  daily Interventions: Recommend patient increase calcium supplement to twice daily for a total of '1200mg'$  daily Discussed recommended calcium intake for osteopenia  Will provide patient with resource regarding calcium content of common foods Patient self care activities - Over the next 90 days, patient will: Begin taking calcium '600mg'$  twice daily (if she determines she does not get additional '600mg'$  of calcium from diet daily)   Medication management Pharmacist Clinical Goal(s): Over the next 90 days, patient will work with PharmD and providers to maintain optimal medication adherence Current pharmacy:  [facility] Interventions Comprehensive medication review performed. Continue current medication management strategy Patient self care activities - Over the next 90 days, patient will: Focus on medication adherence by utilizing weekly pill box to organize medications Take medications as prescribed Report any questions or concerns to PharmD and/or provider(s)  Please see past updates related to this goal by clicking on the "Past Updates" button in the selected goal          This is a list of the screening recommended for you and due dates:  Health Maintenance  Topic Date Due   COVID-19 Vaccine (4 - Pfizer risk series) 07/05/2020   Flu Shot  12/21/2021   Yearly kidney health urinalysis for diabetes  02/04/2022   Complete foot exam   02/04/2022   Hemoglobin A1C  03/24/2022   Eye exam for diabetics  08/06/2022   Yearly kidney function blood test for diabetes  09/22/2022   Mammogram  03/10/2023   Colon Cancer Screening  12/17/2025   Tetanus Vaccine  09/22/2031   Pneumonia Vaccine  Completed   DEXA scan (bone density measurement)  Completed   Hepatitis C Screening: USPSTF Recommendation to screen - Ages 64-79 yo.  Completed   Zoster (Shingles) Vaccine  Completed   HPV Vaccine  Aged Out    Advanced directives: Advance directive discussed with you today. Even though you declined this today please call our office should you change your mind and we can give you the proper paperwork for you to fill out.   Conditions/risks identified: none  Next appointment: Follow up in one year for your annual wellness visit    Preventive Care 65 Years and Older, Female Preventive care refers to lifestyle choices and visits with your health care provider that can promote health and wellness. What does preventive care include? A yearly physical exam. This is also called an annual well check. Dental exams once or twice a year. Routine eye exams. Ask your health care  provider how often you should have your eyes checked. Personal lifestyle choices, including: Daily care of your teeth and gums. Regular physical activity. Eating a healthy diet. Avoiding tobacco and drug use. Limiting alcohol use. Practicing safe sex. Taking low-dose aspirin every day. Taking vitamin and mineral supplements as recommended by your health care provider. What happens during an annual well check? The services and screenings done by your health care provider during your annual well check will depend on your age, overall health, lifestyle risk factors, and family history of disease. Counseling  Your health care provider may ask you questions about your: Alcohol use. Tobacco use. Drug use. Emotional well-being. Home and relationship well-being. Sexual activity. Eating habits. History of falls. Memory and ability to understand (cognition). Work and work Statistician. Reproductive health. Screening  You may have the following tests or measurements: Height, weight, and BMI. Blood pressure. Lipid and cholesterol levels. These may be checked  every 5 years, or more frequently if you are over 51 years old. Skin check. Lung cancer screening. You may have this screening every year starting at age 70 if you have a 30-pack-year history of smoking and currently smoke or have quit within the past 15 years. Fecal occult blood test (FOBT) of the stool. You may have this test every year starting at age 59. Flexible sigmoidoscopy or colonoscopy. You may have a sigmoidoscopy every 5 years or a colonoscopy every 10 years starting at age 66. Hepatitis C blood test. Hepatitis B blood test. Sexually transmitted disease (STD) testing. Diabetes screening. This is done by checking your blood sugar (glucose) after you have not eaten for a while (fasting). You may have this done every 1-3 years. Bone density scan. This is done to screen for osteoporosis. You may have this done starting at age  30. Mammogram. This may be done every 1-2 years. Talk to your health care provider about how often you should have regular mammograms. Talk with your health care provider about your test results, treatment options, and if necessary, the need for more tests. Vaccines  Your health care provider may recommend certain vaccines, such as: Influenza vaccine. This is recommended every year. Tetanus, diphtheria, and acellular pertussis (Tdap, Td) vaccine. You may need a Td booster every 10 years. Zoster vaccine. You may need this after age 81. Pneumococcal 13-valent conjugate (PCV13) vaccine. One dose is recommended after age 94. Pneumococcal polysaccharide (PPSV23) vaccine. One dose is recommended after age 82. Talk to your health care provider about which screenings and vaccines you need and how often you need them. This information is not intended to replace advice given to you by your health care provider. Make sure you discuss any questions you have with your health care provider. Document Released: 06/05/2015 Document Revised: 01/27/2016 Document Reviewed: 03/10/2015 Elsevier Interactive Patient Education  2017 [place] Prevention in the Home Falls can cause injuries. They can happen to people of all ages. There are many things you can do to make your home safe and to help prevent falls. What can I do on the outside of my home? Regularly fix the edges of walkways and driveways and fix any cracks. Remove anything that might make you trip as you walk through a door, such as a raised step or threshold. Trim any bushes or trees on the path to your home. Use bright outdoor lighting. Clear any walking paths of anything that might make someone trip, such as rocks or tools. Regularly check to see if handrails are loose or broken. Make sure that both sides of any steps have handrails. Any raised decks and porches should have guardrails on the edges. Have any leaves, snow, or ice cleared  regularly. Use sand or salt on walking paths during winter. Clean up any spills in your garage right away. This includes oil or grease spills. What can I do in the bathroom? Use night lights. Install grab bars by the toilet and in the tub and shower. Do not use towel bars as grab bars. Use non-skid mats or decals in the tub or shower. If you need to sit down in the shower, use a plastic, non-slip stool. Keep the floor dry. Clean up any water that spills on the floor as soon as it happens. Remove soap buildup in the tub or shower regularly. Attach bath mats securely with double-sided non-slip rug tape. Do not have throw rugs and other things on the floor that can  make you trip. What can I do in the bedroom? Use night lights. Make sure that you have a light by your bed that is easy to reach. Do not use any sheets or blankets that are too big for your bed. They should not hang down onto the floor. Have a firm chair that has side arms. You can use this for support while you get dressed. Do not have throw rugs and other things on the floor that can make you trip. What can I do in the kitchen? Clean up any spills right away. Avoid walking on wet floors. Keep items that you use a lot in easy-to-reach places. If you need to reach something above you, use a strong step stool that has a grab bar. Keep electrical cords out of the way. Do not use floor polish or wax that makes floors slippery. If you must use wax, use non-skid floor wax. Do not have throw rugs and other things on the floor that can make you trip. What can I do with my stairs? Do not leave any items on the stairs. Make sure that there are handrails on both sides of the stairs and use them. Fix handrails that are broken or loose. Make sure that handrails are as long as the stairways. Check any carpeting to make sure that it is firmly attached to the stairs. Fix any carpet that is loose or worn. Avoid having throw rugs at the top or  bottom of the stairs. If you do have throw rugs, attach them to the floor with carpet tape. Make sure that you have a light switch at the top of the stairs and the bottom of the stairs. If you do not have them, ask someone to add them for you. What else can I do to help prevent falls? Wear shoes that: Do not have high heels. Have rubber bottoms. Are comfortable and fit you well. Are closed at the toe. Do not wear sandals. If you use a stepladder: Make sure that it is fully opened. Do not climb a closed stepladder. Make sure that both sides of the stepladder are locked into place. Ask someone to hold it for you, if possible. Clearly mark and make sure that you can see: Any grab bars or handrails. First and last steps. Where the edge of each step is. Use tools that help you move around (mobility aids) if they are needed. These include: Canes. Walkers. Scooters. Crutches. Turn on the lights when you go into a dark area. Replace any light bulbs as soon as they burn out. Set up your furniture so you have a clear path. Avoid moving your furniture around. If any of your floors are uneven, fix them. If there are any pets around you, be aware of where they are. Review your medicines with your doctor. Some medicines can make you feel dizzy. This can increase your chance of falling. Ask your doctor what other things that you can do to help prevent falls. This information is not intended to replace advice given to you by your health care provider. Make sure you discuss any questions you have with your health care provider. Document Released: 03/05/2009 Document Revised: 10/15/2015 Document Reviewed: 06/13/2014 Elsevier Interactive Patient Education  2017 [employer].

## 2022-02-10 NOTE — Chronic Care Management (AMB) (Signed)
02-10-2022: Informed patient that Ozempic is ready for pick up. Patient had AWV this morning and will go back by office to pick up Ozempic.  [place] Pharmacist Assistant [phone]

## 2022-02-17 ENCOUNTER — Encounter: Payer: Self-pay | Admitting: Internal Medicine

## 2022-02-17 ENCOUNTER — Ambulatory Visit (INDEPENDENT_AMBULATORY_CARE_PROVIDER_SITE_OTHER): Payer: Medicare Other | Admitting: Internal Medicine

## 2022-02-17 VITALS — BP 122/76 | Temp 98.2°F | Ht 58.2 in | Wt 145.0 lb

## 2022-02-17 DIAGNOSIS — E7800 Pure hypercholesterolemia, unspecified: Secondary | ICD-10-CM

## 2022-02-17 DIAGNOSIS — E1122 Type 2 diabetes mellitus with diabetic chronic kidney disease: Secondary | ICD-10-CM

## 2022-02-17 DIAGNOSIS — Z0000 Encounter for general adult medical examination without abnormal findings: Secondary | ICD-10-CM | POA: Diagnosis not present

## 2022-02-17 DIAGNOSIS — N1831 Chronic kidney disease, stage 3a: Secondary | ICD-10-CM | POA: Diagnosis not present

## 2022-02-17 DIAGNOSIS — E2839 Other primary ovarian failure: Secondary | ICD-10-CM

## 2022-02-17 DIAGNOSIS — R0982 Postnasal drip: Secondary | ICD-10-CM | POA: Diagnosis not present

## 2022-02-17 DIAGNOSIS — Z23 Encounter for immunization: Secondary | ICD-10-CM

## 2022-02-17 DIAGNOSIS — J309 Allergic rhinitis, unspecified: Secondary | ICD-10-CM | POA: Diagnosis not present

## 2022-02-17 DIAGNOSIS — Z6830 Body mass index (BMI) 30.0-30.9, adult: Secondary | ICD-10-CM

## 2022-02-17 DIAGNOSIS — I129 Hypertensive chronic kidney disease with stage 1 through stage 4 chronic kidney disease, or unspecified chronic kidney disease: Secondary | ICD-10-CM | POA: Diagnosis not present

## 2022-02-17 DIAGNOSIS — E6609 Other obesity due to excess calories: Secondary | ICD-10-CM

## 2022-02-17 LAB — POCT URINALYSIS DIPSTICK
Bilirubin, UA: NEGATIVE
Blood, UA: NEGATIVE
Glucose, UA: POSITIVE — AB
Leukocytes, UA: NEGATIVE
Nitrite, UA: NEGATIVE
Protein, UA: NEGATIVE
Spec Grav, UA: 1.025
Urobilinogen, UA: 0.2 U/dL
pH, UA: 5.5

## 2022-02-17 MED ORDER — LORATADINE 10 MG PO TABS: 10.0000 mg | ORAL_TABLET | Freq: Every day | ORAL | 2 refills | Status: DC

## 2022-02-17 NOTE — Patient Instructions (Signed)

## 2022-02-17 NOTE — Progress Notes (Signed)
Rich Brave [place]leton,acting as a Education administrator for Maximino Greenland, MD.,have documented all relevant documentation on the behalf of Maximino Greenland, MD,as directed by  Maximino Greenland, MD while in the presence of Maximino Greenland, MD.   Subjective:     Patient ID: Nicole Nunez , female    DOB: [birth year] , 76 y.o.   MRN: [id number]   Chief Complaint  Patient presents with   Annual Exam   Diabetes   Hypertension    HPI  Patient here for HM. Patient stated she no longer goes to a GYN. She reports compliance with meds.   She denies headaches, chest pain and shortness of breath. She has no specific concerns or complaints at this time.   Diabetes She presents for her follow-up diabetic visit. She has type 2 diabetes mellitus. Her disease course has been stable. There are no hypoglycemic associated symptoms. Pertinent negatives for diabetes include no blurred vision. There are no hypoglycemic complications. She is following a diabetic diet. She has not had a previous visit with a dietitian. She participates in exercise three times a week. An ACE inhibitor/angiotensin II receptor blocker is being taken. Eye exam is current.  Hypertension This is a chronic problem. The current episode started more than 1 year ago. The problem has been gradually improving since onset. The problem is controlled. Pertinent negatives include no blurred vision. The current treatment provides moderate improvement. Hypertensive end-organ damage includes kidney disease.     Past Medical History:  Diagnosis Date   CKD stage 3 due to type 2 diabetes mellitus (HCC)    DM (diabetes mellitus) ([place])    High cholesterol    HTN (hypertension)      Family History  Problem Relation Age of Onset   Asthma Mother    Hypertension Mother    Alzheimer's disease Mother    Early death Father      Current Outpatient Medications:    ascorbic acid (VITAMIN C) 500 MG tablet, Take 500 mg by mouth daily., Disp: , Rfl:    aspirin EC  81 MG tablet, Take 81 mg by mouth daily., Disp: , Rfl:    b complex vitamins tablet, Take 1 tablet by mouth daily., Disp: , Rfl:    Biotin 2500 MCG CAPS, Take 5,000 mg by mouth daily., Disp: , Rfl:    Calcium Carbonate (CALCIUM 600 PO), Take 1 tablet by mouth daily. , Disp: , Rfl:    Cholecalciferol (VITAMIN D3) 125 MCG (5000 UT) CAPS, Take 1 capsule by mouth daily. , Disp: , Rfl:    dapagliflozin propanediol (FARXIGA) 10 MG TABS tablet, Take 1 tablet (10 mg total) by mouth daily., Disp: 90 tablet, Rfl: 2   Lancet Devices (ONE TOUCH DELICA LANCING DEV) MISC, USE AS DIRECTED TO CHECK BLOOD SUGARS ONCE DAILY dx: e11.22, Disp: 1 each, Rfl: 2   loratadine (CLARITIN) 10 MG tablet, Take 1 tablet (10 mg total) by mouth daily., Disp: 30 tablet, Rfl: 2   magnesium gluconate (MAGONATE) 500 MG tablet, Take 500 mg by mouth daily., Disp: , Rfl:    Multiple Vitamin (MULTIVITAMIN) tablet, Take 1 tablet by mouth daily., Disp: , Rfl:    olmesartan (BENICAR) 20 MG tablet, TAKE 1 TABLET BY MOUTH  DAILY, Disp: 90 tablet, Rfl: 3   Omega-3 Fatty Acids (FISH OIL) 1000 MG CAPS, Take 1 capsule by mouth daily., Disp: , Rfl:    ONETOUCH VERIO test strip, Use to check blood sugars daily. E11.01, Disp: 100 strip,  Rfl: 3   pantoprazole (PROTONIX) 40 MG tablet, Take 1 tablet (40 mg total) by mouth daily., Disp: 90 tablet, Rfl: 1   Pitavastatin Calcium 4 MG TABS, Take 1 tablet by mouth daily, Disp: 90 tablet, Rfl: 3   Probiotic Product (PROBIOTIC PO), Take by mouth., Disp: , Rfl:    Semaglutide,0.25 or 0.5MG/DOS, (OZEMPIC, 0.25 OR 0.5 MG/DOSE,) 2 MG/3ML SOPN, Inject 0.70m SQ weekly., Disp: 3 mL, Rfl: 0   zinc gluconate 50 MG tablet, Take 50 mg by mouth daily., Disp: , Rfl:    Allergies  Allergen Reactions   Statins Other (See Comments)    Rosuvastatin, atorvastatin, pravastatin, simvastatin      The patient states she uses post menopausal status for birth control. Last LMP was No LMP recorded. Patient is postmenopausal..  Negative for Dysmenorrhea. Negative for: breast discharge, breast lump(s), breast pain and breast self exam. Associated symptoms include abnormal vaginal bleeding. Pertinent negatives include abnormal bleeding (hematology), anxiety, decreased libido, depression, difficulty falling sleep, dyspareunia, history of infertility, nocturia, sexual dysfunction, sleep disturbances, urinary incontinence, urinary urgency, vaginal discharge and vaginal itching. Diet regular.The patient states her exercise level is  intermittent.  . The patient's tobacco use is:  Social History   Tobacco Use  Smoking Status Never  Smokeless Tobacco Never  . She has been exposed to passive smoke. The patient's alcohol use is:  Social History   Substance and Sexual Activity  Alcohol Use Never   Review of Systems  Constitutional: Negative.   HENT:  Positive for postnasal drip and rhinorrhea.   Eyes: Negative.  Negative for blurred vision.  Respiratory: Negative.    Cardiovascular: Negative.   Gastrointestinal: Negative.   Endocrine: Negative.   Genitourinary: Negative.   Musculoskeletal: Negative.   Skin: Negative.   Allergic/Immunologic: Negative.   Neurological: Negative.   Hematological: Negative.   Psychiatric/Behavioral: Negative.       Today's Vitals   02/17/22 0958  BP: 122/76  Temp: 98.2 F (36.8 C)  Weight: 145 lb (65.8 kg)  Height: 4' 10.2" (1.478 m)  PainSc: 0-No pain   Body mass index is 30.1 kg/m.  Wt Readings from Last 3 Encounters:  02/17/22 145 lb (65.8 kg)  02/10/22 146 lb 12.8 oz (66.6 kg)  09/21/21 147 lb 6.4 oz (66.9 kg)     Objective:  Physical Exam Vitals and nursing note reviewed.  Constitutional:      Appearance: Normal appearance.  HENT:     Head: Normocephalic and atraumatic.     Right Ear: Tympanic membrane, ear canal and external ear normal.     Left Ear: Tympanic membrane, ear canal and external ear normal.     Mouth/Throat:     Pharynx: No oropharyngeal exudate  or posterior oropharyngeal erythema.  Eyes:     Extraocular Movements: Extraocular movements intact.     Conjunctiva/sclera: Conjunctivae normal.     Pupils: Pupils are equal, round, and reactive to light.  Cardiovascular:     Rate and Rhythm: Normal rate and regular rhythm.     Pulses: Normal pulses.          Dorsalis pedis pulses are 2+ on the right side and 2+ on the left side.     Heart sounds: Normal heart sounds.  Pulmonary:     Effort: Pulmonary effort is normal.     Breath sounds: Normal breath sounds.  Chest:  Breasts:    Tanner Score is 5.     Right: Normal.     Left:  Normal.  Abdominal:     General: Bowel sounds are normal.     Palpations: Abdomen is soft.     Comments: Rounded, soft  Genitourinary:    Comments: deferred Musculoskeletal:        General: Normal range of motion.     Cervical back: Normal range of motion and neck supple.  Feet:     Right foot:     Protective Sensation: 5 sites tested.  5 sites sensed.     Skin integrity: Dry skin present.     Toenail Condition: Right toenails are abnormally thick.     Left foot:     Protective Sensation: 5 sites tested.  5 sites sensed.     Skin integrity: Dry skin present.     Toenail Condition: Left toenails are abnormally thick.  Skin:    General: Skin is warm and dry.  Neurological:     General: No focal deficit present.     Mental Status: She is alert and oriented to person, place, and time.  Psychiatric:        Mood and Affect: Mood normal.        Behavior: Behavior normal.      Assessment And Plan:     1. Encounter for general adult medical examination w/o abnormal findings Comments: A full exam was performed. Importance of monthly self breast exams was discussed with the patient. PATIENT IS ADVISED TO GET 30-45 MINUTES REGULAR EXERCISE NO LESS THAN FOUR TO FIVE DAYS PER WEEK - BOTH WEIGHTBEARING EXERCISES AND AEROBIC ARE RECOMMENDED.  PATIENT IS ADVISED TO FOLLOW A HEALTHY DIET WITH AT LEAST SIX  FRUITS/VEGGIES PER DAY, DECREASE INTAKE OF RED MEAT, AND TO INCREASE FISH INTAKE TO TWO DAYS PER WEEK.  MEATS/FISH SHOULD NOT BE FRIED, BAKED OR BROILED IS PREFERABLE.  IT IS ALSO IMPORTANT TO CUT BACK ON YOUR SUGAR INTAKE. PLEASE AVOID ANYTHING WITH ADDED SUGAR, CORN SYRUP OR OTHER SWEETENERS. IF YOU MUST USE A SWEETENER, YOU CAN TRY STEVIA. IT IS ALSO IMPORTANT TO AVOID ARTIFICIALLY SWEETENERS AND DIET BEVERAGES. LASTLY, I SUGGEST WEARING SPF 50 SUNSCREEN ON EXPOSED PARTS AND ESPECIALLY WHEN IN THE DIRECT SUNLIGHT FOR AN EXTENDED PERIOD OF TIME.  PLEASE AVOID FAST FOOD RESTAURANTS AND INCREASE YOUR WATER INTAKE.  2. Type 2 diabetes mellitus with stage 3a chronic kidney disease, without long-term current use of insulin (HCC) Comments: Diabetic foot exam was performed. She will c/w Ozempic 0.83m weekly and Farxiga 168mdaily. She will rto in 4 months for re-evaluation.  I reviewed patient's medical record, external notes, lab results, and imaging reports. As I have discussed with the patient in detail, compliance with diabetes medications, meal planning, exercise, and self-monitoring regimens are essential to improving glucose control. Target HgbA1C is 7.0%. We discussed blood sugar goals including decreasing HgbA1C to target, fasting & premeal blood sugars between 80-130, and 2 hr post meal blood sugars <160. Emphasized that for every 1% that the HgbA1C is above this target range, risk of microvascular complications, including retinopathy, nephropathy, and neuropathy increase by 30%, and risk of macrovascular disease, including heart attack, peripheral vascular disease, and stroke increased by 15%. We discussed carbohydrates and their influence on blood sugars and cited examples of common higher carb foods to include junk food, bread, rice, pasta, corn, potatoes, fruit, and fruit juices to name a few. We discussed estimating carbohydrate portion sizes, the importance of meal planning and balancing carbohydrate  intake. The positive impact of an active lifestyle on diabetes and health  explained, and goals set to incorporate more activity and exercise into weekly schedule. Hypoglycemia recognition, prevention, and treatment discussed with patient. If patient's BG is low, discussed eating 15g of carbs and waiting 15 minutes and rechecking BG, until BG is above 70. If BG doesn't improve to normal, patient is to call office or doctor on call if after hours. - CBC - CMP14+EGFR - Hemoglobin A1c - Urine microalbumin-creatinine with uACR - POCT Urinalysis Dipstick (20355) - Amb Referral To Provider Referral Exercise Program (P.R.E.P)  3. 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  She will c/w olmesartan 58m EKG Performed, NSR w/o acute changes.  Encouraged to follow low sodium diet. F/u 6 months. - EKG 12-Lead - Amb Referral To Provider Referral Exercise Program (P.R.E.P)  4. Allergic rhinitis with postnasal drip Comments: I will send rx loratadine 178mdaily. She is encouraged to avoid dairy. She will let me know if her sx persist.   5. Decreased estrogen level Comments: I will refer her for bone density. She is encouraged to c/w calcium/vitamin D supplementation and engage in weight-bearing exercises 3 days/week.  - DG Bone Density; Future  6. Pure hypercholesterolemia Comments: Chronic, LDL goal <70. She is intolerant of most statins, tolerates Livalo. It is too expensive, has tried pt assistance.  - Amb Referral To Provider Referral Exercise Program (P.R.E.P)  7. Class 1 obesity due to excess calories with serious comorbidity and body mass index (BMI) of 30.0 to 30.9 in adult Comments: Her BMI is acceptable for her demographic.  - Amb Referral To Provider Referral Exercise Program (P.R.E.P)  8. Immunization due - Flu Vaccine QUAD High Dose(Fluad)  Patient was given opportunity to ask questions. Patient  verbalized understanding of the plan and was able to repeat key elements of the plan. All questions were answered to their satisfaction.   I, RoMaximino GreenlandMD, have reviewed all documentation for this visit. The documentation on 02/17/22 for the exam, diagnosis, procedures, and orders are all accurate and complete.   THE PATIENT IS ENCOURAGED TO PRACTICE SOCIAL DISTANCING DUE TO THE COVID-19 PANDEMIC.

## 2022-02-18 ENCOUNTER — Telehealth: Payer: Self-pay | Admitting: *Deleted

## 2022-02-18 LAB — MICROALBUMIN / CREATININE URINE RATIO
Creatinine, Urine: 169.5 mg/dL
Microalb/Creat Ratio: 7 mg/g creat (ref 0–29)
Microalbumin, Urine: 12.1 ug/mL

## 2022-02-18 LAB — CBC
Hematocrit: 38.4 % (ref 34.0–46.6)
Hemoglobin: 12.5 g/dL (ref 11.1–15.9)
MCH: 27.2 pg (ref 26.6–33.0)
MCHC: 32.6 g/dL (ref 31.5–35.7)
MCV: 84 fL (ref 79–97)
Platelets: 309 10*3/uL (ref 150–450)
RBC: 4.59 x10E6/uL (ref 3.77–5.28)
RDW: 13.7 % (ref 11.7–15.4)
WBC: 6.7 10*3/uL (ref 3.4–10.8)

## 2022-02-18 LAB — CMP14+EGFR
ALT: 21 IU/L (ref 0–32)
AST: 28 IU/L (ref 0–40)
Albumin/Globulin Ratio: 1.6 (ref 1.2–2.2)
Albumin: 4.6 g/dL (ref 3.8–4.8)
Alkaline Phosphatase: 120 IU/L (ref 44–121)
BUN/Creatinine Ratio: 19 (ref 12–28)
BUN: 23 mg/dL (ref 8–27)
Bilirubin Total: 0.6 mg/dL (ref 0.0–1.2)
CO2: 23 mmol/L (ref 20–29)
Calcium: 9.7 mg/dL (ref 8.7–10.3)
Chloride: 104 mmol/L (ref 96–106)
Creatinine, Ser: 1.24 mg/dL — ABNORMAL HIGH (ref 0.57–1.00)
Globulin, Total: 2.8 g/dL (ref 1.5–4.5)
Glucose: 107 mg/dL — ABNORMAL HIGH (ref 70–99)
Potassium: 4.3 mmol/L (ref 3.5–5.2)
Sodium: 144 mmol/L (ref 134–144)
Total Protein: 7.4 g/dL (ref 6.0–8.5)
eGFR: 46 mL/min/{1.73_m2} — ABNORMAL LOW (ref >59)

## 2022-02-18 LAB — HEMOGLOBIN A1C
Est. average glucose Bld gHb Est-mCnc: 154 mg/dL
Hgb A1c MFr Bld: 7 % — ABNORMAL HIGH (ref 4.8–5.6)

## 2022-02-18 NOTE — Telephone Encounter (Signed)
Patient contacted regarding PREP class referral. Had left voice message and she called me back within several minutes. She is interested in participating at the [facility]. We will call her back with class availability Fall/ Winter 2023.

## 2022-02-22 ENCOUNTER — Telehealth: Payer: Self-pay

## 2022-02-22 NOTE — Chronic Care Management (AMB) (Signed)
02-22-2022: Contacted [employer] to follow up on patient's Farxiga. Patient's farxiga was approved and processed on 02-14-2022. Medication will ship to patients home 10-14 days. Contacted [employer] to follow up on Livalo and was informed application was not received. Informed [name] to refax application to [phone] or [phone].  [place] Pharmacist Assistant [phone]

## 2022-02-22 NOTE — Chronic Care Management (AMB) (Signed)
[employer] Notification:  Patient was approved to receive Farxiga 10 mg through [employer] patient assistance program from 02/07/2022 - 05/22/2022.   [name], [place] Pharmacist Assistant [phone]

## 2022-03-07 ENCOUNTER — Other Ambulatory Visit: Payer: Self-pay

## 2022-03-07 MED ORDER — PANTOPRAZOLE SODIUM 40 MG PO TBEC: 40.0000 mg | DELAYED_RELEASE_TABLET | Freq: Every day | ORAL | 1 refills | Status: DC

## 2022-03-09 ENCOUNTER — Telehealth: Payer: Self-pay

## 2022-03-09 NOTE — Chronic Care Management (AMB) (Addendum)
[employer] Notification:  [name] 10 mg was shipped on 02/14/2022, allow 1-2 business days for shipment to arrive. Shipment was sent via [employer], Tracking number [id number].   Patient approved for 2024 Enrollment with [employer] Patient Assistance Program for Farxiga 10 mg, enrollment will end on May 23, 2023.    [name] patient assistance program notification:  120- day supply of Ozempic 0.25/0.5 mg was filled on 02/15/2022 and should arrive to the office in 10-14 business days. Patient has 0  refill remaining and enrollment will expire on 05/22/2022.  No further action required, patient due for 2024 re-enrollment.     [name], [place] Pharmacist Assistant [phone]

## 2022-03-11 ENCOUNTER — Ambulatory Visit: Payer: Medicare Other

## 2022-04-01 ENCOUNTER — Other Ambulatory Visit: Payer: Self-pay | Admitting: Internal Medicine

## 2022-04-08 ENCOUNTER — Telehealth: Payer: Self-pay

## 2022-04-08 NOTE — Telephone Encounter (Signed)
Call to pt reference starting PREP on 04/19/22 T/TH 230p-345p at [facility]. Can start then.  Intake scheduled for 11/21 at 315pm. Will meet her at the front desk.

## 2022-04-18 NOTE — Progress Notes (Signed)
[facility] PREP Evaluation  Patient Details  Name: Nicole Nunez MRN: [id number] Date of Birth: Mar 26, 1946 Age: 76 y.o. PCP: Glendale Chard, MD  Vitals:   04/18/22 1626  BP: 124/70  Pulse: 83  SpO2: 98%  Weight: 150 lb 12.8 oz (68.4 kg)     [facility] Eval - 04/18/22 1600       [facility] "PREP" Location   [facility] "PREP" Location [name]      Referral    Referring Provider [place]    Reason for referral Inactivity;Hypertension;High Cholesterol;Diabetes;Obesitity/Overweight    Program Start Date 04/19/22   T/TH 230p-345p x 12 wks     Measurement   Waist Circumference 38 inches    Hip Circumference 40.5 inches    Body fat 46.1 percent      Information for Trainer   Goals Get A1C lower, better eating habits, get back to exercise    Current Exercise none    Orthopedic Concerns none    Pertinent Medical History HTN, DM2, CKD3, Inc chol    Current Barriers none    Restrictions/Precautions Diabetic snack before exercise;Fall risk   recent trip over shoes   Medications that affect exercise Medication causing dizziness/drowsiness      Timed Up and Go (TUGS)   Timed Up and Go Low risk <9 seconds      Mobility and Daily Activities   I find it easy to walk up or down two or more flights of stairs. 3    I have no trouble taking out the trash. 4    I do housework such as vacuuming and dusting on my own without difficulty. 4    I can easily lift a gallon of milk (8lbs). 4    I can easily walk a mile. 4    I have no trouble reaching into high cupboards or reaching down to pick up something from the floor. 4    I do not have trouble doing out-door work such as Armed forces logistics/support/administrative officer, raking leaves, or gardening. 4      Mobility and Daily Activities   I feel younger than my age. 4    I feel independent. 4    I feel energetic. 3    I live an active life.  4    I feel strong. 4    I feel healthy. 3    I feel active as other people my age. 4      How fit and strong are you.   Fit and  Strong Total Score 53            Past Medical History:  Diagnosis Date   CKD stage 3 due to type 2 diabetes mellitus ([place])    DM (diabetes mellitus) ([place])    High cholesterol    HTN (hypertension)    Past Surgical History:  Procedure Laterality Date   CATARACT EXTRACTION, BILATERAL  01/2018   first was 12/2017   corn removal Bilateral 06/2017   Social History   Tobacco Use  Smoking Status Never  Smokeless Tobacco Never    Barnett Hatter 04/18/2022, 4:33 PM

## 2022-04-20 DIAGNOSIS — E785 Hyperlipidemia, unspecified: Secondary | ICD-10-CM | POA: Diagnosis not present

## 2022-04-21 ENCOUNTER — Ambulatory Visit
Admission: RE | Admit: 2022-04-21 | Discharge: 2022-04-21 | Disposition: A | Discharge status: Home / Self Care | Payer: Medicare Other | Source: Ambulatory Visit | Attending: Internal Medicine | Admitting: Internal Medicine

## 2022-04-21 DIAGNOSIS — Z1231 Encounter for screening mammogram for malignant neoplasm of breast: Secondary | ICD-10-CM | POA: Diagnosis not present

## 2022-04-21 LAB — NMR, LIPOPROFILE
Cholesterol, Total: 150 mg/dL (ref 100–199)
HDL Particle Number: 37.7 umol/L (ref >=30.5)
HDL-C: 66 mg/dL (ref >39)
LDL Particle Number: 845 nmol/L (ref <1000)
LDL Size: 21 nm (ref >20.5)
LDL-C (NIH Calc): 72 mg/dL (ref 0–99)
LP-IR Score: <25 (ref <=45)
Small LDL Particle Number: 329 nmol/L (ref <=527)
Triglycerides: 61 mg/dL (ref 0–149)

## 2022-04-22 ENCOUNTER — Encounter (HOSPITAL_BASED_OUTPATIENT_CLINIC_OR_DEPARTMENT_OTHER): Payer: Self-pay | Admitting: Internal Medicine

## 2022-04-22 ENCOUNTER — Ambulatory Visit (HOSPITAL_BASED_OUTPATIENT_CLINIC_OR_DEPARTMENT_OTHER): Payer: Medicare Other | Admitting: Internal Medicine

## 2022-04-22 VITALS — BP 128/64 | HR 80 | Ht <= 58 in | Wt 149.0 lb

## 2022-04-22 DIAGNOSIS — T466X5D Adverse effect of antihyperlipidemic and antiarteriosclerotic drugs, subsequent encounter: Secondary | ICD-10-CM | POA: Diagnosis not present

## 2022-04-22 DIAGNOSIS — E785 Hyperlipidemia, unspecified: Secondary | ICD-10-CM | POA: Diagnosis not present

## 2022-04-22 DIAGNOSIS — E559 Vitamin D deficiency, unspecified: Secondary | ICD-10-CM | POA: Diagnosis not present

## 2022-04-22 DIAGNOSIS — G720 Drug-induced myopathy: Secondary | ICD-10-CM

## 2022-04-22 DIAGNOSIS — T466X5A Adverse effect of antihyperlipidemic and antiarteriosclerotic drugs, initial encounter: Secondary | ICD-10-CM

## 2022-04-22 MED ORDER — PITAVASTATIN MAGNESIUM 4 MG PO TABS: 1.0000 | ORAL_TABLET | Freq: Every day | ORAL | 11 refills | Status: DC

## 2022-04-22 NOTE — Patient Instructions (Signed)
Medication Instructions:  Prescription for Zyptimag '4mg'$  has been sent to [facility] -- mail order pharmacy out of [facility]   *If you need a refill on your cardiac medications before your next appointment, please call your pharmacy*   Lab Work: FASTING lab work to check cholesterol in 6 months -- complete about 1 week before next appointment   If you have labs (blood work) drawn today and your tests are completely normal, you will receive your results only by: [facility] (if you have MyChart) OR A paper copy in the mail If you have any lab test that is abnormal or we need to change your treatment, we will call you to review the results.   Testing/Procedures: NONE   Follow-Up: At [facility], you and your health needs are our priority.  As part of our continuing mission to provide you with exceptional heart care, we have created designated Provider Care Teams.  These Care Teams include your primary Cardiologist (physician) and Advanced Practice Providers (APPs -  Physician Assistants and Nurse Practitioners) who all work together to provide you with the care you need, when you need it.  We recommend signing up for the patient portal called "MyChart".  Sign up information is provided on this After Visit Summary.  MyChart is used to connect with patients for Virtual Visits (Telemedicine).  Patients are able to view lab/test results, encounter notes, upcoming appointments, etc.  Non-urgent messages can be sent to your provider as well.   To learn more about what you can do with MyChart, go to [email or web address].    Your next appointment:   6 month(s)  The format for your next appointment:   In Person  Provider:   Lyman Bishop MD

## 2022-04-22 NOTE — Progress Notes (Signed)
LIPID CLINIC CONSULT NOTE  Chief Complaint:  Follow-up dyslipidemia  Primary Care Physician: Glendale Chard, MD  Primary Cardiologist:  None  HPI:  Nicole Nunez is a 76 y.o. female who is being seen today for the evaluation of dyslipidemia at the request of Glendale Chard, MD. this is a pleasant 76 year old female kindly referred for evaluation and management of dyslipidemia by Dr. Baird Cancer.  She has a history of statin intolerance including rosuvastatin, atorvastatin, pravastatin and others.  In the past she is only been able to tolerate Livalo.  She has had been on 4 milligrams every day.  Previous cholesterol showed total 148, triglycerides 58, HDL 66 and LDL 70, which would be at target for her as a diabetic, however a subsequent lipid profile in January showed total cholesterol 185, triglycerides 68, HDL 64 and LDL 108.  She said she had not discontinue the medicine at that time however had significantly changed her diet and was not eating as healthy or exercising regularly.  She actually has run into issues with the cost of the medication.  She has been prescribed samples by her primary care provider however was noted to not be able to be sampled for ever.  She was therefore referred to the lipid clinic for options.  06/08/2021  Nicole Nunez returns today for follow-up.  Overall she says she is doing well.  She has been able to get Livalo per insurance and is taking 4 mg 3 times a week.  Despite this her cholesterol is going up.  I suspect this was due to some weight gain and indulgence during the holidays.  LDL cholesterol is gone from 75 up to 101 with an LDL particle number from 705 up to 1036.  Blood pressures well controlled.  She remains asymptomatic without any chest pain or worsening shortness of breath.  04/22/2022  Nicole Nunez is seen today in follow-up.  She reports that she initially had been taking Livalo 4 mg daily but more recently switched to every other day because  of cost issues.  Her lipids are improved with LDL particle number of 845, LDL of 72 and the rest of her lipids were within normal limits.  PMHx:  Past Medical History:  Diagnosis Date   CKD stage 3 due to type 2 diabetes mellitus (HCC)    DM (diabetes mellitus) ([place])    High cholesterol    HTN (hypertension)     Past Surgical History:  Procedure Laterality Date   CATARACT EXTRACTION, BILATERAL  01/2018   first was 12/2017   corn removal Bilateral 06/2017    FAMHx:  Family History  Problem Relation Age of Onset   Asthma Mother    Hypertension Mother    Alzheimer's disease Mother    Early death Father     SOCHx:   reports that she has never smoked. She has never used smokeless tobacco. She reports that she does not drink alcohol and does not use drugs.  ALLERGIES:  Allergies  Allergen Reactions   Statins Other (See Comments)    Rosuvastatin, atorvastatin, pravastatin, simvastatin    ROS: Pertinent items noted in HPI and remainder of comprehensive ROS otherwise negative.  HOME MEDS: Current Outpatient Medications on File Prior to Visit  Medication Sig Dispense Refill   ascorbic acid (VITAMIN C) 500 MG tablet Take 500 mg by mouth daily.     aspirin EC 81 MG tablet Take 81 mg by mouth daily.     b complex vitamins tablet  Take 1 tablet by mouth daily.     Biotin 2500 MCG CAPS Take 5,000 mg by mouth daily.     Calcium Carbonate (CALCIUM 600 PO) Take 1 tablet by mouth daily.      Cholecalciferol (VITAMIN D3) 125 MCG (5000 UT) CAPS Take 1 capsule by mouth daily.      dapagliflozin propanediol (FARXIGA) 10 MG TABS tablet Take 1 tablet (10 mg total) by mouth daily. 90 tablet 2   Lancet Devices (ONE TOUCH DELICA LANCING DEV) MISC USE AS DIRECTED TO CHECK BLOOD SUGARS ONCE DAILY dx: e11.22 1 each 2   loratadine (CLARITIN) 10 MG tablet Take 1 tablet (10 mg total) by mouth daily. 30 tablet 2   magnesium gluconate (MAGONATE) 500 MG tablet Take 500 mg by mouth daily.     Multiple  Vitamin (MULTIVITAMIN) tablet Take 1 tablet by mouth daily.     olmesartan (BENICAR) 20 MG tablet TAKE 1 TABLET BY MOUTH DAILY 100 tablet 2   Omega-3 Fatty Acids (FISH OIL) 1000 MG CAPS Take 1 capsule by mouth daily.     ONETOUCH VERIO test strip Use to check blood sugars daily. E11.01 100 strip 3   pantoprazole (PROTONIX) 40 MG tablet Take 1 tablet (40 mg total) by mouth daily. 90 tablet 1   Pitavastatin Calcium 4 MG TABS Take 1 tablet by mouth daily 90 tablet 3   Probiotic Product (PROBIOTIC PO) Take by mouth.     Semaglutide,0.25 or 0.'5MG'$ /DOS, (OZEMPIC, 0.25 OR 0.5 MG/DOSE,) 2 MG/3ML SOPN Inject 0.'5mg'$  SQ weekly. 3 mL 0   zinc gluconate 50 MG tablet Take 50 mg by mouth daily.     No current facility-administered medications on file prior to visit.    LABS/IMAGING: No results found for this or any previous visit (from the past 48 hour(s)). MM 3D SCREEN BREAST BILATERAL  Result Date: 04/22/2022 CLINICAL DATA:  Screening. EXAM: DIGITAL SCREENING BILATERAL MAMMOGRAM WITH TOMOSYNTHESIS AND CAD TECHNIQUE: Bilateral screening digital craniocaudal and mediolateral oblique mammograms were obtained. Bilateral screening digital breast tomosynthesis was performed. The images were evaluated with computer-aided detection. COMPARISON:  Previous exam(s). ACR Breast Density Category b: There are scattered areas of fibroglandular density. FINDINGS: In the left breast, a possible mass warrants further evaluation. In the right breast, no findings suspicious for malignancy. IMPRESSION: Further evaluation is suggested for a possible mass in the left breast. RECOMMENDATION: Diagnostic mammogram and possibly ultrasound of the left breast. (Code:[id number]) The patient will be contacted regarding the findings, and additional imaging will be scheduled. BI-RADS CATEGORY  0: Incomplete. Need additional imaging evaluation and/or prior mammograms for comparison. Electronically Signed   By: Evangeline Dakin M.D.   On: 04/22/2022  10:51    LIPID PANEL:    Component Value Date/Time   CHOL 185 06/02/2020 0942   TRIG 68 06/02/2020 0942   HDL 64 06/02/2020 0942   CHOLHDL 2.9 06/02/2020 0942   LDLCALC 108 (H) 06/02/2020 0942    WEIGHTS: Wt Readings from Last 3 Encounters:  04/22/22 149 lb (67.6 kg)  04/18/22 150 lb 12.8 oz (68.4 kg)  02/17/22 145 lb (65.8 kg)    VITALS: Ht '4\' 10"'$  (1.473 m)   Wt 149 lb (67.6 kg)   BMI 31.14 kg/m   EXAM: Deferred  EKG: Deferred  ASSESSMENT: Mixed dyslipidemia, goal LDL less than 70 Type 2 diabetes with moderate to high 10-year risk Vitamin D deficiency Statin intolerance-myalgias  PLAN: 1.   Ms. Stann Nunez has had normalization of her lipids but has  decreased recently to every other day therapy again.  She tolerated daily therapy.  Cost is an issue with her monthly cost being over $200.  Will go ahead and prescribe her Zyptimag which she can get from [facility] for less than $40 a month.  Plan repeat lipids in 6 months and follow-up then.  Pixie Casino, MD, [facility], [place] Director of the Advanced Lipid Disorders &  Cardiovascular Risk Reduction Clinic Diplomate of the American Board of Clinical Lipidology Attending Cardiologist  Direct Dial: [phone]  Fax: [phone]  Website:  [email or web address] 04/22/2022, 3:35 PM

## 2022-04-25 ENCOUNTER — Other Ambulatory Visit: Payer: Self-pay | Admitting: Internal Medicine

## 2022-04-25 DIAGNOSIS — S90122A Contusion of left lesser toe(s) without damage to nail, initial encounter: Secondary | ICD-10-CM | POA: Diagnosis not present

## 2022-04-25 DIAGNOSIS — M2142 Flat foot [pes planus] (acquired), left foot: Secondary | ICD-10-CM | POA: Diagnosis not present

## 2022-04-25 DIAGNOSIS — B351 Tinea unguium: Secondary | ICD-10-CM | POA: Diagnosis not present

## 2022-04-25 DIAGNOSIS — L84 Corns and callosities: Secondary | ICD-10-CM | POA: Diagnosis not present

## 2022-04-25 DIAGNOSIS — E118 Type 2 diabetes mellitus with unspecified complications: Secondary | ICD-10-CM | POA: Diagnosis not present

## 2022-04-25 DIAGNOSIS — R928 Other abnormal and inconclusive findings on diagnostic imaging of breast: Secondary | ICD-10-CM

## 2022-04-26 ENCOUNTER — Telehealth: Payer: Self-pay

## 2022-04-26 NOTE — Chronic Care Management (AMB) (Signed)
Faxed 2024 re-enrollment application to [employer] patient assistance for Ozempic.   [name], [place] Pharmacist Assistant [phone]

## 2022-04-27 ENCOUNTER — Telehealth: Payer: Self-pay

## 2022-04-27 NOTE — Chronic Care Management (AMB) (Signed)
04-27-2022: Contacted patient to follow up on 2024 farxiga enrollment. Patient stated she received  an approval letter for next year.  [place] Pharmacist Assistant [phone]

## 2022-05-02 NOTE — Progress Notes (Signed)
[facility] PREP Weekly Session  Patient Details  Name: KERYL GHOLSON MRN: [id number] Date of Birth: 1945/08/31 Age: 76 y.o. PCP: Glendale Chard, MD  Vitals:   04/26/22 1430  Weight: 149 lb 3.2 oz (67.7 kg)     [facility] Weekly seesion - 05/02/22 0900       [facility] "PREP" Location   [facility] "PREP" Location [name]      Weekly Session   Topic Discussed Importance of resistance training;Other ways to be active    Minutes exercised this week 140 minutes    Classes attended to date [place] 05/02/2022, 9:44 AM

## 2022-05-03 NOTE — Progress Notes (Signed)
[facility] PREP Weekly Session  Patient Details  Name: Nicole Nunez MRN: [id number] Date of Birth: [birth year] Age: 76 y.o. PCP: Glendale Chard, MD  Vitals:   05/03/22 1430  Weight: 151 lb 12.8 oz (68.9 kg)     [facility] Weekly seesion - 05/03/22 1600       [facility] "PREP" Location   [facility] "PREP" Location [name]      Weekly Session   Topic Discussed Healthy eating tips    Minutes exercised this week 120 minutes    Classes attended to date [place] 05/03/2022, 4:38 PM

## 2022-05-04 ENCOUNTER — Ambulatory Visit
Admission: RE | Admit: 2022-05-04 | Discharge: 2022-05-04 | Disposition: A | Discharge status: Home / Self Care | Payer: Medicare Other | Source: Ambulatory Visit | Attending: Internal Medicine | Admitting: Internal Medicine

## 2022-05-04 ENCOUNTER — Other Ambulatory Visit: Payer: Self-pay | Admitting: Internal Medicine

## 2022-05-04 DIAGNOSIS — R928 Other abnormal and inconclusive findings on diagnostic imaging of breast: Secondary | ICD-10-CM

## 2022-05-04 DIAGNOSIS — N6323 Unspecified lump in the left breast, lower outer quadrant: Secondary | ICD-10-CM | POA: Diagnosis not present

## 2022-05-04 DIAGNOSIS — N6320 Unspecified lump in the left breast, unspecified quadrant: Secondary | ICD-10-CM

## 2022-05-04 DIAGNOSIS — N6324 Unspecified lump in the left breast, lower inner quadrant: Secondary | ICD-10-CM | POA: Diagnosis not present

## 2022-05-11 NOTE — Progress Notes (Signed)
[facility] PREP Weekly Session  Patient Details  Name: Nicole Nunez MRN: [id number] Date of Birth: 15-Feb-1946 Age: 76 y.o. PCP: Glendale Chard, MD  Vitals:   05/10/22 1430  Weight: 150 lb (68 kg)     [facility] Weekly seesion - 05/11/22 1100       [facility] "PREP" Location   [facility] "PREP" [facility]      Weekly Session   Topic Discussed Health habits    Minutes exercised this week 150 minutes    Classes attended to date [name] 05/11/2022, 11:45 AM

## 2022-05-15 ENCOUNTER — Other Ambulatory Visit: Payer: Self-pay | Admitting: Internal Medicine

## 2022-05-17 NOTE — Progress Notes (Signed)
[facility] PREP Weekly Session  Patient Details  Name: Nicole Nunez MRN: [id number] Date of Birth: 1946-03-11 Age: 76 y.o. PCP: Glendale Chard, MD  Vitals:   05/17/22 1613  Weight: 150 lb 3.2 oz (68.1 kg)     [facility] Weekly seesion - 05/17/22 1600       [facility] "PREP" Location   [facility] "PREP" [facility]      Weekly Session   Topic Discussed Restaurant Eating   salt demo   Minutes exercised this week 130 minutes    Classes attended to date [place] 05/17/2022, 4:14 PM

## 2022-05-18 ENCOUNTER — Ambulatory Visit
Admission: RE | Admit: 2022-05-18 | Discharge: 2022-05-18 | Disposition: A | Discharge status: Home / Self Care | Payer: Medicare Other | Source: Ambulatory Visit | Attending: Internal Medicine | Admitting: Internal Medicine

## 2022-05-18 DIAGNOSIS — C50812 Malignant neoplasm of overlapping sites of left female breast: Secondary | ICD-10-CM | POA: Diagnosis not present

## 2022-05-18 DIAGNOSIS — C50919 Malignant neoplasm of unspecified site of unspecified female breast: Secondary | ICD-10-CM

## 2022-05-18 DIAGNOSIS — N6325 Unspecified lump in the left breast, overlapping quadrants: Secondary | ICD-10-CM | POA: Diagnosis not present

## 2022-05-18 DIAGNOSIS — N6320 Unspecified lump in the left breast, unspecified quadrant: Secondary | ICD-10-CM

## 2022-05-18 HISTORY — PX: BREAST BIOPSY: SHX20

## 2022-05-18 HISTORY — DX: Malignant neoplasm of unspecified site of unspecified female breast: C50.919

## 2022-05-27 ENCOUNTER — Ambulatory Visit: Payer: Self-pay | Admitting: Surgery

## 2022-05-27 DIAGNOSIS — C50912 Malignant neoplasm of unspecified site of left female breast: Secondary | ICD-10-CM

## 2022-05-27 DIAGNOSIS — Z170 Estrogen receptor positive status [ER+]: Secondary | ICD-10-CM | POA: Diagnosis not present

## 2022-05-27 DIAGNOSIS — C50412 Malignant neoplasm of upper-outer quadrant of left female breast: Secondary | ICD-10-CM | POA: Diagnosis not present

## 2022-05-31 ENCOUNTER — Encounter: Payer: Self-pay | Admitting: *Deleted

## 2022-05-31 DIAGNOSIS — Z170 Estrogen receptor positive status [ER+]: Secondary | ICD-10-CM | POA: Insufficient documentation

## 2022-06-01 ENCOUNTER — Other Ambulatory Visit: Payer: Self-pay | Admitting: Surgery

## 2022-06-01 DIAGNOSIS — C50912 Malignant neoplasm of unspecified site of left female breast: Secondary | ICD-10-CM

## 2022-06-01 NOTE — Progress Notes (Signed)
New Breast Cancer Diagnosis: Left Breast UOQ  Did patient present with symptoms (if so, please note symptoms) or screening mammography?:Screening Mass    Location and Extent of disease :left breast. Located in the upper outer quadrant, measured 9 mm in greatest dimension. Adenopathy no.  Histology per Pathology Report: grade 2, Invasive Ductal Carcinoma 05/18/2022  Receptor Status: ER(positive), PR (positive), Her2-neu (negative), Ki-(50%)   Surgeon and surgical plan, if any:  Dr. Brantley Stage 05/27/2022 -She has opted for left breast seed localized lumpectomy with left axillary SLN mapping. -Refer to medical and radiation oncology.   Medical oncologist, treatment if any:    Family History of Breast/Ovarian/Prostate Cancer:   Lymphedema issues, if any:      Pain issues, if any:     SAFETY ISSUES: Prior radiation?  Pacemaker/ICD?  Possible current pregnancy? Postmenopausal Is the patient on methotrexate?   Current Complaints / other details:

## 2022-06-01 NOTE — Therapy (Signed)
OUTPATIENT PHYSICAL THERAPY BREAST CANCER BASELINE EVALUATION   Patient Name: Nicole Nunez MRN: [id number] DOB:11-16-1945, 77 y.o., female Today's Date: 06/02/2022  END OF SESSION:  PT End of Session - 06/02/22 1219     Visit Number 1    Number of Visits 2    Date for PT Re-Evaluation 07/22/22    PT Start Time 0900    PT Stop Time 0933    PT Time Calculation (min) 33 min    Activity Tolerance Patient tolerated treatment well    Behavior During Therapy WFL for tasks assessed/performed             Past Medical History:  Diagnosis Date   CKD stage 3 due to type 2 diabetes mellitus ([place])    DM (diabetes mellitus) ([place])    High cholesterol    HTN (hypertension)    Past Surgical History:  Procedure Laterality Date   BREAST BIOPSY Left 05/18/2022   [place] LT BREAST BX W LOC DEV 1ST LESION IMG BX SPEC US GUIDE 05/18/2022 [facility] M[facility]MO[facility]AP[facility]   CATARACT EXTRACTION, BILATERAL  01/2018   first was 12/2017   corn removal Bilateral 06/2017   Patient Active Problem List   Diagnosis Date Noted   Malignant neoplasm of lower-outer quadrant of left breast of female, estrogen receptor positive ([place]) 05/31/2022   Statin myopathy 02/04/2021   Notalgia 01/29/2020   Vitamin D deficiency disease 01/29/2020   Class 1 obesity due to excess calories with serious comorbidity and body mass index (BMI) of 32.0 to 32.9 in adult 01/29/2020   Type 2 diabetes mellitus with stage 3 chronic kidney disease, without long-term current use of insulin ([place]) 05/24/2018   Chronic renal disease, stage III ([place]) 05/24/2018   Parenchymal renal hypertension 05/24/2018   Pure hypercholesterolemia 05/24/2018   Class 1 obesity due to excess calories with serious comorbidity and body mass index (BMI) of 31.0 to 31.9 in adult 05/24/2018    PCP: Dr. Glendale Chard  REFERRING PROVIDER: Dr. Brantley Stage   REFERRING DIAG: left breast cancer  THERAPY DIAG:  Malignant neoplasm of lower-outer quadrant of left breast of  female, estrogen receptor positive ([place])  Abnormal posture  Rationale for Evaluation and Treatment: Rehabilitation  ONSET DATE: 05/27/22  SUBJECTIVE:                                                                                                                                                                                           SUBJECTIVE STATEMENT: Patient reports she is here today to be seen by her medical team for her newly diagnosed left breast cancer.   PERTINENT HISTORY:  Patient was  diagnosed left IDC. It measures 82m and is located in the upper outer quadrant. It is ER/PR positive with a Ki67 of 50%. Will be having a lumpectomy and SLNB on 06/21/22.    PATIENT GOALS:   reduce lymphedema risk and learn post op HEP.   PAIN:  Are you having pain? No  PRECAUTIONS: Active CA   HAND DOMINANCE: right  WEIGHT BEARING RESTRICTIONS: No  FALLS:  Has patient fallen in last 6 months? No  LIVING ENVIRONMENT: Patient lives with: my children Lives in: House/apartment Has following equipment at home: none  OCCUPATION: retired  LEISURE: crochet, make wool dryer balls  PRIOR LEVEL OF FUNCTION: Independent   OBJECTIVE:  COGNITION: Overall cognitive status: Within functional limits for tasks assessed    POSTURE:  Forward head and rounded shoulders posture  UPPER EXTREMITY AROM/PROM:  A/PROM LEFT   eval  Shoulder extension 60  Shoulder flexion 160  Shoulder abduction 160  Shoulder internal rotation   Shoulder external rotation 90    (Blank rows = not tested)  LYMPHEDEMA ASSESSMENTS:   LANDMARK RIGHT   eval  10 cm proximal to olecranon process 31  Olecranon process 25  10 cm proximal to ulnar styloid process 21.5  Just proximal to ulnar styloid process 16.4  Across hand at thumb web space 19.4  At base of 2nd digit 6.7  (Blank rows = not tested)  LANDMARK LEFT   eval  10 cm proximal to olecranon process 31  Olecranon process 25  10 cm proximal to ulnar  styloid process 20  Just proximal to ulnar styloid process 16.3  Across hand at thumb web space 19  At base of 2nd digit 6.2  (Blank rows = not tested)  L-DEX LYMPHEDEMA SCREENING: The patient was assessed using the L-Dex machine today to produce a lymphedema index baseline score. The patient will be reassessed on a regular basis (typically every 3 months) to obtain new L-Dex scores. If the score is > 6.5 points away from his/her baseline score indicating onset of subclinical lymphedema, it will be recommended to wear a compression garment for 4 weeks, 12 hours per day and then be reassessed. If the score continues to be > 6.5 points from baseline at reassessment, we will initiate lymphedema treatment. Assessing in this manner has a 95% rate of preventing clinically significant lymphedema.  QUICK DASH SURVEY: 0%  PATIENT EDUCATION:  Education details: Lymphedema risk reduction and post op shoulder/posture HEP Person educated: Patient Education method: Explanation, Demonstration, Handout Education comprehension: Patient verbalized understanding and returned demonstration  HOME EXERCISE PROGRAM: Patient was instructed today in a home exercise program today for post op shoulder range of motion. These included active assist shoulder flexion in sitting, scapular retraction, wall walking with shoulder abduction, and hands behind head external rotation.  She was encouraged to do these twice a day, holding 3 seconds and repeating 5 times when permitted by her physician.   ASSESSMENT:  CLINICAL IMPRESSION: Pt will benefit from a post op PT reassessment to determine needs and from L-Dex screens every 3 months for 2 years to detect subclinical lymphedema.  Pt will benefit from skilled therapeutic intervention to improve on the following deficits: Decreased knowledge of precautions, impaired UE functional use, pain, decreased ROM, postural dysfunction.   PT treatment/interventions: ADL/self-care  home management, pt/family education, therapeutic exercise  REHAB POTENTIAL: Excellent  CLINICAL DECISION MAKING: Stable/uncomplicated  EVALUATION COMPLEXITY: Low   GOALS: Goals reviewed with patient? YES  LONG TERM GOALS: (STG=LTG)  Name Target Date Goal status  1 Pt will be able to verbalize understanding of pertinent lymphedema risk reduction practices relevant to her dx specifically related to skin care.  Baseline:  No knowledge 06/02/2022 Achieved at eval  2 Pt will be able to return demo and/or verbalize understanding of the post op HEP related to regaining shoulder ROM. Baseline:  No knowledge 06/02/2022 Achieved at eval  3 Pt will be able to verbalize understanding of the importance of attending the post op After Breast CA Class for further lymphedema risk reduction education and therapeutic exercise.  Baseline:  No knowledge 06/02/2022 Achieved at eval  4 Pt will demo she has regained full shoulder ROM and function post operatively compared to baselines.  Baseline: See objective measurements taken today. 07/22/22     PLAN:  PT FREQUENCY/DURATION: EVAL and 1 follow up appointment.   PLAN FOR NEXT SESSION: will reassess 3-4 weeks post op to determine needs.   Patient will follow up at outpatient cancer rehab 3-4 weeks following surgery.  If the patient requires physical therapy at that time, a specific plan will be dictated and sent to the referring physician for approval. The patient was educated today on appropriate basic range of motion exercises to begin post operatively and the importance of attending the After Breast Cancer class following surgery.  Patient was educated today on lymphedema risk reduction practices as it pertains to recommendations that will benefit the patient immediately following surgery.  She verbalized good understanding.    Physical Therapy Information for After Breast Cancer Surgery/Treatment:  Lymphedema is a swelling condition that you may be at  risk for in your arm if you have lymph nodes removed from the armpit area.  After a sentinel node biopsy, the risk is approximately 5-9% and is higher after an axillary node dissection.  There is treatment available for this condition and it is not life-threatening.  Contact your physician or physical therapist with concerns. You may begin the 4 shoulder/posture exercises (see additional sheet) when permitted by your physician (typically a week after surgery).  If you have drains, you may need to wait until those are removed before beginning range of motion exercises.  A general recommendation is to not lift your arms above shoulder height until drains are removed.  These exercises should be done to your tolerance and gently.  This is not a "no pain/no gain" type of recovery so listen to your body and stretch into the range of motion that you can tolerate, stopping if you have pain.  If you are having immediate reconstruction, ask your plastic surgeon about doing exercises as he or she may want you to wait. We encourage you to attend the free one time ABC (After Breast Cancer) class offered by [facility].  You will learn information related to lymphedema risk, prevention and treatment and additional exercises to regain mobility following surgery.  You can call [phone] for more information.  This is offered the 1st and 3rd Monday of each month.  You only attend the class one time. While undergoing any medical procedure or treatment, try to avoid blood pressure being taken or needle sticks from occurring on the arm on the side of cancer.   This recommendation begins after surgery and continues for the rest of your life.  This may help reduce your risk of getting lymphedema (swelling in your arm). An excellent resource for those seeking information on lymphedema is the National Lymphedema Network's web site. It  can be accessed at [place].org If you notice swelling in your hand,  arm or breast at any time following surgery (even if it is many years from now), please contact your doctor or physical therapist to discuss this.  Lymphedema can be treated at any time but it is easier for you if it is treated early on.  If you feel like your shoulder motion is not returning to normal in a reasonable amount of time, please contact your surgeon or physical therapist.  [facility] [phone]. [address], [address], [place] [address]  ABC CLASS After Breast Cancer Class  After Breast Cancer Class is a specially designed exercise class to assist you in a safe recover after having breast cancer surgery.  In this class you will learn how to get back to full function whether your drains were just removed or if you had surgery a month ago.  This one-time class is held the 1st and 3rd Monday of every month from 11:00 a.m. until 12:00 noon virtually.  This class is FREE and space is limited. For more information or to register for the next available class, call [phone].  Class Goals  Understand specific stretches to improve the flexibility of you chest and shoulder. Learn ways to safely strengthen your upper body and improve your posture. Understand the warning signs of infection and why you may be at risk for an arm infection. Learn about Lymphedema and prevention.  ** You do not attend this class until after surgery.  Drains must be removed to participate  Patient was instructed today in a home exercise program today for post op shoulder range of motion. These included active assist shoulder flexion in sitting, scapular retraction, wall walking with shoulder abduction, and hands behind head external rotation.  She was encouraged to do these twice a day, holding 3 seconds and repeating 5 times when permitted by her physician.    Stark Bray, PT 06/02/2022, 12:19 PM

## 2022-06-02 ENCOUNTER — Ambulatory Visit
Admission: RE | Admit: 2022-06-02 | Discharge: 2022-06-02 | Disposition: A | Discharge status: Home / Self Care | Payer: Medicare Other | Source: Ambulatory Visit | Attending: Radiation Oncology | Admitting: Radiation Oncology

## 2022-06-02 ENCOUNTER — Encounter: Payer: Self-pay | Admitting: Rehabilitation

## 2022-06-02 ENCOUNTER — Other Ambulatory Visit: Payer: Self-pay

## 2022-06-02 ENCOUNTER — Ambulatory Visit: Payer: Medicare Other | Attending: Surgery | Admitting: Rehabilitation

## 2022-06-02 ENCOUNTER — Encounter: Payer: Self-pay | Admitting: Radiation Oncology

## 2022-06-02 VITALS — BP 151/78 | HR 82 | Temp 97.2°F | Resp 18 | Ht <= 58 in | Wt 151.0 lb

## 2022-06-02 DIAGNOSIS — R293 Abnormal posture: Secondary | ICD-10-CM | POA: Insufficient documentation

## 2022-06-02 DIAGNOSIS — N183 Chronic kidney disease, stage 3 unspecified: Secondary | ICD-10-CM | POA: Diagnosis not present

## 2022-06-02 DIAGNOSIS — Z79899 Other long term (current) drug therapy: Secondary | ICD-10-CM | POA: Diagnosis not present

## 2022-06-02 DIAGNOSIS — Z7982 Long term (current) use of aspirin: Secondary | ICD-10-CM | POA: Diagnosis not present

## 2022-06-02 DIAGNOSIS — Z170 Estrogen receptor positive status [ER+]: Secondary | ICD-10-CM

## 2022-06-02 DIAGNOSIS — E1122 Type 2 diabetes mellitus with diabetic chronic kidney disease: Secondary | ICD-10-CM | POA: Diagnosis not present

## 2022-06-02 DIAGNOSIS — Z7984 Long term (current) use of oral hypoglycemic drugs: Secondary | ICD-10-CM | POA: Diagnosis not present

## 2022-06-02 DIAGNOSIS — I129 Hypertensive chronic kidney disease with stage 1 through stage 4 chronic kidney disease, or unspecified chronic kidney disease: Secondary | ICD-10-CM | POA: Diagnosis not present

## 2022-06-02 DIAGNOSIS — C50512 Malignant neoplasm of lower-outer quadrant of left female breast: Secondary | ICD-10-CM | POA: Diagnosis not present

## 2022-06-02 DIAGNOSIS — E7800 Pure hypercholesterolemia, unspecified: Secondary | ICD-10-CM | POA: Insufficient documentation

## 2022-06-02 NOTE — Progress Notes (Signed)
Radiation Oncology         (336) [phone] ________________________________  Name: Nicole Nunez        MRN: [id number]  Date of Service: 06/02/2022 DOB: 1945-10-02  HL:[id number], Bailey Mech, MD  Erroll Luna, MD     REFERRING PHYSICIAN: Erroll Luna, MD   DIAGNOSIS: The encounter diagnosis was Malignant neoplasm of lower-outer quadrant of left breast of female, estrogen receptor positive ([place]).   HISTORY OF PRESENT ILLNESS: Nicole Nunez is a 77 y.o. female seen in the multidisciplinary breast clinic for a new diagnosis of left breast cancer. The patient was noted to have screening detected mass in the left breast at the 6 o'clock position.  By ultrasound it measured 9 mm in greatest dimension, and no evidence of adenopathy was appreciated.  She underwent a biopsy on 05/18/2022 that showed a grade 2 invasive ductal carcinoma that was ER/PR positive HER2 was negative with a Ki-67 of 50%.  She is planning to undergo a lumpectomy with sentinel lymph node biopsy on 06/21/2022 with Dr. Brantley Stage and sees Dr. Lindi Adie next week as well.  PREVIOUS RADIATION THERAPY: No   PAST MEDICAL HISTORY:  Past Medical History:  Diagnosis Date   Breast cancer ([place]) 05/18/2022   CKD stage 3 due to type 2 diabetes mellitus (HCC)    DM (diabetes mellitus) (HCC)    High cholesterol    HTN (hypertension)        PAST SURGICAL HISTORY: Past Surgical History:  Procedure Laterality Date   BREAST BIOPSY Left 05/18/2022   [place] LT BREAST BX W LOC DEV 1ST LESION IMG BX SPEC US GUIDE 05/18/2022 [facility]AMMOGRAP[facility]   CATARACT EXTRACTION, BILATERAL  01/2018   first was 12/2017   corn removal Bilateral 06/2017     FAMILY HISTORY:  Family History  Problem Relation Age of Onset   Asthma Mother    Hypertension Mother    Alzheimer's disease Mother    Early death Father      SOCIAL HISTORY:  reports that she has never smoked. She has never used smokeless tobacco. She reports that she does not drink alcohol and  does not use drugs.  The patient is widowed and lives in [place].    ALLERGIES: Statins   MEDICATIONS:  Current Outpatient Medications  Medication Sig Dispense Refill   ascorbic acid (VITAMIN C) 500 MG tablet Take 500 mg by mouth daily.     aspirin EC 81 MG tablet Take 81 mg by mouth daily.     b complex vitamins tablet Take 1 tablet by mouth daily.     Biotin 2500 MCG CAPS Take 5,000 mg by mouth daily.     Calcium Carbonate (CALCIUM 600 PO) Take 1 tablet by mouth daily.      Cholecalciferol (VITAMIN D3) 125 MCG (5000 UT) CAPS Take 1 capsule by mouth daily.      dapagliflozin propanediol (FARXIGA) 10 MG TABS tablet Take 1 tablet (10 mg total) by mouth daily. 90 tablet 2   Lancet Devices (ONE TOUCH DELICA LANCING DEV) MISC USE AS DIRECTED TO CHECK BLOOD SUGARS ONCE DAILY dx: e11.22 1 each 2   loratadine (CLARITIN) 10 MG tablet Take 1 tablet (10 mg total) by mouth daily. 30 tablet 2   magnesium gluconate (MAGONATE) 500 MG tablet Take 500 mg by mouth daily.     Multiple Vitamin (MULTIVITAMIN) tablet Take 1 tablet by mouth daily.     olmesartan (BENICAR) 20 MG tablet TAKE 1 TABLET BY MOUTH DAILY 100 tablet  2   Omega-3 Fatty Acids (FISH OIL) 1000 MG CAPS Take 1 capsule by mouth daily.     ONETOUCH VERIO test strip Use to check blood sugars daily. E11.01 100 strip 3   pantoprazole (PROTONIX) 40 MG tablet TAKE 1 TABLET BY MOUTH DAILY 100 tablet 2   Pitavastatin Calcium 4 MG TABS Take 1 tablet by mouth daily 90 tablet 3   Pitavastatin Magnesium 4 MG TABS Take 1 tablet by mouth daily. 30 tablet 11   Probiotic Product (PROBIOTIC PO) Take by mouth.     Semaglutide,0.25 or 0.'5MG'$ /DOS, (OZEMPIC, 0.25 OR 0.5 MG/DOSE,) 2 MG/3ML SOPN Inject 0.'5mg'$  SQ weekly. 3 mL 0   zinc gluconate 50 MG tablet Take 50 mg by mouth daily.     No current facility-administered medications for this encounter.     REVIEW OF SYSTEMS: On review of systems, the patient reports that she is doing well overall. No breast  specific complaints are verbalized.      PHYSICAL EXAM:  Wt Readings from Last 3 Encounters:  06/02/22 151 lb (68.5 kg)  05/17/22 150 lb 3.2 oz (68.1 kg)  05/10/22 150 lb (68 kg)   Temp Readings from Last 3 Encounters:  06/02/22 (!) 97.2 F (36.2 C) (Temporal)  02/17/22 98.2 F (36.8 C)  02/10/22 98.4 F (36.9 C) (Oral)   BP Readings from Last 3 Encounters:  06/02/22 (!) 151/78  04/22/22 128/64  04/18/22 124/70   Pulse Readings from Last 3 Encounters:  06/02/22 82  04/22/22 80  04/18/22 83    In general this is a well appearing African American female in no acute distress. She's alert and oriented x4 and appropriate throughout the examination. Cardiopulmonary assessment is negative for acute distress and she exhibits normal effort. Bilateral breast exam is deferred.    ECOG = 0  0 - Asymptomatic (Fully active, able to carry on all predisease activities without restriction)  1 - Symptomatic but completely ambulatory (Restricted in physically strenuous activity but ambulatory and able to carry out work of a light or sedentary nature. For example, light housework, office work)  2 - Symptomatic, <50% in bed during the day (Ambulatory and capable of all self care but unable to carry out any work activities. Up and about more than 50% of waking hours)  3 - Symptomatic, >50% in bed, but not bedbound (Capable of only limited self-care, confined to bed or chair 50% or more of waking hours)  4 - Bedbound (Completely disabled. Cannot carry on any self-care. Totally confined to bed or chair)  5 - Death   Eustace Pen MM, Creech RH, Tormey DC, et al. [phone]). "Toxicity and response criteria of the [facility]". [place] Oncol. 5 (6): 649-55    LABORATORY DATA:  Lab Results  Component Value Date   WBC 6.7 02/17/2022   HGB 12.5 02/17/2022   HCT 38.4 02/17/2022   MCV 84 02/17/2022   PLT 309 02/17/2022   Lab Results  Component Value Date   NA 144  02/17/2022   K 4.3 02/17/2022   CL 104 02/17/2022   CO2 23 02/17/2022   Lab Results  Component Value Date   ALT 21 02/17/2022   AST 28 02/17/2022   ALKPHOS 120 02/17/2022   BILITOT 0.6 02/17/2022      RADIOGRAPHY: [place] LT BREAST BX W LOC DEV 1ST LESION IMG BX SPEC US GUIDE  Addendum Date: 05/23/2022   ADDENDUM REPORT: 05/23/2022 13:24 ADDENDUM: Pathology revealed GRADE II INVASIVE DUCTAL  CARCINOMA of the LEFT breast, 6:00 o'clock, 3 cmfn, (ribbon clip). This was found to be concordant by Dr. Dorise Bullion. Pathology results were discussed with the patient by telephone. The patient reported doing well after the biopsy with minimal tenderness at the site. Post biopsy instructions and care were reviewed and questions were answered. The patient was encouraged to call [facility] for any additional concerns. My direct phone number was provided. Surgical consultation has been arranged with Dr. Erroll Luna at [facility] on May 27, 2022. Pathology results reported by Terie Purser, RN on 05/20/2022. Electronically Signed   By: Dorise Bullion III M.D.   On: 05/23/2022 13:24   Result Date: 05/23/2022 CLINICAL DATA:  Biopsy of a 6 o'clock left breast mass EXAM: ULTRASOUND GUIDED LEFT BREAST CORE NEEDLE BIOPSY COMPARISON:  Previous exam(s). PROCEDURE: I met with the patient and we discussed the procedure of ultrasound-guided biopsy, including benefits and alternatives. We discussed the high likelihood of a successful procedure. We discussed the risks of the procedure, including infection, bleeding, tissue injury, clip migration, and inadequate sampling. Informed written consent was given. The usual time-out protocol was performed immediately prior to the procedure. Lesion quadrant: 6 o'clock left breast Using sterile technique and 1% Lidocaine as local anesthetic, under direct ultrasound visualization, a 12 gauge spring-loaded device was used to perform biopsy of a  6 o'clock left breast mass using a lateral approach. At the conclusion of the procedure a ribbon shaped tissue marker clip was deployed into the biopsy cavity. Follow up 2 view mammogram was performed and dictated separately. IMPRESSION: Ultrasound guided biopsy of a 6 o'clock left breast mass. No apparent complications. Electronically Signed: By: Dorise Bullion III M.D. On: 05/18/2022 14:27  MM CLIP PLACEMENT LEFT  Result Date: 05/18/2022 CLINICAL DATA:  Evaluate biopsy marker EXAM: 3D DIAGNOSTIC LEFT MAMMOGRAM POST ULTRASOUND BIOPSY COMPARISON:  Previous exam(s). FINDINGS: 3D Mammographic images were obtained following ultrasound guided biopsy of a left breast mass. The ribbon shaped clip is within the biopsied 6 o'clock left breast mass. IMPRESSION: Appropriate positioning of the ribbon shaped biopsy marking clip at the site of biopsy in the biopsied 6 o'clock left breast mass. Final Assessment: Post Procedure Mammograms for Marker Placement Electronically Signed   By: Dorise Bullion III M.D.   On: 05/18/2022 14:49      IMPRESSION/PLAN: 1. Stage IA, cT1bN0M0, grade 2, ER/PR positive invasive ductal carcinoma of the left breast. Dr. Lisbeth Renshaw discusses the pathology findings and reviews the nature of early stage breast disease. The consensus from the breast conference includes breast conservation with lumpectomy with   sentinel node biopsy. Depending on the size of the final tumor measurements rendered by pathology, the tumor may be tested for Oncotype Dx score to determine a role for systemic therapy. Provided that chemotherapy is not indicated, the patient's course would then be followed by external radiotherapy to the breast  to reduce risks of local recurrence followed by antiestrogen therapy. We discussed the risks, benefits, short, and long term effects of radiotherapy, as well as the curative intent, and the patient is interested in proceeding. Dr. Lisbeth Renshaw discusses the delivery and logistics of  radiotherapy and anticipates a course of 4 or up to 6 1/2 weeks of radiotherapy to the left breast with deep inspiration breath hold technique. We will see her back a few weeks after surgery to discuss the simulation process and anticipate we starting radiotherapy about 4-6 weeks after surgery.  In a visit lasting 60 minutes, greater than 50% of the time was spent face to face reviewing her case, as well as in preparation of, discussing, and coordinating the patient's care.  The above documentation reflects my direct findings during this shared patient visit. Please see the separate note by Dr. Lisbeth Renshaw on this date for the remainder of the patient's plan of care.    Carola Rhine, [facility]    **Disclaimer: This note was dictated with voice recognition software. Similar sounding words can inadvertently be transcribed and this note may contain transcription errors which may not have been corrected upon publication of note.**

## 2022-06-06 ENCOUNTER — Inpatient Hospital Stay: Payer: Medicare Other | Attending: Hematology and Oncology | Admitting: Hematology and Oncology

## 2022-06-06 ENCOUNTER — Inpatient Hospital Stay: Payer: Medicare Other

## 2022-06-06 VITALS — BP 144/76 | HR 93 | Temp 97.5°F | Resp 18 | Ht <= 58 in | Wt 150.5 lb

## 2022-06-06 DIAGNOSIS — Z818 Family history of other mental and behavioral disorders: Secondary | ICD-10-CM | POA: Insufficient documentation

## 2022-06-06 DIAGNOSIS — Z825 Family history of asthma and other chronic lower respiratory diseases: Secondary | ICD-10-CM | POA: Insufficient documentation

## 2022-06-06 DIAGNOSIS — C50512 Malignant neoplasm of lower-outer quadrant of left female breast: Secondary | ICD-10-CM

## 2022-06-06 DIAGNOSIS — N1831 Chronic kidney disease, stage 3a: Secondary | ICD-10-CM | POA: Diagnosis not present

## 2022-06-06 DIAGNOSIS — Z888 Allergy status to other drugs, medicaments and biological substances status: Secondary | ICD-10-CM | POA: Insufficient documentation

## 2022-06-06 DIAGNOSIS — Z79899 Other long term (current) drug therapy: Secondary | ICD-10-CM | POA: Diagnosis not present

## 2022-06-06 DIAGNOSIS — E7800 Pure hypercholesterolemia, unspecified: Secondary | ICD-10-CM | POA: Diagnosis not present

## 2022-06-06 DIAGNOSIS — Z8249 Family history of ischemic heart disease and other diseases of the circulatory system: Secondary | ICD-10-CM | POA: Diagnosis not present

## 2022-06-06 DIAGNOSIS — E1122 Type 2 diabetes mellitus with diabetic chronic kidney disease: Secondary | ICD-10-CM | POA: Diagnosis not present

## 2022-06-06 DIAGNOSIS — Z170 Estrogen receptor positive status [ER+]: Secondary | ICD-10-CM

## 2022-06-06 NOTE — Assessment & Plan Note (Signed)
05/18/2022: Screening mammogram detected left breast mass by ultrasound it measured 9 mm, biopsy revealed grade 2 IDC ER 90%, PR 15%, Ki-67 50%, HER2 2+ by IHC, FISH negative ratio 2.17, Copy #3.9, additional 20 cells were examined ratio came at 2.19 and copy #3.72  Pathology and radiology counseling:Discussed with the patient, the details of pathology including the type of breast cancer,the clinical staging, the significance of ER, PR and HER-2/neu receptors and the implications for treatment. After reviewing the pathology in detail, we proceeded to discuss the different treatment options between surgery, radiation, chemotherapy, antiestrogen therapies.  Recommendations: 1. Breast conserving surgery followed by 2. Oncotype DX testing to determine if chemotherapy would be of any benefit followed by 3. Adjuvant radiation therapy followed by 4. Adjuvant antiestrogen therapy  Oncotype counseling: I discussed Oncotype DX test. I explained to the patient that this is a 21 gene panel to evaluate patient tumors DNA to calculate recurrence score. This would help determine whether patient has high risk or low risk breast cancer. She understands that if her tumor was found to be high risk, she would benefit from systemic chemotherapy. If low risk, no need of chemotherapy. I am concerned about the low intensity staining of the ER and PR as well as high Ki-67.  Return to clinic after surgery to discuss final pathology report and then determine if Oncotype DX testing will need to be sent.

## 2022-06-06 NOTE — Progress Notes (Signed)
[place] NOTE  Patient Care Team: Glendale Chard, MD as PCP - General (Internal Medicine) Mayford Knife, [facility] (Pharmacist)  CHIEF COMPLAINTS/PURPOSE OF CONSULTATION:  Newly diagnosed breast cancer  HISTORY OF PRESENTING ILLNESS:  Nicole Nunez 77 y.o. female is here because of recent diagnosis of left breast cancer.  Patient was screening mammogram that detected left breast mass which measured 0.9 cm by ultrasound.  Biopsy revealed grade 2 IDC that was ER/PR positive HER2 negative with a Ki-67 of 50%.  She was seen by surgery and was referred to [place] for discussion regarding the treatment plan.  I reviewed her records extensively and collaborated the history with the patient.  SUMMARY OF ONCOLOGIC HISTORY: Oncology History  Malignant neoplasm of lower-outer quadrant of left breast of female, estrogen receptor positive ([place])  05/18/2022 Initial Diagnosis   05/18/2022: Screening mammogram detected left breast mass by ultrasound it measured 9 mm, biopsy revealed grade 2 IDC ER 90%, PR 15%, Ki-67 50%, HER2 2+ by IHC, FISH negative ratio 2.17, Copy #3.9, additional 20 cells were examined ratio came at 2.19 and copy #3.72   06/05/2022 Cancer Staging   Staging form: Breast, AJCC 8th Edition - Clinical: Stage IA (cT1b, cN0, cM0, G2, ER+, PR+, HER2-) - Signed by Hayden Pedro, PA-C on 06/05/2022 Method of lymph node assessment: Clinical Histologic grading system: 3 grade system      MEDICAL HISTORY:  Past Medical History:  Diagnosis Date   Breast cancer ([place]) 05/18/2022   CKD stage 3 due to type 2 diabetes mellitus ([place])    DM (diabetes mellitus) ([place])    High cholesterol    HTN (hypertension)     SURGICAL HISTORY: Past Surgical History:  Procedure Laterality Date   BREAST BIOPSY Left 05/18/2022   [place] LT BREAST BX W LOC DEV 1ST LESION IMG BX SPEC US GUIDE 05/18/2022 [facility]AMMO[facility]   CATARACT EXTRACTION, BILATERAL  01/2018   first was 12/2017    corn removal Bilateral 06/2017    SOCIAL HISTORY: Social History   Socioeconomic History   Marital status: Widowed    Spouse name: Not on file   Number of children: Not on file   Years of education: Not on file   Highest education level: Not on file  Occupational History   Not on file  Tobacco Use   Smoking status: Never   Smokeless tobacco: Never  Vaping Use   Vaping Use: Never used  Substance and Sexual Activity   Alcohol use: Never   Drug use: Never   Sexual activity: Yes  Other Topics Concern   Not on file  Social History Narrative   Not on file   Social Determinants of Health   Financial Resource Strain: Low Risk  (02/10/2022)   Overall Financial Resource Strain (CARDIA)    Difficulty of Paying Living Expenses: Not hard at all  Food Insecurity: No Food Insecurity (02/10/2022)   Hunger Vital Sign    Worried About Running Out of Food in the Last Year: Never true    [place] in the Last Year: Never true  Transportation Needs: No Transportation Needs (02/10/2022)   PRAPARE - Hydrologist (Medical): No    Lack of Transportation (Non-Medical): No  Physical Activity: Inactive (02/10/2022)   Exercise Vital Sign    Days of Exercise per Week: 0 days    Minutes of Exercise per Session: 0 min  Stress: No Stress Concern Present (02/10/2022)  Oracle Questionnaire    Feeling of Stress : Not at all  Social Connections: Unknown (05/05/2020)   Social Connection and Isolation Panel [NHANES]    Frequency of Communication with Friends and Family: More than three times a week    Frequency of Social Gatherings with Friends and Family: More than three times a week    Attends Religious Services: More than 4 times per year    Active Member of [employer] or Organizations: Yes    Attends Music therapist: More than 4 times per year    Marital Status: Not on file  Intimate Partner  Violence: Not At Risk (12/20/2018)   Humiliation, Afraid, Rape, and Kick questionnaire    Fear of Current or Ex-Partner: No    Emotionally Abused: No    Physically Abused: No    Sexually Abused: No    FAMILY HISTORY: Family History  Problem Relation Age of Onset   Asthma Mother    Hypertension Mother    Alzheimer's disease Mother    Early death Father     ALLERGIES:  is allergic to statins.  MEDICATIONS:  Current Outpatient Medications  Medication Sig Dispense Refill   ascorbic acid (VITAMIN C) 500 MG tablet Take 500 mg by mouth daily.     aspirin EC 81 MG tablet Take 81 mg by mouth daily.     b complex vitamins tablet Take 1 tablet by mouth daily.     Biotin 2500 MCG CAPS Take 5,000 mg by mouth daily.     Calcium Carbonate (CALCIUM 600 PO) Take 1 tablet by mouth daily.      Cholecalciferol (VITAMIN D3) 125 MCG (5000 UT) CAPS Take 1 capsule by mouth daily.      dapagliflozin propanediol (FARXIGA) 10 MG TABS tablet Take 1 tablet (10 mg total) by mouth daily. 90 tablet 2   Lancet Devices (ONE TOUCH DELICA LANCING DEV) MISC USE AS DIRECTED TO CHECK BLOOD SUGARS ONCE DAILY dx: e11.22 1 each 2   loratadine (CLARITIN) 10 MG tablet Take 1 tablet (10 mg total) by mouth daily. 30 tablet 2   magnesium gluconate (MAGONATE) 500 MG tablet Take 500 mg by mouth daily.     Multiple Vitamin (MULTIVITAMIN) tablet Take 1 tablet by mouth daily.     olmesartan (BENICAR) 20 MG tablet TAKE 1 TABLET BY MOUTH DAILY 100 tablet 2   Omega-3 Fatty Acids (FISH OIL) 1000 MG CAPS Take 1 capsule by mouth daily.     ONETOUCH VERIO test strip Use to check blood sugars daily. E11.01 100 strip 3   pantoprazole (PROTONIX) 40 MG tablet TAKE 1 TABLET BY MOUTH DAILY 100 tablet 2   Pitavastatin Calcium 4 MG TABS Take 1 tablet by mouth daily 90 tablet 3   Pitavastatin Magnesium 4 MG TABS Take 1 tablet by mouth daily. 30 tablet 11   Probiotic Product (PROBIOTIC PO) Take by mouth.     Semaglutide,0.25 or 0.'5MG'$ /DOS,  (OZEMPIC, 0.25 OR 0.5 MG/DOSE,) 2 MG/3ML SOPN Inject 0.'5mg'$  SQ weekly. 3 mL 0   zinc gluconate 50 MG tablet Take 50 mg by mouth daily.     No current facility-administered medications for this visit.    REVIEW OF SYSTEMS:   Constitutional: Denies fevers, chills or abnormal night sweats Breast:  Denies any palpable lumps or discharge All other systems were reviewed with the patient and are negative.  PHYSICAL EXAMINATION: ECOG PERFORMANCE STATUS: 1 - Symptomatic but completely ambulatory  Vitals:  06/06/22 0836  BP: (!) 144/76  Pulse: 93  Resp: 18  Temp: (!) 97.5 F (36.4 C)  SpO2: 100%   Filed Weights   06/06/22 0836  Weight: 150 lb 8 oz (68.3 kg)    GENERAL:alert, no distress and comfortable  LABORATORY DATA:  I have reviewed the data as listed Lab Results  Component Value Date   WBC 6.7 02/17/2022   HGB 12.5 02/17/2022   HCT 38.4 02/17/2022   MCV 84 02/17/2022   PLT 309 02/17/2022   Lab Results  Component Value Date   NA 144 02/17/2022   K 4.3 02/17/2022   CL 104 02/17/2022   CO2 23 02/17/2022    RADIOGRAPHIC STUDIES: I have personally reviewed the radiological reports and agreed with the findings in the report.  ASSESSMENT AND PLAN:  Malignant neoplasm of lower-outer quadrant of left breast of female, estrogen receptor positive ([place]) 05/18/2022: Screening mammogram detected left breast mass by ultrasound it measured 9 mm, biopsy revealed grade 2 IDC ER 90%, PR 15%, Ki-67 50%, HER2 2+ by IHC, FISH negative ratio 2.17, Copy #3.9, additional 20 cells were examined ratio came at 2.19 and copy #3.72  Pathology and radiology counseling:Discussed with the patient, the details of pathology including the type of breast cancer,the clinical staging, the significance of ER, PR and HER-2/neu receptors and the implications for treatment. After reviewing the pathology in detail, we proceeded to discuss the different treatment options between surgery, radiation,  chemotherapy, antiestrogen therapies.  Recommendations: 1. Breast conserving surgery followed by 2. Oncotype DX testing to determine if chemotherapy would be of any benefit followed by 3. Adjuvant radiation therapy followed by 4. Adjuvant antiestrogen therapy  Other than diabetes she appears to be in excellent physical health.  Oncotype counseling: I discussed Oncotype DX test. I explained to the patient that this is a 21 gene panel to evaluate patient tumors DNA to calculate recurrence score. This would help determine whether patient has high risk or low risk breast cancer. She understands that if her tumor was found to be high risk, she would benefit from systemic chemotherapy. If low risk, no need of chemotherapy. I am concerned about the low intensity staining of the ER and PR as well as high Ki-67.  Return to clinic after surgery to discuss final pathology report and then determine if Oncotype DX testing will need to be sent.   All questions were answered. The patient knows to call the clinic with any problems, questions or concerns.    [name], MD 06/06/22

## 2022-06-07 ENCOUNTER — Telehealth: Payer: Self-pay | Admitting: Emergency Medicine

## 2022-06-07 NOTE — Progress Notes (Signed)
[facility] PREP Weekly Session  Patient Details  Name: Nicole Nunez MRN: [id number] Date of Birth: [birth year] Age: 77 y.o. PCP: Glendale Chard, MD  Vitals:   06/07/22 1430  Weight: 150 lb (68 kg)     [facility] Weekly seesion - 06/07/22 1700       [facility] "PREP" Location   [facility]PREP" [facility]      Weekly Session   Topic Discussed Stress management and problem solving    Minutes exercised this week 120 minutes    Classes attended to date [place] 06/07/2022, 5:09 PM

## 2022-06-07 NOTE — Telephone Encounter (Signed)
[employer] 2021-05 - Specimen Collection Study to Evaluate Biomarkers in Subjects with Cancer   Called to introduce study to patient.  Patient states they are not interested in hearing about the research study.  Patient denies any questions or concerns at this time.  Wells Guiles 'Learta Codding' Neysa Bonito, RN, BSN, [facility] Clinical Research Nurse I 06/07/22 11:10 AM

## 2022-06-08 ENCOUNTER — Telehealth: Payer: Self-pay | Admitting: Hematology and Oncology

## 2022-06-08 NOTE — Telephone Encounter (Signed)
Scheduled appointment per 1/15 los. Patient is aware.

## 2022-06-09 ENCOUNTER — Encounter: Payer: Self-pay | Admitting: *Deleted

## 2022-06-09 ENCOUNTER — Telehealth: Payer: Self-pay | Admitting: *Deleted

## 2022-06-09 NOTE — Telephone Encounter (Signed)
Spoke with patient to follow up from new patient appt and assess navigation needs.  Patient denies any questions or concerns at this time. Contact information given and encouraged her to call should anything arise.  Patient verbalized understanding.

## 2022-06-14 ENCOUNTER — Other Ambulatory Visit: Payer: Self-pay

## 2022-06-14 ENCOUNTER — Encounter (HOSPITAL_BASED_OUTPATIENT_CLINIC_OR_DEPARTMENT_OTHER): Payer: Self-pay | Admitting: Surgery

## 2022-06-15 ENCOUNTER — Encounter (HOSPITAL_BASED_OUTPATIENT_CLINIC_OR_DEPARTMENT_OTHER)
Admission: RE | Admit: 2022-06-15 | Discharge: 2022-06-15 | Disposition: A | Discharge status: Home / Self Care | Payer: Medicare Other | Source: Ambulatory Visit | Attending: Surgery | Admitting: Surgery

## 2022-06-15 DIAGNOSIS — N1831 Chronic kidney disease, stage 3a: Secondary | ICD-10-CM | POA: Insufficient documentation

## 2022-06-15 DIAGNOSIS — Z01812 Encounter for preprocedural laboratory examination: Secondary | ICD-10-CM | POA: Diagnosis not present

## 2022-06-15 DIAGNOSIS — E1122 Type 2 diabetes mellitus with diabetic chronic kidney disease: Secondary | ICD-10-CM | POA: Insufficient documentation

## 2022-06-15 LAB — BASIC METABOLIC PANEL
Anion gap: 8 (ref 5–15)
BUN: 17 mg/dL (ref 8–23)
CO2: 27 mmol/L (ref 22–32)
Calcium: 8.9 mg/dL (ref 8.9–10.3)
Chloride: 102 mmol/L (ref 98–111)
Creatinine, Ser: 1.13 mg/dL — ABNORMAL HIGH (ref 0.44–1.00)
GFR, Estimated: 51 mL/min — ABNORMAL LOW (ref >60)
Glucose, Bld: 118 mg/dL — ABNORMAL HIGH (ref 70–99)
Potassium: 4.3 mmol/L (ref 3.5–5.1)
Sodium: 137 mmol/L (ref 135–145)

## 2022-06-15 MED ORDER — CHLORHEXIDINE GLUCONATE CLOTH 2 % EX PADS: 6.0000 | MEDICATED_PAD | Freq: Once | CUTANEOUS | Status: DC

## 2022-06-15 NOTE — Progress Notes (Signed)
[facility] PREP Weekly Session  Patient Details  Name: Nicole Nunez MRN: [id number] Date of Birth: 1946-05-01 Age: 77 y.o. PCP: Glendale Chard, MD  Vitals:   06/14/22 1430  Weight: 151 lb (68.5 kg)     [facility] Weekly seesion - 06/15/22 1400       [facility] "PREP" Location   [facility]      Weekly Session   Topic Discussed Finding support    Minutes exercised this week 200 minutes    Classes attended to date 12             Barnett Hatter 06/15/2022, 2:22 PM

## 2022-06-15 NOTE — Progress Notes (Signed)
      Enhanced Recovery after Surgery Enhanced Recovery after Surgery is a protocol used to improve the stress on your body and your recovery after surgery.  Patient Instructions  The night before surgery:  No food after midnight. ONLY clear liquids after midnight  The day of surgery (if you do NOT have diabetes):  Drink ONE (1) Pre-Surgery Clear Ensure as directed.   This drink was given to you during your hospital  pre-op appointment visit. The pre-op nurse will instruct you on the time to drink the  Pre-Surgery Ensure depending on your surgery time. Finish the drink at the designated time by the pre-op nurse.  Nothing else to drink after completing the  Pre-Surgery Clear Ensure.  The day of surgery (if you have diabetes): Drink ONE (1) Gatorade 2 (G2) as directed. This drink was given to you during your hospital  pre-op appointment visit.  The pre-op nurse will instruct you on the time to drink the   Gatorade 2 (G2) depending on your surgery time. Color of the Gatorade may vary. Red is not allowed. Nothing else to drink after completing the  Gatorade 2 (G2).         If you have questions, please contact your surgeon's office.  Surgical soap given with written instructions on how and when to use it.  Pt verbalized understandings.

## 2022-06-16 ENCOUNTER — Telehealth: Payer: Self-pay | Admitting: Licensed Clinical Social Worker

## 2022-06-16 NOTE — Telephone Encounter (Signed)
[place] [facility]  Clinical Social Work was referred by new patient protocol for assessment of psychosocial needs.  Clinical Social Worker attempted to contact patient by phone  to offer support and assess for needs.   No answer. Left detailed VM with direct contact information.     [place], [place] Worker [employer]

## 2022-06-20 ENCOUNTER — Ambulatory Visit
Admission: RE | Admit: 2022-06-20 | Discharge: 2022-06-20 | Disposition: A | Discharge status: Home / Self Care | Payer: Medicare Other | Source: Ambulatory Visit | Attending: Surgery | Admitting: Surgery

## 2022-06-20 ENCOUNTER — Encounter: Payer: Self-pay | Admitting: Internal Medicine

## 2022-06-20 ENCOUNTER — Ambulatory Visit (INDEPENDENT_AMBULATORY_CARE_PROVIDER_SITE_OTHER): Payer: Medicare Other | Admitting: Internal Medicine

## 2022-06-20 ENCOUNTER — Other Ambulatory Visit: Payer: Self-pay | Admitting: Surgery

## 2022-06-20 VITALS — BP 120/64 | HR 64 | Temp 98.0°F | Ht <= 58 in | Wt 152.4 lb

## 2022-06-20 DIAGNOSIS — E6609 Other obesity due to excess calories: Secondary | ICD-10-CM

## 2022-06-20 DIAGNOSIS — C50512 Malignant neoplasm of lower-outer quadrant of left female breast: Secondary | ICD-10-CM | POA: Diagnosis not present

## 2022-06-20 DIAGNOSIS — E7800 Pure hypercholesterolemia, unspecified: Secondary | ICD-10-CM | POA: Diagnosis not present

## 2022-06-20 DIAGNOSIS — C50912 Malignant neoplasm of unspecified site of left female breast: Secondary | ICD-10-CM

## 2022-06-20 DIAGNOSIS — C50812 Malignant neoplasm of overlapping sites of left female breast: Secondary | ICD-10-CM | POA: Diagnosis not present

## 2022-06-20 DIAGNOSIS — Z79899 Other long term (current) drug therapy: Secondary | ICD-10-CM | POA: Diagnosis not present

## 2022-06-20 DIAGNOSIS — I129 Hypertensive chronic kidney disease with stage 1 through stage 4 chronic kidney disease, or unspecified chronic kidney disease: Secondary | ICD-10-CM

## 2022-06-20 DIAGNOSIS — N1831 Chronic kidney disease, stage 3a: Secondary | ICD-10-CM | POA: Diagnosis not present

## 2022-06-20 DIAGNOSIS — E1122 Type 2 diabetes mellitus with diabetic chronic kidney disease: Secondary | ICD-10-CM

## 2022-06-20 DIAGNOSIS — Z170 Estrogen receptor positive status [ER+]: Secondary | ICD-10-CM | POA: Diagnosis not present

## 2022-06-20 DIAGNOSIS — Z6831 Body mass index (BMI) 31.0-31.9, adult: Secondary | ICD-10-CM

## 2022-06-20 HISTORY — PX: BREAST BIOPSY: SHX20

## 2022-06-20 MED ORDER — OLMESARTAN MEDOXOMIL 20 MG PO TABS: 20.0000 mg | ORAL_TABLET | Freq: Every day | ORAL | 2 refills | Status: DC

## 2022-06-20 NOTE — Progress Notes (Signed)
Rich Brave [place],acting as a Education administrator for Maximino Greenland, MD.,have documented all relevant documentation on the behalf of Maximino Greenland, MD,as directed by  Maximino Greenland, MD while in the presence of Maximino Greenland, MD.    Subjective:     Patient ID: Nicole Nunez , female    DOB: August 12, 1945 , 77 y.o.   MRN: [id number]   Chief Complaint  Patient presents with   Diabetes   Hypertension    HPI  She is here for a diabetes/htn check. She reports compliance with meds. She denies headaches, chest pain and shortness of breath. She states her sugars range from 117-130. Average BS 120s.   Diabetes She presents for her follow-up diabetic visit. She has type 2 diabetes mellitus. Her disease course has been stable. There are no hypoglycemic associated symptoms. Pertinent negatives for diabetes include no blurred vision, no polydipsia, no polyphagia and no polyuria. There are no hypoglycemic complications. Current diabetic treatment includes oral agent (dual therapy). She is compliant with treatment most of the time. She is following a diabetic diet. She participates in exercise intermittently. An ACE inhibitor/angiotensin II receptor blocker is being taken. Eye exam is current.  Hypertension This is a chronic problem. The current episode started more than 1 year ago. The problem has been gradually improving since onset. The problem is controlled. Pertinent negatives include no blurred vision or palpitations. Hypertensive end-organ damage includes kidney disease.     Past Medical History:  Diagnosis Date   Breast cancer ([place]) 05/18/2022   CKD stage 3 due to type 2 diabetes mellitus ([facility])    DM (diabetes mellitus) ([place])    High cholesterol    HTN (hypertension)      Family History  Problem Relation Age of Onset   Asthma Mother    Hypertension Mother    Alzheimer's disease Mother    Early death Father      Current Outpatient Medications:    ascorbic acid (VITAMIN C) 500 MG  tablet, Take 500 mg by mouth daily., Disp: , Rfl:    aspirin EC 81 MG tablet, Take 81 mg by mouth daily., Disp: , Rfl:    b complex vitamins tablet, Take 1 tablet by mouth daily., Disp: , Rfl:    Biotin 2500 MCG CAPS, Take 5,000 mg by mouth daily., Disp: , Rfl:    Calcium Carbonate (CALCIUM 600 PO), Take 1 tablet by mouth daily. , Disp: , Rfl:    Cholecalciferol (VITAMIN D3) 125 MCG (5000 UT) CAPS, Take 1 capsule by mouth daily. , Disp: , Rfl:    dapagliflozin propanediol (FARXIGA) 10 MG TABS tablet, Take 1 tablet (10 mg total) by mouth daily., Disp: 90 tablet, Rfl: 2   Lancet Devices (ONE TOUCH DELICA LANCING DEV) MISC, USE AS DIRECTED TO CHECK BLOOD SUGARS ONCE DAILY dx: e11.22, Disp: 1 each, Rfl: 2   loratadine (CLARITIN) 10 MG tablet, Take 1 tablet (10 mg total) by mouth daily., Disp: 30 tablet, Rfl: 2   magnesium gluconate (MAGONATE) 500 MG tablet, Take 500 mg by mouth daily., Disp: , Rfl:    Multiple Vitamin (MULTIVITAMIN) tablet, Take 1 tablet by mouth daily., Disp: , Rfl:    Omega-3 Fatty Acids (FISH OIL) 1000 MG CAPS, Take 1 capsule by mouth daily., Disp: , Rfl:    ONETOUCH VERIO test strip, Use to check blood sugars daily. E11.01, Disp: 100 strip, Rfl: 3   pantoprazole (PROTONIX) 40 MG tablet, TAKE 1 TABLET BY MOUTH DAILY, Disp: 100  tablet, Rfl: 2   Pitavastatin Magnesium 4 MG TABS, Take 1 tablet by mouth daily., Disp: 30 tablet, Rfl: 11   Probiotic Product (PROBIOTIC PO), Take by mouth., Disp: , Rfl:    Semaglutide,0.25 or 0.'5MG'$ /DOS, (OZEMPIC, 0.25 OR 0.5 MG/DOSE,) 2 MG/3ML SOPN, Inject 0.'5mg'$  SQ weekly., Disp: 3 mL, Rfl: 0   zinc gluconate 50 MG tablet, Take 50 mg by mouth daily., Disp: , Rfl:    olmesartan (BENICAR) 20 MG tablet, Take 1 tablet (20 mg total) by mouth daily., Disp: 100 tablet, Rfl: 2 No current facility-administered medications for this visit.  Facility-Administered Medications Ordered in Other Visits:    6 CHG cloth bath night before surgery, , , Once **AND** 6 CHG  cloth bath AM of surgery, , , Once **AND** Chlorhexidine Gluconate Cloth 2 % PADS 6 each, 6 each, Topical, Once **AND** Chlorhexidine Gluconate Cloth 2 % PADS 6 each, 6 each, Topical, Once, Cornett, Thomas, MD   Allergies  Allergen Reactions   Statins Other (See Comments)    Rosuvastatin, atorvastatin, pravastatin, simvastatin     Review of Systems  Constitutional: Negative.   Eyes: Negative.  Negative for blurred vision.  Respiratory: Negative.    Cardiovascular: Negative.  Negative for palpitations.  Gastrointestinal: Negative.   Endocrine: Negative for polydipsia, polyphagia and polyuria.  Musculoskeletal: Negative.   Skin: Negative.   Neurological: Negative.   Psychiatric/Behavioral: Negative.       Today's Vitals   06/20/22 0823  BP: 120/64  Pulse: 64  Temp: 98 F (36.7 C)  Weight: 152 lb 6.4 oz (69.1 kg)  Height: '4\' 10"'$  (1.473 m)  PainSc: 0-No pain   Body mass index is 31.85 kg/m.  Wt Readings from Last 3 Encounters:  06/20/22 152 lb 6.4 oz (69.1 kg)  06/14/22 151 lb (68.5 kg)  06/07/22 150 lb (68 kg)     Objective:  Physical Exam Vitals and nursing note reviewed.  Constitutional:      Appearance: Normal appearance.  HENT:     Head: Normocephalic and atraumatic.     Nose:     Comments: Masked     Mouth/Throat:     Comments: Masked  Eyes:     Extraocular Movements: Extraocular movements intact.  Cardiovascular:     Rate and Rhythm: Normal rate and regular rhythm.     Heart sounds: Normal heart sounds.  Pulmonary:     Effort: Pulmonary effort is normal.     Breath sounds: Normal breath sounds.  Musculoskeletal:     Cervical back: Normal range of motion.  Skin:    General: Skin is warm.  Neurological:     General: No focal deficit present.     Mental Status: She is alert.  Psychiatric:        Mood and Affect: Mood normal.        Behavior: Behavior normal.         Assessment And Plan:     1. Type 2 diabetes mellitus with stage 3a chronic  kidney disease, without long-term current use of insulin ([place]) Comments: Chronic,I will check labs as below. She was congratulated for participating w/ the PREP program. She will rto in 4 months for re-evaluation. - Hemoglobin A1c  2. 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 She will c/w olmesartan daily. Encouraged to follow low sodium diet.  3. Pure hypercholesterolemia Comments: She is currently taking generic Livalo daily. She is now taking M-F, daily dosing  caused myalgias.  4. Malignant neoplasm of lower-outer quadrant of left breast of female, estrogen receptor positive ([place]) Comments: She is scheduled for lumpectomy tomorrow, plans to move forward with XRT. Jan 2024 Oncology note reviewed.  5. Class 1 obesity due to excess calories with serious comorbidity and body mass index (BMI) of 31.0 to 31.9 in adult Comments: She is encouraged to aim for at least 150 minutes of exercise/week, while striving for BMI<29 to decrease cardiac risk.  6. Drug therapy - Vitamin B12   Patient was given opportunity to ask questions. Patient verbalized understanding of the plan and was able to repeat key elements of the plan. All questions were answered to their satisfaction.   I, Maximino Greenland, MD, have reviewed all documentation for this visit. The documentation on 06/20/22 for the exam, diagnosis, procedures, and orders are all accurate and complete.   IF YOU HAVE BEEN REFERRED TO A SPECIALIST, IT MAY TAKE 1-2 WEEKS TO SCHEDULE/PROCESS THE REFERRAL. IF YOU HAVE NOT HEARD FROM US/SPECIALIST IN TWO WEEKS, PLEASE GIVE [place] A CALL AT [phone] X [phone].   THE PATIENT IS ENCOURAGED TO PRACTICE SOCIAL DISTANCING DUE TO THE COVID-19 PANDEMIC.

## 2022-06-20 NOTE — Patient Instructions (Signed)

## 2022-06-21 ENCOUNTER — Ambulatory Visit (HOSPITAL_BASED_OUTPATIENT_CLINIC_OR_DEPARTMENT_OTHER): Payer: Medicare Other | Admitting: Certified Registered"

## 2022-06-21 ENCOUNTER — Ambulatory Visit (HOSPITAL_BASED_OUTPATIENT_CLINIC_OR_DEPARTMENT_OTHER)
Admission: RE | Admit: 2022-06-21 | Discharge: 2022-06-21 | Disposition: A | Discharge status: Home / Self Care | Payer: Medicare Other | Attending: Surgery | Admitting: Surgery

## 2022-06-21 ENCOUNTER — Encounter (HOSPITAL_BASED_OUTPATIENT_CLINIC_OR_DEPARTMENT_OTHER)
Admission: RE | Disposition: A | Discharge status: Home / Self Care | Payer: Self-pay | Source: Home / Self Care | Attending: Surgery

## 2022-06-21 ENCOUNTER — Ambulatory Visit
Admission: RE | Admit: 2022-06-21 | Discharge: 2022-06-21 | Disposition: A | Discharge status: Home / Self Care | Payer: Medicare Other | Source: Ambulatory Visit | Attending: Surgery | Admitting: Surgery

## 2022-06-21 ENCOUNTER — Encounter (HOSPITAL_BASED_OUTPATIENT_CLINIC_OR_DEPARTMENT_OTHER): Payer: Self-pay | Admitting: Surgery

## 2022-06-21 ENCOUNTER — Other Ambulatory Visit: Payer: Self-pay

## 2022-06-21 DIAGNOSIS — E1122 Type 2 diabetes mellitus with diabetic chronic kidney disease: Secondary | ICD-10-CM

## 2022-06-21 DIAGNOSIS — K219 Gastro-esophageal reflux disease without esophagitis: Secondary | ICD-10-CM | POA: Insufficient documentation

## 2022-06-21 DIAGNOSIS — C50912 Malignant neoplasm of unspecified site of left female breast: Secondary | ICD-10-CM

## 2022-06-21 DIAGNOSIS — Z170 Estrogen receptor positive status [ER+]: Secondary | ICD-10-CM | POA: Diagnosis not present

## 2022-06-21 DIAGNOSIS — Z01818 Encounter for other preprocedural examination: Secondary | ICD-10-CM

## 2022-06-21 DIAGNOSIS — I129 Hypertensive chronic kidney disease with stage 1 through stage 4 chronic kidney disease, or unspecified chronic kidney disease: Secondary | ICD-10-CM | POA: Diagnosis not present

## 2022-06-21 DIAGNOSIS — I10 Essential (primary) hypertension: Secondary | ICD-10-CM | POA: Insufficient documentation

## 2022-06-21 DIAGNOSIS — N189 Chronic kidney disease, unspecified: Secondary | ICD-10-CM | POA: Diagnosis not present

## 2022-06-21 DIAGNOSIS — E119 Type 2 diabetes mellitus without complications: Secondary | ICD-10-CM | POA: Insufficient documentation

## 2022-06-21 DIAGNOSIS — R928 Other abnormal and inconclusive findings on diagnostic imaging of breast: Secondary | ICD-10-CM | POA: Diagnosis not present

## 2022-06-21 DIAGNOSIS — G8918 Other acute postprocedural pain: Secondary | ICD-10-CM | POA: Diagnosis not present

## 2022-06-21 DIAGNOSIS — Z7984 Long term (current) use of oral hypoglycemic drugs: Secondary | ICD-10-CM | POA: Diagnosis not present

## 2022-06-21 DIAGNOSIS — C50812 Malignant neoplasm of overlapping sites of left female breast: Secondary | ICD-10-CM | POA: Insufficient documentation

## 2022-06-21 DIAGNOSIS — C50412 Malignant neoplasm of upper-outer quadrant of left female breast: Secondary | ICD-10-CM | POA: Diagnosis not present

## 2022-06-21 DIAGNOSIS — Z7985 Long-term (current) use of injectable non-insulin antidiabetic drugs: Secondary | ICD-10-CM | POA: Diagnosis not present

## 2022-06-21 DIAGNOSIS — N1831 Chronic kidney disease, stage 3a: Secondary | ICD-10-CM

## 2022-06-21 HISTORY — DX: Gastro-esophageal reflux disease without esophagitis: K21.9

## 2022-06-21 HISTORY — PX: BREAST LUMPECTOMY WITH RADIOACTIVE SEED AND AXILLARY LYMPH NODE DISSECTION: SHX6656

## 2022-06-21 LAB — HEMOGLOBIN A1C
Est. average glucose Bld gHb Est-mCnc: 148 mg/dL
Hgb A1c MFr Bld: 6.8 % — ABNORMAL HIGH (ref 4.8–5.6)

## 2022-06-21 LAB — VITAMIN B12: Vitamin B-12: 1365 pg/mL — ABNORMAL HIGH (ref 232–1245)

## 2022-06-21 LAB — GLUCOSE, CAPILLARY
Glucose-Capillary: 126 mg/dL — ABNORMAL HIGH (ref 70–99)
Glucose-Capillary: 150 mg/dL — ABNORMAL HIGH (ref 70–99)

## 2022-06-21 SURGERY — BREAST LUMPECTOMY WITH RADIOACTIVE SEED AND AXILLARY LYMPH NODE DISSECTION
Anesthesia: Regional | Site: Breast | Laterality: Left

## 2022-06-21 MED ORDER — FENTANYL CITRATE (PF) 100 MCG/2ML IJ SOLN
50.0000 ug | Freq: Once | INTRAMUSCULAR | Status: AC
  Administered 2022-06-21: 50 ug via INTRAVENOUS

## 2022-06-21 MED ORDER — LIDOCAINE-EPINEPHRINE 2 %-1:100000 IJ SOLN
INTRAMUSCULAR | Status: DC | PRN
  Administered 2022-06-21: 4 mL via PERINEURAL

## 2022-06-21 MED ORDER — LACTATED RINGERS IV SOLN: INTRAVENOUS | Status: DC

## 2022-06-21 MED ORDER — MIDAZOLAM HCL 2 MG/2ML IJ SOLN
INTRAMUSCULAR | Status: AC
  Filled 2022-06-21: qty 2

## 2022-06-21 MED ORDER — MIDAZOLAM HCL 2 MG/2ML IJ SOLN
1.0000 mg | Freq: Once | INTRAMUSCULAR | Status: AC
  Administered 2022-06-21: 1 mg via INTRAVENOUS

## 2022-06-21 MED ORDER — MAGTRACE LYMPHATIC TRACER
INTRAMUSCULAR | Status: DC | PRN
  Administered 2022-06-21: 2 mL via INTRAMUSCULAR

## 2022-06-21 MED ORDER — CEFAZOLIN SODIUM-DEXTROSE 2-4 GM/100ML-% IV SOLN
2.0000 g | INTRAVENOUS | Status: AC
  Administered 2022-06-21: 2 g via INTRAVENOUS

## 2022-06-21 MED ORDER — PROPOFOL 10 MG/ML IV BOLUS
INTRAVENOUS | Status: DC | PRN
  Administered 2022-06-21: 130 mg via INTRAVENOUS

## 2022-06-21 MED ORDER — ACETAMINOPHEN 500 MG PO TABS: 1000.0000 mg | ORAL_TABLET | Freq: Once | ORAL | Status: DC | PRN

## 2022-06-21 MED ORDER — PHENYLEPHRINE 80 MCG/ML (10ML) SYRINGE FOR IV PUSH (FOR BLOOD PRESSURE SUPPORT)
PREFILLED_SYRINGE | INTRAVENOUS | Status: AC
  Filled 2022-06-21: qty 10

## 2022-06-21 MED ORDER — ACETAMINOPHEN 500 MG PO TABS
ORAL_TABLET | ORAL | Status: AC
  Filled 2022-06-21: qty 2

## 2022-06-21 MED ORDER — EPHEDRINE 5 MG/ML INJ
INTRAVENOUS | Status: AC
  Filled 2022-06-21: qty 5

## 2022-06-21 MED ORDER — ACETAMINOPHEN 160 MG/5ML PO SOLN: 1000.0000 mg | Freq: Once | ORAL | Status: DC | PRN

## 2022-06-21 MED ORDER — ACETAMINOPHEN 10 MG/ML IV SOLN: 1000.0000 mg | Freq: Once | INTRAVENOUS | Status: DC | PRN

## 2022-06-21 MED ORDER — ONDANSETRON HCL 4 MG/2ML IJ SOLN
INTRAMUSCULAR | Status: AC
  Filled 2022-06-21: qty 2

## 2022-06-21 MED ORDER — DEXAMETHASONE SODIUM PHOSPHATE 10 MG/ML IJ SOLN
INTRAMUSCULAR | Status: DC | PRN
  Administered 2022-06-21: 4 mg via INTRAVENOUS

## 2022-06-21 MED ORDER — OXYCODONE HCL 5 MG/5ML PO SOLN: 5.0000 mg | Freq: Once | ORAL | Status: DC | PRN

## 2022-06-21 MED ORDER — EPHEDRINE SULFATE (PRESSORS) 50 MG/ML IJ SOLN
INTRAMUSCULAR | Status: DC | PRN
  Administered 2022-06-21: 5 mg via INTRAVENOUS

## 2022-06-21 MED ORDER — FENTANYL CITRATE (PF) 100 MCG/2ML IJ SOLN: 25.0000 ug | INTRAMUSCULAR | Status: DC | PRN

## 2022-06-21 MED ORDER — CEFAZOLIN SODIUM-DEXTROSE 2-4 GM/100ML-% IV SOLN
INTRAVENOUS | Status: AC
  Filled 2022-06-21: qty 100

## 2022-06-21 MED ORDER — PROPOFOL 10 MG/ML IV BOLUS
INTRAVENOUS | Status: AC
  Filled 2022-06-21: qty 20

## 2022-06-21 MED ORDER — OXYCODONE HCL 5 MG PO TABS: 5.0000 mg | ORAL_TABLET | Freq: Once | ORAL | Status: DC | PRN

## 2022-06-21 MED ORDER — FENTANYL CITRATE (PF) 100 MCG/2ML IJ SOLN
INTRAMUSCULAR | Status: AC
  Filled 2022-06-21: qty 2

## 2022-06-21 MED ORDER — AMISULPRIDE (ANTIEMETIC) 5 MG/2ML IV SOLN
10.0000 mg | Freq: Once | INTRAVENOUS | Status: AC
  Administered 2022-06-21: 10 mg via INTRAVENOUS

## 2022-06-21 MED ORDER — PHENYLEPHRINE HCL (PRESSORS) 10 MG/ML IV SOLN
INTRAVENOUS | Status: DC | PRN
  Administered 2022-06-21: 40 ug via INTRAVENOUS
  Administered 2022-06-21: 80 ug via INTRAVENOUS
  Administered 2022-06-21: 160 ug via INTRAVENOUS
  Administered 2022-06-21: 40 ug via INTRAVENOUS
  Administered 2022-06-21: 80 ug via INTRAVENOUS
  Administered 2022-06-21: 160 ug via INTRAVENOUS
  Administered 2022-06-21 (×3): 80 ug via INTRAVENOUS

## 2022-06-21 MED ORDER — OXYCODONE HCL 5 MG PO TABS: 5.0000 mg | ORAL_TABLET | Freq: Four times a day (QID) | ORAL | 0 refills | Status: DC | PRN

## 2022-06-21 MED ORDER — ONDANSETRON HCL 4 MG/2ML IJ SOLN
INTRAMUSCULAR | Status: DC | PRN
  Administered 2022-06-21: 4 mg via INTRAVENOUS

## 2022-06-21 MED ORDER — SODIUM CHLORIDE 0.9 % IV SOLN
INTRAVENOUS | Status: AC
  Filled 2022-06-21: qty 10

## 2022-06-21 MED ORDER — FENTANYL CITRATE (PF) 100 MCG/2ML IJ SOLN
INTRAMUSCULAR | Status: DC | PRN
  Administered 2022-06-21 (×6): 25 ug via INTRAVENOUS

## 2022-06-21 MED ORDER — CLONIDINE HCL (ANALGESIA) 100 MCG/ML EP SOLN
EPIDURAL | Status: DC | PRN
  Administered 2022-06-21: 100 ug

## 2022-06-21 MED ORDER — ACETAMINOPHEN 500 MG PO TABS
1000.0000 mg | ORAL_TABLET | ORAL | Status: AC
  Administered 2022-06-21: 1000 mg via ORAL

## 2022-06-21 MED ORDER — BUPIVACAINE-EPINEPHRINE (PF) 0.5% -1:200000 IJ SOLN
INTRAMUSCULAR | Status: DC | PRN
  Administered 2022-06-21: 20 mL via PERINEURAL

## 2022-06-21 MED ORDER — BUPIVACAINE HCL (PF) 0.25 % IJ SOLN
INTRAMUSCULAR | Status: DC | PRN
  Administered 2022-06-21: 30 mL

## 2022-06-21 MED ORDER — LIDOCAINE 2% (20 MG/ML) 5 ML SYRINGE
INTRAMUSCULAR | Status: AC
  Filled 2022-06-21: qty 5

## 2022-06-21 MED ORDER — AMISULPRIDE (ANTIEMETIC) 5 MG/2ML IV SOLN
INTRAVENOUS | Status: AC
  Filled 2022-06-21: qty 4

## 2022-06-21 MED ORDER — SODIUM CHLORIDE 0.9 % IV SOLN
INTRAVENOUS | Status: DC | PRN
  Administered 2022-06-21: 500 mL

## 2022-06-21 SURGICAL SUPPLY — 48 items
ADH SKN CLS APL DERMABOND .7 (GAUZE/BANDAGES/DRESSINGS) ×1
APL PRP STRL LF DISP 70% ISPRP (MISCELLANEOUS) ×1
APPLIER CLIP 9.375 MED OPEN (MISCELLANEOUS) ×1
APR CLP MED 9.3 20 MLT OPN (MISCELLANEOUS) ×1
BINDER BREAST XLRG (GAUZE/BANDAGES/DRESSINGS) IMPLANT
BLADE SURG 15 STRL LF DISP TIS (BLADE) ×1 IMPLANT
BLADE SURG 15 STRL SS (BLADE) ×1
CANISTER SUC SOCK COL 7IN (MISCELLANEOUS) IMPLANT
CANISTER SUCT 1200ML W/VALVE (MISCELLANEOUS) ×1 IMPLANT
CHLORAPREP W/TINT 26 (MISCELLANEOUS) ×1 IMPLANT
CLIP APPLIE 9.375 MED OPEN (MISCELLANEOUS) ×1 IMPLANT
COVER BACK TABLE 60X90IN (DRAPES) ×1 IMPLANT
COVER MAYO STAND STRL (DRAPES) ×1 IMPLANT
COVER PROBE CYLINDRICAL 5X96 (MISCELLANEOUS) ×1 IMPLANT
DERMABOND ADVANCED .7 DNX12 (GAUZE/BANDAGES/DRESSINGS) ×1 IMPLANT
DRAIN CHANNEL 19F RND (DRAIN) IMPLANT
DRAPE LAPAROSCOPIC ABDOMINAL (DRAPES) ×1 IMPLANT
DRAPE UTILITY XL STRL (DRAPES) ×1 IMPLANT
ELECT COATED BLADE 2.86 ST (ELECTRODE) ×1 IMPLANT
ELECT REM PT RETURN 9FT ADLT (ELECTROSURGICAL) ×1
ELECTRODE REM PT RTRN 9FT ADLT (ELECTROSURGICAL) ×1 IMPLANT
EVACUATOR SILICONE 100CC (DRAIN) IMPLANT
GLOVE BIO SURGEON STRL SZ 6.5 (GLOVE) IMPLANT
GLOVE BIOGEL PI IND STRL 8 (GLOVE) ×1 IMPLANT
GLOVE ECLIPSE 8.0 STRL XLNG CF (GLOVE) ×1 IMPLANT
GOWN STRL REUS W/ TWL LRG LVL3 (GOWN DISPOSABLE) ×2 IMPLANT
GOWN STRL REUS W/ TWL XL LVL3 (GOWN DISPOSABLE) ×1 IMPLANT
GOWN STRL REUS W/TWL LRG LVL3 (GOWN DISPOSABLE) ×2
GOWN STRL REUS W/TWL XL LVL3 (GOWN DISPOSABLE) ×1
HEMOSTAT ARISTA ABSORB 3G PWDR (HEMOSTASIS) IMPLANT
HEMOSTAT SNOW SURGICEL 2X4 (HEMOSTASIS) IMPLANT
KIT MARKER MARGIN INK (KITS) ×1 IMPLANT
NDL HYPO 25X1 1.5 SAFETY (NEEDLE) ×1 IMPLANT
NEEDLE HYPO 25X1 1.5 SAFETY (NEEDLE) ×1 IMPLANT
NS IRRIG 1000ML POUR BTL (IV SOLUTION) ×1 IMPLANT
PACK BASIN DAY SURGERY FS (CUSTOM PROCEDURE TRAY) ×1 IMPLANT
PENCIL SMOKE EVACUATOR (MISCELLANEOUS) ×1 IMPLANT
SLEEVE SCD COMPRESS KNEE MED (STOCKING) ×1 IMPLANT
SPONGE T-LAP 4X18 ~~LOC~~+RFID (SPONGE) ×1 IMPLANT
SUT ETHILON 2 0 FS 18 (SUTURE) IMPLANT
SUT MNCRL AB 4-0 PS2 18 (SUTURE) ×1 IMPLANT
SUT VICRYL 3-0 CR8 SH (SUTURE) ×1 IMPLANT
SYR CONTROL 10ML LL (SYRINGE) ×1 IMPLANT
TOWEL GREEN STERILE FF (TOWEL DISPOSABLE) ×1 IMPLANT
TRACER MAGTRACE VIAL (MISCELLANEOUS) IMPLANT
TRAY FAXITRON CT DISP (TRAY / TRAY PROCEDURE) ×1 IMPLANT
TUBE CONNECTING 20X1/4 (TUBING) ×1 IMPLANT
YANKAUER SUCT BULB TIP NO VENT (SUCTIONS) ×1 IMPLANT

## 2022-06-21 NOTE — Anesthesia Procedure Notes (Signed)
Anesthesia Regional Block: Pectoralis block   Pre-Anesthetic Checklist: , timeout performed,  Correct Patient, Correct Site, Correct Laterality,  Correct Procedure, Correct Position, site marked,  Risks and benefits discussed,  Surgical consent,  Pre-op evaluation,  At surgeon's request and post-op pain management  Laterality: Left  Prep: chloraprep       Needles:  Injection technique: Single-shot      Needle Length: 9cm  Needle Gauge: 22     Additional Needles: Arrow StimuQuik ECHO Echogenic Stimulating PNB Needle  Procedures:,,,, ultrasound used (permanent image in chart),,    Narrative:  Start time: 06/21/2022 10:48 AM End time: 06/21/2022 10:54 AM Injection made incrementally with aspirations every 5 mL.  Performed by: Personally  Anesthesiologist: Oleta Mouse, MD

## 2022-06-21 NOTE — Discharge Instructions (Addendum)
[facility] Office Phone Number [phone]  BREAST BIOPSY/ PARTIAL MASTECTOMY: POST OP INSTRUCTIONS  Always review your discharge instruction sheet given to you by the facility where your surgery was performed.  IF YOU HAVE DISABILITY OR FAMILY LEAVE FORMS, YOU MUST BRING THEM TO THE OFFICE FOR PROCESSING.  DO NOT GIVE THEM TO YOUR DOCTOR.  A prescription for pain medication may be given to you upon discharge.  Take your pain medication as prescribed, if needed.  If narcotic pain medicine is not needed, then you may take acetaminophen (Tylenol) or ibuprofen (Advil) as needed. Take your usually prescribed medications unless otherwise directed If you need a refill on your pain medication, please contact your pharmacy.  They will contact our office to request authorization.  Prescriptions will not be filled after 5pm or on week-ends. You should eat very light the first 24 hours after surgery, such as soup, crackers, pudding, etc.  Resume your normal diet the day after surgery. Most patients will experience some swelling and bruising in the breast.  Ice packs and a good support bra will help.  Swelling and bruising can take several days to resolve.  It is common to experience some constipation if taking pain medication after surgery.  Increasing fluid intake and taking a stool softener will usually help or prevent this problem from occurring.  A mild laxative (Milk of Magnesia or Miralax) should be taken according to package directions if there are no bowel movements after 48 hours. Unless discharge instructions indicate otherwise, you may remove your bandages 24-48 hours after surgery, and you may shower at that time.  You may have steri-strips (small skin tapes) in place directly over the incision.  These strips should be left on the skin for 7-10 days.  If your surgeon used skin glue on the incision, you may shower in 24 hours.  The glue will flake off over the next 2-3 weeks.  Any  sutures or staples will be removed at the office during your follow-up visit. ACTIVITIES:  You may resume regular daily activities (gradually increasing) beginning the next day.  Wearing a good support bra or sports bra minimizes pain and swelling.  You may have sexual intercourse when it is comfortable. You may drive when you no longer are taking prescription pain medication, you can comfortably wear a seatbelt, and you can safely maneuver your car and apply brakes. RETURN TO WORK:  ______________________________________________________________________________________ You should see your doctor in the office for a follow-up appointment approximately two weeks after your surgery.  Your doctor's nurse will typically make your follow-up appointment when she calls you with your pathology report.  Expect your pathology report 2-3 business days after your surgery.  You may call to check if you do not hear from us after three days. OTHER INSTRUCTIONS: _______________________________________________________________________________________________ _____________________________________________________________________________________________________________________________________ _____________________________________________________________________________________________________________________________________ _____________________________________________________________________________________________________________________________________  WHEN TO CALL YOUR DOCTOR: Fever over 101.0 Nausea and/or vomiting. Extreme swelling or bruising. Continued bleeding from incision. Increased pain, redness, or drainage from the incision.  The clinic staff is available to answer your questions during regular business hours.  Please don't hesitate to call and ask to speak to one of the nurses for clinical concerns.  If you have a medical emergency, go to the nearest emergency room or call 911.  A surgeon from [facility] is always on call at the hospital.  For further questions, please visit [email or web address]    Post Anesthesia Home Care Instructions  Activity: Get plenty of rest for the remainder of   of the day. A responsible individual must stay with you for 24 hours following the procedure.  For the next 24 hours, DO NOT: -Drive a car -Paediatric nurse -Drink alcoholic beverages -Take any medication unless instructed by your physician -Make any legal decisions or sign important papers.  Meals: Start with liquid foods such as gelatin or soup. Progress to regular foods as tolerated. Avoid greasy, spicy, heavy foods. If nausea and/or vomiting occur, drink only clear liquids until the nausea and/or vomiting subsides. Call your physician if vomiting continues.  Special Instructions/Symptoms: Your throat may feel dry or sore from the anesthesia or the breathing tube placed in your throat during surgery. If this causes discomfort, gargle with warm salt water. The discomfort should disappear within 24 hours.  If you had a scopolamine patch placed behind your ear for the management of post- operative nausea and/or vomiting:  1. The medication in the patch is effective for 72 hours, after which it should be removed.  Wrap patch in a tissue and discard in the trash. Wash hands thoroughly with soap and water. 2. You may remove the patch earlier than 72 hours if you experience unpleasant side effects which may include dry mouth, dizziness or visual disturbances. 3. Avoid touching the patch. Wash your hands with soap and water after contact with the patch.    About my Jackson-Pratt Bulb Drain  What is a Jackson-Pratt bulb? A Jackson-Pratt is a soft, round device used to collect drainage. It is connected to a long, thin drainage catheter, which is held in place by one or two small stiches near your surgical incision site. When the bulb is squeezed, it forms a vacuum, forcing the drainage  to empty into the bulb.  Emptying the Jackson-Pratt bulb- To empty the bulb: 1. Release the plug on the top of the bulb. 2. Pour the bulb's contents into a measuring container which your nurse will provide. 3. Record the time emptied and amount of drainage. Empty the drain(s) as often as your     doctor or nurse recommends.  Date                  Time                    Amount (Drain 1)                 Amount (Drain 2)  _____________________________________________________________________  _____________________________________________________________________  _____________________________________________________________________  _____________________________________________________________________  _____________________________________________________________________  _____________________________________________________________________  _____________________________________________________________________  _____________________________________________________________________  Squeezing the Jackson-Pratt Bulb- To squeeze the bulb: 1. Make sure the plug at the top of the bulb is open. 2. Squeeze the bulb tightly in your fist. You will hear air squeezing from the bulb. 3. Replace the plug while the bulb is squeezed. 4. Use a safety pin to attach the bulb to your clothing. This will keep the catheter from     pulling at the bulb insertion site.  When to call your doctor- Call your doctor if: Drain site becomes red, swollen or hot. You have a fever greater than 101 degrees F. There is oozing at the drain site. Drain falls out (apply a guaze bandage over the drain hole and secure it with tape). Drainage increases daily not related to activity patterns. (You will usually have more drainage when you are active than when you are resting.) Drainage has a bad odor.      May have Tylenol today after 3:30 PM

## 2022-06-21 NOTE — Interval H&P Note (Signed)
History and Physical Interval Note:  06/21/2022 10:46 AM  Nicole Nunez  has presented today for surgery, with the diagnosis of LEFT BREAST CANCER.  The various methods of treatment have been discussed with the patient and family. After consideration of risks, benefits and other options for treatment, the patient has consented to  Procedure(s): LEFT BREAST LUMPECTOMY WITH RADIOACTIVE SEED AND SENTINEL LYMPH NODE BIOPSY (Left) as a surgical intervention.  The patient's history has been reviewed, patient examined, no change in status, stable for surgery.  I have reviewed the patient's chart and labs.  Questions were answered to the patient's satisfaction.     [place]

## 2022-06-21 NOTE — Anesthesia Preprocedure Evaluation (Signed)
Anesthesia Evaluation  Patient identified by MRN, date of birth, ID band Patient awake    Reviewed: Allergy & Precautions, NPO status , Patient's Chart, lab work & pertinent test results  History of Anesthesia Complications Negative for: history of anesthetic complications  Airway Mallampati: I  TM Distance: >3 FB Neck ROM: Full    Dental  (+) Edentulous Upper, Upper Dentures, Partial Lower,    Pulmonary neg pulmonary ROS   breath sounds clear to auscultation       Cardiovascular hypertension, Pt. on medications (-) angina (-) Past MI and (-) CHF  Rhythm:Regular     Neuro/Psych negative neurological ROS  negative psych ROS   GI/Hepatic Neg liver ROS,GERD  Controlled,,  Endo/Other  diabetes, Type 2    Renal/GU CRFRenal diseaseLab Results      Component                Value               Date                      CREATININE               1.13 (H)            06/15/2022           Lab Results      Component                Value               Date                      K                        4.3                 06/15/2022                Musculoskeletal negative musculoskeletal ROS (+)    Abdominal   Peds  Hematology negative hematology ROS (+) Lab Results      Component                Value               Date                      WBC                      6.7                 02/17/2022                HGB                      12.5                02/17/2022                HCT                      38.4                02/17/2022                MCV  84                  02/17/2022                PLT                      309                 02/17/2022              Anesthesia Other Findings   Reproductive/Obstetrics                             Anesthesia Physical Anesthesia Plan  ASA: 3  Anesthesia Plan: General and Regional   Post-op Pain Management: Regional block* and  Tylenol PO (pre-op)*   Induction: Intravenous  PONV Risk Score and Plan: 3 and Ondansetron, Dexamethasone, Propofol infusion and TIVA  Airway Management Planned: LMA  Additional Equipment: None  Intra-op Plan:   Post-operative Plan: Extubation in OR  Informed Consent: I have reviewed the patients History and Physical, chart, labs and discussed the procedure including the risks, benefits and alternatives for the proposed anesthesia with the patient or authorized representative who has indicated his/her understanding and acceptance.     Dental advisory given  Plan Discussed with: CRNA  Anesthesia Plan Comments:        Anesthesia Quick Evaluation

## 2022-06-21 NOTE — Transfer of Care (Signed)
Immediate Anesthesia Transfer of Care Note  Patient: Nicole Nunez  Procedure(s) Performed: LEFT BREAST LUMPECTOMY WITH RADIOACTIVE SEED AND AXILLARY NODE DISSECTION (Left: Breast)  Patient Location: PACU  Anesthesia Type:GA combined with regional for post-op pain  Level of Consciousness: drowsy  Airway & Oxygen Therapy: Patient Spontanous Breathing and Patient connected to face mask oxygen  Post-op Assessment: Report given to RN and Post -op Vital signs reviewed and stable  Post vital signs: Reviewed and stable  Last Vitals:   Vitals Value Taken Time  BP 158/78 (101)   Temp    Pulse 68 06/21/22 1332  Resp 0 06/21/22 1332  SpO2 100 % 06/21/22 1332  Vitals shown include unvalidated device data.  Last Pain:  Vitals:   06/21/22 0923  TempSrc: Oral  PainSc: 3       Patients Stated Pain Goal: 4 (25/00/37 0488)  Complications: No notable events documented.

## 2022-06-21 NOTE — Anesthesia Procedure Notes (Signed)
Procedure Name: LMA Insertion Date/Time: 06/21/2022 11:11 AM  Performed by: Lavonia Dana, CRNAPre-anesthesia Checklist: Patient identified, Emergency Drugs available, Suction available and Patient being monitored Patient Re-evaluated:Patient Re-evaluated prior to induction Oxygen Delivery Method: Circle system utilized Preoxygenation: Pre-oxygenation with 100% oxygen Induction Type: IV induction Ventilation: Mask ventilation without difficulty LMA: LMA inserted LMA Size: 4.0 Number of attempts: 1 Airway Equipment and Method: Bite block Placement Confirmation: positive ETCO2 Tube secured with: Tape Dental Injury: Teeth and Oropharynx as per pre-operative assessment

## 2022-06-21 NOTE — Op Note (Signed)
Preoperative diagnosis: Stage I left breast cancer  Postoperative diagnosis: Same  Procedure: Left breast seed localized lumpectomy, left axillary sentinel lymph node mapping with mag trace and left axillary completion lymph node dissection due to failure of MAC trace and significant fibrosis of left axillary contents  Surgeon: Erroll Luna, MD  Anesthesia: LMA with 0.25% Marcaine epinephrine pectoral block    Drains: 19 round  EBL: 75 cc  Specimen: Left breast mass with seed and clip to pathology, left axillary contents to pathology   Indications for procedure: The patient is a 77 year old female who presents for breast conserving surgery to left breast cancer.  Due to the biological nature of her cancer, a left axillary sentinel lymph node mapping procedure is recommended.The procedure has been discussed with the patient. Alternatives to surgery have been discussed with the patient.  Risks of surgery include bleeding,  Infection,  Seroma formation, death,  and the need for further surgery.   The patient understands and wishes to proceed. Sentinel lymph node mapping and dissection has been discussed with the patient.  Risk of bleeding,  Infection,  Seroma formation,  Additional procedures,,  Shoulder weakness ,  Shoulder stiffness, lymphedema nerve and blood vessel injury and reaction to the mapping dyes have been discussed.  Alternatives to surgery have been discussed with the patient.  The patient agrees to proceed.    Description of procedure: The patient was met in the holding area and questions answered.  Of note she had a seed placed in her left breast.  She also went for a pectoral block.  Seed was placed as an outpatient.  All questions were answered.  Films were reviewed.  She was then taken to the operative room.  She is placed supine upon the operating table.  After induction of general esthesia, 2 cc of MAC tracer injected and massaged for 5 minutes.  The left breast was then  prepped and draped in sterile fashion and a second timeout performed.  Films were available for review.  She received appropriate preoperative antibiotics.  Neoprobe was used to identify the seed at about 6:00 in the lower breast.  A transverse incision was made over this after infiltration of local anesthetic.  Dissection was carried down and all tissue and the seed and clip were excised with a grossly negative margin.  The cavity was marked.  It was irrigated and local anesthetic was infiltrated.  He was then closed with a deep layer 3-0 Vicryl.  4 Monocryl was used to close the skin.  The mag trace probe was then used.  An area of increased uptake was identified but was very weak.  A transverse incision was made along the inferior axillary hairline in the left.  Local anesthetic infiltrated.  Dissection was carried down into the axilla.  Once in the axilla, I could not map out the node very well.  I could not get any signal change or spike.  I did test the probe against the injection site and the probe was working.  Also, the axilla was quite fibrotic.  It was very dense.  As I enter the level 1 lymph node basin there is significant fibrosis of this area.  Given the findings I felt more than likely that a node dissection be necessary to properly assess the area.  Again there was significant fibrosis.  I then extended my incision more toward the back for better exposure.  Again with the mag trace probe I could not identify with any confidence  the sentinel node.  I think part of this was secondary to the amount of fibrosis in the axilla and level 1 lymph node basin.  I was then able to dissect out the best that I could.  I extended this up toward the axillary vein with care taken not to injure the vein.  It was quite dense.  I was able to dissect out the extra contents at level 1.  I could not feel anything under level 2 or level 3.  These contents were removed.  These were passed off the field.  It was difficult  to tell because the tissue was quite dense but there appeared to be multiple small nodes within this.  I palpated the remainder of the base and could not feel anything else significant.  The cavity was irrigated.  Is made hemostatic with Arista.  Through a separate stab incision and placed a 19 round drain and secured to the skin with 2-0 nylon.  I then closed the incision with 3-0 Vicryl.  4-0 Monocryl was used to close the incision in a subcuticular fashion.  Dermabond was applied to both incision.  Breast binder placed.  All counts were found to be correct.  The drain was placed to bulb suction.  The patient was awoke extubated taken to recovery in satisfactory condition.

## 2022-06-21 NOTE — H&P (Signed)
History of Present Illness: Nicole Nunez is a 77 y.o. female who is seen today as an office consultation for evaluation of Breast Cancer .   Patient presents for evaluation of abnormal left breast mammogram. She was noted on recent screening and subsequent diagnostic mammography to have a 9 mm mass in the left breast upper outer quadrant. Core biopsy showed invasive ductal carcinoma ER positive PR positive HER2/neu negative with AKI 67 of 50%. Denies any history of breast pain breast mass or nipple discharge. She relates a family member with breast problems but is unsure if it is cancer. She has only had 1 biopsy.  Review of Systems: A complete review of systems was obtained from the patient. I have reviewed this information and discussed as appropriate with the patient. See HPI as well for other ROS.    Medical History: Past Medical History:  Diagnosis Date  Diabetes mellitus without complication (CMS-HCC)  History of cancer   There is no problem list on file for this patient.  Past Surgical History:  Procedure Laterality Date  CATARACT EXTRACTION    Allergies  Allergen Reactions  Statins-Hmg-Coa Reductase Inhibitors Other (See Comments)  Rosuvastatin, atorvastatin, pravastatin, simvastatin   Current Outpatient Medications on File Prior to Visit  Medication Sig Dispense Refill  dapagliflozin propanediol (FARXIGA) 10 mg tablet Take 1 tablet by mouth once daily  olmesartan (BENICAR) 20 MG tablet Take 1 tablet by mouth once daily  OZEMPIC 0.25 mg or 0.5 mg (2 mg/3 mL) pen injector Inject 0.'5mg'$  SQ weekly.  pantoprazole (PROTONIX) 40 MG DR tablet Take 1 tablet by mouth once daily  pitavastatin magnesium 4 mg Tab Take by mouth   No current facility-administered medications on file prior to visit.   Family History  Problem Relation Age of Onset  Breast cancer Sister    Social History   Tobacco Use  Smoking Status Never  Smokeless Tobacco Never    Social History    Socioeconomic History  Marital status: Widowed  Tobacco Use  Smoking status: Never  Smokeless tobacco: Never   Objective:   Vitals:  05/27/22 0955  BP: (!) 142/88  Pulse: 89  Weight: 68.4 kg (150 lb 12.8 oz)  Height: 149.9 cm ('4\' 11"'$ )  PainSc: 0-No pain   Body mass index is 30.46 kg/m.  Physical Exam Exam conducted with a chaperone present.  HENT:  Head: Normocephalic.  Cardiovascular:  Rate and Rhythm: Normal rate.  Pulmonary:  Effort: Pulmonary effort is normal.  Breath sounds: No stridor.  Chest:  Breasts: Right: Normal. No inverted nipple, mass or nipple discharge.  Left: Normal. No inverted nipple, mass or nipple discharge.  Musculoskeletal:  General: Normal range of motion.  Cervical back: Normal range of motion.  Lymphadenopathy:  Upper Body:  Right upper body: No supraclavicular or axillary adenopathy.  Left upper body: No supraclavicular or axillary adenopathy.  Skin: General: Skin is warm.  Neurological:  General: No focal deficit present.  Mental Status: She is alert and oriented to person, place, and time.  Psychiatric:  Mood and Affect: Mood normal.  Behavior: Behavior normal.     Labs, Imaging and Diagnostic Testing:  Diagnosis Breast, left, needle core biopsy, 6:00 3 cmfn INVASIVE DUCTAL CARCINOMA, SEE NOTE TUBULE FORMATION: SCORE 3 NUCLEAR PLEOMORPHISM: SCORE 2 MITOTIC COUNT: SCORE 2 TOTAL SCORE: 7 OVERALL GRADE: 2 LYMPHOVASCULAR INVASION: NOT IDENTIFIED CANCER LENGTH: 0.9 CM CALCIFICATIONS: NOT IDENTIFIED SEE NOTE Diagnosis Note Dr. Martinique reviewed the case and concurs with the interpretation. A breast prognostic profile (  ER, PR, Ki-67 and HER2) is pending and will be reported in an addendum. [place] was notified on 05/19/2022. Claudette Laws MD Pathologist, Electronic Signature (Case signed 05/19/2022) Specimen Gross and Clinical Information Specimen Comment TIF: 2:30 pm, CIT: < 1 min, mass Specimen(s)  Obtained: Breast, left, needle core biopsy, 6:00 3 cmfn Gross Received in formalin labeled with the patient's name and "left breast 6 o'clock 3 cm fn" are four cores of fibroadipose 1 of 3 FINAL for Bibby, Madellyn B ([id number]) Gross(continued) tissue ranging from 1.2 to 1.5 cm in length, each measuring 0.2 cm in diameter. The specimen is entirely submitted in one block. Time in formalin 2:30 p.m., CIT less than 1 minute. (KW:kh 05/19/22) Stain(s) used in Diagnosis: The following stain(s) were used in diagnosing the case: Her2 FISH, PR-ACIS, Her2 by IHC, KI-67-ACIS, ER-ACIS. The control(s) stained appropriately. ADDITIONAL INFORMATION: Breast, left,needle core biopsy, 6:00 3 cmfn FLUORESCENCE IN-SITU HYBRIDIZATION Results: GROUP 2: HER2 **NEGATIVE** Equivocal form of amplification of the HER2 gene was detected in the IHC 2+ tissue sample received from this individual. HER2 FISH was performed by a technologist and cell imaging and analysis on the [employer]. First Analysis: RATIO OF HER2/CEN 17 SIGNALS 2.17 AVERAGE HER2 COPY NUMBER PER CELL 3.90 An additional 20 cells was analyzed by FISH in a separate region with invasive cancer. Additional Analysis: RATIO OF HER2/CEN 17 SIGNALS 2.19 AVERAGE HER2 COPY NUMBER PER CELL 3.72 The ratio of HER2/CEN 17 is within the normal range >=2.0 of HER2/CEN 17 and a copy number of HER2 signals per cell is < 4.0. Arch Pathol Lab Med 1:1,2018 COMMENT: Evidence is limited on the efficacy of HER2-targeted therapy in the small subset of cases with HER2/CEN 17 ratio >=2.0 and an average HER2 copy number <4.0/cell. In the first generation of adjuvant trastuzumab trials, patients in this subgroup who were randomized to the trastuzumab arm did not appear to derive an improvement in disease free or overall survival, but there were too few such cases to draw definitive conclusions. IHC expression for HER2 should be used to complement ISH and define HER2  status. If IHC result is not 3+ positive, it is recommended that the specimen be considered HER2 negative because of the low HER2 copy number by ISH and lack of protein overexpression. Casimer Lanius MD Pathologist, Electronic Signature ( Signed 05/24/2022) 1A) Breast, left, needle core biopsy, 6:00, 3 cmfn 2 of 3 FINAL for Yonan, Bonnie B ([id number]) ADDITIONAL INFORMATION:(continued) PROGNOSTIC INDICATORS Results: IMMUNOHISTOCHEMICAL AND MORPHOMETRIC ANALYSIS PERFORMED MANUALLY The tumor cells are EQUIVOCAL for Her2 (2+). Her2 by FISH will be performed and the results reported separately. Estrogen Receptor: 90%, POSITIVE, WEAK-MODERATE STAINING INTENSITY Progesterone Receptor: 15%, POSITIVE, WEAK-STRONG STAINING INTENSITY Proliferation Marker Ki67: 50% REFERENCE RANGE ESTROGEN RECEPTOR NEGATIVE 0% POSITIVE =>1% REFERENCE RANGE PROGESTERONE RECEPTOR NEGATIVE 0% POSITIVE =>1% All controls stained appropriately Tobin Chad MD Pathologist, Electronic Signature ( Signed 05/20/2022)  Narrative & Impression  CLINICAL DATA: Recall from screening for a possible left breast mass.  EXAM: DIGITAL DIAGNOSTIC UNILATERAL LEFT MAMMOGRAM WITH TOMOSYNTHESIS; ULTRASOUND LEFT BREAST LIMITED  TECHNIQUE: Left digital diagnostic mammography and breast tomosynthesis was performed.; Targeted ultrasound examination of the left breast was performed.  COMPARISON: Previous exam(s).  ACR Breast Density Category b: There are scattered areas of fibroglandular density.  FINDINGS: On the diagnostic spot-compression images, the possible mass in the inferior left breast persists as an 8 mm, oval mass with partly ill-defined and irregular borders.  Targeted left breast ultrasound is performed, showing  an oval hypo to anechoic mass left breast at 6 o'clock, 3 cm from the nipple, with mostly ill-defined margins, measuring 9 x 7 x 9 mm, consistent in size, shape and location to the  mammographic mass. No internal blood flow on color Doppler analysis. Sonographic imaging of the left axilla demonstrates normal lymph nodes. There are no enlarged or abnormal lymph nodes.  IMPRESSION: 1. Indeterminate 9 mm mass in the left breast at 6 o'clock, 3 cm from the nipple. Tissue sampling is recommended. Since this could reflect a complicated cyst, consider ultrasound-guided aspiration attempt prior to biopsy.  RECOMMENDATION: 1. Ultrasound-guided core needle biopsy of 6 o'clock position, 9 mm left breast mass. Consider an attempt at aspiration prior to biopsy.  I have discussed the findings and recommendations with the patient. If applicable, a reminder letter will be sent to the patient regarding the next appointment.  BI-RADS CATEGORY 4: Suspicious.   Electronically Signed By: Lajean Manes M.D. On: 05/04/2022 14:38   Assessment and Plan:   Diagnoses and all orders for this visit:  Malignant neoplasm of upper-outer quadrant of left breast in female, estrogen receptor positive     Stage I left breast cancer upper outer quadrant ER positive  Discussed breast conserving surgery versus mastectomy with reconstruction. Pros and cons of surgical options as well as long-term survival, local regional recurrence and need for additional adjuvant therapies reviewed with the patient today. She is opted for left breast seed localized lumpectomy with left axillary sent lymph node mapping. Risks and benefits of surgery reviewed. Risk of bleeding, infection, cosmetic deformity, wound infection, lymphedema, shoulder stiffness, left armpit swelling, injury to major blood vessel, nerve or other structure reviewed. Other complications of cardiovascular risk and observation of underlying medical problems with surgical intervention reviewed as well. She wishes to proceed with left breast seed localized lumpectomy and left axillary sentinel node mapping. Refer to medical radiation oncology  for opinions. Discussed use of seed as well as mag trace.  No follow-ups on file.  Kennieth Francois, MD

## 2022-06-21 NOTE — Progress Notes (Signed)
Assisted Dr. Ermalene Postin with left, pectoralis, ultrasound guided block. Side rails up, monitors on throughout procedure. See vital signs in flow sheet. Tolerated Procedure well.

## 2022-06-22 ENCOUNTER — Encounter (HOSPITAL_BASED_OUTPATIENT_CLINIC_OR_DEPARTMENT_OTHER): Payer: Self-pay | Admitting: Surgery

## 2022-06-23 ENCOUNTER — Other Ambulatory Visit: Payer: Self-pay

## 2022-06-23 MED ORDER — PANTOPRAZOLE SODIUM 40 MG PO TBEC: 40.0000 mg | DELAYED_RELEASE_TABLET | Freq: Every day | ORAL | 2 refills | Status: DC

## 2022-06-23 MED ORDER — LORATADINE 10 MG PO TABS: 10.0000 mg | ORAL_TABLET | Freq: Every day | ORAL | 2 refills | Status: DC

## 2022-06-24 DIAGNOSIS — C50912 Malignant neoplasm of unspecified site of left female breast: Secondary | ICD-10-CM | POA: Diagnosis not present

## 2022-06-27 ENCOUNTER — Encounter: Payer: Self-pay | Admitting: Surgery

## 2022-06-27 NOTE — Anesthesia Postprocedure Evaluation (Signed)
Anesthesia Post Note  Patient: Nicole Nunez  Procedure(s) Performed: LEFT BREAST LUMPECTOMY WITH RADIOACTIVE SEED AND AXILLARY NODE DISSECTION (Left: Breast)     Patient location during evaluation: PACU Anesthesia Type: Regional and General Level of consciousness: awake and alert Pain management: pain level controlled Vital Signs Assessment: post-procedure vital signs reviewed and stable Respiratory status: spontaneous breathing, nonlabored ventilation and respiratory function stable Cardiovascular status: blood pressure returned to baseline and stable Postop Assessment: no apparent nausea or vomiting Anesthetic complications: no   No notable events documented.  Last Vitals:  Vitals:   06/21/22 1445 06/21/22 1520  BP: 137/63 138/69  Pulse: 78 77  Resp: 12 16  Temp:  36.4 C  SpO2: 99% 97%    Last Pain:  Vitals:   06/22/22 1048  TempSrc:   PainSc: 1                  Khadeem Rockett

## 2022-06-29 ENCOUNTER — Encounter: Payer: Self-pay | Admitting: *Deleted

## 2022-06-29 ENCOUNTER — Telehealth: Payer: Self-pay | Admitting: *Deleted

## 2022-06-29 ENCOUNTER — Inpatient Hospital Stay: Payer: Medicare Other | Attending: Hematology and Oncology | Admitting: Hematology and Oncology

## 2022-06-29 VITALS — BP 136/65 | HR 88 | Temp 99.0°F | Resp 18 | Ht <= 58 in | Wt 150.4 lb

## 2022-06-29 DIAGNOSIS — Z79811 Long term (current) use of aromatase inhibitors: Secondary | ICD-10-CM | POA: Diagnosis not present

## 2022-06-29 DIAGNOSIS — Z79899 Other long term (current) drug therapy: Secondary | ICD-10-CM | POA: Diagnosis not present

## 2022-06-29 DIAGNOSIS — E119 Type 2 diabetes mellitus without complications: Secondary | ICD-10-CM | POA: Diagnosis not present

## 2022-06-29 DIAGNOSIS — Z7982 Long term (current) use of aspirin: Secondary | ICD-10-CM | POA: Diagnosis not present

## 2022-06-29 DIAGNOSIS — Z923 Personal history of irradiation: Secondary | ICD-10-CM | POA: Diagnosis not present

## 2022-06-29 DIAGNOSIS — Z780 Asymptomatic menopausal state: Secondary | ICD-10-CM

## 2022-06-29 DIAGNOSIS — R200 Anesthesia of skin: Secondary | ICD-10-CM | POA: Diagnosis not present

## 2022-06-29 DIAGNOSIS — Z7984 Long term (current) use of oral hypoglycemic drugs: Secondary | ICD-10-CM | POA: Insufficient documentation

## 2022-06-29 DIAGNOSIS — Z9221 Personal history of antineoplastic chemotherapy: Secondary | ICD-10-CM | POA: Diagnosis not present

## 2022-06-29 DIAGNOSIS — Z170 Estrogen receptor positive status [ER+]: Secondary | ICD-10-CM | POA: Insufficient documentation

## 2022-06-29 DIAGNOSIS — C50512 Malignant neoplasm of lower-outer quadrant of left female breast: Secondary | ICD-10-CM | POA: Diagnosis not present

## 2022-06-29 NOTE — Progress Notes (Signed)
Patient Care Team: Glendale Chard, MD as PCP - General (Internal Medicine) Mayford Knife, [facility] (Pharmacist) Nicholas Lose, MD as Consulting Physician (Hematology and Oncology) Rockwell Germany, RN as Oncology Nurse Navigator Mauro Kaufmann, RN as Oncology Nurse Navigator Erroll Luna, MD as Consulting Physician (General Surgery) Kyung Rudd, MD as Consulting Physician (Radiation Oncology)  DIAGNOSIS:  Encounter Diagnosis  Name Primary?   Malignant neoplasm of lower-outer quadrant of left breast of female, estrogen receptor positive ([place]) Yes    SUMMARY OF ONCOLOGIC HISTORY: Oncology History  Malignant neoplasm of lower-outer quadrant of left breast of female, estrogen receptor positive ([place])  05/18/2022 Initial Diagnosis   05/18/2022: Screening mammogram detected left breast mass by ultrasound it measured 9 mm, biopsy revealed grade 2 IDC ER 90%, PR 15%, Ki-67 50%, HER2 2+ by IHC, FISH negative ratio 2.17, Copy #3.9, additional 20 cells were examined ratio came at 2.19 and copy #3.72   06/05/2022 Cancer Staging   Staging form: Breast, AJCC 8th Edition - Clinical: Stage IA (cT1b, cN0, cM0, G2, ER+, PR+, HER2-) - Signed by Hayden Pedro, PA-C on 06/05/2022 Method of lymph node assessment: Clinical Histologic grading system: 3 grade system   06/29/2022 Cancer Staging   Staging form: Breast, AJCC 8th Edition - Pathologic: Stage IA (pT1c, pN0, cM0, G3, ER+, PR+, HER2-) - Signed by Nicholas Lose, MD on 06/29/2022 Histologic grading system: 3 grade system     CHIEF COMPLIANT: Follow up after surgery  INTERVAL HISTORY: Nicole Nunez is a 77 y.o. female is here because of recent diagnosis of left breast cancer. She presents to the clinic for a follow-up to discuss final pathology report and then determine if Oncotype DX testing will need to be sent. She states surgery went well. She denies any pain or discomfort in breast. Her only complaint was the drains was  uncomfortable.     ALLERGIES:  is allergic to statins.  MEDICATIONS:  Current Outpatient Medications  Medication Sig Dispense Refill   ascorbic acid (VITAMIN C) 500 MG tablet Take 500 mg by mouth daily.     aspirin EC 81 MG tablet Take 81 mg by mouth daily.     b complex vitamins tablet Take 1 tablet by mouth daily.     Calcium Carbonate (CALCIUM 600 PO) Take 1 tablet by mouth daily.      Cholecalciferol (VITAMIN D3) 125 MCG (5000 UT) CAPS Take 1 capsule by mouth daily.      dapagliflozin propanediol (FARXIGA) 10 MG TABS tablet Take 1 tablet (10 mg total) by mouth daily. 90 tablet 2   Lancet Devices (ONE TOUCH DELICA LANCING DEV) MISC USE AS DIRECTED TO CHECK BLOOD SUGARS ONCE DAILY dx: e11.22 1 each 2   loratadine (CLARITIN) 10 MG tablet Take 1 tablet (10 mg total) by mouth daily. 30 tablet 2   magnesium gluconate (MAGONATE) 500 MG tablet Take 500 mg by mouth daily.     Multiple Vitamin (MULTIVITAMIN) tablet Take 1 tablet by mouth daily.     olmesartan (BENICAR) 20 MG tablet Take 1 tablet (20 mg total) by mouth daily. 100 tablet 2   Omega-3 Fatty Acids (FISH OIL) 1000 MG CAPS Take 1 capsule by mouth daily.     ONETOUCH VERIO test strip Use to check blood sugars daily. E11.01 100 strip 3   oxyCODONE (OXY IR/ROXICODONE) 5 MG immediate release tablet Take 1 tablet (5 mg total) by mouth every 6 (six) hours as needed for severe pain. 15 tablet 0  pantoprazole (PROTONIX) 40 MG tablet Take 1 tablet (40 mg total) by mouth daily. 100 tablet 2   Pitavastatin Magnesium 4 MG TABS Take 1 tablet by mouth daily. 30 tablet 11   Probiotic Product (PROBIOTIC PO) Take by mouth.     Semaglutide,0.25 or 0.'5MG'$ /DOS, (OZEMPIC, 0.25 OR 0.5 MG/DOSE,) 2 MG/3ML SOPN Inject 0.'5mg'$  SQ weekly. 3 mL 0   zinc gluconate 50 MG tablet Take 50 mg by mouth daily.     No current facility-administered medications for this visit.    PHYSICAL EXAMINATION: ECOG PERFORMANCE STATUS: 1 - Symptomatic but completely  ambulatory  Vitals:   06/29/22 0837  BP: 136/65  Pulse: 88  Resp: 18  Temp: 99 F (37.2 C)  SpO2: 100%   Filed Weights   06/29/22 0837  Weight: 150 lb 6.4 oz (68.2 kg)      LABORATORY DATA:  I have reviewed the data as listed    Latest Ref Rng & Units 06/15/2022   10:15 AM 02/17/2022   11:47 AM 09/21/2021    9:07 AM  CMP  Glucose 70 - 99 mg/dL 118  107  117   BUN 8 - 23 mg/dL '17  23  19   '$ Creatinine 0.44 - 1.00 mg/dL 1.13  1.24  1.08   Sodium 135 - 145 mmol/L 137  144  141   Potassium 3.5 - 5.1 mmol/L 4.3  4.3  4.8   Chloride 98 - 111 mmol/L 102  104  101   CO2 22 - 32 mmol/L '27  23  28   '$ Calcium 8.9 - 10.3 mg/dL 8.9  9.7  9.6   Total Protein 6.0 - 8.5 g/dL  7.4    Total Bilirubin 0.0 - 1.2 mg/dL  0.6    Alkaline Phos 44 - 121 IU/L  120    AST 0 - 40 IU/L  28    ALT 0 - 32 IU/L  21      Lab Results  Component Value Date   WBC 6.7 02/17/2022   HGB 12.5 02/17/2022   HCT 38.4 02/17/2022   MCV 84 02/17/2022   PLT 309 02/17/2022   NEUTROABS 3.2 11/11/2008    ASSESSMENT & PLAN:  Malignant neoplasm of lower-outer quadrant of left breast of female, estrogen receptor positive ([place]) 05/18/2022: Screening mammogram detected left breast mass by ultrasound it measured 9 mm, biopsy revealed grade 2 IDC ER 90%, PR 15%, Ki-67 50%, HER2 2+ by IHC, FISH negative ratio 2.17, Copy #3.9, additional 20 cells were examined ratio came at 2.19 and copy #3.72   06/21/2022: Left lumpectomy: Grade 3 IDC 1.5 cm, margins negative, no lymph node (adipose tissue was in the sentinel node) ER 90% weak to moderate, PR 15% weak to strong, HER2 negative, Ki-67 50%  Pathology counseling: I discussed the final pathology report of the patient provided  a copy of this report. I discussed the margins as well as lymph node surgeries. We also discussed the final staging along with previously performed ER/PR and HER-2/neu testing.  Treatment plan: Oncotype DX testing to determine if chemotherapy would be of  any benefit followed by Adjuvant radiation therapy followed by Adjuvant antiestrogen therapy  We will discuss in tumor board regarding the sentinel lymph node biopsy being only adipose tissue.  I imagine that there are no great options to do another sentinel lymph node biopsy if it did not identify it in the first place.  Any additional surgery could be ALND which would be totally unnecessary.  Return to clinic based upon Oncotype DX test results   No orders of the defined types were placed in this encounter.  The patient has a good understanding of the overall plan. she agrees with it. she will call with any problems that may develop before the next visit here. Total time spent: 30 mins including face to face time and time spent for planning, charting and co-ordination of care   [name], MD 06/29/22    I Gardiner Coins am acting as a Education administrator for [employer]  I have reviewed the above documentation for accuracy and completeness, and I agree with the above.

## 2022-06-29 NOTE — Telephone Encounter (Signed)
Ordered oncotype per Dr. Gudena. Sent requisition to pathology and exact sciences. 

## 2022-06-29 NOTE — Assessment & Plan Note (Signed)
05/18/2022: Screening mammogram detected left breast mass by ultrasound it measured 9 mm, biopsy revealed grade 2 IDC ER 90%, PR 15%, Ki-67 50%, HER2 2+ by IHC, FISH negative ratio 2.17, Copy #3.9, additional 20 cells were examined ratio came at 2.19 and copy #3.72   06/21/2022: Left lumpectomy: Grade 3 IDC 1.5 cm, margins negative, no lymph node (adipose tissue was in the sentinel node) ER 90% weak to moderate, PR 15% weak to strong, HER2 negative, Ki-67 50%  Pathology counseling: I discussed the final pathology report of the patient provided  a copy of this report. I discussed the margins as well as lymph node surgeries. We also discussed the final staging along with previously performed ER/PR and HER-2/neu testing.  Treatment plan: Oncotype DX testing to determine if chemotherapy would be of any benefit followed by Adjuvant radiation therapy followed by Adjuvant antiestrogen therapy  Return to clinic based upon Oncotype DX test results

## 2022-06-30 ENCOUNTER — Telehealth: Payer: Self-pay

## 2022-06-30 NOTE — Progress Notes (Cosign Needed)
06-30-2022: Patient wanted to follow up with [employer] re enrollment application. Patient stated she received a letter and application is missing providers information. Sample was given to patient this morning. Sent task to [name] to check application when she goes to the office.  [place] Pharmacist Assistant [phone]

## 2022-07-04 NOTE — Progress Notes (Signed)
[facility] PRE[facility] Weekly Session  Patient Details  Name: Nicole Nunez MRN: [id number] Date of Birth: [birth year] Age: 77 y.o. PCP: Glendale Chard, MD  Vitals:   06/28/22 1430  Weight: 150 lb 3.2 oz (68.1 kg)     [facility] Weekly seesion - 07/04/22 0900       [facility] "PREP" Location   [facility] "PREP" Location [name]      Weekly Session   Topic Discussed --   review and goal setting   Minutes exercised this week 0 minutes    Classes attended to date 14             Barnett Hatter 07/04/2022, 9:34 AM

## 2022-07-05 ENCOUNTER — Other Ambulatory Visit: Payer: Self-pay | Admitting: Internal Medicine

## 2022-07-06 ENCOUNTER — Telehealth: Payer: Self-pay | Admitting: *Deleted

## 2022-07-06 LAB — SURGICAL PATHOLOGY

## 2022-07-06 NOTE — Telephone Encounter (Signed)
Spoke with patient to inform her that we would be cancelling her oncotype due to repeating her prognostics and she is now triple negative.  Appt scheduled and confirmed for 07/07/22 at 245pm to discuss.Patient was very confused by all this.  Attempted to explain this to her and told her Dr. Lindi Adie would explain this further at her appt. Patient verbalized understanding.

## 2022-07-07 ENCOUNTER — Inpatient Hospital Stay: Payer: Medicare Other | Admitting: Hematology and Oncology

## 2022-07-07 ENCOUNTER — Encounter: Payer: Self-pay | Admitting: *Deleted

## 2022-07-07 VITALS — BP 125/75 | HR 87 | Temp 97.2°F | Resp 18 | Wt 146.4 lb

## 2022-07-07 DIAGNOSIS — E119 Type 2 diabetes mellitus without complications: Secondary | ICD-10-CM | POA: Diagnosis not present

## 2022-07-07 DIAGNOSIS — Z170 Estrogen receptor positive status [ER+]: Secondary | ICD-10-CM

## 2022-07-07 DIAGNOSIS — Z7982 Long term (current) use of aspirin: Secondary | ICD-10-CM | POA: Diagnosis not present

## 2022-07-07 DIAGNOSIS — Z79811 Long term (current) use of aromatase inhibitors: Secondary | ICD-10-CM | POA: Diagnosis not present

## 2022-07-07 DIAGNOSIS — Z923 Personal history of irradiation: Secondary | ICD-10-CM | POA: Diagnosis not present

## 2022-07-07 DIAGNOSIS — C50512 Malignant neoplasm of lower-outer quadrant of left female breast: Secondary | ICD-10-CM

## 2022-07-07 DIAGNOSIS — R200 Anesthesia of skin: Secondary | ICD-10-CM | POA: Diagnosis not present

## 2022-07-07 DIAGNOSIS — Z7984 Long term (current) use of oral hypoglycemic drugs: Secondary | ICD-10-CM | POA: Diagnosis not present

## 2022-07-07 DIAGNOSIS — Z9221 Personal history of antineoplastic chemotherapy: Secondary | ICD-10-CM | POA: Diagnosis not present

## 2022-07-07 DIAGNOSIS — Z79899 Other long term (current) drug therapy: Secondary | ICD-10-CM | POA: Diagnosis not present

## 2022-07-07 MED ORDER — ONDANSETRON HCL 8 MG PO TABS: 8.0000 mg | ORAL_TABLET | Freq: Three times a day (TID) | ORAL | 1 refills | Status: DC | PRN

## 2022-07-07 MED ORDER — PROCHLORPERAZINE MALEATE 10 MG PO TABS: 10.0000 mg | ORAL_TABLET | Freq: Four times a day (QID) | ORAL | 1 refills | Status: DC | PRN

## 2022-07-07 MED ORDER — LIDOCAINE-PRILOCAINE 2.5-2.5 % EX CREA: TOPICAL_CREAM | CUTANEOUS | 3 refills | Status: DC

## 2022-07-07 NOTE — Assessment & Plan Note (Signed)
05/18/2022: Screening mammogram detected left breast mass by ultrasound it measured 9 mm, biopsy revealed grade 2 IDC ER 90%, PR 15%, Ki-67 50%, HER2 2+ by IHC, FISH negative ratio 2.17, Copy #3.9, additional 20 cells were examined ratio came at 2.19 and copy #3.72    06/21/2022: Left lumpectomy: Grade 3 IDC 1.5 cm, margins negative, no lymph node (adipose tissue was in the sentinel node) ER 90% weak to moderate, PR 15% weak to strong, HER2 negative, Ki-67 50% Repeat prognostic panel: ER 0%, PR 0%, Ki-67 35%, HER2 2+ by IHC FISH negative ratio 1.72 (triple negative breast cancer)   Treatment plan: Oncotype DX testing was canceled because the cancer was triple negative.  Recommend adjuvant chemotherapy with Taxotere and Cytoxan every 3 weeks x 4 Adjuvant radiation therapy followed by Adjuvant antiestrogen therapy -----------------------------------------------------------------------------------------------------------------------------------  Chemo counseling: Discussed risks and benefits of chemotherapy including the risk of cytopenias send nausea hair loss, blood work changes including LFTs as well as neuropathy risk from Taxotere and long-term bone marrow dysfunction/acute leukemia risk as well.  Return to clinic in 2 weeks to start chemotherapy.

## 2022-07-07 NOTE — Progress Notes (Signed)
START ON PATHWAY REGIMEN - Breast     A cycle is every 21 days:     Docetaxel      Cyclophosphamide   **Always confirm dose/schedule in your pharmacy ordering system**  Patient Characteristics: Postoperative without Neoadjuvant Therapy (Pathologic Staging), Invasive Disease, Adjuvant Therapy, HER2 Negative, ER Negative, Node Negative, pT1a-c, N1mi or pT1c or Higher, pN0 Therapeutic Status: Postoperative without Neoadjuvant Therapy (Pathologic Staging) AJCC Grade: G3 AJCC N Category: pN0 AJCC M Category: cM0 ER Status: Negative (-) AJCC 8 Stage Grouping: IB HER2 Status: Negative (-) Oncotype Dx Recurrence Score: Not Appropriate AJCC T Category: pT1c PR Status: Negative (-) Intent of Therapy: Curative Intent, Discussed with Patient 

## 2022-07-07 NOTE — Progress Notes (Signed)
Patient Care Team: Glendale Chard, MD as PCP - General (Internal Medicine) Mayford Knife, [facility] (Pharmacist) Nicholas Lose, MD as Consulting Physician (Hematology and Oncology) Rockwell Germany, RN as Oncology Nurse Navigator Mauro Kaufmann, RN as Oncology Nurse Navigator Erroll Luna, MD as Consulting Physician (General Surgery) Kyung Rudd, MD as Consulting Physician (Radiation Oncology)  DIAGNOSIS:  Encounter Diagnosis  Name Primary?   Malignant neoplasm of lower-outer quadrant of left breast of female, estrogen receptor positive ([place]) Yes    SUMMARY OF ONCOLOGIC HISTORY: Oncology History  Malignant neoplasm of lower-outer quadrant of left breast of female, estrogen receptor positive ([place])  05/18/2022 Initial Diagnosis   05/18/2022: Screening mammogram detected left breast mass by ultrasound it measured 9 mm, biopsy revealed grade 2 IDC ER 90%, PR 15%, Ki-67 50%, HER2 2+ by IHC, FISH negative ratio 2.17, Copy #3.9, additional 20 cells were examined ratio came at 2.19 and copy #3.72   06/05/2022 Cancer Staging   Staging form: Breast, AJCC 8th Edition - Clinical: Stage IA (cT1b, cN0, cM0, G2, ER+, PR+, HER2-) - Signed by Hayden Pedro, PA-C on 06/05/2022 Method of lymph node assessment: Clinical Histologic grading system: 3 grade system   06/29/2022 Cancer Staging   Staging form: Breast, AJCC 8th Edition - Pathologic: Stage IA (pT1c, pN0, cM0, G3, ER+, PR+, HER2-) - Signed by Nicholas Lose, MD on 06/29/2022 Histologic grading system: 3 grade system     CHIEF COMPLIANT: Follow-up to discuss pathology report and treatment plan.  INTERVAL HISTORY: Nicole Nunez is a 77 y.o. female is here because of recent diagnosis of left breast cancer. She presents to the clinic for a follow-up to discuss final pathology report and treatment plan. She reports that she does have some numbness.   ALLERGIES:  is allergic to statins.  MEDICATIONS:  Current Outpatient  Medications  Medication Sig Dispense Refill   ascorbic acid (VITAMIN C) 500 MG tablet Take 500 mg by mouth daily.     aspirin EC 81 MG tablet Take 81 mg by mouth daily.     b complex vitamins tablet Take 1 tablet by mouth daily.     Calcium Carbonate (CALCIUM 600 PO) Take 1 tablet by mouth daily.      Cholecalciferol (VITAMIN D3) 125 MCG (5000 UT) CAPS Take 1 capsule by mouth daily.      dapagliflozin propanediol (FARXIGA) 10 MG TABS tablet Take 1 tablet (10 mg total) by mouth daily. 90 tablet 2   Lancet Devices (ONE TOUCH DELICA LANCING DEV) MISC USE AS DIRECTED TO CHECK BLOOD SUGARS ONCE DAILY dx: e11.22 1 each 2   loratadine (CLARITIN) 10 MG tablet Take 1 tablet (10 mg total) by mouth daily. 30 tablet 2   magnesium gluconate (MAGONATE) 500 MG tablet Take 500 mg by mouth daily.     Multiple Vitamin (MULTIVITAMIN) tablet Take 1 tablet by mouth daily.     olmesartan (BENICAR) 20 MG tablet Take 1 tablet (20 mg total) by mouth daily. 100 tablet 2   Omega-3 Fatty Acids (FISH OIL) 1000 MG CAPS Take 1 capsule by mouth daily.     ONETOUCH VERIO test strip USE TO CHECK BLOOD SUGAR DAILY 100 strip 2   oxyCODONE (OXY IR/ROXICODONE) 5 MG immediate release tablet Take 1 tablet (5 mg total) by mouth every 6 (six) hours as needed for severe pain. 15 tablet 0   pantoprazole (PROTONIX) 40 MG tablet Take 1 tablet (40 mg total) by mouth daily. 100 tablet 2  Pitavastatin Magnesium 4 MG TABS Take 1 tablet by mouth daily. 30 tablet 11   Probiotic Product (PROBIOTIC PO) Take by mouth.     Semaglutide,0.25 or 0.5MG/DOS, (OZEMPIC, 0.25 OR 0.5 MG/DOSE,) 2 MG/3ML SOPN Inject 0.62m SQ weekly. 3 mL 0   zinc gluconate 50 MG tablet Take 50 mg by mouth daily.     No current facility-administered medications for this visit.    PHYSICAL EXAMINATION: ECOG PERFORMANCE STATUS: 1 - Symptomatic but completely ambulatory  Vitals:   07/07/22 1449  BP: 125/75  Pulse: 87  Resp: 18  Temp: (!) 97.2 F (36.2 C)  SpO2: 99%    Filed Weights   07/07/22 1449  Weight: 146 lb 7 oz (66.4 kg)     LABORATORY DATA:  I have reviewed the data as listed    Latest Ref Rng & Units 06/15/2022   10:15 AM 02/17/2022   11:47 AM 09/21/2021    9:07 AM  CMP  Glucose 70 - 99 mg/dL 118  107  117   BUN 8 - 23 mg/dL 17  23  19   $ Creatinine 0.44 - 1.00 mg/dL 1.13  1.24  1.08   Sodium 135 - 145 mmol/L 137  144  141   Potassium 3.5 - 5.1 mmol/L 4.3  4.3  4.8   Chloride 98 - 111 mmol/L 102  104  101   CO2 22 - 32 mmol/L 27  23  28   $ Calcium 8.9 - 10.3 mg/dL 8.9  9.7  9.6   Total Protein 6.0 - 8.5 g/dL  7.4    Total Bilirubin 0.0 - 1.2 mg/dL  0.6    Alkaline Phos 44 - 121 IU/L  120    AST 0 - 40 IU/L  28    ALT 0 - 32 IU/L  21      Lab Results  Component Value Date   WBC 6.7 02/17/2022   HGB 12.5 02/17/2022   HCT 38.4 02/17/2022   MCV 84 02/17/2022   PLT 309 02/17/2022   NEUTROABS 3.2 11/11/2008    ASSESSMENT & PLAN:  Malignant neoplasm of lower-outer quadrant of left breast of female, estrogen receptor positive [place] 05/18/2022: Screening mammogram detected left breast mass by ultrasound it measured 9 mm, biopsy revealed grade 2 IDC ER 90%, PR 15%, Ki-67 50%, HER2 2+ by IHC, FISH negative ratio 2.17, Copy #3.9, additional 20 cells were examined ratio came at 2.19 and copy #3.72    06/21/2022: Left lumpectomy: Grade 3 IDC 1.5 cm, margins negative, no lymph node (adipose tissue was in the sentinel node) ER 90% weak to moderate, PR 15% weak to strong, HER2 negative, Ki-67 50% Repeat prognostic panel: ER 0%, PR 0%, Ki-67 35%, HER2 2+ by IHC FISH negative ratio 1.72 (triple negative breast cancer)   Treatment plan: Oncotype DX testing was canceled because the cancer was triple negative.  Recommend adjuvant chemotherapy with Taxotere and Cytoxan every 3 weeks x 4 Adjuvant radiation therapy followed by Adjuvant antiestrogen  therapy -----------------------------------------------------------------------------------------------------------------------------------  Chemo counseling: Discussed risks and benefits of chemotherapy including the risk of cytopenias send nausea hair loss, blood work changes including LFTs as well as neuropathy risk from Taxotere and long-term bone marrow dysfunction/acute leukemia risk as well.  Diabetes: We will not prescribe dexamethasone oral with her chemo.  Return to clinic in 2 weeks to start chemotherapy. We will request Dr. CBrantley Stagefor port placement.  No orders of the defined types were placed in this encounter.  The patient has  a good understanding of the overall plan. she agrees with it. she will call with any problems that may develop before the next visit here. Total time spent: 30 mins including face to face time and time spent for planning, charting and co-ordination of care   [name], MD 07/07/22    I Gardiner Coins am acting as a Education administrator for [employer]  I have reviewed the above documentation for accuracy and completeness, and I agree with the above.

## 2022-07-07 NOTE — Research (Signed)
S2205, ICE COMPRESS: RANDOMIZED TRIAL OF LIMB CRYOCOMPRESSION VERSUS CONTINUOUS COMPRESSION VERSUS LOW CYCLIC COMPRESSION FOR THE PREVENTION  OF TAXANE-INDUCED PERIPHERAL NEUROPATHY   Patient Nicole Nunez was identified by Dr. Lindi Adie as a potential candidate for the above listed study.  This Clinical Research Nurse met with Nicole Nunez, [id number], on 07/07/22 in a manner and location that ensures patient privacy to discuss participation in the above listed research study.  Patient is Accompanied by her sister .  A copy of the informed consent document and separate HIPAA Authorization was provided to the patient.  Patient reads, speaks, and understands [place].   Patient was provided with the business card of this Nurse and encouraged to contact the research team with any questions.  Approximately 25 minutes was spent with the patient reviewing the informed consent documents.  Patient was provided the option of taking informed consent documents home to review and was encouraged to review at their convenience with their support network, including other care providers. The pt decided to take the consent form and HIPAA form home to read at her convenience.  The pt informed the nurse that she does have a history of neuropathy related to her history of diabetes.  She said that her feet has some numbness along with some numbness in the tips of her fingers.  The pt was told that she may not qualify for the study due to her history of neuropathy.  The nurse will review the eligibility criteria and let the pt know if she will qualify for study participation. The pt verbalized understanding. The pt was thanked for her interest in the study.  Brion Aliment RN, BSN, CCRP Clinical Research Nurse Lead 07/07/2022 4:40 PM

## 2022-07-08 ENCOUNTER — Encounter: Payer: Self-pay | Admitting: *Deleted

## 2022-07-08 ENCOUNTER — Telehealth: Payer: Self-pay | Admitting: *Deleted

## 2022-07-08 ENCOUNTER — Other Ambulatory Visit: Payer: Self-pay

## 2022-07-08 NOTE — Telephone Encounter (Signed)
S2205, ICE COMPRESS: RANDOMIZED TRIAL OF LIMB CRYOCOMPRESSION VERSUS CONTINUOUS COMPRESSION VERSUS LOW CYCLIC COMPRESSION FOR THE PREVENTION  OF TAXANE-INDUCED PERIPHERAL NEUROPATHY   The pt was informed that she does not qualify for this study due to her history of peripheral neuropathy to her feet and hands.  The protocol states "participants must not have pre-existing clinical peripheral neuropathy from any cause".  The pt verbalized understanding.  Dr. Lindi Adie was informed that she is not eligible for study entry.    DCP-001: Use of a Clinical Trial Screening Tool to Address Cancer Health Disparities in the [place] [facility])   Since the pt was screened for one of our NCI trials - the pt was approached about this above study.  The pt was informed that this is not a clinical trial, but it is a one-time collection of information to better understand the clinical trial participation process.  The pt was in agreement to read the DCP consent, and she will consider participation in this study. The pt was thanked for her time.   Brion Aliment RN, BSN, CCRP Clinical Research Nurse Lead 07/08/2022 10:19 AM

## 2022-07-12 ENCOUNTER — Encounter (HOSPITAL_COMMUNITY): Payer: Self-pay

## 2022-07-12 ENCOUNTER — Ambulatory Visit: Payer: Self-pay | Admitting: Surgery

## 2022-07-12 DIAGNOSIS — C50912 Malignant neoplasm of unspecified site of left female breast: Secondary | ICD-10-CM

## 2022-07-12 NOTE — Progress Notes (Signed)
[facility] PRE[facility] Weekly Session  Patient Details  Name: Nicole Nunez MRN: [id number] Date of Birth: [birth year] Age: 77 y.o. PCP: Glendale Chard, MD  Vitals:   07/12/22 1430  Weight: 146 lb 6.4 oz (66.4 kg)     [facility] Weekly seesion - 07/12/22 1600       [facility] "PREP" Location   [facility] "PREP" Location [name]      Weekly Session   Topic Discussed --   goals and activity worksheet   Classes attended to date 15             Barnett Hatter 07/12/2022, 4:31 PM

## 2022-07-14 ENCOUNTER — Telehealth: Payer: Self-pay | Admitting: Radiation Oncology

## 2022-07-14 ENCOUNTER — Encounter: Payer: Self-pay | Admitting: *Deleted

## 2022-07-14 DIAGNOSIS — Z170 Estrogen receptor positive status [ER+]: Secondary | ICD-10-CM

## 2022-07-14 NOTE — Telephone Encounter (Signed)
LVM to schedule FUN/SIM 3 weeks after chemo ends per instructions

## 2022-07-15 NOTE — Progress Notes (Signed)
Pharmacist Chemotherapy Monitoring - Initial Assessment    Anticipated start date: 07/22/22    The following has been reviewed per standard work regarding the patient's treatment regimen: The patient's diagnosis, treatment plan and drug doses, and organ/hematologic function Lab orders and baseline tests specific to treatment regimen  The treatment plan start date, drug sequencing, and pre-medications Prior authorization status  Patient's documented medication list, including drug-drug interaction screen and prescriptions for anti-emetics and supportive care specific to the treatment regimen The drug concentrations, fluid compatibility, administration routes, and timing of the medications to be used The patient's access for treatment and lifetime cumulative dose history, if applicable  The patient's medication allergies and previous infusion related reactions, if applicable   Changes made to treatment plan:  N/A  Follow up needed:  Pending authorization for treatment    Judge Stall, [facility], 07/15/2022  3:57 PM

## 2022-07-18 ENCOUNTER — Ambulatory Visit: Payer: Self-pay | Admitting: Surgery

## 2022-07-18 DIAGNOSIS — C50912 Malignant neoplasm of unspecified site of left female breast: Secondary | ICD-10-CM

## 2022-07-19 ENCOUNTER — Encounter: Payer: Self-pay | Admitting: *Deleted

## 2022-07-19 ENCOUNTER — Telehealth: Payer: Self-pay | Admitting: Pharmacist

## 2022-07-19 ENCOUNTER — Inpatient Hospital Stay: Payer: Medicare Other

## 2022-07-19 ENCOUNTER — Inpatient Hospital Stay: Payer: Medicare Other | Admitting: Pharmacist

## 2022-07-19 DIAGNOSIS — Z9221 Personal history of antineoplastic chemotherapy: Secondary | ICD-10-CM | POA: Diagnosis not present

## 2022-07-19 DIAGNOSIS — R200 Anesthesia of skin: Secondary | ICD-10-CM | POA: Diagnosis not present

## 2022-07-19 DIAGNOSIS — Z170 Estrogen receptor positive status [ER+]: Secondary | ICD-10-CM | POA: Diagnosis not present

## 2022-07-19 DIAGNOSIS — E119 Type 2 diabetes mellitus without complications: Secondary | ICD-10-CM | POA: Diagnosis not present

## 2022-07-19 DIAGNOSIS — Z79811 Long term (current) use of aromatase inhibitors: Secondary | ICD-10-CM | POA: Diagnosis not present

## 2022-07-19 DIAGNOSIS — C50512 Malignant neoplasm of lower-outer quadrant of left female breast: Secondary | ICD-10-CM | POA: Diagnosis not present

## 2022-07-19 DIAGNOSIS — Z7984 Long term (current) use of oral hypoglycemic drugs: Secondary | ICD-10-CM | POA: Diagnosis not present

## 2022-07-19 DIAGNOSIS — Z923 Personal history of irradiation: Secondary | ICD-10-CM | POA: Diagnosis not present

## 2022-07-19 DIAGNOSIS — Z79899 Other long term (current) drug therapy: Secondary | ICD-10-CM | POA: Diagnosis not present

## 2022-07-19 DIAGNOSIS — Z7982 Long term (current) use of aspirin: Secondary | ICD-10-CM | POA: Diagnosis not present

## 2022-07-19 NOTE — Telephone Encounter (Signed)
Contacted patient to scheduled appointments. Left message with appointment details and a call back number if patient had any questions or could not accommodate the time we provided.   

## 2022-07-19 NOTE — Progress Notes (Signed)
[place]       Telephone: [phone]?Fax: [phone]   Oncology Clinical Pharmacist Practitioner Initial Assessment  Nicole Nunez is a 77 y.o. female with a diagnosis of breast cancer. They were contacted today via in-person visit.  Indication/Regimen Docetaxel (Taxotere) and cyclophosphamide (Cytoxan) are being used appropriately for treatment of breast cancer by Dr. Nicholas Lose.      Wt Readings from Last 1 Encounters:  07/12/22 146 lb 6.4 oz (66.4 kg)    Estimated body surface area is 1.65 meters squared as calculated from the following:   Height as of 06/29/22: '4\' 10"'$  (1.473 m).   Weight as of 07/12/22: 146 lb 6.4 oz (66.4 kg).  The dosing regimen cycle is every 21 days x 4 cycles  Docetaxel (75 mg/m2) on Day 1 Cyclophosphamide (600 mg/m2) on Day 1 Pegfilgrastim (6 mg) on Day 3  It is planned to continue until treatment plan completion or unacceptable toxicity. The tentative start date is: 08/09/22  Dose Modifications - docetaxel reduced to 60 mg/m2 - cyclophosphamide reduced to 500 mg/m2 - dexamethasone prescription removed by Dr. Lindi Adie likely d/t T2DM   Allergies Allergies  Allergen Reactions   Statins Other (See Comments)    Rosuvastatin, atorvastatin, pravastatin, simvastatin muscle aching     Vitals = no vitals or labs done today for chemotherapy education visit    07/12/2022    2:30 PM 07/07/2022    2:49 PM 06/29/2022    8:37 AM  Oncology Vitals  Height   147 cm  Weight 66.407 kg 66.424 kg 68.221 kg  Weight (lbs) 146 lbs 6 oz 146 lbs 7 oz 150 lbs 6 oz  BMI 30.6 kg/m2   30.6 kg/m2 30.61 kg/m2   30.61 kg/m2 31.43 kg/m2   31.43 kg/m2  Temp  97.2 F (36.2 C) 99 F (37.2 C)  Pulse Rate  87 88  BP  125/75 136/65  Resp  18 18  SpO2  99 % 100 %  BSA (m2) 1.65 m2   1.65 m2 1.65 m2   1.65 m2 1.67 m2   1.67 m2     Laboratory Data    Latest Ref Rng & Units 02/17/2022   11:47 AM 09/21/2021    9:07 AM 02/04/2021   10:24 AM   CBC EXTENDED  WBC 3.4 - 10.8 x10E3/uL 6.7  4.8  6.0   RBC 3.77 - 5.28 x10E6/uL 4.59  4.24  3.87   Hemoglobin 11.1 - 15.9 g/dL 12.5  11.6  10.6   HCT 34.0 - 46.6 % 38.4  35.9  32.5   Platelets 150 - 450 x10E3/uL 309  293  304        Latest Ref Rng & Units 06/15/2022   10:15 AM 02/17/2022   11:47 AM 09/21/2021    9:07 AM  CMP  Glucose 70 - 99 mg/dL 118  107  117   BUN 8 - 23 mg/dL '17  23  19   '$ Creatinine 0.44 - 1.00 mg/dL 1.13  1.24  1.08   Sodium 135 - 145 mmol/L 137  144  141   Potassium 3.5 - 5.1 mmol/L 4.3  4.3  4.8   Chloride 98 - 111 mmol/L 102  104  101   CO2 22 - 32 mmol/L '27  23  28   '$ Calcium 8.9 - 10.3 mg/dL 8.9  9.7  9.6   Total Protein 6.0 - 8.5 g/dL  7.4    Total Bilirubin 0.0 - 1.2 mg/dL  0.6    Alkaline Phos 44 - 121 IU/L  120    AST 0 - 40 IU/L  28    ALT 0 - 32 IU/L  21     Contraindications Contraindications were reviewed? Yes Contraindications to therapy were identified? No   Safety Precautions The following safety precautions were reviewed:  Fever: reviewed the importance of having a thermometer and the Centers for Disease Control and Prevention (CDC) definition of fever which is 100.24F (38C) or higher. Patient should call 24/7 triage at [phone] if experiencing a fever or any other symptoms Decreased white blood cells (WBCs) and increased risk for infection Decreased platelet count and increased risk of bleeding Decreased hemoglobin, part of the red blood cells that carry iron and oxygen Hair Loss Fatigue Fluid retention or swelling (edema) Peripheral Neuropathy Mouth sores Rash or itchy skin Muscle or joint pain or weakness Nausea or vomiting Diarrhea Nail Changes Hypersensitivity reactions Secondary Malignancies Hemorrhagic cystitis Pneumonitis Intimacy, sexual activity, contraception, fertility Handling body fluids and waste Patient has baseline diabetic neuropathy  Medication Reconciliation Current Outpatient Medications   Medication Sig Dispense Refill   ascorbic acid (VITAMIN C) 500 MG tablet Take 1,000 mg by mouth daily.     aspirin EC 81 MG tablet Take 81 mg by mouth daily.     b complex vitamins tablet Take 1 tablet by mouth daily.     Calcium Carbonate (CALCIUM 600 PO) Take 1 tablet by mouth daily.      Cholecalciferol (VITAMIN D3) 125 MCG (5000 UT) CAPS Take 1 capsule by mouth daily.      dapagliflozin propanediol (FARXIGA) 10 MG TABS tablet Take 1 tablet (10 mg total) by mouth daily. 90 tablet 2   Lancet Devices (ONE TOUCH DELICA LANCING DEV) MISC USE AS DIRECTED TO CHECK BLOOD SUGARS ONCE DAILY dx: e11.22 1 each 2   loratadine (CLARITIN) 10 MG tablet Take 1 tablet (10 mg total) by mouth daily. 30 tablet 2   magnesium gluconate (MAGONATE) 500 MG tablet Take 500 mg by mouth daily.     Multiple Vitamin (MULTIVITAMIN) tablet Take 1 tablet by mouth daily.     olmesartan (BENICAR) 20 MG tablet Take 1 tablet (20 mg total) by mouth daily. 100 tablet 2   ONETOUCH VERIO test strip USE TO CHECK BLOOD SUGAR DAILY 100 strip 2   pantoprazole (PROTONIX) 40 MG tablet Take 1 tablet (40 mg total) by mouth daily. 100 tablet 2   Pitavastatin Magnesium 4 MG TABS Take 1 tablet by mouth daily. 30 tablet 11   Semaglutide,0.25 or 0.'5MG'$ /DOS, (OZEMPIC, 0.25 OR 0.5 MG/DOSE,) 2 MG/3ML SOPN Inject 0.'5mg'$  SQ weekly. 3 mL 0   zinc gluconate 50 MG tablet Take 50 mg by mouth daily.     lidocaine-prilocaine (EMLA) cream Apply to affected area once (Patient not taking: Reported on 07/19/2022) 30 g 3   ondansetron (ZOFRAN) 8 MG tablet Take 1 tablet (8 mg total) by mouth every 8 (eight) hours as needed for nausea or vomiting. Start on the third day after chemotherapy. (Patient not taking: Reported on 07/19/2022) 30 tablet 1   oxyCODONE (OXY IR/ROXICODONE) 5 MG immediate release tablet Take 1 tablet (5 mg total) by mouth every 6 (six) hours as needed for severe pain. (Patient not taking: Reported on 07/19/2022) 15 tablet 0   Probiotic Product  (PROBIOTIC PO) Take by mouth. (Patient not taking: Reported on 07/19/2022)     prochlorperazine (COMPAZINE) 10 MG tablet Take 1 tablet (10 mg total) by  mouth every 6 (six) hours as needed for nausea or vomiting. (Patient not taking: Reported on 07/19/2022) 30 tablet 1   No current facility-administered medications for this visit.   Medication reconciliation is based on the patient's most recent medication list in the electronic medical record (EMR) including herbal products and OTC medications.   The patient's medication list was reviewed today with the patient? Yes   Drug-drug interactions (DDIs) DDIs were evaluated? Yes Significant DDIs identified? No   Drug-Food Interactions Drug-food interactions were evaluated? Yes Drug-food interactions identified? No   Follow-up Plan  Treatment start date: 08/09/22 Port placement date: 07/21/22 Treatment plan was deferred to 08/09/22 due to wound healing. Reviewed prescriptions, premedications, and chemotherapy regimen. Reviewed possible side effects and management strategies Has baseline diabetic neuropathy Clinical pharmacy will assist Dr. Nicholas Lose and Nicole Nunez on an as needed basis going forward  Nicole Nunez participated in the discussion, expressed understanding, and voiced agreement with the above plan. All questions were answered to her satisfaction. The patient was advised to contact the clinic at [phone] with any questions or concerns prior to her return visit.   I spent 60 minutes assessing the patient.  Raina Mina, RPH-CPP, 07/19/2022 12:01 PM  **Disclaimer: This note was dictated with voice recognition software. Similar sounding words can inadvertently be transcribed and this note may contain transcription errors which may not have been corrected upon publication of note.**

## 2022-07-19 NOTE — Therapy (Signed)
OUTPATIENT PHYSICAL THERAPY BREAST CANCER POST OP FOLLOW UP   Patient Name: Nicole Nunez MRN: [id number] DOB:29-Sep-1945, 77 y.o., female Today's Date: 07/19/2022  END OF SESSION:   Past Medical History:  Diagnosis Date   Breast cancer ([place]) 05/18/2022   CKD stage 3 due to type 2 diabetes mellitus (HCC)    DM (diabetes mellitus) ([place])    GERD (gastroesophageal reflux disease)    High cholesterol    HTN (hypertension)    Past Surgical History:  Procedure Laterality Date   BREAST BIOPSY Left 05/18/2022   [place] LT BREAST BX W LOC DEV 1ST LESION IMG BX SPEC US GUIDE 05/18/2022 [facility]   BREAST BIOPSY Left 06/20/2022   [place] LT RADIOACTIVE SEED LOC 06/20/2022 [facility]   BREAST LUMPECTOMY WITH RADIOACTIVE SEED AND AXILLARY LYMPH NODE DISSECTION Left 06/21/2022   Procedure: LEFT BREAST LUMPECTOMY WITH RADIOACTIVE SEED AND AXILLARY NODE DISSECTION;  Surgeon: Erroll Luna, MD;  Location: [facility];  Service: General;  Laterality: Left;   CATARACT EXTRACTION, BILATERAL  01/2018   first was 12/2017   corn removal Bilateral 06/2017   Patient Active Problem List   Diagnosis Date Noted   Malignant neoplasm of lower-outer quadrant of left breast of female, estrogen receptor positive ([place]) 05/31/2022   Statin myopathy 02/04/2021   Notalgia 01/29/2020   Vitamin D deficiency disease 01/29/2020   Class 1 obesity due to excess calories with serious comorbidity and body mass index (BMI) of 32.0 to 32.9 in adult 01/29/2020   Type 2 diabetes mellitus with stage 3 chronic kidney disease, without long-term current use of insulin ([place]) 05/24/2018   Chronic renal disease, stage III ([place]) 05/24/2018   Parenchymal renal hypertension 05/24/2018   Pure hypercholesterolemia 05/24/2018   Class 1 obesity due to excess calories with serious comorbidity and body mass index (BMI) of 31.0 to 31.9 in adult 05/24/2018    PCP: Dr. Glendale Chard   REFERRING PROVIDER: Dr.  Brantley Stage  REFERRING DIAG: left breast cancer  THERAPY DIAG:  No diagnosis found.  Rationale for Evaluation and Treatment: Rehabilitation  ONSET DATE: 05/27/22  SUBJECTIVE:                                                                                                                                                                                           SUBJECTIVE STATEMENT: He was going to put the port in anyway tomorrow and they are going to see if they can get any lymph nodes out.    PERTINENT HISTORY:  Patient was diagnosed left IDC. It measures 13m and is located in the upper outer quadrant. It is  ER/PR positive with a Ki67 of 50%.  lumpectomy and SLNB on 06/21/22 but with no lymph nodes revealed in tissue. Will be having chemotherapy TC followed by radiation as well as sentinel lymph node mapping.     PATIENT GOALS:  Reassess how my recovery is going related to arm function, pain, and swelling.  PAIN:  Are you having pain? No  PRECAUTIONS: Recent Surgery, left UE Lymphedema risk  ACTIVITY LEVEL / LEISURE: back to normal   OBJECTIVE:   PATIENT SURVEYS:  QUICK DASH: 0%  OBSERVATIONS: Having to pack the incision in the axilla which is now covered - will be closed tomorrow.  Not wearing compression.  Denies swelling    POSTURE:  Increased kyphosis and forward shoulders.   LYMPHEDEMA ASSESSMENT:   UPPER EXTREMITY AROM/PROM:   A/PROM LEFT   eval 07/20/22  Shoulder extension 60 60  Shoulder flexion 160 160  Shoulder abduction 160 165  Shoulder internal rotation     Shoulder external rotation 90 85                          (Blank rows = not tested)   LYMPHEDEMA ASSESSMENTS:    LANDMARK RIGHT   eval  10 cm proximal to olecranon process 31  Olecranon process 25  10 cm proximal to ulnar styloid process 21.5  Just proximal to ulnar styloid process 16.4  Across hand at thumb web space 19.4  At base of 2nd digit 6.7  (Blank rows = not tested)   LANDMARK LEFT    eval  10 cm proximal to olecranon process 31  Olecranon process 25  10 cm proximal to ulnar styloid process 20  Just proximal to ulnar styloid process 16.3  Across hand at thumb web space 19  At base of 2nd digit 6.2  (Blank rows = not tested)  A. Side-by-side-stand Held for 10 sec  X 1 point Not held for 10 sec ? 0 points  Not attempted  ? 0 points  If 0 points, end Balance Tests   B. Semi-Tandem Stand Held for 10 sec  x 1 point  Not held for 10 sec  ? 0 points Not attempted  ? 0 points (circle reason above) If 0 points, end Balance Tests Number of seconds held if less than 10 sec:   C. Tandem Stand Held for 10 sec  X 2 points  Held for 3 to 9.99 sec  ? 1 point  Held for < than 3 sec  ? 0 points Not attempted ? 0 points (circle reason above) Number of seconds held if less than 10 sec:   Total Balance Tests score (sum points):    D. Time for First Gait Speed Test (sec) Time for 3 or 4 meters:     Aids for walk:   If the participant was unable to do the walk: ? 0 points  For 3-Meter Walk: If time is more than 6.52 sec: ? 1 point If time is 4.66 to 6.52 sec: ? 2 points If time is 3.62 to 4.65 sec: ? 3 points If time is less than 3.62 sec: X 4 points  E. Scoring the Repeated Chair Test Participant unable to complete 5 chair stands or completes stands in >60 sec: ? 0 points If chair stand time is 16.70 sec or more:  ? 1 points If chair stand time is 13.70 to 16.69 sec:  ? 2 points If chair stand time is 11.20 to  13.69 sec:  ? 3 points If chair stand time is 11.19 sec or less:  X 4 points (10.16 sec)   Test Scores Total Balance Test score _____ points Gait Speed Test score _____ points Chair Stand Test score _____ points Total Score _12____ points (sum of points above)  Score of ?10 indicates increased risk of mobility disability at follow-up and indicate a need for more targeted interventions MCID: 1.0  PATIENT EDUCATION:  Education details: post op  instruction per below and watching for balance changes and weakness with chemotherapy Person educated: Patient Education method: Explanation, Verbal cues, and Handouts Education comprehension: verbalized understanding  HOME EXERCISE PROGRAM: Reviewed previously given post op HEP.   ASSESSMENT:  CLINICAL IMPRESSION: Pt presents post lumpectomy with with no lymph nodes retrieved.  She will be having SLNB tomorrow so we will start her SOZO surveillance from date as of tomorrow.  Otherwise pt is doing very well.  She has some neuropathy going into chemotherapy but scores a perfect on the SPPB.    Pt will benefit from skilled therapeutic intervention to improve on the following deficits: Decreased knowledge of precautions, impaired UE functional use, pain, decreased ROM, postural dysfunction.   PT treatment/interventions: ADL/Self care home management, Therapeutic exercises, Patient/Family education, and Re-evaluation   GOALS: Goals reviewed with patient? Yes  LONG TERM GOALS:  (STG=LTG)  GOALS Name Target Date  Goal status  1 Pt will demonstrate she has regained full shoulder ROM and function post operatively compared to baselines.  Baseline: 07/20/22 MET                    PLAN:  PT FREQUENCY/DURATION: SOZO only until at least 2/29/26 for 3 months  PLAN FOR NEXT SESSION: [facility]  [address]  [place] [address]  [phone]  After Breast Cancer Class It is recommended you attend the ABC class to be educated on lymphedema risk reduction. This class is free of charge and lasts for 1 hour. It is a 1-time class. You will need to download the Webex app either on your phone or computer. We will send you a link the night before or the morning of the class. You should be able to click on that link to join the class. This is not a confidential class. You don't have to turn your camera on, but other participants may be able to see your  email address.  Scar massage You can begin gentle scar massage to you incision sites. Gently place one hand on the incision and move the skin (without sliding on the skin) in various directions. Do this for a few minutes and then you can gently massage either coconut oil or vitamin E cream into the scars.  Compression garment You should continue wearing your compression bra until you feel like you no longer have swelling.  Home exercise Program Continue doing the exercises you were given until you feel like you can do them without feeling any tightness at the end.   Walking Program Studies show that 30 minutes of walking per day (fast enough to elevate your heart rate) can significantly reduce the risk of a cancer recurrence. If you can't walk due to other medical reasons, we encourage you to find another activity you could do (like a stationary bike or water exercise).  Posture After breast cancer surgery, people frequently sit with rounded shoulders posture because it puts their incisions on slack and feels better. If  you sit like this and scar tissue forms in that position, you can become very tight and have pain sitting or standing with good posture. Try to be aware of your posture and sit and stand up tall to heal properly.  Follow up PT: It is recommended you return every 3 months for the first 3 years following surgery to be assessed on the SOZO machine for an L-Dex score. This helps prevent clinically significant lymphedema in 95% of patients. These follow up screens are 10 minute appointments that you are not billed for.  Stark Bray, PT 07/19/2022, 10:54 AM

## 2022-07-19 NOTE — Progress Notes (Signed)
[facility] PREP Evaluation  Patient Details  Name: Nicole Nunez MRN: [id number] Date of Birth: 1946/02/14 Age: 77 y.o. PCP: Glendale Chard, MD  Vitals:   07/19/22 1545  BP: 110/62  Pulse: 87  SpO2: 98%  Weight: 146 lb 3.2 oz (66.3 kg)     [facility] Eval - 07/19/22 1700       [facility] "PREP" Location   [facility] "PREP" Location [name]      Referral    Referring Provider [place]    Program Start Date 04/19/22    Program End Date 07/19/22      Measurement   Waist Circumference End Program 37 inches    Hip Circumference End Program 41.5 inches    Body fat 32.6 percent      Information for Trainer   Goals reset      Mobility and Daily Activities   I find it easy to walk up or down two or more flights of stairs. 3    I have no trouble taking out the trash. 4    I do housework such as vacuuming and dusting on my own without difficulty. 4    I can easily lift a gallon of milk (8lbs). 4    I can easily walk a mile. 3    I have no trouble reaching into high cupboards or reaching down to pick up something from the floor. 4    I do not have trouble doing out-door work such as Armed forces logistics/support/administrative officer, raking leaves, or gardening. 4      Mobility and Daily Activities   I feel younger than my age. 4    I feel independent. 4    I feel energetic. 3    I live an active life.  4    I feel strong. 4    I feel healthy. 4    I feel active as other people my age. 4      How fit and strong are you.   Fit and Strong Total Score 53            Past Medical History:  Diagnosis Date   Breast cancer ([place]) 05/18/2022   CKD stage 3 due to type 2 diabetes mellitus ([place])    DM (diabetes mellitus) ([place])    GERD (gastroesophageal reflux disease)    High cholesterol    HTN (hypertension)    Past Surgical History:  Procedure Laterality Date   BREAST BIOPSY Left 05/18/2022   [place] LT BREAST BX W LOC DEV 1ST LESION IMG BX SPEC US GUIDE 05/18/2022 [facility]   BREAST BIOPSY Left 06/20/2022    [place] LT RADIOACTIVE SEED LOC 06/20/2022 [facility]   BREAST LUMPECTOMY WITH RADIOACTIVE SEED AND AXILLARY LYMPH NODE DISSECTION Left 06/21/2022   Procedure: LEFT BREAST LUMPECTOMY WITH RADIOACTIVE SEED AND AXILLARY NODE DISSECTION;  Surgeon: Erroll Luna, MD;  Location: [facility];  Service: General;  Laterality: Left;   CATARACT EXTRACTION, BILATERAL  01/2018   first was 12/2017   corn removal Bilateral 06/2017   Social History   Tobacco Use  Smoking Status Never  Smokeless Tobacco Never  Attended 16+ workouts, 9 educational sessions Fit testing: Cardio march: 230 to 241 Sit to stand: 16 to 16 Bicep curl: 14 to 16 Encouraged to exercise to manage stress and to keep energy up. Continue exercise snacks when energy/time is limited.  Avoid added sugars as much as possible. Rest when tired. Accept help when offered.   Pam  Tally Joe 07/19/2022, 5:05 PM

## 2022-07-20 ENCOUNTER — Ambulatory Visit: Payer: Medicare Other | Attending: Surgery | Admitting: Rehabilitation

## 2022-07-20 ENCOUNTER — Encounter (HOSPITAL_COMMUNITY): Payer: Self-pay | Admitting: Surgery

## 2022-07-20 ENCOUNTER — Other Ambulatory Visit: Payer: Self-pay

## 2022-07-20 ENCOUNTER — Encounter: Payer: Self-pay | Admitting: Rehabilitation

## 2022-07-20 DIAGNOSIS — C50512 Malignant neoplasm of lower-outer quadrant of left female breast: Secondary | ICD-10-CM | POA: Insufficient documentation

## 2022-07-20 DIAGNOSIS — R293 Abnormal posture: Secondary | ICD-10-CM | POA: Diagnosis not present

## 2022-07-20 DIAGNOSIS — Z9189 Other specified personal risk factors, not elsewhere classified: Secondary | ICD-10-CM | POA: Diagnosis not present

## 2022-07-20 DIAGNOSIS — Z170 Estrogen receptor positive status [ER+]: Secondary | ICD-10-CM | POA: Diagnosis not present

## 2022-07-20 DIAGNOSIS — Z483 Aftercare following surgery for neoplasm: Secondary | ICD-10-CM | POA: Insufficient documentation

## 2022-07-20 NOTE — Progress Notes (Signed)
PCP - Glendale Chard, MD Cardiologist - Lyman Bishop, MD Oncology - Nicholas Lose, MD  PPM/ICD - denies  EKG - 02/17/22  CPAP - n/a  Fasting Blood Sugar - 100 - 132 Checks Blood Sugar once/day A1C - 6.8 - 06/20/22 Ozempic - last day - 07/18/22  Blood Thinner Instructions: n/a Aspirin Instructions - last dose - 07/20/22 per patient  Patient was instructed - As of today, STOP taking any Aspirin (unless otherwise instructed by your surgeon) Aleve, Naproxen, Ibuprofen, Motrin, Advil, Goody's, BC's, all herbal medications, fish oil, and all vitamins.  ERAS Protcol - yes, until 12: 30 o'clock  COVID TEST- n/a  Anesthesia review: no  Patient verbally denies any shortness of breath, fever, cough and chest pain during phone call   -------------  SDW INSTRUCTIONS given:  Your procedure is scheduled on Thursday, February 29th, 2024.  Report to [facility] Main Entrance "A" at 13:00 A.M., and check in at the Admitting office.  Call this number if you have problems the morning of surgery:  [phone]   Remember:  Do not eat after midnight the night before your surgery  You may drink clear liquids until 12:30 the morning of your surgery.   Clear liquids allowed are: Water, Non-Citrus Juices (without pulp), Carbonated Beverages, Clear Tea, Black Coffee Only, and Gatorade    Take these medicines the morning of surgery with A SIP OF WATER: Claritin, Prilosec PRN: Zofran, Oxycodone, Compazine  Hold Farxiga 72 hrs prior surgery  How do I manage my blood sugar before surgery? Check your blood sugar at least 4 times a day, starting 2 days before surgery, to make sure that the level is not too high or low.  Check your blood sugar the morning of your surgery when you wake up and every 2 hours until you get to the Short Stay unit.  If your blood sugar is less than 70 mg/dL, you will need to treat for low blood sugar: Do not take insulin. Treat a low blood sugar (less than 70 mg/dL)  with  cup of clear juice (cranberry or apple), 4 glucose tablets, OR glucose gel. Recheck blood sugar in 15 minutes after treatment (to make sure it is greater than 70 mg/dL). If your blood sugar is not greater than 70 mg/dL on recheck, call [phone] for further instructions. Report your blood sugar to the short stay nurse when you get to Short Stay.   The day of surgery:                     Do not wear jewelry, make up, or nail polish            Do not wear lotions, powders, perfumes, or deodorant.            Do not shave 48 hours prior to surgery.              Do not bring valuables to the hospital.            [facility] is not responsible for any belongings or valuables.  Do NOT Smoke (Tobacco/Vaping) 24 hours prior to your procedure If you use a CPAP at night, you may bring all equipment for your overnight stay.   Contacts, glasses, dentures or bridgework may not be worn into surgery.      For patients admitted to the hospital, discharge time will be determined by your treatment team.   Patients discharged the day of surgery will not be allowed  to drive home, and someone needs to stay with them for 24 hours.    Special instructions:   [place]- Preparing For Surgery  Before surgery, you can play an important role. Because skin is not sterile, your skin needs to be as free of germs as possible. You can reduce the number of germs on your skin by washing with CHG (chlorahexidine gluconate) Soap before surgery.  CHG is an antiseptic cleaner which kills germs and bonds with the skin to continue killing germs even after washing.    Oral Hygiene is also important to reduce your risk of infection.  Remember - BRUSH YOUR TEETH THE MORNING OF SURGERY WITH YOUR REGULAR TOOTHPASTE  Please do not use if you have an allergy to CHG or antibacterial soaps. If your skin becomes reddened/irritated stop using the CHG.  Do not shave (including legs and underarms) for at least 48 hours prior  to first CHG shower. It is OK to shave your face.  Please follow these instructions carefully.   Shower the NIGHT BEFORE SURGERY and the MORNING OF SURGERY with DIAL Soap.   Pat yourself dry with a CLEAN TOWEL.  Wear CLEAN PAJAMAS to bed the night before surgery  Place CLEAN SHEETS on your bed the night of your first shower and DO NOT SLEEP WITH PETS.   Day of Surgery: Please shower morning of surgery  Wear Clean/Comfortable clothing the morning of surgery Do not apply any deodorants/lotions.   Remember to brush your teeth WITH YOUR REGULAR TOOTHPASTE.   Questions were answered. Patient verbalized understanding of instructions.

## 2022-07-21 ENCOUNTER — Ambulatory Visit (HOSPITAL_COMMUNITY): Payer: Medicare Other

## 2022-07-21 ENCOUNTER — Ambulatory Visit (HOSPITAL_COMMUNITY): Payer: Medicare Other | Admitting: Anesthesiology

## 2022-07-21 ENCOUNTER — Other Ambulatory Visit: Payer: Self-pay

## 2022-07-21 ENCOUNTER — Inpatient Hospital Stay: Payer: Medicare Other

## 2022-07-21 ENCOUNTER — Encounter (HOSPITAL_COMMUNITY)
Admission: RE | Disposition: A | Discharge status: Home / Self Care | Payer: Self-pay | Source: Home / Self Care | Attending: Surgery

## 2022-07-21 ENCOUNTER — Ambulatory Visit (HOSPITAL_COMMUNITY)
Admission: RE | Admit: 2022-07-21 | Discharge: 2022-07-21 | Disposition: A | Discharge status: Home / Self Care | Payer: Medicare Other | Attending: Surgery | Admitting: Surgery

## 2022-07-21 ENCOUNTER — Ambulatory Visit (HOSPITAL_BASED_OUTPATIENT_CLINIC_OR_DEPARTMENT_OTHER): Payer: Medicare Other | Admitting: Anesthesiology

## 2022-07-21 ENCOUNTER — Encounter (HOSPITAL_COMMUNITY): Payer: Self-pay | Admitting: Surgery

## 2022-07-21 ENCOUNTER — Ambulatory Visit (HOSPITAL_COMMUNITY)
Admission: RE | Admit: 2022-07-21 | Discharge: 2022-07-21 | Disposition: A | Discharge status: Home / Self Care | Payer: Medicare Other | Source: Ambulatory Visit | Attending: Surgery | Admitting: Surgery

## 2022-07-21 ENCOUNTER — Inpatient Hospital Stay: Payer: Medicare Other | Admitting: Adult Health

## 2022-07-21 DIAGNOSIS — Z7984 Long term (current) use of oral hypoglycemic drugs: Secondary | ICD-10-CM

## 2022-07-21 DIAGNOSIS — R599 Enlarged lymph nodes, unspecified: Secondary | ICD-10-CM | POA: Diagnosis not present

## 2022-07-21 DIAGNOSIS — I10 Essential (primary) hypertension: Secondary | ICD-10-CM | POA: Insufficient documentation

## 2022-07-21 DIAGNOSIS — C50412 Malignant neoplasm of upper-outer quadrant of left female breast: Secondary | ICD-10-CM | POA: Diagnosis not present

## 2022-07-21 DIAGNOSIS — Z79899 Other long term (current) drug therapy: Secondary | ICD-10-CM | POA: Diagnosis not present

## 2022-07-21 DIAGNOSIS — Z452 Encounter for adjustment and management of vascular access device: Secondary | ICD-10-CM | POA: Diagnosis not present

## 2022-07-21 DIAGNOSIS — C50912 Malignant neoplasm of unspecified site of left female breast: Secondary | ICD-10-CM | POA: Diagnosis not present

## 2022-07-21 DIAGNOSIS — K219 Gastro-esophageal reflux disease without esophagitis: Secondary | ICD-10-CM | POA: Diagnosis not present

## 2022-07-21 DIAGNOSIS — E119 Type 2 diabetes mellitus without complications: Secondary | ICD-10-CM | POA: Diagnosis not present

## 2022-07-21 DIAGNOSIS — N289 Disorder of kidney and ureter, unspecified: Secondary | ICD-10-CM | POA: Diagnosis not present

## 2022-07-21 DIAGNOSIS — M1990 Unspecified osteoarthritis, unspecified site: Secondary | ICD-10-CM | POA: Diagnosis not present

## 2022-07-21 HISTORY — PX: PORTACATH PLACEMENT: SHX2246

## 2022-07-21 HISTORY — PX: SENTINEL NODE BIOPSY: SHX6608

## 2022-07-21 HISTORY — DX: Unspecified osteoarthritis, unspecified site: M19.90

## 2022-07-21 LAB — GLUCOSE, CAPILLARY
Glucose-Capillary: 124 mg/dL — ABNORMAL HIGH (ref 70–99)
Glucose-Capillary: 124 mg/dL — ABNORMAL HIGH (ref 70–99)

## 2022-07-21 LAB — POCT I-STAT, CHEM 8
BUN: 20 mg/dL (ref 8–23)
Calcium, Ion: 1.03 mmol/L — ABNORMAL LOW (ref 1.15–1.40)
Chloride: 103 mmol/L (ref 98–111)
Creatinine, Ser: 1.5 mg/dL — ABNORMAL HIGH (ref 0.44–1.00)
Glucose, Bld: 126 mg/dL — ABNORMAL HIGH (ref 70–99)
HCT: 38 % (ref 36.0–46.0)
Hemoglobin: 12.9 g/dL (ref 12.0–15.0)
Potassium: 4 mmol/L (ref 3.5–5.1)
Sodium: 139 mmol/L (ref 135–145)
TCO2: 27 mmol/L (ref 22–32)

## 2022-07-21 SURGERY — INSERTION, TUNNELED CENTRAL VENOUS DEVICE, WITH PORT
Anesthesia: Regional | Site: Axilla

## 2022-07-21 MED ORDER — ACETAMINOPHEN 500 MG PO TABS
1000.0000 mg | ORAL_TABLET | Freq: Once | ORAL | Status: AC
  Administered 2022-07-21: 1000 mg via ORAL
  Filled 2022-07-21: qty 2

## 2022-07-21 MED ORDER — TECHNETIUM TC 99M TILMANOCEPT KIT
1.0000 | PACK | Freq: Once | INTRAVENOUS | Status: AC | PRN
  Administered 2022-07-21: 1 via INTRADERMAL

## 2022-07-21 MED ORDER — OXYCODONE HCL 5 MG PO TABS: 5.0000 mg | ORAL_TABLET | Freq: Four times a day (QID) | ORAL | 0 refills | Status: DC | PRN

## 2022-07-21 MED ORDER — CEFAZOLIN SODIUM-DEXTROSE 2-4 GM/100ML-% IV SOLN
2.0000 g | INTRAVENOUS | Status: AC
  Administered 2022-07-21: 2 g via INTRAVENOUS
  Filled 2022-07-21: qty 100

## 2022-07-21 MED ORDER — FENTANYL CITRATE (PF) 250 MCG/5ML IJ SOLN
INTRAMUSCULAR | Status: AC
  Filled 2022-07-21: qty 5

## 2022-07-21 MED ORDER — HEPARIN SOD (PORK) LOCK FLUSH 100 UNIT/ML IV SOLN
INTRAVENOUS | Status: AC
  Filled 2022-07-21: qty 5

## 2022-07-21 MED ORDER — DEXAMETHASONE SODIUM PHOSPHATE 10 MG/ML IJ SOLN
INTRAMUSCULAR | Status: DC | PRN
  Administered 2022-07-21: 10 mg via INTRAVENOUS

## 2022-07-21 MED ORDER — ONDANSETRON HCL 4 MG/2ML IJ SOLN: 4.0000 mg | Freq: Once | INTRAMUSCULAR | Status: DC | PRN

## 2022-07-21 MED ORDER — FENTANYL CITRATE (PF) 250 MCG/5ML IJ SOLN
INTRAMUSCULAR | Status: DC | PRN
  Administered 2022-07-21: 50 ug via INTRAVENOUS
  Administered 2022-07-21: 100 ug via INTRAVENOUS

## 2022-07-21 MED ORDER — PROPOFOL 10 MG/ML IV BOLUS
INTRAVENOUS | Status: DC | PRN
  Administered 2022-07-21: 140 mg via INTRAVENOUS

## 2022-07-21 MED ORDER — METHYLENE BLUE 1 % INJ SOLN
INTRAVENOUS | Status: AC
  Filled 2022-07-21: qty 10

## 2022-07-21 MED ORDER — BUPIVACAINE-EPINEPHRINE (PF) 0.25% -1:200000 IJ SOLN
INTRAMUSCULAR | Status: AC
  Filled 2022-07-21: qty 30

## 2022-07-21 MED ORDER — OXYCODONE HCL 5 MG PO TABS: 5.0000 mg | ORAL_TABLET | Freq: Once | ORAL | Status: DC | PRN

## 2022-07-21 MED ORDER — ORAL CARE MOUTH RINSE: 15.0000 mL | Freq: Once | OROMUCOSAL | Status: AC

## 2022-07-21 MED ORDER — HEPARIN 6000 UNIT IRRIGATION SOLUTION
Status: AC
  Filled 2022-07-21: qty 500

## 2022-07-21 MED ORDER — CHLORHEXIDINE GLUCONATE CLOTH 2 % EX PADS: 6.0000 | MEDICATED_PAD | Freq: Once | CUTANEOUS | Status: DC

## 2022-07-21 MED ORDER — CHLORHEXIDINE GLUCONATE 0.12 % MT SOLN
15.0000 mL | Freq: Once | OROMUCOSAL | Status: AC
  Administered 2022-07-21: 15 mL via OROMUCOSAL
  Filled 2022-07-21: qty 15

## 2022-07-21 MED ORDER — HEPARIN SOD (PORK) LOCK FLUSH 100 UNIT/ML IV SOLN
INTRAVENOUS | Status: DC | PRN
  Administered 2022-07-21: 500 [IU] via INTRAVENOUS

## 2022-07-21 MED ORDER — LACTATED RINGERS IV SOLN: INTRAVENOUS | Status: DC

## 2022-07-21 MED ORDER — ONDANSETRON HCL 4 MG/2ML IJ SOLN
INTRAMUSCULAR | Status: DC | PRN
  Administered 2022-07-21: 4 mg via INTRAVENOUS

## 2022-07-21 MED ORDER — HEPARIN 6000 UNIT IRRIGATION SOLUTION
Status: DC | PRN
  Administered 2022-07-21: 1

## 2022-07-21 MED ORDER — FENTANYL CITRATE (PF) 100 MCG/2ML IJ SOLN: 25.0000 ug | INTRAMUSCULAR | Status: DC | PRN

## 2022-07-21 MED ORDER — BUPIVACAINE-EPINEPHRINE 0.25% -1:200000 IJ SOLN
INTRAMUSCULAR | Status: DC | PRN
  Administered 2022-07-21: 20 mL

## 2022-07-21 MED ORDER — SUCCINYLCHOLINE CHLORIDE 200 MG/10ML IV SOSY
PREFILLED_SYRINGE | INTRAVENOUS | Status: DC | PRN
  Administered 2022-07-21: 100 mg via INTRAVENOUS

## 2022-07-21 MED ORDER — SODIUM CHLORIDE (PF) 0.9 % IJ SOLN
INTRAMUSCULAR | Status: AC
  Filled 2022-07-21: qty 10

## 2022-07-21 MED ORDER — 0.9 % SODIUM CHLORIDE (POUR BTL) OPTIME
TOPICAL | Status: DC | PRN
  Administered 2022-07-21: 1000 mL

## 2022-07-21 MED ORDER — OXYCODONE HCL 5 MG/5ML PO SOLN: 5.0000 mg | Freq: Once | ORAL | Status: DC | PRN

## 2022-07-21 MED ORDER — LIDOCAINE 2% (20 MG/ML) 5 ML SYRINGE
INTRAMUSCULAR | Status: DC | PRN
  Administered 2022-07-21: 60 mg via INTRAVENOUS

## 2022-07-21 MED ORDER — PHENYLEPHRINE HCL-NACL 20-0.9 MG/250ML-% IV SOLN
INTRAVENOUS | Status: DC | PRN
  Administered 2022-07-21: 30 ug/min via INTRAVENOUS

## 2022-07-21 SURGICAL SUPPLY — 48 items
ADH SKN CLS APL DERMABOND .7 (GAUZE/BANDAGES/DRESSINGS) ×4
APL PRP STRL LF DISP 70% ISPRP (MISCELLANEOUS) ×2
BAG COUNTER SPONGE SURGICOUNT (BAG) ×2 IMPLANT
BAG DECANTER FOR FLEXI CONT (MISCELLANEOUS) ×2 IMPLANT
BAG SPNG CNTER NS LX DISP (BAG) ×2
BIOPATCH RED 1 DISK 7.0 (GAUZE/BANDAGES/DRESSINGS) IMPLANT
BNDG GAUZE DERMACEA FLUFF 4 (GAUZE/BANDAGES/DRESSINGS) IMPLANT
BNDG GZE DERMACEA 4 6PLY (GAUZE/BANDAGES/DRESSINGS) ×2
CHLORAPREP W/TINT 26 (MISCELLANEOUS) ×2 IMPLANT
COVER SURGICAL LIGHT HANDLE (MISCELLANEOUS) ×2 IMPLANT
COVER TRANSDUCER ULTRASND GEL (DISPOSABLE) ×2 IMPLANT
DERMABOND ADVANCED .7 DNX12 (GAUZE/BANDAGES/DRESSINGS) ×2 IMPLANT
DRAIN CHANNEL 15F RND FF W/TCR (WOUND CARE) IMPLANT
DRAPE C-ARM 42X120 X-RAY (DRAPES) ×2 IMPLANT
DRSG TEGADERM 4X4.75 (GAUZE/BANDAGES/DRESSINGS) IMPLANT
ELECT CAUTERY BLADE 6.4 (BLADE) ×2 IMPLANT
ELECT REM PT RETURN 9FT ADLT (ELECTROSURGICAL) ×2
ELECTRODE REM PT RTRN 9FT ADLT (ELECTROSURGICAL) ×2 IMPLANT
EVACUATOR SILICONE 100CC (DRAIN) IMPLANT
GAUZE 4X4 16PLY ~~LOC~~+RFID DBL (SPONGE) ×2 IMPLANT
GAUZE SPONGE 4X4 12PLY STRL (GAUZE/BANDAGES/DRESSINGS) IMPLANT
GEL ULTRASOUND 20GR AQUASONIC (MISCELLANEOUS) IMPLANT
GLOVE BIO SURGEON STRL SZ8 (GLOVE) ×2 IMPLANT
GLOVE BIOGEL PI IND STRL 8 (GLOVE) ×2 IMPLANT
GOWN STRL REUS W/ TWL LRG LVL3 (GOWN DISPOSABLE) ×2 IMPLANT
GOWN STRL REUS W/ TWL XL LVL3 (GOWN DISPOSABLE) ×2 IMPLANT
GOWN STRL REUS W/TWL LRG LVL3 (GOWN DISPOSABLE) ×2
GOWN STRL REUS W/TWL XL LVL3 (GOWN DISPOSABLE) ×2
INTRODUCER COOK 11FR (CATHETERS) IMPLANT
KIT BASIN OR (CUSTOM PROCEDURE TRAY) ×2 IMPLANT
KIT PORT POWER 8FR ISP CVUE (Port) IMPLANT
KIT TURNOVER KIT B (KITS) ×2 IMPLANT
NS IRRIG 1000ML POUR BTL (IV SOLUTION) ×2 IMPLANT
PAD ARMBOARD 7.5X6 YLW CONV (MISCELLANEOUS) ×2 IMPLANT
PENCIL BUTTON HOLSTER BLD 10FT (ELECTRODE) ×2 IMPLANT
PENCIL SMOKE EVACUATOR (MISCELLANEOUS) IMPLANT
POSITIONER HEAD DONUT 9IN (MISCELLANEOUS) ×2 IMPLANT
SET INTRODUCER 12FR PACEMAKER (INTRODUCER) IMPLANT
SET SHEATH INTRODUCER 10FR (MISCELLANEOUS) IMPLANT
SHEATH COOK PEEL AWAY SET 9F (SHEATH) IMPLANT
SUT MNCRL AB 4-0 PS2 18 (SUTURE) ×2 IMPLANT
SUT PROLENE 2 0 SH 30 (SUTURE) ×2 IMPLANT
SUT VIC AB 3-0 SH 27 (SUTURE) ×2
SUT VIC AB 3-0 SH 27X BRD (SUTURE) ×2 IMPLANT
SYR 5ML LUER SLIP (SYRINGE) ×2 IMPLANT
TOWEL GREEN STERILE (TOWEL DISPOSABLE) ×2 IMPLANT
TOWEL GREEN STERILE FF (TOWEL DISPOSABLE) ×2 IMPLANT
TRAY LAPAROSCOPIC MC (CUSTOM PROCEDURE TRAY) ×2 IMPLANT

## 2022-07-21 NOTE — Anesthesia Preprocedure Evaluation (Addendum)
Anesthesia Evaluation  Patient identified by MRN, date of birth, ID band Patient awake    Reviewed: Allergy & Precautions, H&P , NPO status , Patient's Chart, lab work & pertinent test results  Airway Mallampati: II  TM Distance: >3 FB Neck ROM: Full    Dental no notable dental hx.    Pulmonary neg pulmonary ROS   Pulmonary exam normal breath sounds clear to auscultation       Cardiovascular hypertension, Pt. on medications Normal cardiovascular exam Rhythm:Regular Rate:Normal     Neuro/Psych negative neurological ROS  negative psych ROS   GI/Hepatic Neg liver ROS,GERD  Controlled and Medicated,,  Endo/Other  diabetes, Well Controlled, Type 2, Oral Hypoglycemic Agents    Renal/GU Renal InsufficiencyRenal disease  negative genitourinary   Musculoskeletal  (+) Arthritis , Osteoarthritis,    Abdominal   Peds negative pediatric ROS (+)  Hematology negative hematology ROS (+)   Anesthesia Other Findings L breast ca   Reproductive/Obstetrics negative OB ROS                             Anesthesia Physical Anesthesia Plan  ASA: 3  Anesthesia Plan: General and Regional   Post-op Pain Management: Regional block* and Tylenol PO (pre-op)*   Induction: Intravenous and Rapid sequence  PONV Risk Score and Plan: 3 and Ondansetron, Dexamethasone and Treatment may vary due to age or medical condition  Airway Management Planned: Oral ETT  Additional Equipment: None  Intra-op Plan:   Post-operative Plan: Extubation in OR  Informed Consent:   Plan Discussed with: CRNA and Surgeon  Anesthesia Plan Comments: (Ozempic - last day - 07/18/22- will need ETT/RSI for procedure)        Anesthesia Quick Evaluation

## 2022-07-21 NOTE — Discharge Instructions (Signed)
    PORT-A-CATH: POST OP INSTRUCTIONS  Always review your discharge instruction sheet given to you by the facility where your surgery was performed.   A prescription for pain medication may be given to you upon discharge. Take your pain medication as prescribed, if needed. If narcotic pain medicine is not needed, then you make take acetaminophen (Tylenol) or ibuprofen (Advil) as needed.  Take your usually prescribed medications unless otherwise directed. If you need a refill on your pain medication, please contact our office. All narcotic pain medicine now requires a paper prescription.  Phoned in and fax refills are no longer allowed by law.  Prescriptions will not be filled after 5 pm or on weekends.  You should follow a light diet for the remainder of the day after your procedure. Most patients will experience some mild swelling and/or bruising in the area of the incision. It may take several days to resolve. It is common to experience some constipation if taking pain medication after surgery. Increasing fluid intake and taking a stool softener (such as Colace) will usually help or prevent this problem from occurring. A mild laxative (Milk of Magnesia or Miralax) should be taken according to package directions if there are no bowel movements after 48 hours.  Unless discharge instructions indicate otherwise, you may remove your bandages 48 hours after surgery, and you may shower at that time. You may have steri-strips (small white skin tapes) in place directly over the incision.  These strips should be left on the skin for 7-10 days.  If your surgeon used Dermabond (skin glue) on the incision, you may shower in 24 hours.  The glue will flake off over the next 2-3 weeks.  If your port is left accessed at the end of surgery (needle left in port), the dressing cannot get wet and should only by changed by a healthcare professional. When the port is no longer accessed (when the needle has been removed),  follow step 7.   ACTIVITIES:  Limit activity involving your arms for the next 72 hours. Do no strenuous exercise or activity for 1 week. You may drive when you are no longer taking prescription pain medication, you can comfortably wear a seatbelt, and you can maneuver your car. 10.You may need to see your doctor in the office for a follow-up appointment.  Please       check with your doctor.  11.When you receive a new Port-a-Cath, you will get a product guide and        ID card.  Please keep them in case you need them.  WHEN TO CALL YOUR DOCTOR [phone]): Fever over 101.0 Chills Continued bleeding from incision Increased redness and tenderness at the site Shortness of breath, difficulty breathing   The clinic staff is available to answer your questions during regular business hours. Please don't hesitate to call and ask to speak to one of the nurses or medical assistants for clinical concerns. If you have a medical emergency, go to the nearest emergency room or call 911.  A surgeon from [facility] is always on call at the hospital.     For further information, please visit [email or web address]         Cont wet to dry dressing to open area as before

## 2022-07-21 NOTE — Interval H&P Note (Signed)
History and Physical Interval Note:  07/21/2022 12:32 PM  Nicole Nunez  has presented today for surgery, with the diagnosis of LEFT BREAST CANCER.  The various methods of treatment have been discussed with the patient and family. After consideration of risks, benefits and other options for treatment, the patient has consented to  Procedure(s) with comments: INSERTION PORT-A-CATH (N/A) - 90 MIN ROOM 4-OK'D PER KELLY LEFT SENTINEL NODE BIOPSY (Left) as a surgical intervention.  The patient's history has been reviewed, patient examined, no change in status, stable for surgery.  I have reviewed the patient's chart and labs.  Questions were answered to the patient's satisfaction.     [place]

## 2022-07-21 NOTE — Op Note (Addendum)
preoperative diagnosis: PAC needed/ left breast cancer   Postoperative diagnosis: Same  Procedure: Portacath Placement with c arm and U/S guidance and left axillary deep sentinel lymph node mapping with Tc sulfa colloid.  Surgeon: Turner Daniels, MD, FACS  Anesthesia: General and 0.25 % marcaine with epinephrine  Clinical History and Indications: The patient is getting ready to begin chemotherapy for her cancer. She  needs a Port-A-Cath for venous access.  She also needs  a SLN mapping due to failed  initial attempt and need to help dictate  future care.  Risk of bleeding, infection,  Collapse lung,  Death,  DVT,  Organ injury,  Mediastinal injury,  Injury to heart,  Injury to blood vessels,  Nerves,  Migration of catheter,   lymphedema, Embolization of catheter and the need for more surgery.  Description of Procedure: I have seen the patient in the holding area and confirmed the plans for the procedure as noted above. I reviewed the risks and complications again and the patient has no further questions. She wishes to proceed.   The patient was then taken to the operating room. After satisfactory general  anesthesia had been obtained the upper chest and lower neck were prepped and draped as a sterile field. The timeout was done.  The right internal jugular vein  was entered under U/S guidance  and the guidewire threaded into the superior vena cava right atrial area under fluoroscopic guidance. An incision was then made on the anterior chest wall and a subcutaneous pocket fashioned for the port reservoir.  The port tubing was then brought through a subcutaneous tunnel from the port site to the guidewire site.  The port and catheter were attached, locked  and flushed. The catheter was measured and cut to appropriate length.The dilator and peel-away sheath were then advanced over the guidewire while monitoring this with fluoroscopy. The guidewire and dilator were removed and the tubing threaded to  approximately 22 cm. The peel-away sheath was then removed. The catheter aspirated and flushed easily. Using fluoroscopy the tip was in the superior vena cava right atrial junction area. It aspirated and flushed easily. That aspirated and flushed easily.  The reservoir was secured to the fascia with 1 sutures of 2-0 Prolene. A final check with fluoroscopy was done to make sure we had no kinks and good positioning of the tip of the catheter. Everything appeared to be okay. The catheter was aspirated, flushed with dilute heparin and then concentrated aqueous heparin.  The incision was then closed with interrupted 3-0 Vicryl, and 4-0 Monocryl subcuticular with Dermabond on the skin.    We next opened the previous left axillary incision.  The neoprobe was used and a single deep  sentinel node  was identified and removed.  The posterior wound was open from previous attempt at SLN mapping.  The long thoracic nerve , axillary vein and thoracodorsal trunk was identified. The uptake of the tracer was poor but this was the strongest signal. No other  significant uptake  noted. A drain was placed  15 F through a separate stab incision. Hemostasis achieved and wound closed with 3 O vicryl and 4 0 monocryl. The open wound was packed and Dermabond and clean gauze applied.  All counts found to be correct.  The patient was taken to PACU in stable conditioin. All counts correct.  There were no operative complications. Estimated blood loss was minimal. All counts were correct. The patient tolerated the procedure well.  Turner Daniels, MD,  FACS

## 2022-07-21 NOTE — Anesthesia Procedure Notes (Signed)
Procedure Name: Intubation Date/Time: 07/21/2022 1:14 PM  Performed by: Darletta Moll, CRNAPre-anesthesia Checklist: Patient identified, Emergency Drugs available, Suction available and Patient being monitored Patient Re-evaluated:Patient Re-evaluated prior to induction Oxygen Delivery Method: Circle system utilized Preoxygenation: Pre-oxygenation with 100% oxygen Induction Type: IV induction, Rapid sequence and Cricoid Pressure applied Laryngoscope Size: Mac Grade View: Grade I Tube type: Oral Tube size: 7.0 mm Number of attempts: 1 Airway Equipment and Method: Stylet and Oral airway Placement Confirmation: ETT inserted through vocal cords under direct vision, positive ETCO2 and breath sounds checked- equal and bilateral Secured at: 21 cm Tube secured with: Tape Dental Injury: Teeth and Oropharynx as per pre-operative assessment

## 2022-07-21 NOTE — Anesthesia Postprocedure Evaluation (Signed)
Anesthesia Post Note  Patient: Nicole Nunez  Procedure(s) Performed: INSERTION PORT-A-CATH LEFT SENTINEL NODE BIOPSY (Left: Axilla)     Patient location during evaluation: PACU Anesthesia Type: General Level of consciousness: awake and alert Pain management: pain level controlled Vital Signs Assessment: post-procedure vital signs reviewed and stable Respiratory status: spontaneous breathing, nonlabored ventilation, respiratory function stable and patient connected to nasal cannula oxygen Cardiovascular status: blood pressure returned to baseline and stable Postop Assessment: no apparent nausea or vomiting Anesthetic complications: no  No notable events documented.  Last Vitals:  Vitals:   07/21/22 1500 07/21/22 1515  BP: 135/70 133/74  Pulse: 80 78  Resp: 13 17  Temp:    SpO2: 97% 98%    Last Pain:  Vitals:   07/21/22 1456  TempSrc:   PainSc: 0-No pain                 Armin Yerger S

## 2022-07-21 NOTE — Transfer of Care (Signed)
Immediate Anesthesia Transfer of Care Note  Patient: Nicole Nunez  Procedure(s) Performed: INSERTION PORT-A-CATH LEFT SENTINEL NODE BIOPSY (Left: Axilla)  Patient Location: PACU  Anesthesia Type:General  Level of Consciousness: drowsy and patient cooperative  Airway & Oxygen Therapy: Patient Spontanous Breathing  Post-op Assessment: Report given to RN, Post -op Vital signs reviewed and stable, and Patient moving all extremities X 4  Post vital signs: Reviewed and stable  Last Vitals:  Vitals Value Taken Time  BP 137/70 07/21/22 1456  Temp 36.4 C 07/21/22 1456  Pulse 83 07/21/22 1458  Resp 16 07/21/22 1458  SpO2 98 % 07/21/22 1458  Vitals shown include unvalidated device data.  Last Pain:  Vitals:   07/21/22 1217  TempSrc:   PainSc: 0-No pain      Patients Stated Pain Goal: 0 ([id number])  Complications: No notable events documented.

## 2022-07-21 NOTE — H&P (Signed)
History of Present Illness: Nicole Nunez is a 77 y.o. female who is seen today for postop visit after left breast lumpectomy with left axillary sentinel node mapping for stage I left breast cancer. Unfortunately, pathology revealed no lymph nodes in the tissue submitted. We discussed this today. She also has some separation after Atarax incision. This is being packed. She is in need of a Port-A-Cath for chemotherapy due to high Oncotype score..    Review of Systems: A complete review of systems was obtained from the patient. I have reviewed this information and discussed as appropriate with the patient. See HPI as well for other ROS.    Medical History: Past Medical History:  Diagnosis Date  Diabetes mellitus without complication (CMS-HCC)  History of cancer   There is no problem list on file for this patient.  Past Surgical History:  Procedure Laterality Date  CATARACT EXTRACTION    Allergies  Allergen Reactions  Statins-Hmg-Coa Reductase Inhibitors Other (See Comments)  Rosuvastatin, atorvastatin, pravastatin, simvastatin   Current Outpatient Medications on File Prior to Visit  Medication Sig Dispense Refill  dapagliflozin propanediol (FARXIGA) 10 mg tablet Take 1 tablet by mouth once daily  olmesartan (BENICAR) 20 MG tablet Take 1 tablet by mouth once daily  OZEMPIC 0.25 mg or 0.5 mg (2 mg/3 mL) pen injector Inject 0.'5mg'$  SQ weekly.  pantoprazole (PROTONIX) 40 MG DR tablet Take 1 tablet by mouth once daily  pitavastatin magnesium 4 mg Tab Take by mouth   No current facility-administered medications on file prior to visit.   Family History  Problem Relation Age of Onset  Breast cancer Sister    Social History   Tobacco Use  Smoking Status Never  Smokeless Tobacco Never    Social History   Socioeconomic History  Marital status: Widowed  Tobacco Use  Smoking status: Never  Smokeless tobacco: Never  Vaping Use  Vaping Use: Never used   Objective:    Vitals:  07/18/22 1325  PainSc: 0-No pain   There is no height or weight on file to calculate BMI.  Left ax incision with a 2 cm opening in the posterior aspect which is clean and packed. There is a small area of ulceration on the inner aspect of the left arm. The breast incisions healing well. No signs of infection.  Labs, Imaging and Diagnostic Testing:  FINAL MICROSCOPIC DIAGNOSIS:  A. BREAST W/ RADIOACTIVE SEED, LEFT, LUMPECTOMY: - Invasive ductal carcinoma (NOS)/invasive mammary carcinoma (NST) - 15 mm in greatest dimension. - Overall Nottingham histologic score III/III (tubular score 3/3; nuclear pleomorphism 2/3; mitotic rate 3/3) - Margins negative: 3 mm, superior; 5 mm, anterior; 8 mm lateral; all others greater than 10 mm pT1c pNX; see synoptic report and gross description for further information  B. LYMPH NODE, LEFT AXILLARY SENTINEL, EXCISION: - Benign adipose tissue.  Note: The specimen is submitted in its entirety and consists of mature adipose tissue with no lymph nodes identified.  ONCOLOGY TABLE:  INVASIVE CARCINOMA OF THE BREAST: Resection  Procedure: Lumpectomy Specimen Laterality: Left Histologic Type: Invasive ductal carcinoma (NOS)/invasive mammary carcinoma (NST). Histologic Grade: Glandular (Acinar)/Tubular Differentiation: 3/3 Nuclear Pleomorphism: 2/3 Mitotic Rate: 3/3 Overall Grade: III/III Tumor Size: 15 x 15 x 13 mm Ductal Carcinoma In Situ: Not identified Lymphatic and/or Vascular Invasion: Not identified Treatment Effect in the Breast: No known presurgical therapy Margins: All margins negative Distance from Closest Margin (mm): 3 mm, superior; 5 mm, anterior; 8 mm, lateral; all others greater than 10 mm Specify Closest Margin (  required only if <53m): See above DCIS Margins: N/A Distance from Closest Margin (mm): N/A Specify Closest Margin (required only if <162m: N/A Regional Lymph Nodes N/A, no lymph nodes found (a large  portion of lobulated fatty tissue is received the specimen is submitted in its entirety with no lymph nodes identified. Number of Lymph Nodes Examined: 0 Number of Sentinel Nodes Examined: 0 Number of Lymph Nodes with Macrometastases (>2 mm): N/A Number of Lymph Nodes with Micrometastases: N/A Number of Lymph Nodes with Isolated Tumor Cells (=0.2 mm or =200 cells): N/A Size of Largest Metastatic Deposit (mm): N/A Extranodal Extension: N/A Distant Metastasis: Distant Site(s) Involved: N/A Breast Biomarker Testing Performed on Previous Biopsy: Testing Performed on Case Number: [id number] Estrogen Receptor: Positive, 90% weak to moderate Progesterone Receptor: Positive, 15% weak to strong HER2: Negative by FISH Ki-67: 50% Pathologic Stage Classification (pTNM, AJCC 8th Edition): pT1c, pNX Representative Tumor Block: A1 Comment(s): None (v4.5.0.0)  Assessment and Plan:   Diagnoses and all orders for this visit:  S/P lumpectomy of breast    Discussed findings. She did not map out the first time but I discussed potentially remapping or using nuclear medicine technetium agent and methylene blue dye. She is in need of a Port-A-Cath for chemotherapy and they can be placed at the same time. Will plan on placement of Mediport for chemotherapy as well as attempting a sentinel lymph node mapping procedure. Risks and benefits as well as the rationale for doing so were discussed. This was discussed at the multidisciplinary meeting and it was felt the standpoint radiation oncology that no status would be helpful to help delineate her treatment. She does need chemotherapy. I reviewed this with her today and she understands the above.. Risk of bleeding, infection, collapsed lung, injury to internal organ, pneumothorax, hemothorax, and lymphedema as well as nerve blood vessel injury was reviewed with the patient today.  No follow-ups on file.  THKennieth FrancoisMD

## 2022-07-22 ENCOUNTER — Encounter (HOSPITAL_COMMUNITY): Payer: Self-pay | Admitting: Surgery

## 2022-07-22 ENCOUNTER — Inpatient Hospital Stay: Payer: Medicare Other

## 2022-07-25 ENCOUNTER — Telehealth: Payer: Self-pay | Admitting: *Deleted

## 2022-07-25 ENCOUNTER — Inpatient Hospital Stay: Payer: Medicare Other

## 2022-07-25 ENCOUNTER — Encounter: Payer: Self-pay | Admitting: Surgery

## 2022-07-25 LAB — SURGICAL PATHOLOGY

## 2022-07-25 NOTE — Telephone Encounter (Signed)
DCP-001: Use of a Clinical Trial Screening Tool to Address Cancer Health Disparities in the [facility])   The research nurse called and spoke to the Nicole Nunez about this one-time data collection study.  The Nicole Nunez was informed that since she was recently referred for a NCI study, then we are required to introduce this study.  The Nicole Nunez was reminded that Dr. Lindi Adie referred her to the S2205 (device trial), but she was not eligible for participation. This is a one time collection of information to better understand the clinical trial participation process.  There is no direct benefit to the Nicole Nunez for participating in this study; however, the information gathered will help researchers better understand why people do not participate in clinical trials and develop ways to increase participation.  The Nicole Nunez was in agreement to have the DCP-001 study consent form and HIPAA form emailed to her.  The Nicole Nunez confirmed her email address.  The Nicole Nunez said that she will read through the documents this week.  The research nurse and the Nicole Nunez agreed to talk on Friday, 3/8 to discuss the study and her participation.  The Nicole Nunez was told that if she is interested in participation then she can verbally consent over the phone.  The Nicole Nunez was also informed that her participation is completely voluntary.  The Nicole Nunez was thanked for her time.   Brion Aliment RN, BSN, CCRP Clinical Research Nurse Lead 07/25/2022 3:59 PM

## 2022-07-29 ENCOUNTER — Inpatient Hospital Stay: Payer: Medicare Other | Admitting: Adult Health

## 2022-07-29 ENCOUNTER — Inpatient Hospital Stay: Payer: Medicare Other

## 2022-07-29 ENCOUNTER — Telehealth: Payer: Self-pay | Admitting: *Deleted

## 2022-07-29 NOTE — Telephone Encounter (Signed)
DCP-001: Use of a Clinical Trial Screening Tool to Address Cancer Health Disparities in the [place] [facility])   This research nurse called the pt to see if she wanted to participate in the above study.  The pt said that she read over the consent form and has decided to not participate in the one-time data collection study.  The pt was thanked for her time and consideration of this study.  Brion Aliment RN, BSN, CCRP Clinical Research Nurse Lead 07/29/2022 2:24 PM

## 2022-07-30 ENCOUNTER — Other Ambulatory Visit: Payer: Self-pay | Admitting: Internal Medicine

## 2022-08-01 ENCOUNTER — Telehealth: Payer: Self-pay

## 2022-08-01 NOTE — Progress Notes (Cosign Needed)
Refaxed 2024 re-enrollment application to [employer] for [employer]  with updated dates, no information missing on application.    [name], [place] Pharmacist Assistant [phone]

## 2022-08-02 DIAGNOSIS — S90122A Contusion of left lesser toe(s) without damage to nail, initial encounter: Secondary | ICD-10-CM | POA: Diagnosis not present

## 2022-08-02 DIAGNOSIS — M2142 Flat foot [pes planus] (acquired), left foot: Secondary | ICD-10-CM | POA: Diagnosis not present

## 2022-08-02 DIAGNOSIS — L84 Corns and callosities: Secondary | ICD-10-CM | POA: Diagnosis not present

## 2022-08-02 DIAGNOSIS — B351 Tinea unguium: Secondary | ICD-10-CM | POA: Diagnosis not present

## 2022-08-02 DIAGNOSIS — E118 Type 2 diabetes mellitus with unspecified complications: Secondary | ICD-10-CM | POA: Diagnosis not present

## 2022-08-02 NOTE — Progress Notes (Signed)
Patient Care Team: Glendale Chard, MD as PCP - General (Internal Medicine) Mayford Knife, [facility] (Pharmacist) Nicholas Lose, MD as Consulting Physician (Hematology and Oncology) Rockwell Germany, RN as Oncology Nurse Navigator Mauro Kaufmann, RN as Oncology Nurse Navigator Erroll Luna, MD as Consulting Physician (General Surgery) Kyung Rudd, MD as Consulting Physician (Radiation Oncology)  DIAGNOSIS:  Encounter Diagnosis  Name Primary?   Malignant neoplasm of lower-outer quadrant of left breast of female, estrogen receptor positive ([place]) Yes    SUMMARY OF ONCOLOGIC HISTORY: Oncology History  Malignant neoplasm of lower-outer quadrant of left breast of female, estrogen receptor positive ([place])  05/18/2022 Initial Diagnosis   05/18/2022: Screening mammogram detected left breast mass by ultrasound it measured 9 mm, biopsy revealed grade 2 IDC ER 90%, PR 15%, Ki-67 50%, HER2 2+ by IHC, FISH negative ratio 2.17, Copy #3.9, additional 20 cells were examined ratio came at 2.19 and copy #3.72   06/05/2022 Cancer Staging   Staging form: Breast, AJCC 8th Edition - Clinical: Stage IA (cT1b, cN0, cM0, G2, ER+, PR+, HER2-) - Signed by Hayden Pedro, PA-C on 06/05/2022 Method of lymph node assessment: Clinical Histologic grading system: 3 grade system   06/29/2022 Cancer Staging   Staging form: Breast, AJCC 8th Edition - Pathologic: Stage IA (pT1c, pN0, cM0, G3, ER+, PR+, HER2-) - Signed by Nicholas Lose, MD on 06/29/2022 Histologic grading system: 3 grade system   08/09/2022 -  Chemotherapy   Patient is on Treatment Plan : BREAST TC q21d       CHIEF COMPLIANT: Cycle 1 Taxotere and Cytoxan  INTERVAL HISTORY: Nicole Nunez is a 77 y.o. female is here because of above-mentioned history of left breast cancer she underwent lumpectomy and apparently prognostic panel came back as triple negative disease.  She is here today to receive her first cycle of chemotherapy with Taxotere  and Cytoxan.   ALLERGIES:  is allergic to statins.  MEDICATIONS:  Current Outpatient Medications  Medication Sig Dispense Refill   Ascorbic Acid (VITAMIN C) 1000 MG tablet Take 1,000 mg by mouth daily.     aspirin EC 81 MG tablet Take 81 mg by mouth daily.     b complex vitamins tablet Take 1 tablet by mouth daily.     Calcium Carbonate (CALCIUM 600 PO) Take 600 mg by mouth daily.     Cholecalciferol (VITAMIN D3) 125 MCG (5000 UT) CAPS Take 5,000 Units by mouth daily.     dapagliflozin propanediol (FARXIGA) 10 MG TABS tablet Take 1 tablet (10 mg total) by mouth daily. 90 tablet 2   Lancet Devices (ONE TOUCH DELICA LANCING DEV) MISC USE AS DIRECTED TO CHECK BLOOD SUGARS ONCE DAILY dx: e11.22 1 each 2   lidocaine-prilocaine (EMLA) cream Apply to affected area once 30 g 3   loratadine (CLARITIN) 10 MG tablet Take 1 tablet (10 mg total) by mouth daily. 30 tablet 2   magnesium gluconate (MAGONATE) 500 MG tablet Take 500 mg by mouth daily.     Multiple Vitamin (MULTIVITAMIN) tablet Take 1 tablet by mouth daily.     olmesartan (BENICAR) 20 MG tablet Take 1 tablet (20 mg total) by mouth daily. 100 tablet 2   ondansetron (ZOFRAN) 8 MG tablet Take 1 tablet (8 mg total) by mouth every 8 (eight) hours as needed for nausea or vomiting. Start on the third day after chemotherapy. 30 tablet 1   ONETOUCH VERIO test strip USE TO CHECK BLOOD SUGAR DAILY 100 strip 2  oxyCODONE (OXY IR/ROXICODONE) 5 MG immediate release tablet Take 1 tablet (5 mg total) by mouth every 6 (six) hours as needed for severe pain. 15 tablet 0   oxyCODONE (OXY IR/ROXICODONE) 5 MG immediate release tablet Take 1 tablet (5 mg total) by mouth every 6 (six) hours as needed for severe pain. 15 tablet 0   pantoprazole (PROTONIX) 40 MG tablet Take 1 tablet (40 mg total) by mouth daily. 100 tablet 2   Pitavastatin Magnesium 4 MG TABS Take 1 tablet by mouth daily. 30 tablet 11   prochlorperazine (COMPAZINE) 10 MG tablet Take 1 tablet (10 mg  total) by mouth every 6 (six) hours as needed for nausea or vomiting. 30 tablet 1   Semaglutide,0.25 or 0.5MG /DOS, (OZEMPIC, 0.25 OR 0.5 MG/DOSE,) 2 MG/3ML SOPN Inject 0.5mg  SQ weekly. 3 mL 0   Sodium Chloride-Xylitol (XLEAR SINUS CARE SPRAY NA) Place 1 spray into the nose daily as needed (Congestion).     zinc gluconate 50 MG tablet Take 50 mg by mouth daily.     No current facility-administered medications for this visit.   Facility-Administered Medications Ordered in Other Visits  Medication Dose Route Frequency Provider Last Rate Last Admin   sodium chloride flush (NS) 0.9 % injection 10 mL  10 mL Intracatheter PRN Nicholas Lose, MD   10 mL at 08/09/22 1042    PHYSICAL EXAMINATION: ECOG PERFORMANCE STATUS: 1 - Symptomatic but completely ambulatory  Vitals:   08/09/22 1057  BP: (!) 156/71  Pulse: 85  Resp: 18  Temp: 97.7 F (36.5 C)  SpO2: 100%   Filed Weights   08/09/22 1057  Weight: 148 lb 14.4 oz (67.5 kg)     LABORATORY DATA:  I have reviewed the data as listed    Latest Ref Rng & Units 07/21/2022   12:28 PM 06/15/2022   10:15 AM 02/17/2022   11:47 AM  CMP  Glucose 70 - 99 mg/dL 126  118  107   BUN 8 - 23 mg/dL 20  17  23    Creatinine 0.44 - 1.00 mg/dL 1.50  1.13  1.24   Sodium 135 - 145 mmol/L 139  137  144   Potassium 3.5 - 5.1 mmol/L 4.0  4.3  4.3   Chloride 98 - 111 mmol/L 103  102  104   CO2 22 - 32 mmol/L  27  23   Calcium 8.9 - 10.3 mg/dL  8.9  9.7   Total Protein 6.0 - 8.5 g/dL   7.4   Total Bilirubin 0.0 - 1.2 mg/dL   0.6   Alkaline Phos 44 - 121 IU/L   120   AST 0 - 40 IU/L   28   ALT 0 - 32 IU/L   21     Lab Results  Component Value Date   WBC 6.7 02/17/2022   HGB 12.9 07/21/2022   HCT 38.0 07/21/2022   MCV 84 02/17/2022   PLT 309 02/17/2022   NEUTROABS 3.2 11/11/2008    ASSESSMENT & PLAN:  Malignant neoplasm of lower-outer quadrant of left breast of female, estrogen receptor positive ([place]) 05/18/2022: Screening mammogram detected left  breast mass by ultrasound it measured 9 mm, biopsy revealed grade 2 IDC ER 90%, PR 15%, Ki-67 50%, HER2 2+ by IHC, FISH negative ratio 2.17, Copy #3.9, additional 20 cells were examined ratio came at 2.19 and copy #3.72    06/21/2022: Left lumpectomy: Grade 3 IDC 1.5 cm, margins negative, no lymph node (adipose tissue was in the  sentinel node) ER 90% weak to moderate, PR 15% weak to strong, HER2 negative, Ki-67 50% Repeat prognostic panel: ER 0%, PR 0%, Ki-67 35%, HER2 2+ by IHC FISH negative ratio 1.72 (triple negative breast cancer)   Treatment plan: Oncotype DX testing was canceled because the cancer was triple negative.  Recommend adjuvant chemotherapy with Taxotere and Cytoxan every 3 weeks x 4 Adjuvant radiation therapy followed by Adjuvant antiestrogen therapy -----------------------------------------------------------------------------------------------------------------------------------  Current treatment: Cycle 1 Taxotere and Cytoxan Labs have been reviewed, chemo education completed, chemo consent obtained, antiemetics were reviewed Return to clinic in 1 week for toxicity check    No orders of the defined types were placed in this encounter.  The patient has a good understanding of the overall plan. she agrees with it. she will call with any problems that may develop before the next visit here. Total time spent: 30 mins including face to face time and time spent for planning, charting and co-ordination of care   [name], MD 08/09/22    I Gardiner Coins am acting as a Education administrator for [employer]  I have reviewed the above documentation for accuracy and completeness, and I agree with the above.

## 2022-08-02 NOTE — Progress Notes (Signed)
Pharmacist Chemotherapy Monitoring - Initial Assessment    Anticipated start date: 08/09/2022   The following has been reviewed per standard work regarding the patient's treatment regimen: The patient's diagnosis, treatment plan and drug doses, and organ/hematologic function Lab orders and baseline tests specific to treatment regimen  The treatment plan start date, drug sequencing, and pre-medications Prior authorization status  Patient's documented medication list, including drug-drug interaction screen and prescriptions for anti-emetics and supportive care specific to the treatment regimen The drug concentrations, fluid compatibility, administration routes, and timing of the medications to be used The patient's access for treatment and lifetime cumulative dose history, if applicable  The patient's medication allergies and previous infusion related reactions, if applicable   Changes made to treatment plan:  N/A  Follow up needed:  N/A   Nicole Nunez, [place], 08/02/2022  2:54 PM

## 2022-08-08 ENCOUNTER — Encounter: Payer: Self-pay | Admitting: *Deleted

## 2022-08-08 ENCOUNTER — Telehealth: Payer: Self-pay

## 2022-08-08 ENCOUNTER — Other Ambulatory Visit: Payer: Self-pay | Admitting: Internal Medicine

## 2022-08-08 ENCOUNTER — Encounter: Payer: Self-pay | Admitting: Hematology and Oncology

## 2022-08-08 MED ORDER — DAPAGLIFLOZIN PROPANEDIOL 10 MG PO TABS: 10.0000 mg | ORAL_TABLET | Freq: Every day | ORAL | 2 refills | Status: DC

## 2022-08-08 MED FILL — Dexamethasone Sodium Phosphate Inj 100 MG/10ML: INTRAMUSCULAR | Qty: 1 | Status: AC

## 2022-08-08 NOTE — Progress Notes (Signed)
Called pt to introduce myself as her Arboriculturist and to discuss the [employer].  Pt would like to apply so she will provide her proof of income on 08/09/22.  If approved I will give her an expense sheet and my card for any questions or concerns she may have in the future.

## 2022-08-08 NOTE — Progress Notes (Cosign Needed)
Request sent to clincal team for a 90 DS with 2 refills of Farxiga 10mg  to be sent to [facility]. Patient receives medication through [employer] patient assistance program.  [name], [place] Pharmacist Assistant [phone]

## 2022-08-09 ENCOUNTER — Other Ambulatory Visit: Payer: Medicare Other

## 2022-08-09 ENCOUNTER — Other Ambulatory Visit: Payer: Self-pay

## 2022-08-09 ENCOUNTER — Encounter: Payer: Self-pay | Admitting: Hematology and Oncology

## 2022-08-09 ENCOUNTER — Inpatient Hospital Stay: Payer: Medicare Other

## 2022-08-09 ENCOUNTER — Inpatient Hospital Stay: Payer: Medicare Other | Attending: Hematology and Oncology | Admitting: Hematology and Oncology

## 2022-08-09 VITALS — BP 156/71 | HR 85 | Temp 97.7°F | Resp 18 | Ht <= 58 in | Wt 148.9 lb

## 2022-08-09 VITALS — BP 131/63 | HR 75 | Resp 18

## 2022-08-09 DIAGNOSIS — Z79899 Other long term (current) drug therapy: Secondary | ICD-10-CM | POA: Diagnosis not present

## 2022-08-09 DIAGNOSIS — E1122 Type 2 diabetes mellitus with diabetic chronic kidney disease: Secondary | ICD-10-CM | POA: Insufficient documentation

## 2022-08-09 DIAGNOSIS — N183 Chronic kidney disease, stage 3 unspecified: Secondary | ICD-10-CM | POA: Diagnosis not present

## 2022-08-09 DIAGNOSIS — E7800 Pure hypercholesterolemia, unspecified: Secondary | ICD-10-CM | POA: Diagnosis not present

## 2022-08-09 DIAGNOSIS — Z170 Estrogen receptor positive status [ER+]: Secondary | ICD-10-CM | POA: Insufficient documentation

## 2022-08-09 DIAGNOSIS — E1140 Type 2 diabetes mellitus with diabetic neuropathy, unspecified: Secondary | ICD-10-CM | POA: Insufficient documentation

## 2022-08-09 DIAGNOSIS — Z5112 Encounter for antineoplastic immunotherapy: Secondary | ICD-10-CM | POA: Insufficient documentation

## 2022-08-09 DIAGNOSIS — K219 Gastro-esophageal reflux disease without esophagitis: Secondary | ICD-10-CM | POA: Insufficient documentation

## 2022-08-09 DIAGNOSIS — C50512 Malignant neoplasm of lower-outer quadrant of left female breast: Secondary | ICD-10-CM | POA: Insufficient documentation

## 2022-08-09 DIAGNOSIS — Z7984 Long term (current) use of oral hypoglycemic drugs: Secondary | ICD-10-CM | POA: Insufficient documentation

## 2022-08-09 DIAGNOSIS — Z7982 Long term (current) use of aspirin: Secondary | ICD-10-CM | POA: Insufficient documentation

## 2022-08-09 DIAGNOSIS — I129 Hypertensive chronic kidney disease with stage 1 through stage 4 chronic kidney disease, or unspecified chronic kidney disease: Secondary | ICD-10-CM | POA: Insufficient documentation

## 2022-08-09 DIAGNOSIS — E559 Vitamin D deficiency, unspecified: Secondary | ICD-10-CM | POA: Insufficient documentation

## 2022-08-09 LAB — CBC WITH DIFFERENTIAL (CANCER CENTER ONLY)
Abs Immature Granulocytes: 0.01 10*3/uL (ref 0.00–0.07)
Basophils Absolute: 0 10*3/uL (ref 0.0–0.1)
Basophils Relative: 1 %
Eosinophils Absolute: 0.2 10*3/uL (ref 0.0–0.5)
Eosinophils Relative: 3 %
HCT: 34.2 % — ABNORMAL LOW (ref 36.0–46.0)
Hemoglobin: 11.2 g/dL — ABNORMAL LOW (ref 12.0–15.0)
Immature Granulocytes: 0 %
Lymphocytes Relative: 32 %
Lymphs Abs: 1.7 10*3/uL (ref 0.7–4.0)
MCH: 27.9 pg (ref 26.0–34.0)
MCHC: 32.7 g/dL (ref 30.0–36.0)
MCV: 85.1 fL (ref 80.0–100.0)
Monocytes Absolute: 0.6 10*3/uL (ref 0.1–1.0)
Monocytes Relative: 10 %
Neutro Abs: 2.9 10*3/uL (ref 1.7–7.7)
Neutrophils Relative %: 54 %
Platelet Count: 270 10*3/uL (ref 150–400)
RBC: 4.02 MIL/uL (ref 3.87–5.11)
RDW: 15 % (ref 11.5–15.5)
WBC Count: 5.4 10*3/uL (ref 4.0–10.5)
nRBC: 0 % (ref 0.0–0.2)

## 2022-08-09 LAB — CMP (CANCER CENTER ONLY)
ALT: 16 U/L (ref 0–44)
AST: 23 U/L (ref 15–41)
Albumin: 4 g/dL (ref 3.5–5.0)
Alkaline Phosphatase: 95 U/L (ref 38–126)
Anion gap: 5 (ref 5–15)
BUN: 18 mg/dL (ref 8–23)
CO2: 31 mmol/L (ref 22–32)
Calcium: 9.5 mg/dL (ref 8.9–10.3)
Chloride: 105 mmol/L (ref 98–111)
Creatinine: 1.17 mg/dL — ABNORMAL HIGH (ref 0.44–1.00)
GFR, Estimated: 49 mL/min — ABNORMAL LOW (ref >60)
Glucose, Bld: 108 mg/dL — ABNORMAL HIGH (ref 70–99)
Potassium: 4 mmol/L (ref 3.5–5.1)
Sodium: 141 mmol/L (ref 135–145)
Total Bilirubin: 0.5 mg/dL (ref 0.3–1.2)
Total Protein: 6.7 g/dL (ref 6.5–8.1)

## 2022-08-09 MED ORDER — SODIUM CHLORIDE 0.9 % IV SOLN
10.0000 mg | Freq: Once | INTRAVENOUS | Status: AC
  Administered 2022-08-09: 10 mg via INTRAVENOUS
  Filled 2022-08-09: qty 10

## 2022-08-09 MED ORDER — SODIUM CHLORIDE 0.9 % IV SOLN
500.0000 mg/m2 | Freq: Once | INTRAVENOUS | Status: AC
  Administered 2022-08-09: 820 mg via INTRAVENOUS
  Filled 2022-08-09: qty 41

## 2022-08-09 MED ORDER — SODIUM CHLORIDE 0.9% FLUSH: 3.0000 mL | INTRAVENOUS | Status: DC | PRN

## 2022-08-09 MED ORDER — HEPARIN SOD (PORK) LOCK FLUSH 100 UNIT/ML IV SOLN
500.0000 [IU] | Freq: Once | INTRAVENOUS | Status: AC | PRN
  Administered 2022-08-09: 500 [IU]

## 2022-08-09 MED ORDER — SODIUM CHLORIDE 0.9% FLUSH
10.0000 mL | INTRAVENOUS | Status: DC | PRN
  Administered 2022-08-09 (×2): 10 mL

## 2022-08-09 MED ORDER — PALONOSETRON HCL INJECTION 0.25 MG/5ML
0.2500 mg | Freq: Once | INTRAVENOUS | Status: AC
  Administered 2022-08-09: 0.25 mg via INTRAVENOUS
  Filled 2022-08-09: qty 5

## 2022-08-09 MED ORDER — SODIUM CHLORIDE 0.9 % IV SOLN: Freq: Once | INTRAVENOUS | Status: AC

## 2022-08-09 MED ORDER — DAPAGLIFLOZIN PROPANEDIOL 10 MG PO TABS: 10.0000 mg | ORAL_TABLET | Freq: Every day | ORAL | 2 refills | Status: DC

## 2022-08-09 MED ORDER — SODIUM CHLORIDE 0.9 % IV SOLN
60.0000 mg/m2 | Freq: Once | INTRAVENOUS | Status: AC
  Administered 2022-08-09: 99 mg via INTRAVENOUS
  Filled 2022-08-09: qty 9.9

## 2022-08-09 NOTE — Assessment & Plan Note (Signed)
05/18/2022: Screening mammogram detected left breast mass by ultrasound it measured 9 mm, biopsy revealed grade 2 IDC ER 90%, PR 15%, Ki-67 50%, HER2 2+ by IHC, FISH negative ratio 2.17, Copy #3.9, additional 20 cells were examined ratio came at 2.19 and copy #3.72    06/21/2022: Left lumpectomy: Grade 3 IDC 1.5 cm, margins negative, no lymph node (adipose tissue was in the sentinel node) ER 90% weak to moderate, PR 15% weak to strong, HER2 negative, Ki-67 50% Repeat prognostic panel: ER 0%, PR 0%, Ki-67 35%, HER2 2+ by IHC FISH negative ratio 1.72 (triple negative breast cancer)   Treatment plan: Oncotype DX testing was canceled because the cancer was triple negative.  Recommend adjuvant chemotherapy with Taxotere and Cytoxan every 3 weeks x 4 Adjuvant radiation therapy followed by Adjuvant antiestrogen therapy -----------------------------------------------------------------------------------------------------------------------------------  Current treatment: Cycle 1 Taxotere and Cytoxan Labs have been reviewed, chemo education completed, chemo consent obtained, antiemetics were reviewed Return to clinic in 1 week for toxicity check

## 2022-08-09 NOTE — Progress Notes (Signed)
Pt is approved for the $1000 [employer] grant.  

## 2022-08-09 NOTE — Patient Instructions (Signed)
[facility]   Discharge Instructions: Thank you for choosing [facility] to provide your oncology and hematology care.   If you have a lab appointment with the Cancer Center, please go directly to the Cancer Center and check in at the registration area.   Wear comfortable clothing and clothing appropriate for easy access to any Portacath or PICC line.   We strive to give you quality time with your provider. You may need to reschedule your appointment if you arrive late (15 or more minutes).  Arriving late affects you and other patients whose appointments are after yours.  Also, if you miss three or more appointments without notifying the office, you may be dismissed from the clinic at the provider's discretion.      For prescription refill requests, have your pharmacy contact our office and allow 72 hours for refills to be completed.    Today you received the following chemotherapy and/or immunotherapy agents: Docetaxel (Taxotere) and Cyclophosphamide (Cytoxan)       To help prevent nausea and vomiting after your treatment, we encourage you to take your nausea medication as directed.  BELOW ARE SYMPTOMS THAT SHOULD BE REPORTED IMMEDIATELY: *FEVER GREATER THAN 100.4 F (38 C) OR HIGHER *CHILLS OR SWEATING *NAUSEA AND VOMITING THAT IS NOT CONTROLLED WITH YOUR NAUSEA MEDICATION *UNUSUAL SHORTNESS OF BREATH *UNUSUAL BRUISING OR BLEEDING *URINARY PROBLEMS (pain or burning when urinating, or frequent urination) *BOWEL PROBLEMS (unusual diarrhea, constipation, pain near the anus) TENDERNESS IN MOUTH AND THROAT WITH OR WITHOUT PRESENCE OF ULCERS (sore throat, sores in mouth, or a toothache) UNUSUAL RASH, SWELLING OR PAIN  UNUSUAL VAGINAL DISCHARGE OR ITCHING   Items with * indicate a potential emergency and should be followed up as soon as possible or go to the Emergency Department if any problems should occur.  Please show the CHEMOTHERAPY  ALERT CARD or IMMUNOTHERAPY ALERT CARD at check-in to the Emergency Department and triage nurse.  Should you have questions after your visit or need to cancel or reschedule your appointment, please contact [facility]  Dept: [phone]  and follow the prompts.  Office hours are 8:00 a.m. to 4:30 p.m. Monday - Friday. Please note that voicemails left after 4:00 p.m. may not be returned until the following business day.  We are closed weekends and major holidays. You have access to a nurse at all times for urgent questions. Please call the main number to the clinic Dept: [phone] and follow the prompts.   For any non-urgent questions, you may also contact your provider using MyChart. We now offer e-Visits for anyone 18 and older to request care online for non-urgent symptoms. For details visit [email or web address].   Also download the MyChart app! Go to the app store, search "MyChart", open the app, select [facility], and log in with your MyChart username and password.  

## 2022-08-10 ENCOUNTER — Telehealth: Payer: Self-pay

## 2022-08-10 ENCOUNTER — Other Ambulatory Visit: Payer: Self-pay

## 2022-08-10 MED ORDER — DAPAGLIFLOZIN PROPANEDIOL 10 MG PO TABS: 10.0000 mg | ORAL_TABLET | Freq: Every day | ORAL | 2 refills | Status: DC

## 2022-08-10 NOTE — Telephone Encounter (Signed)
Nicole Nunez states that she is doing fine. She is eating, drinking, and urinating well. She knows to call the office at [phone] if she has any questions or concerns.

## 2022-08-10 NOTE — Telephone Encounter (Signed)
-----   Message from Severiano Gilbert, RN sent at 08/09/2022  3:35 PM EDT ----- Regarding: First Time Taxotere and Cytoxan - Dr. Lindi Adie Patient First Time Taxotere and Cytoxan - Dr. Lindi Adie Patient Patient tolerated treatment well.

## 2022-08-11 ENCOUNTER — Inpatient Hospital Stay (HOSPITAL_BASED_OUTPATIENT_CLINIC_OR_DEPARTMENT_OTHER): Payer: Medicare Other

## 2022-08-11 ENCOUNTER — Other Ambulatory Visit: Payer: Self-pay

## 2022-08-11 VITALS — BP 154/81 | HR 79 | Temp 98.5°F | Resp 16

## 2022-08-11 DIAGNOSIS — Z7982 Long term (current) use of aspirin: Secondary | ICD-10-CM | POA: Diagnosis not present

## 2022-08-11 DIAGNOSIS — K219 Gastro-esophageal reflux disease without esophagitis: Secondary | ICD-10-CM | POA: Diagnosis not present

## 2022-08-11 DIAGNOSIS — Z5112 Encounter for antineoplastic immunotherapy: Secondary | ICD-10-CM | POA: Diagnosis not present

## 2022-08-11 DIAGNOSIS — E7800 Pure hypercholesterolemia, unspecified: Secondary | ICD-10-CM | POA: Diagnosis not present

## 2022-08-11 DIAGNOSIS — E559 Vitamin D deficiency, unspecified: Secondary | ICD-10-CM | POA: Diagnosis not present

## 2022-08-11 DIAGNOSIS — C50512 Malignant neoplasm of lower-outer quadrant of left female breast: Secondary | ICD-10-CM | POA: Diagnosis not present

## 2022-08-11 DIAGNOSIS — Z79899 Other long term (current) drug therapy: Secondary | ICD-10-CM | POA: Diagnosis not present

## 2022-08-11 DIAGNOSIS — E1122 Type 2 diabetes mellitus with diabetic chronic kidney disease: Secondary | ICD-10-CM | POA: Diagnosis not present

## 2022-08-11 DIAGNOSIS — Z7984 Long term (current) use of oral hypoglycemic drugs: Secondary | ICD-10-CM | POA: Diagnosis not present

## 2022-08-11 DIAGNOSIS — I129 Hypertensive chronic kidney disease with stage 1 through stage 4 chronic kidney disease, or unspecified chronic kidney disease: Secondary | ICD-10-CM | POA: Diagnosis not present

## 2022-08-11 DIAGNOSIS — Z170 Estrogen receptor positive status [ER+]: Secondary | ICD-10-CM | POA: Diagnosis not present

## 2022-08-11 DIAGNOSIS — E1140 Type 2 diabetes mellitus with diabetic neuropathy, unspecified: Secondary | ICD-10-CM | POA: Diagnosis not present

## 2022-08-11 DIAGNOSIS — N183 Chronic kidney disease, stage 3 unspecified: Secondary | ICD-10-CM | POA: Diagnosis not present

## 2022-08-11 MED ORDER — PEGFILGRASTIM-CBQV 6 MG/0.6ML ~~LOC~~ SOSY
6.0000 mg | PREFILLED_SYRINGE | Freq: Once | SUBCUTANEOUS | Status: AC
  Administered 2022-08-11: 6 mg via SUBCUTANEOUS
  Filled 2022-08-11: qty 0.6

## 2022-08-11 NOTE — Patient Instructions (Signed)

## 2022-08-12 ENCOUNTER — Ambulatory Visit: Payer: Medicare Other

## 2022-08-12 ENCOUNTER — Other Ambulatory Visit: Payer: Self-pay | Admitting: *Deleted

## 2022-08-12 ENCOUNTER — Other Ambulatory Visit: Payer: Medicare Other

## 2022-08-12 ENCOUNTER — Ambulatory Visit: Payer: Medicare Other | Admitting: Hematology and Oncology

## 2022-08-12 ENCOUNTER — Telehealth: Payer: Self-pay

## 2022-08-12 DIAGNOSIS — Z170 Estrogen receptor positive status [ER+]: Secondary | ICD-10-CM

## 2022-08-12 NOTE — Telephone Encounter (Signed)
Called Nicole Nunez to inform her that [employer] is requesting more proof of annual household income, ie most recent federal tax return. She is going to call the number on the bottom of the letter, because she does not file taxes and request further assistance the number is [phone].   Orlando Penner, CPP, PharmD Clinical Pharmacist Practitioner [facility] [phone]

## 2022-08-13 ENCOUNTER — Other Ambulatory Visit: Payer: Self-pay | Admitting: Internal Medicine

## 2022-08-15 ENCOUNTER — Ambulatory Visit: Payer: Medicare Other

## 2022-08-16 ENCOUNTER — Inpatient Hospital Stay (HOSPITAL_BASED_OUTPATIENT_CLINIC_OR_DEPARTMENT_OTHER): Payer: Medicare Other | Admitting: Adult Health

## 2022-08-16 ENCOUNTER — Encounter: Payer: Self-pay | Admitting: Adult Health

## 2022-08-16 ENCOUNTER — Inpatient Hospital Stay: Payer: Medicare Other

## 2022-08-16 VITALS — BP 124/58 | HR 95 | Temp 98.7°F | Resp 18 | Ht <= 58 in | Wt 147.8 lb

## 2022-08-16 DIAGNOSIS — Z170 Estrogen receptor positive status [ER+]: Secondary | ICD-10-CM

## 2022-08-16 DIAGNOSIS — B370 Candidal stomatitis: Secondary | ICD-10-CM

## 2022-08-16 DIAGNOSIS — Z7982 Long term (current) use of aspirin: Secondary | ICD-10-CM | POA: Diagnosis not present

## 2022-08-16 DIAGNOSIS — Z79899 Other long term (current) drug therapy: Secondary | ICD-10-CM | POA: Diagnosis not present

## 2022-08-16 DIAGNOSIS — E559 Vitamin D deficiency, unspecified: Secondary | ICD-10-CM | POA: Diagnosis not present

## 2022-08-16 DIAGNOSIS — Z5112 Encounter for antineoplastic immunotherapy: Secondary | ICD-10-CM | POA: Diagnosis not present

## 2022-08-16 DIAGNOSIS — E1122 Type 2 diabetes mellitus with diabetic chronic kidney disease: Secondary | ICD-10-CM | POA: Diagnosis not present

## 2022-08-16 DIAGNOSIS — C50512 Malignant neoplasm of lower-outer quadrant of left female breast: Secondary | ICD-10-CM

## 2022-08-16 DIAGNOSIS — Z7984 Long term (current) use of oral hypoglycemic drugs: Secondary | ICD-10-CM | POA: Diagnosis not present

## 2022-08-16 DIAGNOSIS — Z95828 Presence of other vascular implants and grafts: Secondary | ICD-10-CM | POA: Insufficient documentation

## 2022-08-16 DIAGNOSIS — N183 Chronic kidney disease, stage 3 unspecified: Secondary | ICD-10-CM | POA: Diagnosis not present

## 2022-08-16 DIAGNOSIS — E1140 Type 2 diabetes mellitus with diabetic neuropathy, unspecified: Secondary | ICD-10-CM | POA: Diagnosis not present

## 2022-08-16 DIAGNOSIS — I129 Hypertensive chronic kidney disease with stage 1 through stage 4 chronic kidney disease, or unspecified chronic kidney disease: Secondary | ICD-10-CM | POA: Diagnosis not present

## 2022-08-16 DIAGNOSIS — K219 Gastro-esophageal reflux disease without esophagitis: Secondary | ICD-10-CM | POA: Diagnosis not present

## 2022-08-16 DIAGNOSIS — E7800 Pure hypercholesterolemia, unspecified: Secondary | ICD-10-CM | POA: Diagnosis not present

## 2022-08-16 LAB — CBC WITH DIFFERENTIAL (CANCER CENTER ONLY)
Abs Immature Granulocytes: 1.02 10*3/uL — ABNORMAL HIGH (ref 0.00–0.07)
Basophils Absolute: 0 10*3/uL (ref 0.0–0.1)
Basophils Relative: 0 %
Eosinophils Absolute: 0.1 10*3/uL (ref 0.0–0.5)
Eosinophils Relative: 1 %
HCT: 32.9 % — ABNORMAL LOW (ref 36.0–46.0)
Hemoglobin: 10.8 g/dL — ABNORMAL LOW (ref 12.0–15.0)
Immature Granulocytes: 12 %
Lymphocytes Relative: 21 %
Lymphs Abs: 1.7 10*3/uL (ref 0.7–4.0)
MCH: 28.1 pg (ref 26.0–34.0)
MCHC: 32.8 g/dL (ref 30.0–36.0)
MCV: 85.5 fL (ref 80.0–100.0)
Monocytes Absolute: 1.5 10*3/uL — ABNORMAL HIGH (ref 0.1–1.0)
Monocytes Relative: 17 %
Neutro Abs: 4.1 10*3/uL (ref 1.7–7.7)
Neutrophils Relative %: 49 %
Platelet Count: 245 10*3/uL (ref 150–400)
RBC: 3.85 MIL/uL — ABNORMAL LOW (ref 3.87–5.11)
RDW: 15 % (ref 11.5–15.5)
Smear Review: NORMAL
WBC Count: 8.4 10*3/uL (ref 4.0–10.5)
nRBC: 0 % (ref 0.0–0.2)

## 2022-08-16 LAB — CMP (CANCER CENTER ONLY)
ALT: 18 U/L (ref 0–44)
AST: 22 U/L (ref 15–41)
Albumin: 3.9 g/dL (ref 3.5–5.0)
Alkaline Phosphatase: 102 U/L (ref 38–126)
Anion gap: 6 (ref 5–15)
BUN: 18 mg/dL (ref 8–23)
CO2: 30 mmol/L (ref 22–32)
Calcium: 9.2 mg/dL (ref 8.9–10.3)
Chloride: 104 mmol/L (ref 98–111)
Creatinine: 1.38 mg/dL — ABNORMAL HIGH (ref 0.44–1.00)
GFR, Estimated: 40 mL/min — ABNORMAL LOW (ref >60)
Glucose, Bld: 137 mg/dL — ABNORMAL HIGH (ref 70–99)
Potassium: 4 mmol/L (ref 3.5–5.1)
Sodium: 140 mmol/L (ref 135–145)
Total Bilirubin: 0.4 mg/dL (ref 0.3–1.2)
Total Protein: 6.6 g/dL (ref 6.5–8.1)

## 2022-08-16 MED ORDER — SODIUM CHLORIDE 0.9% FLUSH
10.0000 mL | Freq: Once | INTRAVENOUS | Status: AC
  Administered 2022-08-16: 10 mL

## 2022-08-16 MED ORDER — FLUCONAZOLE 100 MG PO TABS: 100.0000 mg | ORAL_TABLET | Freq: Every day | ORAL | 0 refills | Status: DC

## 2022-08-16 MED ORDER — HEPARIN SOD (PORK) LOCK FLUSH 100 UNIT/ML IV SOLN
500.0000 [IU] | Freq: Once | INTRAVENOUS | Status: AC
  Administered 2022-08-16: 500 [IU]

## 2022-08-16 NOTE — Progress Notes (Signed)
[facility] up:    [address], [address] [place] [address]   DIAGNOSIS:  Cancer Staging  Malignant neoplasm of lower-outer quadrant of left breast of female, estrogen receptor positive ([place]) Staging form: Breast, AJCC 8th Edition - Clinical: Stage IA (cT1b, cN0, cM0, G2, ER+, PR+, HER2-) - Signed by Hayden Pedro, PA-C on 06/05/2022 Method of lymph node assessment: Clinical Histologic grading system: 3 grade system - Pathologic: Stage IA (pT1c, pN0, cM0, G3, ER+, PR+, HER2-) - Signed by Nicholas Lose, MD on 06/29/2022 Histologic grading system: 3 grade system   SUMMARY OF ONCOLOGIC HISTORY: Oncology History  Malignant neoplasm of lower-outer quadrant of left breast of female, estrogen receptor positive ([place])  05/18/2022 Initial Diagnosis   05/18/2022: Screening mammogram detected left breast mass by ultrasound it measured 9 mm, biopsy revealed grade 2 IDC ER 90%, PR 15%, Ki-67 50%, HER2 2+ by IHC, FISH negative ratio 2.17, Copy #3.9, additional 20 cells were examined ratio came at 2.19 and copy #3.72   06/05/2022 Cancer Staging   Staging form: Breast, AJCC 8th Edition - Clinical: Stage IA (cT1b, cN0, cM0, G2, ER+, PR+, HER2-) - Signed by Hayden Pedro, PA-C on 06/05/2022 Method of lymph node assessment: Clinical Histologic grading system: 3 grade system   06/21/2022 Surgery   Left lumpectomy: Grade 3 IDC 1.5 cm, margins negative, no lymph node (adipose tissue was in the sentinel node) ER 90% weak to moderate, PR 15% weak to strong, HER2 negative, Ki-67 50% Repeat prognostic panel: ER 0%, PR 0%, Ki-67 35%, HER2 2+ by IHC FISH negative ratio 1.72 (triple negative breast cancer)   06/29/2022 Cancer Staging   Staging form: Breast, AJCC 8th Edition - Pathologic: Stage IA (pT1c, pN0, cM0, G3, ER+, PR+, HER2-) - Signed by Nicholas Lose, MD on 06/29/2022 Histologic grading system: 3 grade system   08/09/2022 -  Chemotherapy    Patient is on Treatment Plan : BREAST TC q21d       CURRENT THERAPY: Taxotere/Cytoxan  INTERVAL HISTORY: SARANNE PINTA 77 y.o. female returns for f/u after her first cycle of taxotere and cytoxan.  She says the treatment went well.  She developed achiness after receiving the injection and it was most notable yesterday.  She was previously taking claritin daily with tylenol, and forgot to do this yesterday.  When she remembered she took the tylenol she felt better after 30 minutes.   She experienced mild nausea daily in the mornings, but this resolved with out any medication. She has history of chronic constipation and this is unchanged.  She has baseline peripheral neuropathy and this is unchanged.    Patient Active Problem List   Diagnosis Date Noted   Port-A-Cath in place 08/16/2022   Malignant neoplasm of lower-outer quadrant of left breast of female, estrogen receptor positive ([place]) 05/31/2022   Statin myopathy 02/04/2021   Notalgia 01/29/2020   Vitamin D deficiency disease 01/29/2020   Class 1 obesity due to excess calories with serious comorbidity and body mass index (BMI) of 32.0 to 32.9 in adult 01/29/2020   Type 2 diabetes mellitus with stage 3 chronic kidney disease, without long-term current use of insulin ([place]) 05/24/2018   Chronic renal disease, stage III ([place]) 05/24/2018   Parenchymal renal hypertension 05/24/2018   Pure hypercholesterolemia 05/24/2018   Class 1 obesity due to excess calories with serious comorbidity and body mass index (BMI) of 31.0 to 31.9 in adult 05/24/2018    is  allergic to statins.  MEDICAL HISTORY: Past Medical History:  Diagnosis Date   Arthritis    Breast cancer ([place]) 05/18/2022   CKD stage 3 due to type 2 diabetes mellitus (HCC)    DM (diabetes mellitus) ([place])    GERD (gastroesophageal reflux disease)    High cholesterol    HTN (hypertension)     SURGICAL HISTORY: Past Surgical History:  Procedure Laterality Date   BREAST  BIOPSY Left 05/18/2022   [place] LT BREAST BX W LOC DEV 1ST LESION IMG BX SPEC US GUIDE 05/18/2022 [facility]   BREAST BIOPSY Left 06/20/2022   [place] LT RADIOACTIVE SEED LOC 06/20/2022 [facility]   BREAST LUMPECTOMY WITH RADIOACTIVE SEED AND AXILLARY LYMPH NODE DISSECTION Left 06/21/2022   Procedure: LEFT BREAST LUMPECTOMY WITH RADIOACTIVE SEED AND AXILLARY NODE DISSECTION;  Surgeon: Erroll Luna, MD;  Location: [facility];  Service: General;  Laterality: Left;   CATARACT EXTRACTION, BILATERAL  01/2018   first was 12/2017   corn removal Bilateral 06/2017   EYE SURGERY     PORTACATH PLACEMENT N/A 07/21/2022   Procedure: INSERTION PORT-A-CATH;  Surgeon: Erroll Luna, MD;  Location: [facility];  Service: General;  Laterality: N/A;  90 MIN ROOM 4-OK'D PER KELLY   SENTINEL NODE BIOPSY Left 07/21/2022   Procedure: LEFT SENTINEL NODE BIOPSY;  Surgeon: Erroll Luna, MD;  Location: [facility];  Service: General;  Laterality: Left;    SOCIAL HISTORY: Social History   Socioeconomic History   Marital status: Widowed    Spouse name: Not on file   Number of children: Not on file   Years of education: Not on file   Highest education level: Not on file  Occupational History   Not on file  Tobacco Use   Smoking status: Never   Smokeless tobacco: Never  Vaping Use   Vaping Use: Never used  Substance and Sexual Activity   Alcohol use: Never   Drug use: Never   Sexual activity: Yes  Other Topics Concern   Not on file  Social History Narrative   Not on file   Social Determinants of Health   Financial Resource Strain: Low Risk  (02/10/2022)   Overall Financial Resource Strain (CARDIA)    Difficulty of Paying Living Expenses: Not hard at all  Food Insecurity: No Food Insecurity (02/10/2022)   Hunger Vital Sign    Worried About Running Out of Food in the Last Year: Never true    [place] in the Last Year: Never true  Transportation Needs: No Transportation Needs  (02/10/2022)   PRAPARE - Hydrologist (Medical): No    Lack of Transportation (Non-Medical): No  Physical Activity: Inactive (02/10/2022)   Exercise Vital Sign    Days of Exercise per Week: 0 days    Minutes of Exercise per Session: 0 min  Stress: No Stress Concern Present (02/10/2022)   [place]    Feeling of Stress : Not at all  Social Connections: Unknown (05/05/2020)   Social Connection and Isolation Panel [NHANES]    Frequency of Communication with Friends and Family: More than three times a week    Frequency of Social Gatherings with Friends and Family: More than three times a week    Attends Religious Services: More than 4 times per year    Active Member of [employer] or Organizations: Yes    Attends Archivist Meetings: More than 4 times  per year    Marital Status: Not on file  Intimate Partner Violence: Not At Risk (12/20/2018)   Humiliation, Afraid, Rape, and Kick questionnaire    Fear of Current or Ex-Partner: No    Emotionally Abused: No    Physically Abused: No    Sexually Abused: No    FAMILY HISTORY: Family History  Problem Relation Age of Onset   Asthma Mother    Hypertension Mother    Alzheimer's disease Mother    Early death Father     Review of Systems  Constitutional:  Negative for appetite change, chills, fatigue, fever and unexpected weight change.  HENT:   Negative for hearing loss, lump/mass and trouble swallowing.   Eyes:  Negative for eye problems and icterus.  Respiratory:  Negative for chest tightness, cough and shortness of breath.   Cardiovascular:  Negative for chest pain, leg swelling and palpitations.  Gastrointestinal:  Positive for constipation and nausea. Negative for abdominal distention, abdominal pain, diarrhea and vomiting.  Endocrine: Negative for hot flashes.  Genitourinary:  Negative for difficulty urinating.   Musculoskeletal:   Negative for arthralgias.  Skin:  Negative for itching and rash.  Neurological:  Positive for numbness. Negative for dizziness, extremity weakness and headaches.  Hematological:  Negative for adenopathy. Does not bruise/bleed easily.  Psychiatric/Behavioral:  Negative for depression. The patient is not nervous/anxious.       PHYSICAL EXAMINATION  ECOG PERFORMANCE STATUS: 1 - Symptomatic but completely ambulatory  Vitals:   08/16/22 1037  BP: (!) 124/58  Pulse: 95  Resp: 18  Temp: 98.7 F (37.1 C)  SpO2: 98%    Physical Exam Constitutional:      General: She is not in acute distress.    Appearance: Normal appearance. She is not toxic-appearing.  HENT:     Head: Normocephalic and atraumatic.  Eyes:     General: No scleral icterus. Cardiovascular:     Rate and Rhythm: Normal rate and regular rhythm.     Pulses: Normal pulses.     Heart sounds: Normal heart sounds.  Pulmonary:     Effort: Pulmonary effort is normal.     Breath sounds: Normal breath sounds.  Abdominal:     General: Abdomen is flat. Bowel sounds are normal. There is no distension.     Palpations: Abdomen is soft.     Tenderness: There is no abdominal tenderness.  Musculoskeletal:        General: No swelling.     Cervical back: Neck supple.  Lymphadenopathy:     Cervical: No cervical adenopathy.  Skin:    General: Skin is warm and dry.     Findings: No rash.  Neurological:     General: No focal deficit present.     Mental Status: She is alert.  Psychiatric:        Mood and Affect: Mood normal.        Behavior: Behavior normal.     LABORATORY DATA:  CBC    Component Value Date/Time   WBC 8.4 08/16/2022 1012   WBC 5.6 11/11/2008 1237   RBC 3.85 (L) 08/16/2022 1012   HGB 10.8 (L) 08/16/2022 1012   HGB 12.5 02/17/2022 1147   HCT 32.9 (L) 08/16/2022 1012   HCT 38.4 02/17/2022 1147   PLT 245 08/16/2022 1012   PLT 309 02/17/2022 1147   MCV 85.5 08/16/2022 1012   MCV 84 02/17/2022 1147   MCH  28.1 08/16/2022 1012   MCHC 32.8 08/16/2022 1012  RDW 15.0 08/16/2022 1012   RDW 13.7 02/17/2022 1147   LYMPHSABS PENDING 08/16/2022 1012   MONOABS PENDING 08/16/2022 1012   EOSABS PENDING 08/16/2022 1012   BASOSABS PENDING 08/16/2022 1012    CMP     Component Value Date/Time   NA 140 08/16/2022 1012   NA 144 02/17/2022 1147   K 4.0 08/16/2022 1012   CL 104 08/16/2022 1012   CO2 30 08/16/2022 1012   GLUCOSE 137 (H) 08/16/2022 1012   BUN 18 08/16/2022 1012   BUN 23 02/17/2022 1147   CREATININE 1.38 (H) 08/16/2022 1012   CALCIUM 9.2 08/16/2022 1012   PROT 6.6 08/16/2022 1012   PROT 7.4 02/17/2022 1147   ALBUMIN 3.9 08/16/2022 1012   ALBUMIN 4.6 02/17/2022 1147   AST 22 08/16/2022 1012   ALT 18 08/16/2022 1012   ALKPHOS 102 08/16/2022 1012   BILITOT 0.4 08/16/2022 1012   GFRNONAA 40 (L) 08/16/2022 1012   GFRAA 47 (L) 06/02/2020 0942        ASSESSMENT and THERAPY PLAN:   Malignant neoplasm of lower-outer quadrant of left breast of female, estrogen receptor positive ([place]) Dub Mikes is a 77 year old woman with stage Ia triple negative left-sided breast cancer diagnosed in January 2024.  She is status postlumpectomy and continues on adjuvant chemotherapy with Taxotere and Cytoxan.  Treatment plan: Breast conserving surgery with sentinel lymph node biopsy Adjuvant chemotherapy with Taxotere and Cytoxan every 3 weeks x 4 Adjuvant radiation therapy followed by Adjuvant antiestrogen therapy  Chemo toxicities: Bony aches and pains-this is from Neulasta she will continue Tylenol with her Claritin as this is managing this well. Peripheral neuropathy-this is at baseline.  We will continue to monitor this closely.  Francis's labs are stable though majority of them are still pending.  I let her know that we will call her with the results once they are all back if anything is abnormal.  Overall she tolerated treatment well and we will see her back in 2 weeks for labs, follow-up,  and cycle 2 of her adjuvant chemotherapy.   All questions were answered. The patient knows to call the clinic with any problems, questions or concerns. We can certainly see the patient much sooner if necessary.  Total encounter time:20 minutes*in face-to-face visit time, chart review, lab review, care coordination, order entry, and documentation of the encounter time.    Wilber Bihari, NP 08/16/22 11:26 AM Medical Oncology and Hematology [facility] [place], [place] [address] Tel. [phone]    Fax. [phone]  *Total Encounter Time as defined by the Centers for Medicare and Medicaid Services includes, in addition to the face-to-face time of a patient visit (documented in the note above) non-face-to-face time: obtaining and reviewing outside history, ordering and reviewing medications, tests or procedures, care coordination (communications with other health care professionals or caregivers) and documentation in the medical record.

## 2022-08-16 NOTE — Assessment & Plan Note (Addendum)
Nicole Nunez is a 77 year old woman with stage Ia triple negative left-sided breast cancer diagnosed in January 2024.  She is status postlumpectomy and continues on adjuvant chemotherapy with Taxotere and Cytoxan.  Treatment plan: Breast conserving surgery with sentinel lymph node biopsy Adjuvant chemotherapy with Taxotere and Cytoxan every 3 weeks x 4 Adjuvant radiation therapy followed by Adjuvant antiestrogen therapy  Chemo toxicities: Bony aches and pains-this is from Neulasta she will continue Tylenol with her Claritin as this is managing this well. Peripheral neuropathy-this is at baseline.  We will continue to monitor this closely.  Nicole Nunez's labs are stable though majority of them are still pending.  I let her know that we will call her with the results once they are all back if anything is abnormal.  Overall she tolerated treatment well and we will see her back in 2 weeks for labs, follow-up, and cycle 2 of her adjuvant chemotherapy.

## 2022-08-23 DIAGNOSIS — C50912 Malignant neoplasm of unspecified site of left female breast: Secondary | ICD-10-CM | POA: Diagnosis not present

## 2022-08-29 MED FILL — Dexamethasone Sodium Phosphate Inj 100 MG/10ML: INTRAMUSCULAR | Qty: 1 | Status: AC

## 2022-08-29 NOTE — Progress Notes (Signed)
Patient Care Team: Nicole Nunez, Robyn, MD as PCP - General (Internal Medicine) Nicole Nunez, Nicole Nune[facility], [facility] ([facility]) Nicole Nunez, Vinay, MD as Consulting Physician (Hematology and Oncology) Nicole Nunez, Nicole N, RN as Oncology Nurse Navigator Nicole Nunez, Nicole C, RN as Oncology Nurse Navigator Nicole Bouillonornett, Thomas, MD as Consulting Physician (General Surgery) Nicole Nunez, John, MD as Consulting Physician (Radiation Oncology)  DIAGNOSIS: No diagnosis found.  SUMMARY OF ONCOLOGIC HISTORY: Oncology History  Malignant neoplasm of lower-outer quadrant of left breast of female, estrogen receptor positive  05/18/2022 Initial Diagnosis   05/18/2022: Screening mammogram detected left breast mass by ultrasound it measured 9 mm, biopsy revealed grade 2 IDC ER 90%, PR 15%, Ki-67 50%, HER2 2+ by IHC, FISH negative ratio 2.17, Copy #3.9, additional 20 cells were examined ratio came at 2.19 and copy #3.72   06/05/2022 Cancer Staging   Staging form: Breast, AJCC 8th Edition - Clinical: Stage IA (cT1b, cN0, cM0, G2, ER+, PR+, HER2-) - Signed by Ronny BaconPerkins, Nicole Claire, PA-Nunez on 06/05/2022 Method of lymph node assessment: Clinical Histologic grading system: 3 grade system   06/21/2022 Surgery   Left lumpectomy: Grade 3 IDC 1.5 cm, margins negative, no lymph node (adipose tissue was in the sentinel node) ER 90% weak to moderate, PR 15% weak to strong, HER2 negative, Ki-67 50% Repeat prognostic panel: ER 0%, PR 0%, Ki-67 35%, HER2 2+ by IHC FISH negative ratio 1.72 (triple negative breast cancer)   06/29/2022 Cancer Staging   Staging form: Breast, AJCC 8th Edition - Pathologic: Stage IA (pT1c, pN0, cM0, G3, ER+, PR+, HER2-) - Signed by Nicole Nunez, Vinay, MD on 06/29/2022 Histologic grading system: 3 grade system   08/09/2022 -  Chemotherapy   Patient is on Treatment Plan : BREAST TC q21d       CHIEF COMPLIANT: toxicity check  INTERVAL HISTORY: Nicole Nunez is a 77 y.o. female is here because of above-mentioned history of left  breast cancer she underwent lumpectomy and apparently prognostic panel came back as triple negative disease. She presents to the clinic for a follow-up.    ALLERGIES:  is allergic to statins.  MEDICATIONS:  Current Outpatient Medications  Medication Sig Dispense Refill   Ascorbic Acid (VITAMIN Nunez) 1000 MG tablet Take 1,000 mg by mouth daily.     aspirin EC 81 MG tablet Take 81 mg by mouth daily.     b complex vitamins tablet Take 1 tablet by mouth daily.     Calcium Carbonate (CALCIUM 600 PO) Take 600 mg by mouth daily.     Cholecalciferol (VITAMIN D3) 125 MCG (5000 UT) CAPS Take 5,000 Units by mouth daily.     dapagliflozin propanediol (FARXIGA) 10 MG TABS tablet Take 1 tablet (10 mg total) by mouth daily. 90 tablet 2   fluconazole (DIFLUCAN) 100 MG tablet Take 1 tablet (100 mg total) by mouth daily. 3 tablet 0   Lancet Devices (ONE TOUCH DELICA LANCING DEV) MISC USE AS DIRECTED TO CHECK BLOOD SUGARS ONCE DAILY dx: e11.22 1 each 2   lidocaine-prilocaine (EMLA) cream Apply to affected area once 30 g 3   loratadine (CLARITIN) 10 MG tablet TAKE 1 TABLET BY MOUTH EVERY DAY 30 tablet 2   magnesium gluconate (MAGONATE) 500 MG tablet Take 500 mg by mouth daily.     Multiple Vitamin (MULTIVITAMIN) tablet Take 1 tablet by mouth daily.     olmesartan (BENICAR) 20 MG tablet Take 1 tablet (20 mg total) by mouth daily. 100 tablet 2   ondansetron (ZOFRAN) 8 MG tablet Take  1 tablet (8 mg total) by mouth every 8 (eight) hours as needed for nausea or vomiting. Start on the third day after chemotherapy. (Patient not taking: Reported on 08/16/2022) 30 tablet 1   ONETOUCH VERIO test strip USE TO CHECK BLOOD SUGAR DAILY 100 strip 2   oxyCODONE (OXY IR/ROXICODONE) 5 MG immediate release tablet Take 1 tablet (5 mg total) by mouth every 6 (six) hours as needed for severe pain. (Patient not taking: Reported on 08/16/2022) 15 tablet 0   oxyCODONE (OXY IR/ROXICODONE) 5 MG immediate release tablet Take 1 tablet (5 mg  total) by mouth every 6 (six) hours as needed for severe pain. (Patient not taking: Reported on 08/16/2022) 15 tablet 0   pantoprazole (PROTONIX) 40 MG tablet Take 1 tablet (40 mg total) by mouth daily. (Patient not taking: Reported on 08/16/2022) 100 tablet 2   Pitavastatin Magnesium 4 MG TABS Take 1 tablet by mouth daily. 30 tablet 11   prochlorperazine (COMPAZINE) 10 MG tablet Take 1 tablet (10 mg total) by mouth every 6 (six) hours as needed for nausea or vomiting. (Patient not taking: Reported on 08/16/2022) 30 tablet 1   Semaglutide,0.25 or 0.5MG /DOS, (OZEMPIC, 0.25 OR 0.5 MG/DOSE,) 2 MG/3ML SOPN Inject 0.5mg  SQ weekly. 3 mL 0   Sodium Chloride-Xylitol (XLEAR SINUS CARE SPRAY NA) Place 1 spray into the nose daily as needed (Congestion). (Patient not taking: Reported on 08/16/2022)     zinc gluconate 50 MG tablet Take 50 mg by mouth daily.     No current facility-administered medications for this visit.    PHYSICAL EXAMINATION: ECOG PERFORMANCE STATUS: {CHL ONC ECOG PS:[phone]}  There were no vitals filed for this visit. There were no vitals filed for this visit.  BREAST:*** No palpable masses or nodules in either right or left breasts. No palpable axillary supraclavicular or infraclavicular adenopathy no breast tenderness or nipple discharge. (exam performed in the presence of a chaperone)  LABORATORY DATA:  I have reviewed the data as listed    Latest Ref Rng & Units 08/16/2022   10:12 AM 08/09/2022   10:40 AM 07/21/2022   12:28 PM  CMP  Glucose 70 - 99 mg/dL 169  678  938   BUN 8 - 23 mg/dL 18  18  20    Creatinine 0.44 - 1.00 mg/dL 1.01  7.51  0.25   Sodium 135 - 145 mmol/L 140  141  139   Potassium 3.5 - 5.1 mmol/L 4.0  4.0  4.0   Chloride 98 - 111 mmol/L 104  105  103   CO2 22 - 32 mmol/L 30  31    Calcium 8.9 - 10.3 mg/dL 9.2  9.5    Total Protein 6.5 - 8.1 g/dL 6.6  6.7    Total Bilirubin 0.3 - 1.2 mg/dL 0.4  0.5    Alkaline Phos 38 - 126 U/L 102  95    AST 15 - 41 U/L  22  23    ALT 0 - 44 U/L 18  16      Lab Results  Component Value Date   WBC 8.4 08/16/2022   HGB 10.8 (L) 08/16/2022   HCT 32.9 (L) 08/16/2022   MCV 85.5 08/16/2022   PLT 245 08/16/2022   NEUTROABS 4.1 08/16/2022    ASSESSMENT & PLAN:  No problem-specific Assessment & Plan notes found for this encounter.    No orders of the defined types were placed in this encounter.  The patient has a good understanding of the  overall plan. she agrees with it. she will call with any problems that may develop before the next visit here. Total time spent: 30 mins including face to face time and time spent for planning, charting and co-ordination of care   Sherlyn Lick, CMA 08/29/22    I Janan Ridge am acting as a Neurosurgeon for [employer]  ***

## 2022-08-30 ENCOUNTER — Inpatient Hospital Stay: Payer: Medicare Other | Attending: Hematology and Oncology | Admitting: Hematology and Oncology

## 2022-08-30 ENCOUNTER — Inpatient Hospital Stay: Payer: Medicare Other

## 2022-08-30 VITALS — BP 153/66 | HR 93 | Temp 97.8°F | Resp 18 | Ht <= 58 in | Wt 150.2 lb

## 2022-08-30 VITALS — BP 137/73 | HR 80 | Resp 17

## 2022-08-30 DIAGNOSIS — Z95828 Presence of other vascular implants and grafts: Secondary | ICD-10-CM

## 2022-08-30 DIAGNOSIS — Z7984 Long term (current) use of oral hypoglycemic drugs: Secondary | ICD-10-CM | POA: Insufficient documentation

## 2022-08-30 DIAGNOSIS — D6481 Anemia due to antineoplastic chemotherapy: Secondary | ICD-10-CM | POA: Insufficient documentation

## 2022-08-30 DIAGNOSIS — T451X5A Adverse effect of antineoplastic and immunosuppressive drugs, initial encounter: Secondary | ICD-10-CM | POA: Diagnosis not present

## 2022-08-30 DIAGNOSIS — Z7982 Long term (current) use of aspirin: Secondary | ICD-10-CM | POA: Diagnosis not present

## 2022-08-30 DIAGNOSIS — Z170 Estrogen receptor positive status [ER+]: Secondary | ICD-10-CM

## 2022-08-30 DIAGNOSIS — Z79899 Other long term (current) drug therapy: Secondary | ICD-10-CM | POA: Insufficient documentation

## 2022-08-30 DIAGNOSIS — Z5189 Encounter for other specified aftercare: Secondary | ICD-10-CM | POA: Diagnosis not present

## 2022-08-30 DIAGNOSIS — G629 Polyneuropathy, unspecified: Secondary | ICD-10-CM | POA: Diagnosis not present

## 2022-08-30 DIAGNOSIS — C50512 Malignant neoplasm of lower-outer quadrant of left female breast: Secondary | ICD-10-CM | POA: Insufficient documentation

## 2022-08-30 DIAGNOSIS — Z5111 Encounter for antineoplastic chemotherapy: Secondary | ICD-10-CM | POA: Diagnosis not present

## 2022-08-30 DIAGNOSIS — Z171 Estrogen receptor negative status [ER-]: Secondary | ICD-10-CM | POA: Insufficient documentation

## 2022-08-30 LAB — CBC WITH DIFFERENTIAL (CANCER CENTER ONLY)
Abs Immature Granulocytes: 0.02 10*3/uL (ref 0.00–0.07)
Basophils Absolute: 0.1 10*3/uL (ref 0.0–0.1)
Basophils Relative: 1 %
Eosinophils Absolute: 0 10*3/uL (ref 0.0–0.5)
Eosinophils Relative: 1 %
HCT: 30.2 % — ABNORMAL LOW (ref 36.0–46.0)
Hemoglobin: 10.1 g/dL — ABNORMAL LOW (ref 12.0–15.0)
Immature Granulocytes: 0 %
Lymphocytes Relative: 18 %
Lymphs Abs: 1.2 10*3/uL (ref 0.7–4.0)
MCH: 28.5 pg (ref 26.0–34.0)
MCHC: 33.4 g/dL (ref 30.0–36.0)
MCV: 85.1 fL (ref 80.0–100.0)
Monocytes Absolute: 0.7 10*3/uL (ref 0.1–1.0)
Monocytes Relative: 10 %
Neutro Abs: 4.6 10*3/uL (ref 1.7–7.7)
Neutrophils Relative %: 70 %
Platelet Count: 599 10*3/uL — ABNORMAL HIGH (ref 150–400)
RBC: 3.55 MIL/uL — ABNORMAL LOW (ref 3.87–5.11)
RDW: 16 % — ABNORMAL HIGH (ref 11.5–15.5)
WBC Count: 6.6 10*3/uL (ref 4.0–10.5)
nRBC: 0 % (ref 0.0–0.2)

## 2022-08-30 LAB — CMP (CANCER CENTER ONLY)
ALT: 20 U/L (ref 0–44)
AST: 22 U/L (ref 15–41)
Albumin: 3.8 g/dL (ref 3.5–5.0)
Alkaline Phosphatase: 105 U/L (ref 38–126)
Anion gap: 6 (ref 5–15)
BUN: 20 mg/dL (ref 8–23)
CO2: 28 mmol/L (ref 22–32)
Calcium: 9.2 mg/dL (ref 8.9–10.3)
Chloride: 106 mmol/L (ref 98–111)
Creatinine: 1.13 mg/dL — ABNORMAL HIGH (ref 0.44–1.00)
GFR, Estimated: 51 mL/min — ABNORMAL LOW (ref >60)
Glucose, Bld: 176 mg/dL — ABNORMAL HIGH (ref 70–99)
Potassium: 4.3 mmol/L (ref 3.5–5.1)
Sodium: 140 mmol/L (ref 135–145)
Total Bilirubin: 0.5 mg/dL (ref 0.3–1.2)
Total Protein: 6.7 g/dL (ref 6.5–8.1)

## 2022-08-30 MED ORDER — SODIUM CHLORIDE 0.9% FLUSH
10.0000 mL | INTRAVENOUS | Status: DC | PRN
  Administered 2022-08-30: 10 mL

## 2022-08-30 MED ORDER — SODIUM CHLORIDE 0.9 % IV SOLN: Freq: Once | INTRAVENOUS | Status: AC

## 2022-08-30 MED ORDER — PALONOSETRON HCL INJECTION 0.25 MG/5ML
0.2500 mg | Freq: Once | INTRAVENOUS | Status: AC
  Administered 2022-08-30: 0.25 mg via INTRAVENOUS
  Filled 2022-08-30: qty 5

## 2022-08-30 MED ORDER — SODIUM CHLORIDE 0.9 % IV SOLN
60.0000 mg/m2 | Freq: Once | INTRAVENOUS | Status: AC
  Administered 2022-08-30: 99 mg via INTRAVENOUS
  Filled 2022-08-30: qty 9.9

## 2022-08-30 MED ORDER — SODIUM CHLORIDE 0.9 % IV SOLN
500.0000 mg/m2 | Freq: Once | INTRAVENOUS | Status: AC
  Administered 2022-08-30: 820 mg via INTRAVENOUS
  Filled 2022-08-30: qty 41

## 2022-08-30 MED ORDER — HEPARIN SOD (PORK) LOCK FLUSH 100 UNIT/ML IV SOLN
500.0000 [IU] | Freq: Once | INTRAVENOUS | Status: AC | PRN
  Administered 2022-08-30: 500 [IU]

## 2022-08-30 MED ORDER — SODIUM CHLORIDE 0.9% FLUSH
10.0000 mL | Freq: Once | INTRAVENOUS | Status: AC
  Administered 2022-08-30: 10 mL

## 2022-08-30 MED ORDER — SODIUM CHLORIDE 0.9 % IV SOLN
10.0000 mg | Freq: Once | INTRAVENOUS | Status: AC
  Administered 2022-08-30: 10 mg via INTRAVENOUS
  Filled 2022-08-30: qty 10

## 2022-08-30 NOTE — Assessment & Plan Note (Addendum)
05/18/2022: Screening mammogram detected left breast mass by ultrasound it measured 9 mm, biopsy revealed grade 2 IDC ER 90%, PR 15%, Ki-67 50%, HER2 2+ by IHC, FISH negative ratio 2.17, Copy #3.9, additional 20 cells were examined ratio came at 2.19 and copy #3.72    06/21/2022: Left lumpectomy: Grade 3 IDC 1.5 cm, margins negative, no lymph node (adipose tissue was in the sentinel node) ER 90% weak to moderate, PR 15% weak to strong, HER2 negative, Ki-67 50% Repeat prognostic panel: ER 0%, PR 0%, Ki-67 35%, HER2 2+ by IHC FISH negative ratio 1.72 (triple negative breast cancer)   Treatment plan: Oncotype DX testing was canceled because the cancer was triple negative.  Recommend adjuvant chemotherapy with Taxotere and Cytoxan every 3 weeks x 4 Adjuvant radiation therapy followed by Adjuvant antiestrogen therapy -----------------------------------------------------------------------------------------------------------------------------------  Current treatment: Cycle 2 Taxotere and Cytoxan Chemo toxicities: Bone pain from Neulasta Baseline peripheral neuropathy: Monitoring  Return to clinic in 3 weeks for cycle 3 (we decided to only treat her with 3 cycles because of neuropathy concerns)

## 2022-08-30 NOTE — Patient Instructions (Signed)
[place] [facility]  Discharge Instructions: Thank you for choosing [facility] to provide your oncology and hematology care.   If you have a lab appointment with the Cancer Center, please go directly to the Cancer Center and check in at the registration area.   Wear comfortable clothing and clothing appropriate for easy access to any Portacath or PICC line.   We strive to give you quality time with your provider. You may need to reschedule your appointment if you arrive late (15 or more minutes).  Arriving late affects you and other patients whose appointments are after yours.  Also, if you miss three or more appointments without notifying the office, you may be dismissed from the clinic at the provider's discretion.      For prescription refill requests, have your pharmacy contact our office and allow 72 hours for refills to be completed.    Today you received the following chemotherapy and/or immunotherapy agents: Docetaxel (Taxotere) and Cytoxan   To help prevent nausea and vomiting after your treatment, we encourage you to take your nausea medication as directed.  BELOW ARE SYMPTOMS THAT SHOULD BE REPORTED IMMEDIATELY: *FEVER GREATER THAN 100.4 F (38 C) OR HIGHER *CHILLS OR SWEATING *NAUSEA AND VOMITING THAT IS NOT CONTROLLED WITH YOUR NAUSEA MEDICATION *UNUSUAL SHORTNESS OF BREATH *UNUSUAL BRUISING OR BLEEDING *URINARY PROBLEMS (pain or burning when urinating, or frequent urination) *BOWEL PROBLEMS (unusual diarrhea, constipation, pain near the anus) TENDERNESS IN MOUTH AND THROAT WITH OR WITHOUT PRESENCE OF ULCERS (sore throat, sores in mouth, or a toothache) UNUSUAL RASH, SWELLING OR PAIN  UNUSUAL VAGINAL DISCHARGE OR ITCHING   Items with * indicate a potential emergency and should be followed up as soon as possible or go to the Emergency Department if any problems should occur.  Please show the CHEMOTHERAPY ALERT CARD or IMMUNOTHERAPY  ALERT CARD at check-in to the Emergency Department and triage nurse.  Should you have questions after your visit or need to cancel or reschedule your appointment, please contact [place] [facility]  Dept: [phone]  and follow the prompts.  Office hours are 8:00 a.m. to 4:30 p.m. Monday - Friday. Please note that voicemails left after 4:00 p.m. may not be returned until the following business day.  We are closed weekends and major holidays. You have access to a nurse at all times for urgent questions. Please call the main number to the clinic Dept: [phone] and follow the prompts.   For any non-urgent questions, you may also contact your provider using MyChart. We now offer e-Visits for anyone 28 and older to request care online for non-urgent symptoms. For details visit [email or web address].   Also download the MyChart app! Go to the app store, search "MyChart", open the app, select [place], and log in with your MyChart username and password.

## 2022-09-01 ENCOUNTER — Inpatient Hospital Stay: Payer: Medicare Other

## 2022-09-01 VITALS — BP 136/67 | HR 82 | Temp 98.9°F | Resp 16

## 2022-09-01 DIAGNOSIS — Z79899 Other long term (current) drug therapy: Secondary | ICD-10-CM | POA: Diagnosis not present

## 2022-09-01 DIAGNOSIS — Z7984 Long term (current) use of oral hypoglycemic drugs: Secondary | ICD-10-CM | POA: Diagnosis not present

## 2022-09-01 DIAGNOSIS — Z5189 Encounter for other specified aftercare: Secondary | ICD-10-CM | POA: Diagnosis not present

## 2022-09-01 DIAGNOSIS — Z5111 Encounter for antineoplastic chemotherapy: Secondary | ICD-10-CM | POA: Diagnosis not present

## 2022-09-01 DIAGNOSIS — T451X5A Adverse effect of antineoplastic and immunosuppressive drugs, initial encounter: Secondary | ICD-10-CM | POA: Diagnosis not present

## 2022-09-01 DIAGNOSIS — G629 Polyneuropathy, unspecified: Secondary | ICD-10-CM | POA: Diagnosis not present

## 2022-09-01 DIAGNOSIS — Z7982 Long term (current) use of aspirin: Secondary | ICD-10-CM | POA: Diagnosis not present

## 2022-09-01 DIAGNOSIS — Z171 Estrogen receptor negative status [ER-]: Secondary | ICD-10-CM | POA: Diagnosis not present

## 2022-09-01 DIAGNOSIS — Z170 Estrogen receptor positive status [ER+]: Secondary | ICD-10-CM

## 2022-09-01 DIAGNOSIS — D6481 Anemia due to antineoplastic chemotherapy: Secondary | ICD-10-CM | POA: Diagnosis not present

## 2022-09-01 DIAGNOSIS — C50512 Malignant neoplasm of lower-outer quadrant of left female breast: Secondary | ICD-10-CM | POA: Diagnosis not present

## 2022-09-01 MED ORDER — PEGFILGRASTIM-CBQV 6 MG/0.6ML ~~LOC~~ SOSY
6.0000 mg | PREFILLED_SYRINGE | Freq: Once | SUBCUTANEOUS | Status: AC
  Administered 2022-09-01: 6 mg via SUBCUTANEOUS
  Filled 2022-09-01: qty 0.6

## 2022-09-02 ENCOUNTER — Ambulatory Visit: Payer: Medicare Other

## 2022-09-02 ENCOUNTER — Other Ambulatory Visit: Payer: Medicare Other

## 2022-09-02 ENCOUNTER — Ambulatory Visit: Payer: Medicare Other | Admitting: Hematology and Oncology

## 2022-09-05 ENCOUNTER — Ambulatory Visit: Payer: Medicare Other

## 2022-09-19 MED FILL — Dexamethasone Sodium Phosphate Inj 100 MG/10ML: INTRAMUSCULAR | Qty: 1 | Status: AC

## 2022-09-20 ENCOUNTER — Inpatient Hospital Stay: Payer: Medicare Other

## 2022-09-20 ENCOUNTER — Other Ambulatory Visit: Payer: Self-pay

## 2022-09-20 ENCOUNTER — Inpatient Hospital Stay (HOSPITAL_BASED_OUTPATIENT_CLINIC_OR_DEPARTMENT_OTHER): Payer: Medicare Other | Admitting: Hematology and Oncology

## 2022-09-20 VITALS — BP 153/64 | HR 101 | Temp 97.9°F | Resp 18 | Ht <= 58 in | Wt 150.4 lb

## 2022-09-20 VITALS — HR 89

## 2022-09-20 DIAGNOSIS — Z170 Estrogen receptor positive status [ER+]: Secondary | ICD-10-CM | POA: Diagnosis not present

## 2022-09-20 DIAGNOSIS — Z7982 Long term (current) use of aspirin: Secondary | ICD-10-CM | POA: Diagnosis not present

## 2022-09-20 DIAGNOSIS — Z5111 Encounter for antineoplastic chemotherapy: Secondary | ICD-10-CM | POA: Diagnosis not present

## 2022-09-20 DIAGNOSIS — Z7984 Long term (current) use of oral hypoglycemic drugs: Secondary | ICD-10-CM | POA: Diagnosis not present

## 2022-09-20 DIAGNOSIS — G629 Polyneuropathy, unspecified: Secondary | ICD-10-CM | POA: Diagnosis not present

## 2022-09-20 DIAGNOSIS — C50512 Malignant neoplasm of lower-outer quadrant of left female breast: Secondary | ICD-10-CM | POA: Diagnosis not present

## 2022-09-20 DIAGNOSIS — Z79899 Other long term (current) drug therapy: Secondary | ICD-10-CM | POA: Diagnosis not present

## 2022-09-20 DIAGNOSIS — Z5189 Encounter for other specified aftercare: Secondary | ICD-10-CM | POA: Diagnosis not present

## 2022-09-20 DIAGNOSIS — D6481 Anemia due to antineoplastic chemotherapy: Secondary | ICD-10-CM | POA: Diagnosis not present

## 2022-09-20 DIAGNOSIS — Z171 Estrogen receptor negative status [ER-]: Secondary | ICD-10-CM | POA: Diagnosis not present

## 2022-09-20 DIAGNOSIS — Z95828 Presence of other vascular implants and grafts: Secondary | ICD-10-CM

## 2022-09-20 DIAGNOSIS — T451X5A Adverse effect of antineoplastic and immunosuppressive drugs, initial encounter: Secondary | ICD-10-CM | POA: Diagnosis not present

## 2022-09-20 LAB — CBC WITH DIFFERENTIAL (CANCER CENTER ONLY)
Abs Immature Granulocytes: 0.03 10*3/uL (ref 0.00–0.07)
Basophils Absolute: 0.1 10*3/uL (ref 0.0–0.1)
Basophils Relative: 1 %
Eosinophils Absolute: 0 10*3/uL (ref 0.0–0.5)
Eosinophils Relative: 0 %
HCT: 29 % — ABNORMAL LOW (ref 36.0–46.0)
Hemoglobin: 9.6 g/dL — ABNORMAL LOW (ref 12.0–15.0)
Immature Granulocytes: 0 %
Lymphocytes Relative: 15 %
Lymphs Abs: 1.1 10*3/uL (ref 0.7–4.0)
MCH: 28.9 pg (ref 26.0–34.0)
MCHC: 33.1 g/dL (ref 30.0–36.0)
MCV: 87.3 fL (ref 80.0–100.0)
Monocytes Absolute: 0.7 10*3/uL (ref 0.1–1.0)
Monocytes Relative: 9 %
Neutro Abs: 5.4 10*3/uL (ref 1.7–7.7)
Neutrophils Relative %: 75 %
Platelet Count: 321 10*3/uL (ref 150–400)
RBC: 3.32 MIL/uL — ABNORMAL LOW (ref 3.87–5.11)
RDW: 18.8 % — ABNORMAL HIGH (ref 11.5–15.5)
WBC Count: 7.3 10*3/uL (ref 4.0–10.5)
nRBC: 0 % (ref 0.0–0.2)

## 2022-09-20 LAB — CMP (CANCER CENTER ONLY)
ALT: 30 U/L (ref 0–44)
AST: 27 U/L (ref 15–41)
Albumin: 3.9 g/dL (ref 3.5–5.0)
Alkaline Phosphatase: 106 U/L (ref 38–126)
Anion gap: 7 (ref 5–15)
BUN: 26 mg/dL — ABNORMAL HIGH (ref 8–23)
CO2: 27 mmol/L (ref 22–32)
Calcium: 9.1 mg/dL (ref 8.9–10.3)
Chloride: 104 mmol/L (ref 98–111)
Creatinine: 1.28 mg/dL — ABNORMAL HIGH (ref 0.44–1.00)
GFR, Estimated: 44 mL/min — ABNORMAL LOW (ref >60)
Glucose, Bld: 177 mg/dL — ABNORMAL HIGH (ref 70–99)
Potassium: 4.5 mmol/L (ref 3.5–5.1)
Sodium: 138 mmol/L (ref 135–145)
Total Bilirubin: 0.5 mg/dL (ref 0.3–1.2)
Total Protein: 6.4 g/dL — ABNORMAL LOW (ref 6.5–8.1)

## 2022-09-20 MED ORDER — SODIUM CHLORIDE 0.9 % IV SOLN: Freq: Once | INTRAVENOUS | Status: AC

## 2022-09-20 MED ORDER — SODIUM CHLORIDE 0.9 % IV SOLN
500.0000 mg/m2 | Freq: Once | INTRAVENOUS | Status: AC
  Administered 2022-09-20: 820 mg via INTRAVENOUS
  Filled 2022-09-20: qty 41

## 2022-09-20 MED ORDER — PALONOSETRON HCL INJECTION 0.25 MG/5ML
0.2500 mg | Freq: Once | INTRAVENOUS | Status: AC
  Administered 2022-09-20: 0.25 mg via INTRAVENOUS
  Filled 2022-09-20: qty 5

## 2022-09-20 MED ORDER — SODIUM CHLORIDE 0.9 % IV SOLN
60.0000 mg/m2 | Freq: Once | INTRAVENOUS | Status: AC
  Administered 2022-09-20: 99 mg via INTRAVENOUS
  Filled 2022-09-20: qty 9.9

## 2022-09-20 MED ORDER — HEPARIN SOD (PORK) LOCK FLUSH 100 UNIT/ML IV SOLN
500.0000 [IU] | Freq: Once | INTRAVENOUS | Status: AC | PRN
  Administered 2022-09-20: 500 [IU]

## 2022-09-20 MED ORDER — SODIUM CHLORIDE 0.9% FLUSH
10.0000 mL | Freq: Once | INTRAVENOUS | Status: AC
  Administered 2022-09-20: 10 mL

## 2022-09-20 MED ORDER — SODIUM CHLORIDE 0.9 % IV SOLN
10.0000 mg | Freq: Once | INTRAVENOUS | Status: AC
  Administered 2022-09-20: 10 mg via INTRAVENOUS
  Filled 2022-09-20: qty 10

## 2022-09-20 MED ORDER — SODIUM CHLORIDE 0.9% FLUSH
10.0000 mL | INTRAVENOUS | Status: DC | PRN
  Administered 2022-09-20: 10 mL

## 2022-09-20 NOTE — Progress Notes (Signed)
Patient Care Team: Dorothyann Peng, MD as PCP - General (Internal Medicine) Harlan Stains, [facility] (Pharmacist) Serena Croissant, MD as Consulting Physician (Hematology and Oncology) Donnelly Angelica, RN as Oncology Nurse Navigator Pershing Proud, RN as Oncology Nurse Navigator Harriette Bouillon, MD as Consulting Physician (General Surgery) Dorothy Puffer, MD as Consulting Physician (Radiation Oncology)  DIAGNOSIS:  Encounter Diagnosis  Name Primary?   Malignant neoplasm of lower-outer quadrant of left breast of female, estrogen receptor positive (HCC) Yes    SUMMARY OF ONCOLOGIC HISTORY: Oncology History  Malignant neoplasm of lower-outer quadrant of left breast of female, estrogen receptor positive (HCC)  05/18/2022 Initial Diagnosis   05/18/2022: Screening mammogram detected left breast mass by ultrasound it measured 9 mm, biopsy revealed grade 2 IDC ER 90%, PR 15%, Ki-67 50%, HER2 2+ by IHC, FISH negative ratio 2.17, Copy #3.9, additional 20 cells were examined ratio came at 2.19 and copy #3.72   06/05/2022 Cancer Staging   Staging form: Breast, AJCC 8th Edition - Clinical: Stage IA (cT1b, cN0, cM0, G2, ER+, PR+, HER2-) - Signed by Ronny Bacon, PA-C on 06/05/2022 Method of lymph node assessment: Clinical Histologic grading system: 3 grade system   06/21/2022 Surgery   Left lumpectomy: Grade 3 IDC 1.5 cm, margins negative, no lymph node (adipose tissue was in the sentinel node) ER 90% weak to moderate, PR 15% weak to strong, HER2 negative, Ki-67 50% Repeat prognostic panel: ER 0%, PR 0%, Ki-67 35%, HER2 2+ by IHC FISH negative ratio 1.72 (triple negative breast cancer)   06/29/2022 Cancer Staging   Staging form: Breast, AJCC 8th Edition - Pathologic: Stage IA (pT1c, pN0, cM0, G3, ER+, PR+, HER2-) - Signed by Serena Croissant, MD on 06/29/2022 Histologic grading system: 3 grade system   08/09/2022 -  Chemotherapy   Patient is on Treatment Plan : BREAST TC q21d       CHIEF  COMPLIANT: Cycle 3 Taxotere and Cytoxan   INTERVAL HISTORY: Nicole Nunez is a 77 y.o. female is here because of above-mentioned history of left breast cancer she underwent lumpectomy and apparently prognostic panel came back as triple negative disease. She presents to the clinic for a follow-up.  She reports treatment went fine with no side effects or concerns. She says that neuropathy is still the same. She denies any bone pain.  ALLERGIES:  is allergic to statins.  MEDICATIONS:  Current Outpatient Medications  Medication Sig Dispense Refill   Ascorbic Acid (VITAMIN C) 1000 MG tablet Take 1,000 mg by mouth daily.     aspirin EC 81 MG tablet Take 81 mg by mouth daily.     b complex vitamins tablet Take 1 tablet by mouth daily.     Calcium Carbonate (CALCIUM 600 PO) Take 600 mg by mouth daily.     Cholecalciferol (VITAMIN D3) 125 MCG (5000 UT) CAPS Take 5,000 Units by mouth daily.     dapagliflozin propanediol (FARXIGA) 10 MG TABS tablet Take 1 tablet (10 mg total) by mouth daily. 90 tablet 2   fluconazole (DIFLUCAN) 100 MG tablet Take 1 tablet (100 mg total) by mouth daily. 3 tablet 0   Lancet Devices (ONE TOUCH DELICA LANCING DEV) MISC USE AS DIRECTED TO CHECK BLOOD SUGARS ONCE DAILY dx: e11.22 1 each 2   loratadine (CLARITIN) 10 MG tablet TAKE 1 TABLET BY MOUTH EVERY DAY 30 tablet 2   magnesium gluconate (MAGONATE) 500 MG tablet Take 500 mg by mouth daily.     Multiple Vitamin (  MULTIVITAMIN) tablet Take 1 tablet by mouth daily.     olmesartan (BENICAR) 20 MG tablet Take 1 tablet (20 mg total) by mouth daily. 100 tablet 2   ONETOUCH VERIO test strip USE TO CHECK BLOOD SUGAR DAILY 100 strip 2   Pitavastatin Magnesium 4 MG TABS Take 1 tablet by mouth daily. 30 tablet 11   Semaglutide,0.25 or 0.5MG /DOS, (OZEMPIC, 0.25 OR 0.5 MG/DOSE,) 2 MG/3ML SOPN Inject 0.5mg  SQ weekly. 3 mL 0   zinc gluconate 50 MG tablet Take 50 mg by mouth daily.     No current facility-administered medications for  this visit.    PHYSICAL EXAMINATION: ECOG PERFORMANCE STATUS: 1 - Symptomatic but completely ambulatory  Vitals:   09/20/22 0944  BP: (!) 153/64  Pulse: (!) 101  Resp: 18  Temp: 97.9 F (36.6 C)  SpO2: 100%   Filed Weights   09/20/22 0944  Weight: 150 lb 6.4 oz (68.2 kg)      LABORATORY DATA:  I have reviewed the data as listed    Latest Ref Rng & Units 08/30/2022    9:02 AM 08/16/2022   10:12 AM 08/09/2022   10:40 AM  CMP  Glucose 70 - 99 mg/dL 161  096  045   BUN 8 - 23 mg/dL 20  18  18    Creatinine 0.44 - 1.00 mg/dL 4.09  8.11  9.14   Sodium 135 - 145 mmol/L 140  140  141   Potassium 3.5 - 5.1 mmol/L 4.3  4.0  4.0   Chloride 98 - 111 mmol/L 106  104  105   CO2 22 - 32 mmol/L 28  30  31    Calcium 8.9 - 10.3 mg/dL 9.2  9.2  9.5   Total Protein 6.5 - 8.1 g/dL 6.7  6.6  6.7   Total Bilirubin 0.3 - 1.2 mg/dL 0.5  0.4  0.5   Alkaline Phos 38 - 126 U/L 105  102  95   AST 15 - 41 U/L 22  22  23    ALT 0 - 44 U/L 20  18  16      Lab Results  Component Value Date   WBC 7.3 09/20/2022   HGB 9.6 (L) 09/20/2022   HCT 29.0 (L) 09/20/2022   MCV 87.3 09/20/2022   PLT 321 09/20/2022   NEUTROABS 5.4 09/20/2022    ASSESSMENT & PLAN:  Malignant neoplasm of lower-outer quadrant of left breast of female, estrogen receptor positive (HCC) 05/18/2022: Screening mammogram detected left breast mass by ultrasound it measured 9 mm, biopsy revealed grade 2 IDC ER 90%, PR 15%, Ki-67 50%, HER2 2+ by IHC, FISH negative ratio 2.17, Copy #3.9, additional 20 cells were examined ratio came at 2.19 and copy #3.72    06/21/2022: Left lumpectomy: Grade 3 IDC 1.5 cm, margins negative, no lymph node (adipose tissue was in the sentinel node) ER 90% weak to moderate, PR 15% weak to strong, HER2 negative, Ki-67 50% Repeat prognostic panel: ER 0%, PR 0%, Ki-67 35%, HER2 2+ by IHC FISH negative ratio 1.72 (triple negative breast cancer)   Treatment plan: Oncotype DX testing was canceled because the cancer  was triple negative.  Recommend adjuvant chemotherapy with Taxotere and Cytoxan every 3 weeks x 3 Adjuvant radiation therapy followed by Adjuvant antiestrogen therapy -----------------------------------------------------------------------------------------------------------------------------------  Current treatment: Cycle 3 Taxotere and Cytoxan Chemo toxicities: Baseline peripheral neuropathy: Stable Chemotherapy-induced anemia: Today's hemoglobin is 9.6.  We are watching closely.  No change in the treatment dose today.  Based on pre-existing neuropathy, we recommended discontinuing chemo after this cycle. I will refer the patient to adjuvant radiation. Return to clinic after radiation to discuss starting antiestrogen therapy. Requested Dr. Luisa Hart remove her port.  No orders of the defined types were placed in this encounter.  The patient has a good understanding of the overall plan. she agrees with it. she will call with any problems that may develop before the next visit here. Total time spent: 30 mins including face to face time and time spent for planning, charting and co-ordination of care   Tamsen Meek, MD 09/20/22    I Janan Ridge am acting as a Neurosurgeon for [employer]  I have reviewed the above documentation for accuracy and completeness, and I agree with the above.

## 2022-09-20 NOTE — Assessment & Plan Note (Signed)
05/18/2022: Screening mammogram detected left breast mass by ultrasound it measured 9 mm, biopsy revealed grade 2 IDC ER 90%, PR 15%, Ki-67 50%, HER2 2+ by IHC, FISH negative ratio 2.17, Copy #3.9, additional 20 cells were examined ratio came at 2.19 and copy #3.72    06/21/2022: Left lumpectomy: Grade 3 IDC 1.5 cm, margins negative, no lymph node (adipose tissue was in the sentinel node) ER 90% weak to moderate, PR 15% weak to strong, HER2 negative, Ki-67 50% Repeat prognostic panel: ER 0%, PR 0%, Ki-67 35%, HER2 2+ by IHC FISH negative ratio 1.72 (triple negative breast cancer)   Treatment plan: Oncotype DX testing was canceled because the cancer was triple negative.  Recommend adjuvant chemotherapy with Taxotere and Cytoxan every 3 weeks x 3 Adjuvant radiation therapy followed by Adjuvant antiestrogen therapy -----------------------------------------------------------------------------------------------------------------------------------  Current treatment: Cycle 3 Taxotere and Cytoxan Chemo toxicities: Bone pain from Neulasta Baseline peripheral neuropathy: Monitoring Chemotherapy-induced anemia: Today's hemoglobin is 10.1.  We are watching closely.  No change in the treatment dose today.   Based on pre-existing neuropathy, we recommended discontinuing chemo after this cycle. I will refer the patient to adjuvant radiation. Return to clinic after radiation to discuss starting antiestrogen therapy.

## 2022-09-21 ENCOUNTER — Ambulatory Visit: Payer: Self-pay | Admitting: Surgery

## 2022-09-22 ENCOUNTER — Inpatient Hospital Stay: Payer: Medicare Other | Attending: Hematology and Oncology

## 2022-09-22 VITALS — BP 125/63 | HR 92 | Temp 98.6°F | Resp 16

## 2022-09-22 DIAGNOSIS — C50212 Malignant neoplasm of upper-inner quadrant of left female breast: Secondary | ICD-10-CM | POA: Insufficient documentation

## 2022-09-22 DIAGNOSIS — C50512 Malignant neoplasm of lower-outer quadrant of left female breast: Secondary | ICD-10-CM | POA: Diagnosis present

## 2022-09-22 DIAGNOSIS — Z5189 Encounter for other specified aftercare: Secondary | ICD-10-CM | POA: Diagnosis not present

## 2022-09-22 DIAGNOSIS — Z170 Estrogen receptor positive status [ER+]: Secondary | ICD-10-CM | POA: Insufficient documentation

## 2022-09-22 MED ORDER — PEGFILGRASTIM-CBQV 6 MG/0.6ML ~~LOC~~ SOSY
6.0000 mg | PREFILLED_SYRINGE | Freq: Once | SUBCUTANEOUS | Status: AC
  Administered 2022-09-22: 6 mg via SUBCUTANEOUS
  Filled 2022-09-22: qty 0.6

## 2022-09-23 ENCOUNTER — Other Ambulatory Visit: Payer: Medicare Other

## 2022-09-23 ENCOUNTER — Ambulatory Visit: Payer: Medicare Other | Admitting: Hematology and Oncology

## 2022-09-23 ENCOUNTER — Ambulatory Visit: Payer: Medicare Other

## 2022-09-26 ENCOUNTER — Ambulatory Visit: Payer: Medicare Other

## 2022-10-04 ENCOUNTER — Telehealth: Payer: Self-pay

## 2022-10-04 NOTE — Progress Notes (Signed)
Patient brought in updated income, faxing to [employer] patient assistance.   Billee Cashing, CMA Clinical Pharmacist Assistant [phone]

## 2022-10-06 DIAGNOSIS — E113211 Type 2 diabetes mellitus with mild nonproliferative diabetic retinopathy with macular edema, right eye: Secondary | ICD-10-CM | POA: Diagnosis not present

## 2022-10-06 DIAGNOSIS — H43821 Vitreomacular adhesion, right eye: Secondary | ICD-10-CM | POA: Diagnosis not present

## 2022-10-06 DIAGNOSIS — E113292 Type 2 diabetes mellitus with mild nonproliferative diabetic retinopathy without macular edema, left eye: Secondary | ICD-10-CM | POA: Diagnosis not present

## 2022-10-06 DIAGNOSIS — Z961 Presence of intraocular lens: Secondary | ICD-10-CM | POA: Diagnosis not present

## 2022-10-06 LAB — HM DIABETES EYE EXAM

## 2022-10-07 ENCOUNTER — Encounter: Payer: Self-pay | Admitting: *Deleted

## 2022-10-10 ENCOUNTER — Telehealth: Payer: Self-pay | Admitting: *Deleted

## 2022-10-10 NOTE — Telephone Encounter (Signed)
Received after hours message from pt with complaint of itchy rash all over her body composed of little bumps.  After hours provider instructed pt to take OTC benadryl and apply OTC hydrocortisone cream.  RN placed call to f/u, pt states symptoms are alleviating with OTC treatment.  Pt instructed to f/u with PCP if symptoms resume considering pt last chemo tx 09/20/22.  Pt verbalized understanding.

## 2022-10-10 NOTE — Progress Notes (Signed)
Radiation Oncology         ([phone]) [phone] ________________________________  Name: Nicole Nunez        MRN: [id number]  Date of Service: 10/11/2022 DOB: 04/28/1946  BM:[id number], Melina Schools, MD  Serena Croissant, MD     REFERRING PHYSICIAN: Serena Croissant, MD   DIAGNOSIS: The encounter diagnosis was Malignant neoplasm of lower-outer quadrant of left breast of female, estrogen receptor positive (HCC).   HISTORY OF PRESENT ILLNESS: Nicole Nunez is a 77 y.o. female  with a diagnosis of left breast cancer. The patient was noted to have screening detected mass in the left breast at the 6 o'clock position.  By ultrasound it measured 9 mm in greatest dimension, and no evidence of adenopathy was appreciated.  She underwent a biopsy on 05/18/2022 that showed a grade 2 invasive ductal carcinoma that was ER/PR positive HER2 was negative with a Ki-67 of 50%.    Since her last visit, she underwent a lumpectomy with sentinel lymph node biopsy on 06/21/2022 with Dr. Luisa Hart. This showed a grade 3 invasive ductal carcinoma measuring 15 mm in greatest dimension.  Margins were all negative, the closest being 3 mm to the superior margin, 1 lymph node specimen was submitted and contained adipose tissue with no lymphatic tissue identified.  Her tumor was retested and felt to be triple negative. She did go back for a second surgery to sample her lymph nodes again which were performed on 07/21/2022 and a single benign appearing lymph node was identified.  Interestingly prognostic panel came back as triple negative disease, and she followed up with Dr. Pamelia Hoit to discuss these results.  She began systemic chemotherapy on 07/19/2022 after her third cycle, her treatment was discontinued due to neuropathy.  She is seen today to discuss adjuvant radiotherapy to the left breast.  PREVIOUS RADIATION THERAPY: No   PAST MEDICAL HISTORY:  Past Medical History:  Diagnosis Date   Arthritis    Breast cancer (HCC) 05/18/2022   CKD  stage 3 due to type 2 diabetes mellitus (HCC)    DM (diabetes mellitus) (HCC)    GERD (gastroesophageal reflux disease)    High cholesterol    HTN (hypertension)        PAST SURGICAL HISTORY: Past Surgical History:  Procedure Laterality Date   BREAST BIOPSY Left 05/18/2022   [place] LT BREAST BX W LOC DEV 1ST LESION IMG BX SPEC US GUIDE 05/18/2022 [facility]   BREAST BIOPSY Left 06/20/2022   [place] LT RADIOACTIVE SEED LOC 06/20/2022 [facility]   BREAST LUMPECTOMY WITH RADIOACTIVE SEED AND AXILLARY LYMPH NODE DISSECTION Left 06/21/2022   Procedure: LEFT BREAST LUMPECTOMY WITH RADIOACTIVE SEED AND AXILLARY NODE DISSECTION;  Surgeon: Harriette Bouillon, MD;  Location: [facility];  Service: General;  Laterality: Left;   CATARACT EXTRACTION, BILATERAL  01/2018   first was 12/2017   corn removal Bilateral 06/2017   EYE SURGERY     PORTACATH PLACEMENT N/A 07/21/2022   Procedure: INSERTION PORT-A-CATH;  Surgeon: Harriette Bouillon, MD;  Location: [facility] OR;  Service: General;  Laterality: N/A;  90 MIN ROOM 4-OK'D PER KELLY   SENTINEL NODE BIOPSY Left 07/21/2022   Procedure: LEFT SENTINEL NODE BIOPSY;  Surgeon: Harriette Bouillon, MD;  Location: [facility] OR;  Service: General;  Laterality: Left;     FAMILY HISTORY:  Family History  Problem Relation Age of Onset   Asthma Mother    Hypertension Mother    Alzheimer's disease Mother    Early death Father  SOCIAL HISTORY:  reports that she has never smoked. She has never used smokeless tobacco. She reports that she does not drink alcohol and does not use drugs.  The patient is widowed and lives in [place].    ALLERGIES: Statins   MEDICATIONS:  Current Outpatient Medications  Medication Sig Dispense Refill   Ascorbic Acid (VITAMIN C) 1000 MG tablet Take 1,000 mg by mouth daily.     aspirin EC 81 MG tablet Take 81 mg by mouth daily.     b complex vitamins tablet Take 1 tablet by mouth daily.     Calcium Carbonate (CALCIUM  600 PO) Take 600 mg by mouth daily.     Cholecalciferol (VITAMIN D3) 125 MCG (5000 UT) CAPS Take 5,000 Units by mouth daily.     dapagliflozin propanediol (FARXIGA) 10 MG TABS tablet Take 1 tablet (10 mg total) by mouth daily. 90 tablet 2   fluconazole (DIFLUCAN) 100 MG tablet Take 1 tablet (100 mg total) by mouth daily. 3 tablet 0   Lancet Devices (ONE TOUCH DELICA LANCING DEV) MISC USE AS DIRECTED TO CHECK BLOOD SUGARS ONCE DAILY dx: e11.22 1 each 2   loratadine (CLARITIN) 10 MG tablet TAKE 1 TABLET BY MOUTH EVERY DAY 30 tablet 2   magnesium gluconate (MAGONATE) 500 MG tablet Take 500 mg by mouth daily.     Multiple Vitamin (MULTIVITAMIN) tablet Take 1 tablet by mouth daily.     olmesartan (BENICAR) 20 MG tablet Take 1 tablet (20 mg total) by mouth daily. 100 tablet 2   ONETOUCH VERIO test strip USE TO CHECK BLOOD SUGAR DAILY 100 strip 2   Pitavastatin Magnesium 4 MG TABS Take 1 tablet by mouth daily. 30 tablet 11   Semaglutide,0.25 or 0.5MG /DOS, (OZEMPIC, 0.25 OR 0.5 MG/DOSE,) 2 MG/3ML SOPN Inject 0.5mg  SQ weekly. 3 mL 0   zinc gluconate 50 MG tablet Take 50 mg by mouth daily.     No current facility-administered medications for this visit.     REVIEW OF SYSTEMS: On review of systems, the patient reports that she is doing well since completing her last course of chemotherapy. She did change her detergent with her laundry and has noticed a rash since last Thursday when she started using this. She's taking benadryl and using hydrocortisone. No other complaints are verbalized.     PHYSICAL EXAM:  Wt Readings from Last 3 Encounters:  09/20/22 150 lb 6.4 oz (68.2 kg)  08/30/22 150 lb 3.2 oz (68.1 kg)  08/16/22 147 lb 12.8 oz (67 kg)   Temp Readings from Last 3 Encounters:  09/22/22 98.6 F (37 C) (Oral)  09/20/22 97.9 F (36.6 C) (Temporal)  09/01/22 98.9 F (37.2 C) (Oral)   BP Readings from Last 3 Encounters:  09/22/22 125/63  09/20/22 (!) 153/64  09/01/22 136/67   Pulse  Readings from Last 3 Encounters:  09/22/22 92  09/20/22 89  09/20/22 (!) 101    In general this is a well appearing African American female in no acute distress. She's alert and oriented x4 and appropriate throughout the examination. Cardiopulmonary assessment is negative for acute distress and she exhibits normal effort. The left breast incision site is well healed without erythema, separation, or drainage. She does have a papular rash that is erythematous over the forearms and posterior trunk.     ECOG = 1  0 - Asymptomatic (Fully active, able to carry on all predisease activities without restriction)  1 - Symptomatic but completely ambulatory (Restricted in physically strenuous  activity but ambulatory and able to carry out work of a light or sedentary nature. For example, light housework, office work)  2 - Symptomatic, <50% in bed during the day (Ambulatory and capable of all self care but unable to carry out any work activities. Up and about more than 50% of waking hours)  3 - Symptomatic, >50% in bed, but not bedbound (Capable of only limited self-care, confined to bed or chair 50% or more of waking hours)  4 - Bedbound (Completely disabled. Cannot carry on any self-care. Totally confined to bed or chair)  5 - Death   Santiago Glad MM, Creech RH, Tormey DC, et al. [phone]). "Toxicity and response criteria of the [facility]". Am. Evlyn Clines. Oncol. 5 (6): 649-55    LABORATORY DATA:  Lab Results  Component Value Date   WBC 7.3 09/20/2022   HGB 9.6 (L) 09/20/2022   HCT 29.0 (L) 09/20/2022   MCV 87.3 09/20/2022   PLT 321 09/20/2022   Lab Results  Component Value Date   NA 138 09/20/2022   K 4.5 09/20/2022   CL 104 09/20/2022   CO2 27 09/20/2022   Lab Results  Component Value Date   ALT 30 09/20/2022   AST 27 09/20/2022   ALKPHOS 106 09/20/2022   BILITOT 0.5 09/20/2022      RADIOGRAPHY: No results found.     IMPRESSION/PLAN: 1. Stage IB, pT1cN0M0,  grade 3, triple negative invasive ductal carcinoma of the left breast. Dr. Mitzi Hansen has reviewed her final pathology results. She has done well since surgery and has completed the course of chemotherapy she could tolerate. We reviewed the rationale for external radiotherapy to the breast  to reduce risks of local recurrence. We discussed the risks, benefits, short, and long term effects of radiotherapy, as well as the curative intent, and the patient is interested in proceeding. We reviewed the delivery and logistics of radiotherapy and that Dr. Mitzi Hansen recommends 4 weeks of radiotherapy to the left breast with deep inspiration breath hold technique. Written consent is obtained and placed in the chart, a copy was provided to the patient. She will simulate today. 2. Likely contact dermatitis. I encouraged her to also consider Histamine blockade as well with OTC Zantac or Tagamet in addition to benadryl and hydrocortisone. If this is not improved, she will call us back.      In a visit lasting 60 minutes, greater than 50% of the time was spent face to face reviewing her case, as well as in preparation of, discussing, and coordinating the patient's care.  The above documentation reflects my direct findings during this shared patient visit. Please see the separate note by Dr. Mitzi Hansen on this date for the remainder of the patient's plan of care.    Osker Mason, [facility]    **Disclaimer: This note was dictated with voice recognition software. Similar sounding words can inadvertently be transcribed and this note may contain transcription errors which may not have been corrected upon publication of note.**

## 2022-10-11 ENCOUNTER — Ambulatory Visit
Admission: RE | Admit: 2022-10-11 | Discharge: 2022-10-11 | Disposition: A | Discharge status: Home / Self Care | Payer: Medicare Other | Source: Ambulatory Visit | Attending: Radiation Oncology | Admitting: Radiation Oncology

## 2022-10-11 ENCOUNTER — Encounter: Payer: Self-pay | Admitting: Radiation Oncology

## 2022-10-11 VITALS — BP 133/70 | HR 95 | Temp 97.5°F | Resp 18 | Ht <= 58 in | Wt 150.2 lb

## 2022-10-11 DIAGNOSIS — E119 Type 2 diabetes mellitus without complications: Secondary | ICD-10-CM | POA: Diagnosis not present

## 2022-10-11 DIAGNOSIS — Z510 Encounter for antineoplastic radiation therapy: Secondary | ICD-10-CM | POA: Insufficient documentation

## 2022-10-11 DIAGNOSIS — K219 Gastro-esophageal reflux disease without esophagitis: Secondary | ICD-10-CM | POA: Insufficient documentation

## 2022-10-11 DIAGNOSIS — I129 Hypertensive chronic kidney disease with stage 1 through stage 4 chronic kidney disease, or unspecified chronic kidney disease: Secondary | ICD-10-CM | POA: Insufficient documentation

## 2022-10-11 DIAGNOSIS — C50512 Malignant neoplasm of lower-outer quadrant of left female breast: Secondary | ICD-10-CM | POA: Insufficient documentation

## 2022-10-11 DIAGNOSIS — Z171 Estrogen receptor negative status [ER-]: Secondary | ICD-10-CM | POA: Insufficient documentation

## 2022-10-11 DIAGNOSIS — G629 Polyneuropathy, unspecified: Secondary | ICD-10-CM | POA: Insufficient documentation

## 2022-10-11 DIAGNOSIS — Z7984 Long term (current) use of oral hypoglycemic drugs: Secondary | ICD-10-CM | POA: Diagnosis not present

## 2022-10-11 DIAGNOSIS — J449 Chronic obstructive pulmonary disease, unspecified: Secondary | ICD-10-CM | POA: Insufficient documentation

## 2022-10-11 DIAGNOSIS — N183 Chronic kidney disease, stage 3 unspecified: Secondary | ICD-10-CM | POA: Insufficient documentation

## 2022-10-11 DIAGNOSIS — Z170 Estrogen receptor positive status [ER+]: Secondary | ICD-10-CM

## 2022-10-11 DIAGNOSIS — Z7982 Long term (current) use of aspirin: Secondary | ICD-10-CM | POA: Diagnosis not present

## 2022-10-11 DIAGNOSIS — E7800 Pure hypercholesterolemia, unspecified: Secondary | ICD-10-CM | POA: Diagnosis not present

## 2022-10-11 NOTE — Progress Notes (Signed)
Nursing interview for a 77yr old female w/ Malignant neoplasm of lower-outer quadrant of left breast of female, estrogen receptor positive (HCC). Patient identity verified x2.  Patient reports a full body rash x4 days. No conveyed issues reported at this time.  Meaningful use complete. Post menopausal.  BP 133/70 (BP Location: Right Arm, Patient Position: Sitting, Cuff Size: Normal)   Pulse 95   Temp (!) 97.5 F (36.4 C) (Oral)   Resp 18   Ht 4\' 10"  (1.473 m)   Wt 150 lb 3.2 oz (68.1 kg)   SpO2 99%   BMI 31.39 kg/m   This concludes the interview.   Ruel Favors, LPN

## 2022-10-13 DIAGNOSIS — L84 Corns and callosities: Secondary | ICD-10-CM | POA: Diagnosis not present

## 2022-10-13 DIAGNOSIS — M79675 Pain in left toe(s): Secondary | ICD-10-CM | POA: Diagnosis not present

## 2022-10-13 DIAGNOSIS — E118 Type 2 diabetes mellitus with unspecified complications: Secondary | ICD-10-CM | POA: Diagnosis not present

## 2022-10-13 DIAGNOSIS — B351 Tinea unguium: Secondary | ICD-10-CM | POA: Diagnosis not present

## 2022-10-18 ENCOUNTER — Encounter: Payer: Self-pay | Admitting: *Deleted

## 2022-10-18 ENCOUNTER — Telehealth: Payer: Self-pay | Admitting: Hematology and Oncology

## 2022-10-18 DIAGNOSIS — Z170 Estrogen receptor positive status [ER+]: Secondary | ICD-10-CM

## 2022-10-18 NOTE — Telephone Encounter (Signed)
Scheduled appointment per scheduling message. Left voicemail.  

## 2022-10-19 DIAGNOSIS — E785 Hyperlipidemia, unspecified: Secondary | ICD-10-CM | POA: Diagnosis not present

## 2022-10-20 ENCOUNTER — Telehealth: Payer: Self-pay

## 2022-10-20 LAB — NMR, LIPOPROFILE
Cholesterol, Total: 134 mg/dL (ref 100–199)
HDL Particle Number: 33 umol/L (ref >=30.5)
HDL-C: 51 mg/dL (ref >39)
LDL Particle Number: 939 nmol/L (ref <1000)
LDL Size: 20.4 nm — ABNORMAL LOW (ref >20.5)
LDL-C (NIH Calc): 69 mg/dL (ref 0–99)
LP-IR Score: 30 (ref <=45)
Small LDL Particle Number: 408 nmol/L (ref <=527)
Triglycerides: 69 mg/dL (ref 0–149)

## 2022-10-20 NOTE — Telephone Encounter (Signed)
Pt called regarding darkening of her nails. Advised Pt that nail darkening is a typical side effect of taxotere. Discussed with Pt that palms could appear darker as well and nails could start to peel. If nails start to peel, Pt can mix 2 C warm water with 1 drop liquid Dial gold antibacterial soap and 1 drop of tea tree oil, soaking twice a day to prevent infection. Pt verbalized understanding.

## 2022-10-21 DIAGNOSIS — Z510 Encounter for antineoplastic radiation therapy: Secondary | ICD-10-CM | POA: Diagnosis not present

## 2022-10-21 DIAGNOSIS — Z170 Estrogen receptor positive status [ER+]: Secondary | ICD-10-CM | POA: Diagnosis not present

## 2022-10-21 DIAGNOSIS — C50512 Malignant neoplasm of lower-outer quadrant of left female breast: Secondary | ICD-10-CM | POA: Diagnosis not present

## 2022-10-24 ENCOUNTER — Encounter (HOSPITAL_BASED_OUTPATIENT_CLINIC_OR_DEPARTMENT_OTHER): Payer: Self-pay | Admitting: Surgery

## 2022-10-24 ENCOUNTER — Other Ambulatory Visit: Payer: Self-pay

## 2022-10-24 ENCOUNTER — Ambulatory Visit (INDEPENDENT_AMBULATORY_CARE_PROVIDER_SITE_OTHER): Payer: Medicare Other | Admitting: Internal Medicine

## 2022-10-24 ENCOUNTER — Other Ambulatory Visit: Payer: Self-pay | Admitting: Internal Medicine

## 2022-10-24 ENCOUNTER — Encounter: Payer: Self-pay | Admitting: Internal Medicine

## 2022-10-24 VITALS — BP 130/74 | HR 81 | Temp 97.8°F | Ht <= 58 in | Wt 146.8 lb

## 2022-10-24 DIAGNOSIS — E7800 Pure hypercholesterolemia, unspecified: Secondary | ICD-10-CM

## 2022-10-24 DIAGNOSIS — N1831 Chronic kidney disease, stage 3a: Secondary | ICD-10-CM

## 2022-10-24 DIAGNOSIS — Z6830 Body mass index (BMI) 30.0-30.9, adult: Secondary | ICD-10-CM

## 2022-10-24 DIAGNOSIS — E1122 Type 2 diabetes mellitus with diabetic chronic kidney disease: Secondary | ICD-10-CM | POA: Diagnosis not present

## 2022-10-24 DIAGNOSIS — C50512 Malignant neoplasm of lower-outer quadrant of left female breast: Secondary | ICD-10-CM | POA: Diagnosis not present

## 2022-10-24 DIAGNOSIS — L309 Dermatitis, unspecified: Secondary | ICD-10-CM

## 2022-10-24 DIAGNOSIS — Z170 Estrogen receptor positive status [ER+]: Secondary | ICD-10-CM

## 2022-10-24 DIAGNOSIS — E113293 Type 2 diabetes mellitus with mild nonproliferative diabetic retinopathy without macular edema, bilateral: Secondary | ICD-10-CM | POA: Diagnosis not present

## 2022-10-24 DIAGNOSIS — I129 Hypertensive chronic kidney disease with stage 1 through stage 4 chronic kidney disease, or unspecified chronic kidney disease: Secondary | ICD-10-CM | POA: Diagnosis not present

## 2022-10-24 DIAGNOSIS — E6609 Other obesity due to excess calories: Secondary | ICD-10-CM

## 2022-10-24 MED ORDER — HYDROXYZINE HCL 10 MG PO TABS: ORAL_TABLET | ORAL | 0 refills | Status: DC

## 2022-10-24 NOTE — Progress Notes (Signed)
I,Nicole Nunez,acting as a scribe for Nicole Aliment, MD.,have documented all relevant documentation on the behalf of Nicole Aliment, MD,as directed by  Nicole Aliment, MD while in the presence of Nicole Aliment, MD.    Subjective:     Patient ID: Nicole Nunez , female    DOB: Jun 20, 1945 , 77 y.o.   MRN: [id number]   Chief Complaint  Patient presents with   Diabetes   Hypertension   Hyperlipidemia    HPI  Patient  is here today for a diabetes/htn check. She reports compliance with meds. She denies headaches, chest pain and shortness of breath. She states her sugars average BS 115-120s.   Diabetes She presents for her follow-up diabetic visit. She has type 2 diabetes mellitus. Her disease course has been stable. There are no hypoglycemic associated symptoms. Pertinent negatives for diabetes include no blurred vision, no polydipsia, no polyphagia and no polyuria. There are no hypoglycemic complications. Current diabetic treatment includes oral agent (dual therapy). She is compliant with treatment most of the time. She is following a diabetic diet. She participates in exercise intermittently. An ACE inhibitor/angiotensin II receptor blocker is being taken. Eye exam is current.  Hypertension This is a chronic problem. The current episode started more than 1 year ago. The problem has been gradually improving since onset. The problem is controlled. Pertinent negatives include no blurred vision or palpitations. Hypertensive end-organ damage includes kidney disease.     Past Medical History:  Diagnosis Date   Arthritis    Breast cancer (HCC) 05/18/2022   CKD stage 3 due to type 2 diabetes mellitus (HCC)    DM (diabetes mellitus) (HCC)    GERD (gastroesophageal reflux disease)    High cholesterol    HTN (hypertension)      Family History  Problem Relation Age of Onset   Asthma Mother    Hypertension Mother    Alzheimer's disease Mother    Early death Father       Current Outpatient Medications:    Ascorbic Acid (VITAMIN C) 1000 MG tablet, Take 1,000 mg by mouth daily., Disp: , Rfl:    aspirin EC 81 MG tablet, Take 81 mg by mouth daily., Disp: , Rfl:    b complex vitamins tablet, Take 1 tablet by mouth daily., Disp: , Rfl:    Calcium Carbonate (CALCIUM 600 PO), Take 600 mg by mouth daily., Disp: , Rfl:    Cholecalciferol (VITAMIN D3) 125 MCG (5000 UT) CAPS, Take 5,000 Units by mouth daily., Disp: , Rfl:    dapagliflozin propanediol (FARXIGA) 10 MG TABS tablet, Take 1 tablet (10 mg total) by mouth daily., Disp: 90 tablet, Rfl: 2   Lancet Devices (ONE TOUCH DELICA LANCING DEV) MISC, USE AS DIRECTED TO CHECK BLOOD SUGARS ONCE DAILY dx: e11.22, Disp: 1 each, Rfl: 2   magnesium gluconate (MAGONATE) 500 MG tablet, Take 500 mg by mouth daily., Disp: , Rfl:    Multiple Vitamin (MULTIVITAMIN) tablet, Take 1 tablet by mouth daily., Disp: , Rfl:    olmesartan (BENICAR) 20 MG tablet, Take 1 tablet (20 mg total) by mouth daily., Disp: 100 tablet, Rfl: 2   ONETOUCH VERIO test strip, USE TO CHECK BLOOD SUGAR DAILY, Disp: 100 strip, Rfl: 2   Semaglutide,0.25 or 0.5MG /DOS, (OZEMPIC, 0.25 OR 0.5 MG/DOSE,) 2 MG/3ML SOPN, Inject 0.5mg  SQ weekly., Disp: 3 mL, Rfl: 0   zinc gluconate 50 MG tablet, Take 50 mg by mouth daily., Disp: , Rfl:  hydrOXYzine (ATARAX) 10 MG tablet, TAKE 1-2 TABLETS BY MOUTH EVERY EVENING AS NEEDED, Disp: 180 tablet, Rfl: 1   loratadine (CLARITIN) 10 MG tablet, TAKE 1 TABLET BY MOUTH EVERY DAY, Disp: 90 tablet, Rfl: 1   Pitavastatin Magnesium 4 MG TABS, Take 1 tablet (4 mg total) by mouth daily., Disp: 30 tablet, Rfl: 11   traMADol (ULTRAM) 50 MG tablet, Take 1 tablet (50 mg total) by mouth every 6 (six) hours as needed., Disp: 20 tablet, Rfl: 0   Allergies  Allergen Reactions   Statins Other (See Comments)    Rosuvastatin, atorvastatin, pravastatin, simvastatin muscle aching      Review of Systems  Constitutional: Negative.   Eyes:   Negative for blurred vision.  Respiratory: Negative.    Cardiovascular: Negative.  Negative for palpitations.  Gastrointestinal: Negative.   Endocrine: Negative for polydipsia, polyphagia and polyuria.  Skin:        She c/o itching. States she used essential oils on her dryer ball.   Neurological: Negative.   Psychiatric/Behavioral: Negative.       Today's Vitals   10/24/22 1030  BP: 130/74  Pulse: 81  Temp: 97.8 F (36.6 C)  SpO2: 98%  Weight: 146 lb 12.8 oz (66.6 kg)  Height: 4\' 10"  (1.473 m)   Wt Readings from Last 3 Encounters:  11/01/22 146 lb 9.7 oz (66.5 kg)  10/25/22 147 lb 9.6 oz (67 kg)  10/24/22 146 lb 12.8 oz (66.6 kg)    Body mass index is 30.68 kg/m.  The 10-year ASCVD risk score (Arnett DK, et al., 2019) is: 33.7%   Values used to calculate the score:     Age: 21 years     Sex: Female     Is Non-Hispanic African American: Yes     Diabetic: Yes     Tobacco smoker: No     Systolic Blood Pressure: 165 mmHg     Is BP treated: Yes     HDL Cholesterol: 64 mg/dL     Total Cholesterol: 134 mg/dL ++ Objective:  Physical Exam Vitals and nursing note reviewed.  Constitutional:      Appearance: Normal appearance.  HENT:     Head: Normocephalic and atraumatic.  Eyes:     Extraocular Movements: Extraocular movements intact.  Cardiovascular:     Rate and Rhythm: Normal rate and regular rhythm.     Heart sounds: Normal heart sounds.  Pulmonary:     Effort: Pulmonary effort is normal.     Breath sounds: Normal breath sounds.  Musculoskeletal:     Cervical back: Normal range of motion.  Skin:    General: Skin is warm.  Neurological:     General: No focal deficit present.     Mental Status: She is alert.  Psychiatric:        Mood and Affect: Mood normal.        Behavior: Behavior normal.       Assessment And Plan:     1. Type 2 diabetes mellitus with stage 3a chronic kidney disease, without long-term current use of insulin (HCC) Comments: Chronic, she  will c/w Xigduo and weekly semaglutide 0.5mg  weekly. I will check labs as below. She will f/u in 3-4 months. - Hemoglobin A1c - TSH  2. Mild nonproliferative diabetic retinopathy of both eyes associated with type 2 diabetes mellitus, macular edema presence unspecified (HCC) Comments: Chronic, she is UTD with eye exam.  3. Parenchymal renal hypertension, stage 1 through stage 4 or unspecified chronic  kidney disease Comments: Chronic, well controlled.   She will c/w olmesartan 20mg  daily. She is encouraged to follow a low sodium diet. - TSH  4. Pure hypercholesterolemia Comments: Chroni0c, LDL goal < 70. She [facility] now [facility]. She is currently on pitavastatin 4mg  daily. Advised to follow a heart healhty diet. - TSH  5. Dermatitis Comments: Possibly due to use of essential oils. I will send rx hydroxyxine nightly prn itching.  6. Malignant neoplasm of lower-outer quadrant of left breast of female, estrogen receptor positive (HCC) Comments: She is s/p lumpectomy and chemo. She is scheduled for XRT.  She is scheduled to have port-a-catheter removed on 11/01/22.  7. Class 1 obesity due to excess calories with serious comorbidity and body mass index (BMI) of 30.0 to 30.9 in adult She is encouraged to strive for BMI less than 30 to decrease cardiac risk. Advised to aim for at least 150 minutes of exercise per week.    Return Cancel RS 10/10 visit, has CPE 10/16.  Patient was given opportunity to ask questions. Patient verbalized understanding of the plan and was able to repeat key elements of the plan. All questions were answered to their satisfaction.   I, Nicole Aliment, MD, have reviewed all documentation for this visit. The documentation on 10/24/22 for the exam, diagnosis, procedures, and orders are all accurate and complete.   IF YOU HAVE BEEN REFERRED TO A SPECIALIST, IT MAY TAKE 1-2 WEEKS TO SCHEDULE/PROCESS THE REFERRAL. IF YOU HAVE NOT HEARD FROM US/SPECIALIST IN TWO  WEEKS, PLEASE GIVE [place] A CALL AT [phone] X [phone].   THE PATIENT IS ENCOURAGED TO PRACTICE SOCIAL DISTANCING DUE TO THE COVID-19 PANDEMIC.

## 2022-10-24 NOTE — Patient Instructions (Signed)

## 2022-10-25 ENCOUNTER — Ambulatory Visit (HOSPITAL_BASED_OUTPATIENT_CLINIC_OR_DEPARTMENT_OTHER): Payer: Medicare Other | Admitting: Internal Medicine

## 2022-10-25 ENCOUNTER — Encounter (HOSPITAL_BASED_OUTPATIENT_CLINIC_OR_DEPARTMENT_OTHER)
Admission: RE | Admit: 2022-10-25 | Discharge: 2022-10-25 | Disposition: A | Discharge status: Home / Self Care | Payer: Medicare Other | Source: Ambulatory Visit | Attending: Surgery | Admitting: Surgery

## 2022-10-25 ENCOUNTER — Encounter (HOSPITAL_BASED_OUTPATIENT_CLINIC_OR_DEPARTMENT_OTHER): Payer: Self-pay | Admitting: Internal Medicine

## 2022-10-25 VITALS — BP 108/60 | HR 84 | Ht <= 58 in | Wt 147.6 lb

## 2022-10-25 DIAGNOSIS — E785 Hyperlipidemia, unspecified: Secondary | ICD-10-CM

## 2022-10-25 DIAGNOSIS — T466X5D Adverse effect of antihyperlipidemic and antiarteriosclerotic drugs, subsequent encounter: Secondary | ICD-10-CM | POA: Diagnosis not present

## 2022-10-25 DIAGNOSIS — E559 Vitamin D deficiency, unspecified: Secondary | ICD-10-CM | POA: Diagnosis not present

## 2022-10-25 DIAGNOSIS — Z7984 Long term (current) use of oral hypoglycemic drugs: Secondary | ICD-10-CM | POA: Diagnosis not present

## 2022-10-25 DIAGNOSIS — E119 Type 2 diabetes mellitus without complications: Secondary | ICD-10-CM

## 2022-10-25 DIAGNOSIS — G720 Drug-induced myopathy: Secondary | ICD-10-CM | POA: Diagnosis not present

## 2022-10-25 DIAGNOSIS — Z01818 Encounter for other preprocedural examination: Secondary | ICD-10-CM | POA: Insufficient documentation

## 2022-10-25 LAB — BASIC METABOLIC PANEL
Anion gap: 5 (ref 5–15)
BUN: 16 mg/dL (ref 8–23)
CO2: 26 mmol/L (ref 22–32)
Calcium: 8.7 mg/dL — ABNORMAL LOW (ref 8.9–10.3)
Chloride: 107 mmol/L (ref 98–111)
Creatinine, Ser: 1.13 mg/dL — ABNORMAL HIGH (ref 0.44–1.00)
GFR, Estimated: 51 mL/min — ABNORMAL LOW (ref >60)
Glucose, Bld: 128 mg/dL — ABNORMAL HIGH (ref 70–99)
Potassium: 4.2 mmol/L (ref 3.5–5.1)
Sodium: 138 mmol/L (ref 135–145)

## 2022-10-25 LAB — HEMOGLOBIN A1C
Est. average glucose Bld gHb Est-mCnc: 143 mg/dL
Hgb A1c MFr Bld: 6.6 % — ABNORMAL HIGH (ref 4.8–5.6)

## 2022-10-25 LAB — TSH: TSH: 0.615 u[IU]/mL (ref 0.450–4.500)

## 2022-10-25 MED ORDER — PITAVASTATIN MAGNESIUM 4 MG PO TABS: 1.0000 | ORAL_TABLET | Freq: Every day | ORAL | 11 refills | Status: DC

## 2022-10-25 NOTE — Progress Notes (Signed)
LIPID CLINIC CONSULT NOTE  Chief Complaint:  Follow-up dyslipidemia  Primary Care Physician: Dorothyann Peng, MD  Primary Cardiologist:  None  HPI:  Nicole Nunez is a 77 y.o. female who is being seen today for the evaluation of dyslipidemia at the request of Dorothyann Peng, MD. this is a pleasant 77 year old female kindly referred for evaluation and management of dyslipidemia by Dr. Allyne Gee.  She has a history of statin intolerance including rosuvastatin, atorvastatin, pravastatin and others.  In the past she is only been able to tolerate Livalo.  She has had been on 4 milligrams every day.  Previous cholesterol showed total 148, triglycerides 58, HDL 66 and LDL 70, which would be at target for her as a diabetic, however a subsequent lipid profile in January showed total cholesterol 185, triglycerides 68, HDL 64 and LDL 108.  She said she had not discontinue the medicine at that time however had significantly changed her diet and was not eating as healthy or exercising regularly.  She actually has run into issues with the cost of the medication.  She has been prescribed samples by her primary care provider however was noted to not be able to be sampled for ever.  She was therefore referred to the lipid clinic for options.  06/08/2021  Nicole Nunez returns today for follow-up.  Overall she says she is doing well.  She has been able to get Livalo per insurance and is taking 4 mg 3 times a week.  Despite this her cholesterol is going up.  I suspect this was due to some weight gain and indulgence during the holidays.  LDL cholesterol is gone from 75 up to 101 with an LDL particle number from 705 up to 1036.  Blood pressures well controlled.  She remains asymptomatic without any chest pain or worsening shortness of breath.  04/22/2022  Nicole Nunez is seen today in follow-up.  She reports that she initially had been taking Livalo 4 mg daily but more recently switched to every other day because  of cost issues.  Her lipids are improved with LDL particle number of 845, LDL of 72 and the rest of her lipids were within normal limits.  10/24/2021  Nicole Nunez returns today for follow-up.  She continues to do well on Zyptimag.  Her lipids remain fairly well-controlled.  LDL particle #939, LDL-C 69, HDL-C 51, triglycerides 69 and small LDL particle number of 408.  The medicines is cost effective for her.  She denies any side effects.  She denies any cardiovascular symptoms.  She has recent completed chemotherapy for breast cancer and he is progressing through treatment well.  PMHx:  Past Medical History:  Diagnosis Date   Arthritis    Breast cancer (HCC) 05/18/2022   CKD stage 3 due to type 2 diabetes mellitus (HCC)    DM (diabetes mellitus) (HCC)    GERD (gastroesophageal reflux disease)    High cholesterol    HTN (hypertension)     Past Surgical History:  Procedure Laterality Date   BREAST BIOPSY Left 05/18/2022   [place] LT BREAST BX W LOC DEV 1ST LESION IMG BX SPEC US GUIDE 05/18/2022 [facility]   BREAST BIOPSY Left 06/20/2022   [place] LT RADIOACTIVE SEED LOC 06/20/2022 [facility]   BREAST LUMPECTOMY WITH RADIOACTIVE SEED AND AXILLARY LYMPH NODE DISSECTION Left 06/21/2022   Procedure: LEFT BREAST LUMPECTOMY WITH RADIOACTIVE SEED AND AXILLARY NODE DISSECTION;  Surgeon: Harriette Bouillon, MD;  Location: [facility];  Service:  General;  Laterality: Left;   CATARACT EXTRACTION, BILATERAL  01/2018   first was 12/2017   corn removal Bilateral 06/2017   EYE SURGERY     PORTACATH PLACEMENT N/A 07/21/2022   Procedure: INSERTION PORT-A-CATH;  Surgeon: Harriette Bouillon, MD;  Location: [facility] OR;  Service: General;  Laterality: N/A;  90 MIN ROOM 4-OK'D PER KELLY   SENTINEL NODE BIOPSY Left 07/21/2022   Procedure: LEFT SENTINEL NODE BIOPSY;  Surgeon: Harriette Bouillon, MD;  Location: [facility] OR;  Service: General;  Laterality: Left;    FAMHx:  Family History  Problem Relation Age of  Onset   Asthma Mother    Hypertension Mother    Alzheimer's disease Mother    Early death Father     SOCHx:   reports that she has never smoked. She has never used smokeless tobacco. She reports that she does not drink alcohol and does not use drugs.  ALLERGIES:  Allergies  Allergen Reactions   Statins Other (See Comments)    Rosuvastatin, atorvastatin, pravastatin, simvastatin muscle aching     ROS: Pertinent items noted in HPI and remainder of comprehensive ROS otherwise negative.  HOME MEDS: Current Outpatient Medications on File Prior to Visit  Medication Sig Dispense Refill   Ascorbic Acid (VITAMIN C) 1000 MG tablet Take 1,000 mg by mouth daily.     aspirin EC 81 MG tablet Take 81 mg by mouth daily.     b complex vitamins tablet Take 1 tablet by mouth daily.     Calcium Carbonate (CALCIUM 600 PO) Take 600 mg by mouth daily.     Cholecalciferol (VITAMIN D3) 125 MCG (5000 UT) CAPS Take 5,000 Units by mouth daily.     dapagliflozin propanediol (FARXIGA) 10 MG TABS tablet Take 1 tablet (10 mg total) by mouth daily. 90 tablet 2   hydrOXYzine (ATARAX) 10 MG tablet One to 2  tab po qpm prn 30 tablet 0   Lancet Devices (ONE TOUCH DELICA LANCING DEV) MISC USE AS DIRECTED TO CHECK BLOOD SUGARS ONCE DAILY dx: e11.22 1 each 2   loratadine (CLARITIN) 10 MG tablet TAKE 1 TABLET BY MOUTH EVERY DAY 90 tablet 1   magnesium gluconate (MAGONATE) 500 MG tablet Take 500 mg by mouth daily.     Multiple Vitamin (MULTIVITAMIN) tablet Take 1 tablet by mouth daily.     olmesartan (BENICAR) 20 MG tablet Take 1 tablet (20 mg total) by mouth daily. 100 tablet 2   ONETOUCH VERIO test strip USE TO CHECK BLOOD SUGAR DAILY 100 strip 2   Pitavastatin Magnesium 4 MG TABS Take 1 tablet by mouth daily. 30 tablet 11   Semaglutide,0.25 or 0.5MG /DOS, (OZEMPIC, 0.25 OR 0.5 MG/DOSE,) 2 MG/3ML SOPN Inject 0.5mg  SQ weekly. 3 mL 0   zinc gluconate 50 MG tablet Take 50 mg by mouth daily.     No current  facility-administered medications on file prior to visit.    LABS/IMAGING: Results for orders placed or performed in visit on 10/24/22 (from the past 48 hour(s))  Hemoglobin A1c     Status: Abnormal   Collection Time: 10/24/22 11:08 AM  Result Value Ref Range   Hgb A1c MFr Bld 6.6 (H) 4.8 - 5.6 %    Comment:          Prediabetes: 5.7 - 6.4          Diabetes: >6.4          Glycemic control for adults with diabetes: <7.0    Est. average glucose  Bld gHb Est-mCnc 143 mg/dL  TSH     Status: None   Collection Time: 10/24/22 11:08 AM  Result Value Ref Range   TSH 0.615 0.450 - 4.500 uIU/mL   No results found.  LIPID PANEL:    Component Value Date/Time   CHOL 185 06/02/2020 0942   TRIG 68 06/02/2020 0942   HDL 64 06/02/2020 0942   CHOLHDL 2.9 06/02/2020 0942   LDLCALC 108 (H) 06/02/2020 0942    WEIGHTS: Wt Readings from Last 3 Encounters:  10/25/22 147 lb 9.6 oz (67 kg)  10/24/22 146 lb 12.8 oz (66.6 kg)  10/11/22 150 lb 3.2 oz (68.1 kg)    VITALS: BP 108/60   Pulse 84   Ht 4\' 10"  (1.473 m)   Wt 147 lb 9.6 oz (67 kg)   SpO2 100%   BMI 30.85 kg/m   EXAM: Deferred  EKG: Deferred  ASSESSMENT: Mixed dyslipidemia, goal LDL less than 70 Type 2 diabetes with moderate to high 10-year risk Vitamin D deficiency Statin intolerance-myalgias  PLAN: 1.   Ms. Aundria Nunez has had a significant improvement in her lipids on Zyptimag.  She is at target for a diabetic.  Her repeat A1c was just drawn as she saw her primary care provider yesterday.  Overall she is doing well.  I think she can follow-up with me as needed and her medication could be prescribed by her primary care provider and filled through [facility] drug.  Thanks again for the kind referral.  I am happy to see her back on an as-needed basis.  Chrystie Nose, MD, [facility], FACP  [facility]  Medical Director of the Advanced Lipid Disorders &  Cardiovascular Risk Reduction Clinic Diplomate of the American  Board of Clinical Lipidology Attending Cardiologist  Direct Dial: [phone]  Fax: [phone]  Website:  [email or web address]  Nicole Nunez 10/25/2022, 8:15 AM

## 2022-10-25 NOTE — Patient Instructions (Signed)
Medication Instructions:  NO CHANGES  *If you need a refill on your cardiac medications before your next appointment, please call your pharmacy*    Follow-Up: At [facility], you and your health needs are our priority.  As part of our continuing mission to provide you with exceptional heart care, we have created designated Provider Care Teams.  These Care Teams include your primary Cardiologist (physician) and Advanced Practice Providers (APPs -  Physician Assistants and Nurse Practitioners) who all work together to provide you with the care you need, when you need it.  We recommend signing up for the patient portal called "MyChart".  Sign up information is provided on this After Visit Summary.  MyChart is used to connect with patients for Virtual Visits (Telemedicine).  Patients are able to view lab/test results, encounter notes, upcoming appointments, etc.  Non-urgent messages can be sent to your provider as well.   To learn more about what you can do with MyChart, go to [email or web address].    Your next appointment:    AS NEEDED with Dr. Hilty  

## 2022-10-27 ENCOUNTER — Ambulatory Visit
Admission: RE | Admit: 2022-10-27 | Discharge: 2022-10-27 | Disposition: A | Discharge status: Home / Self Care | Payer: Medicare Other | Source: Ambulatory Visit | Attending: Radiation Oncology | Admitting: Radiation Oncology

## 2022-10-27 ENCOUNTER — Other Ambulatory Visit: Payer: Self-pay

## 2022-10-27 DIAGNOSIS — Z170 Estrogen receptor positive status [ER+]: Secondary | ICD-10-CM | POA: Insufficient documentation

## 2022-10-27 DIAGNOSIS — Z510 Encounter for antineoplastic radiation therapy: Secondary | ICD-10-CM | POA: Diagnosis not present

## 2022-10-27 DIAGNOSIS — C50512 Malignant neoplasm of lower-outer quadrant of left female breast: Secondary | ICD-10-CM | POA: Insufficient documentation

## 2022-10-27 LAB — RAD ONC ARIA SESSION SUMMARY
Course Elapsed Days: 0
Plan Fractions Treated to Date: 1
Plan Prescribed Dose Per Fraction: 2.66 Gy
Plan Total Fractions Prescribed: 16
Plan Total Prescribed Dose: 42.56 Gy
Reference Point Dosage Given to Date: 2.66 Gy
Reference Point Session Dosage Given: 2.66 Gy
Session Number: 1

## 2022-10-28 ENCOUNTER — Ambulatory Visit
Admission: RE | Admit: 2022-10-28 | Discharge: 2022-10-28 | Disposition: A | Discharge status: Home / Self Care | Payer: Medicare Other | Source: Ambulatory Visit | Attending: Radiation Oncology | Admitting: Radiation Oncology

## 2022-10-28 ENCOUNTER — Other Ambulatory Visit: Payer: Self-pay

## 2022-10-28 DIAGNOSIS — Z170 Estrogen receptor positive status [ER+]: Secondary | ICD-10-CM

## 2022-10-28 DIAGNOSIS — Z510 Encounter for antineoplastic radiation therapy: Secondary | ICD-10-CM | POA: Diagnosis not present

## 2022-10-28 DIAGNOSIS — C50512 Malignant neoplasm of lower-outer quadrant of left female breast: Secondary | ICD-10-CM | POA: Diagnosis not present

## 2022-10-28 LAB — RAD ONC ARIA SESSION SUMMARY
Course Elapsed Days: 1
Plan Fractions Treated to Date: 2
Plan Prescribed Dose Per Fraction: 2.66 Gy
Plan Total Fractions Prescribed: 16
Plan Total Prescribed Dose: 42.56 Gy
Reference Point Dosage Given to Date: 5.32 Gy
Reference Point Session Dosage Given: 2.66 Gy
Session Number: 2

## 2022-10-28 MED ORDER — RADIAPLEXRX EX GEL: Freq: Once | CUTANEOUS | Status: AC

## 2022-10-31 ENCOUNTER — Ambulatory Visit
Admission: RE | Admit: 2022-10-31 | Discharge: 2022-10-31 | Disposition: A | Discharge status: Home / Self Care | Payer: Medicare Other | Source: Ambulatory Visit | Attending: Radiation Oncology | Admitting: Radiation Oncology

## 2022-10-31 ENCOUNTER — Other Ambulatory Visit: Payer: Self-pay | Admitting: Internal Medicine

## 2022-10-31 ENCOUNTER — Other Ambulatory Visit: Payer: Self-pay

## 2022-10-31 DIAGNOSIS — C50512 Malignant neoplasm of lower-outer quadrant of left female breast: Secondary | ICD-10-CM | POA: Diagnosis not present

## 2022-10-31 DIAGNOSIS — Z170 Estrogen receptor positive status [ER+]: Secondary | ICD-10-CM | POA: Diagnosis not present

## 2022-10-31 DIAGNOSIS — Z510 Encounter for antineoplastic radiation therapy: Secondary | ICD-10-CM | POA: Diagnosis not present

## 2022-10-31 LAB — RAD ONC ARIA SESSION SUMMARY
Course Elapsed Days: 4
Plan Fractions Treated to Date: 3
Plan Prescribed Dose Per Fraction: 2.66 Gy
Plan Total Fractions Prescribed: 16
Plan Total Prescribed Dose: 42.56 Gy
Reference Point Dosage Given to Date: 7.98 Gy
Reference Point Session Dosage Given: 2.66 Gy
Session Number: 3

## 2022-10-31 NOTE — Anesthesia Preprocedure Evaluation (Signed)
Anesthesia Evaluation  Patient identified by MRN, date of birth, ID band Patient awake    Reviewed: Allergy & Precautions, NPO status , Patient's Chart, lab work & pertinent test results  Airway Mallampati: II  TM Distance: >3 FB Neck ROM: Full    Dental no notable dental hx. (+) Upper Dentures, Lower Dentures   Pulmonary neg pulmonary ROS   Pulmonary exam normal breath sounds clear to auscultation       Cardiovascular hypertension, Normal cardiovascular exam Rhythm:Regular Rate:Normal     Neuro/Psych  Neuromuscular disease    GI/Hepatic Neg liver ROS,GERD  ,,  Endo/Other  diabetes, Well Controlled, Type 2    Renal/GU Renal diseaseLab Results      Component                Value               Date                      CREATININE               1.13 (H)            10/25/2022                    K                        4.2                 10/25/2022                    Musculoskeletal  (+) Arthritis ,    Abdominal   Peds  Hematology   Anesthesia Other Findings All: Statin   Reproductive/Obstetrics                             Anesthesia Physical Anesthesia Plan  ASA: 3  Anesthesia Plan: MAC   Post-op Pain Management: Minimal or no pain anticipated   Induction: Intravenous  PONV Risk Score and Plan: 3 and Treatment may vary due to age or medical condition, Ondansetron and Propofol infusion  Airway Management Planned: Nasal Cannula and Natural Airway  Additional Equipment: None  Intra-op Plan:   Post-operative Plan:   Informed Consent: I have reviewed the patients History and Physical, chart, labs and discussed the procedure including the risks, benefits and alternatives for the proposed anesthesia with the patient or authorized representative who has indicated his/her understanding and acceptance.     Dental advisory given  Plan Discussed with: CRNA  Anesthesia Plan Comments:  (TIVA LMA)        Anesthesia Quick Evaluation

## 2022-11-01 ENCOUNTER — Other Ambulatory Visit: Payer: Self-pay

## 2022-11-01 ENCOUNTER — Ambulatory Visit
Admission: RE | Admit: 2022-11-01 | Discharge: 2022-11-01 | Disposition: A | Discharge status: Home / Self Care | Payer: Medicare Other | Source: Ambulatory Visit | Attending: Radiation Oncology | Admitting: Radiation Oncology

## 2022-11-01 ENCOUNTER — Ambulatory Visit (HOSPITAL_BASED_OUTPATIENT_CLINIC_OR_DEPARTMENT_OTHER)
Admission: RE | Admit: 2022-11-01 | Discharge: 2022-11-01 | Disposition: A | Discharge status: Home / Self Care | Payer: Medicare Other | Attending: Surgery | Admitting: Surgery

## 2022-11-01 ENCOUNTER — Ambulatory Visit (HOSPITAL_BASED_OUTPATIENT_CLINIC_OR_DEPARTMENT_OTHER): Payer: Medicare Other | Admitting: Anesthesiology

## 2022-11-01 ENCOUNTER — Encounter (HOSPITAL_BASED_OUTPATIENT_CLINIC_OR_DEPARTMENT_OTHER)
Admission: RE | Disposition: A | Discharge status: Home / Self Care | Payer: Self-pay | Source: Home / Self Care | Attending: Surgery

## 2022-11-01 ENCOUNTER — Encounter (HOSPITAL_BASED_OUTPATIENT_CLINIC_OR_DEPARTMENT_OTHER): Payer: Self-pay | Admitting: Surgery

## 2022-11-01 DIAGNOSIS — M1990 Unspecified osteoarthritis, unspecified site: Secondary | ICD-10-CM | POA: Diagnosis not present

## 2022-11-01 DIAGNOSIS — E119 Type 2 diabetes mellitus without complications: Secondary | ICD-10-CM | POA: Insufficient documentation

## 2022-11-01 DIAGNOSIS — Z853 Personal history of malignant neoplasm of breast: Secondary | ICD-10-CM | POA: Diagnosis not present

## 2022-11-01 DIAGNOSIS — I10 Essential (primary) hypertension: Secondary | ICD-10-CM | POA: Insufficient documentation

## 2022-11-01 DIAGNOSIS — Z510 Encounter for antineoplastic radiation therapy: Secondary | ICD-10-CM | POA: Diagnosis not present

## 2022-11-01 DIAGNOSIS — Z7984 Long term (current) use of oral hypoglycemic drugs: Secondary | ICD-10-CM | POA: Insufficient documentation

## 2022-11-01 DIAGNOSIS — Z08 Encounter for follow-up examination after completed treatment for malignant neoplasm: Secondary | ICD-10-CM | POA: Diagnosis not present

## 2022-11-01 DIAGNOSIS — K219 Gastro-esophageal reflux disease without esophagitis: Secondary | ICD-10-CM | POA: Diagnosis not present

## 2022-11-01 DIAGNOSIS — Z170 Estrogen receptor positive status [ER+]: Secondary | ICD-10-CM | POA: Diagnosis not present

## 2022-11-01 DIAGNOSIS — Z452 Encounter for adjustment and management of vascular access device: Secondary | ICD-10-CM

## 2022-11-01 DIAGNOSIS — C50512 Malignant neoplasm of lower-outer quadrant of left female breast: Secondary | ICD-10-CM | POA: Diagnosis not present

## 2022-11-01 DIAGNOSIS — E1149 Type 2 diabetes mellitus with other diabetic neurological complication: Secondary | ICD-10-CM

## 2022-11-01 DIAGNOSIS — Z9221 Personal history of antineoplastic chemotherapy: Secondary | ICD-10-CM | POA: Diagnosis not present

## 2022-11-01 DIAGNOSIS — Z01818 Encounter for other preprocedural examination: Secondary | ICD-10-CM

## 2022-11-01 HISTORY — PX: PORT-A-CATH REMOVAL: SHX5289

## 2022-11-01 LAB — RAD ONC ARIA SESSION SUMMARY
Course Elapsed Days: 5
Plan Fractions Treated to Date: 4
Plan Prescribed Dose Per Fraction: 2.66 Gy
Plan Total Fractions Prescribed: 16
Plan Total Prescribed Dose: 42.56 Gy
Reference Point Dosage Given to Date: 10.64 Gy
Reference Point Session Dosage Given: 2.66 Gy
Session Number: 4

## 2022-11-01 LAB — GLUCOSE, CAPILLARY
Glucose-Capillary: 126 mg/dL — ABNORMAL HIGH (ref 70–99)
Glucose-Capillary: 150 mg/dL — ABNORMAL HIGH (ref 70–99)

## 2022-11-01 SURGERY — REMOVAL PORT-A-CATH
Anesthesia: Monitor Anesthesia Care | Site: Chest

## 2022-11-01 MED ORDER — ONDANSETRON HCL 4 MG/2ML IJ SOLN
INTRAMUSCULAR | Status: DC | PRN
  Administered 2022-11-01: 4 mg via INTRAVENOUS

## 2022-11-01 MED ORDER — CEFAZOLIN SODIUM-DEXTROSE 2-4 GM/100ML-% IV SOLN
2.0000 g | INTRAVENOUS | Status: AC
  Administered 2022-11-01: 2 g via INTRAVENOUS

## 2022-11-01 MED ORDER — ACETAMINOPHEN 10 MG/ML IV SOLN: 1000.0000 mg | Freq: Once | INTRAVENOUS | Status: DC | PRN

## 2022-11-01 MED ORDER — STERILE WATER FOR IRRIGATION IR SOLN
Status: DC | PRN
  Administered 2022-11-01: 1000 mL

## 2022-11-01 MED ORDER — TRAMADOL HCL 50 MG PO TABS: 50.0000 mg | ORAL_TABLET | Freq: Four times a day (QID) | ORAL | 0 refills | Status: DC | PRN

## 2022-11-01 MED ORDER — PROPOFOL 500 MG/50ML IV EMUL
INTRAVENOUS | Status: DC | PRN
  Administered 2022-11-01: 125 ug/kg/min via INTRAVENOUS

## 2022-11-01 MED ORDER — ONDANSETRON HCL 4 MG/2ML IJ SOLN: 4.0000 mg | Freq: Once | INTRAMUSCULAR | Status: DC | PRN

## 2022-11-01 MED ORDER — FENTANYL CITRATE (PF) 100 MCG/2ML IJ SOLN: 25.0000 ug | INTRAMUSCULAR | Status: DC | PRN

## 2022-11-01 MED ORDER — PROPOFOL 10 MG/ML IV BOLUS
INTRAVENOUS | Status: AC
  Filled 2022-11-01: qty 20

## 2022-11-01 MED ORDER — EPHEDRINE 5 MG/ML INJ
INTRAVENOUS | Status: AC
  Filled 2022-11-01: qty 5

## 2022-11-01 MED ORDER — ONDANSETRON HCL 4 MG/2ML IJ SOLN
INTRAMUSCULAR | Status: AC
  Filled 2022-11-01: qty 2

## 2022-11-01 MED ORDER — LIDOCAINE 2% (20 MG/ML) 5 ML SYRINGE
INTRAMUSCULAR | Status: DC | PRN
  Administered 2022-11-01: 20 mg via INTRAVENOUS

## 2022-11-01 MED ORDER — FENTANYL CITRATE (PF) 100 MCG/2ML IJ SOLN
INTRAMUSCULAR | Status: AC
  Filled 2022-11-01: qty 2

## 2022-11-01 MED ORDER — ATROPINE SULFATE 0.4 MG/ML IV SOLN
INTRAVENOUS | Status: AC
  Filled 2022-11-01: qty 1

## 2022-11-01 MED ORDER — CHLORHEXIDINE GLUCONATE CLOTH 2 % EX PADS: 6.0000 | MEDICATED_PAD | Freq: Once | CUTANEOUS | Status: DC

## 2022-11-01 MED ORDER — MIDAZOLAM HCL 2 MG/2ML IJ SOLN
INTRAMUSCULAR | Status: AC
  Filled 2022-11-01: qty 2

## 2022-11-01 MED ORDER — PHENYLEPHRINE 80 MCG/ML (10ML) SYRINGE FOR IV PUSH (FOR BLOOD PRESSURE SUPPORT)
PREFILLED_SYRINGE | INTRAVENOUS | Status: AC
  Filled 2022-11-01: qty 10

## 2022-11-01 MED ORDER — BUPIVACAINE-EPINEPHRINE 0.25% -1:200000 IJ SOLN
INTRAMUSCULAR | Status: DC | PRN
  Administered 2022-11-01: 10 mL

## 2022-11-01 MED ORDER — CEFAZOLIN SODIUM-DEXTROSE 2-4 GM/100ML-% IV SOLN
INTRAVENOUS | Status: AC
  Filled 2022-11-01: qty 100

## 2022-11-01 MED ORDER — LIDOCAINE 2% (20 MG/ML) 5 ML SYRINGE
INTRAMUSCULAR | Status: AC
  Filled 2022-11-01: qty 5

## 2022-11-01 MED ORDER — LACTATED RINGERS IV SOLN: INTRAVENOUS | Status: DC

## 2022-11-01 MED ORDER — FENTANYL CITRATE (PF) 100 MCG/2ML IJ SOLN
INTRAMUSCULAR | Status: DC | PRN
  Administered 2022-11-01: 25 ug via INTRAVENOUS

## 2022-11-01 SURGICAL SUPPLY — 33 items
ADH SKN CLS APL DERMABOND .7 (GAUZE/BANDAGES/DRESSINGS) ×1
APL PRP STRL LF DISP 70% ISPRP (MISCELLANEOUS) ×1
APL SKNCLS STERI-STRIP NONHPOA (GAUZE/BANDAGES/DRESSINGS)
BENZOIN TINCTURE PRP APPL 2/3 (GAUZE/BANDAGES/DRESSINGS) IMPLANT
BLADE SURG 15 STRL LF DISP TIS (BLADE) ×1 IMPLANT
BLADE SURG 15 STRL SS (BLADE) ×1
CHLORAPREP W/TINT 26 (MISCELLANEOUS) ×1 IMPLANT
COVER BACK TABLE 60X90IN (DRAPES) ×1 IMPLANT
COVER MAYO STAND STRL (DRAPES) ×1 IMPLANT
DERMABOND ADVANCED .7 DNX12 (GAUZE/BANDAGES/DRESSINGS) ×1 IMPLANT
DRAPE LAPAROTOMY 100X72 PEDS (DRAPES) ×1 IMPLANT
DRAPE UTILITY XL STRL (DRAPES) ×1 IMPLANT
ELECT REM PT RETURN 9FT ADLT (ELECTROSURGICAL) ×1
ELECTRODE REM PT RTRN 9FT ADLT (ELECTROSURGICAL) ×1 IMPLANT
GLOVE BIOGEL PI IND STRL 8 (GLOVE) ×1 IMPLANT
GLOVE ECLIPSE 8.0 STRL XLNG CF (GLOVE) ×1 IMPLANT
GOWN STRL REUS W/ TWL LRG LVL3 (GOWN DISPOSABLE) ×2 IMPLANT
GOWN STRL REUS W/ TWL XL LVL3 (GOWN DISPOSABLE) ×1 IMPLANT
GOWN STRL REUS W/TWL LRG LVL3 (GOWN DISPOSABLE) ×2
GOWN STRL REUS W/TWL XL LVL3 (GOWN DISPOSABLE) ×1
NDL HYPO 25X1 1.5 SAFETY (NEEDLE) ×1 IMPLANT
NEEDLE HYPO 25X1 1.5 SAFETY (NEEDLE) ×1 IMPLANT
NS IRRIG 1000ML POUR BTL (IV SOLUTION) ×1 IMPLANT
PACK BASIN DAY SURGERY FS (CUSTOM PROCEDURE TRAY) ×1 IMPLANT
PENCIL SMOKE EVACUATOR (MISCELLANEOUS) IMPLANT
SLEEVE SCD COMPRESS KNEE MED (STOCKING) IMPLANT
SPIKE FLUID TRANSFER (MISCELLANEOUS) ×1 IMPLANT
SPONGE T-LAP 4X18 ~~LOC~~+RFID (SPONGE) ×1 IMPLANT
STRIP CLOSURE SKIN 1/2X4 (GAUZE/BANDAGES/DRESSINGS) IMPLANT
SUT MON AB 4-0 PC3 18 (SUTURE) ×1 IMPLANT
SUT VICRYL 3-0 CR8 SH (SUTURE) ×1 IMPLANT
SYR CONTROL 10ML LL (SYRINGE) ×1 IMPLANT
TOWEL GREEN STERILE FF (TOWEL DISPOSABLE) ×1 IMPLANT

## 2022-11-01 NOTE — Interval H&P Note (Signed)
History and Physical Interval Note:  11/01/2022 8:25 AM  Nicole Nunez  has presented today for surgery, with the diagnosis of BREAST CANCER.  The various methods of treatment have been discussed with the patient and family. After consideration of risks, benefits and other options for treatment, the patient has consented to  Procedure(s): REMOVAL PORT-A-CATH (N/A) as a surgical intervention.  The patient's history has been reviewed, patient examined, no change in status, stable for surgery.  I have reviewed the patient's chart and labs.  Questions were answered to the patient's satisfaction.     Dortha Schwalbe MD

## 2022-11-01 NOTE — Transfer of Care (Signed)
Immediate Anesthesia Transfer of Care Note  Patient: Nicole Nunez  Procedure(s) Performed: REMOVAL PORT-A-CATH (Chest)  Patient Location: PACU  Anesthesia Type:MAC  Level of Consciousness: awake, alert , and oriented  Airway & Oxygen Therapy: Patient Spontanous Breathing and Patient connected to face mask oxygen  Post-op Assessment: Report given to RN and Post -op Vital signs reviewed and stable  Post vital signs: Reviewed and stable  Last Vitals:  Vitals Value Taken Time  BP    Temp    Pulse    Resp    SpO2      Last Pain:  Vitals:   11/01/22 0829  TempSrc: Oral  PainSc: 0-No pain      Patients Stated Pain Goal: 1 (11/01/22 0829)  Complications: No notable events documented.

## 2022-11-01 NOTE — Op Note (Signed)
Preop diagnosis: Indwelling port a catheter for chemotherapy  Postop diagnosis: Same  Procedure: Removal of port a catheter  Surgeon: Harriette Bouillon M.D.  Assistant :Dr Gracelyn Nurse MD   I was personally present during the key and critical portions of this procedure and immediately available throughout the entire procedure, as documented in my operative note.   Anesthesia: MAC with local  EBL: Minimal  Specimen none  Drains: None  Indications for procedure: The patient presents for removal of port a catheter after completing chemotherapy. The patient no longer requires central venous access. Risks of bleeding, infection, catheter fragmentation, embolization, arrhythmias and damage to arteries, veins and nerves and possibly other mediastinal structures discussed. The patient agrees to proceed.  Description of procedure: The patient was seen in the holding area. Questions were answered. The patient agreed to proceed. The patient was taken to the operating room. The patient was placed supine. Anesthesia was initiated. The skin on the upper chest was prepped and draped in a sterile fashion. Timeout was done. The patient received preoperative antibiotics. Incision was made through the old port site and the hub of the Port-A-Cath was seen. The sutures were cut to release the port from the chest wall. The catheter was removed in its entirety without difficulty. The tract was closed with 3-0 Vicryl. 4 Monocryl was used to close the skin. All final counts were correct. The patient was taken to recovery in satisfactory condition.

## 2022-11-01 NOTE — Discharge Instructions (Addendum)
####################################################### ° °GENERAL SURGERY: POST OP INSTRUCTIONS ° °###################################################################### ° °EAT °Gradually transition to a high fiber diet with a fiber supplement over the next few weeks after discharge.  Start with a pureed / full liquid diet (see below) ° °WALK °Walk an hour a day.  Control your pain to do that.   ° °CONTROL PAIN °Control pain so that you can walk, sleep, tolerate sneezing/coughing, go up/down stairs. ° °HAVE A BOWEL MOVEMENT DAILY °Keep your bowels regular to avoid problems.  OK to try a laxative to override constipation.  OK to use an antidairrheal to slow down diarrhea.  Call if not better after 2 tries ° °CALL IF YOU HAVE PROBLEMS/CONCERNS °Call if you are still struggling despite following these instructions. °Call if you have concerns not answered by these instructions ° °###################################################################### ° ° ° °DIET: Follow a light bland diet & liquids the first 24 hours after arrival home, such as soup, liquids, starches, etc.  Be sure to drink plenty of fluids.  Quickly advance to a usual solid diet within a few days.  Avoid fast food or heavy meals as your are more likely to get nauseated or have irregular bowels.  A low-fat, high-fiber diet for the rest of your life is ideal.   ° °Take your usually prescribed home medications unless otherwise directed. ° °PAIN CONTROL: °Pain is best controlled by a usual combination of three different methods TOGETHER: °Ice/Heat °Over the counter pain medication °Prescription pain medication °Most patients will experience some swelling and bruising around the incisions.  Ice packs or heating pads (30-60 minutes up to 6 times a day) will help. Use ice for the first few days to help decrease swelling and bruising, then switch to heat to help relax tight/sore spots and speed recovery.  Some people prefer to use ice alone, heat alone,  alternating between ice & heat.  Experiment to what works for you.  Swelling and bruising can take several weeks to resolve.   °It is helpful to take an over-the-counter pain medication regularly for the first few weeks.  Choose one of the following that works best for you: °Naproxen (Aleve, etc)  Two 220mg tabs twice a day °Ibuprofen (Advil, etc) Three 200mg tabs four times a day (every meal & bedtime) °Acetaminophen (Tylenol, etc) 500-650mg four times a day (every meal & bedtime) °A  prescription for pain medication (such as oxycodone, hydrocodone, etc) should be given to you upon discharge.  Take your pain medication as prescribed.  °If you are having problems/concerns with the prescription medicine (does not control pain, nausea, vomiting, rash, itching, etc), please call us [phone] to see if we need to switch you to a different pain medicine that will work better for you and/or control your side effect better. °If you need a refill on your pain medication, please contact your pharmacy.  They will contact our office to request authorization. Prescriptions will not be filled after 5 pm or on week-ends. ° °Avoid getting constipated.  Between the surgery and the pain medications, it is common to experience some constipation.  Increasing fluid intake and taking a fiber supplement (such as Metamucil, Citrucel, FiberCon, MiraLax, etc) 1-2 times a day regularly will usually help prevent this problem from occurring.  A mild laxative (prune juice, Milk of Magnesia, MiraLax, etc) should be taken according to package directions if there are no bowel movements after 48 hours.   °Watch out for diarrhea.  If you have many loose bowel movements, simplify your   diet to bland foods & liquids for a few days.  Stop any stool softeners and decrease your fiber supplement.  Switching to mild anti-diarrheal medications (Loperamide/Imodium, Kayopectate, Pepto Bismol) can help.  If this worsens or does not improve, please call  us. ° °Wash / shower every day.  You may shower over the dressings as they are waterproof.  Continue to shower over incision(s) after the dressing is off. °Remove your waterproof bandages 5 days after surgery.  You may leave the incision open to air.  You may have skin tapes (Steri Strips) covering the incision(s).  Leave them on until one week, then remove.  You may replace a dressing/Band-Aid to cover the incision for comfort if you wish.  ° °ACTIVITIES as tolerated:   °You may resume regular (light) daily activities beginning the next day--such as daily self-care, walking, climbing stairs--gradually increasing activities as tolerated.  If you can walk 30 minutes without difficulty, it is safe to try more intense activity such as jogging, treadmill, bicycling, low-impact aerobics, swimming, etc. °Save the most intensive and strenuous activity for last such as sit-ups, heavy lifting, contact sports, etc  Refrain from any heavy lifting or straining until you are off narcotics for pain control.   °DO NOT PUSH THROUGH PAIN.  Let pain be your guide: If it hurts to do something, don't do it.  Pain is your body warning you to avoid that activity for another week until the pain goes down. °You may drive when you are no longer taking prescription pain medication, you can comfortably wear a seatbelt, and you can safely maneuver your car and apply brakes. °You may have sexual intercourse when it is comfortable.  ° °FOLLOW UP in our office °Please call [facility] at [phone] to set up an appointment to see your surgeon in the office for a follow-up appointment approximately 2-3 weeks after your surgery. °Make sure that you call for this appointment the day you arrive home to insure a convenient appointment time. ° °9. IF YOU HAVE DISABILITY OR FAMILY LEAVE FORMS, BRING THEM TO THE OFFICE FOR PROCESSING.  DO NOT GIVE THEM TO YOUR DOCTOR. ° ° °WHEN TO CALL US [phone]: °Poor pain control °Reactions / problems with new  medications (rash/itching, nausea, etc)  °Fever over 101.5 F (38.5 C) °Worsening swelling or bruising °Continued bleeding from incision. °Increased pain, redness, or drainage from the incision °Difficulty breathing / swallowing ° ° The clinic staff is available to answer your questions during regular business hours (8:30am-5pm).  Please don’t hesitate to call and ask to speak to one of our nurses for clinical concerns.  ° If you have a medical emergency, go to the nearest emergency room or call 911. ° A surgeon from [facility] is always on call at the hospitals ° ° °[facility] °[address], [address], [place], [place]  [address] ? °MAIN: [phone] ? TOLL FREE: [phone] ?  °FAX [phone] °[email or web address] ° °#######################################################  ° ° °Post Anesthesia Home Care Instructions ° °Activity: °Get plenty of rest for the remainder of the day. A responsible individual must stay with you for 24 hours following the procedure.  °For the next 24 hours, DO NOT: °-Drive a car °-Operate machinery °-Drink alcoholic beverages °-Take any medication unless instructed by your physician °-Make any legal decisions or sign important papers. ° °Meals: °Start with liquid foods such as gelatin or soup. Progress to regular foods as tolerated. Avoid greasy, spicy, heavy foods.   If nausea and/or vomiting occur, drink only clear liquids until the nausea and/or vomiting subsides. Call your physician if vomiting continues. ° °Special Instructions/Symptoms: °Your throat may feel dry or sore from the anesthesia or the breathing tube placed in your throat during surgery. If this causes discomfort, gargle with warm salt water. The discomfort should disappear within 24 hours. ° °If you had a scopolamine patch placed behind your ear for the management of post- operative nausea and/or vomiting: ° °1. The medication in the patch is effective for 72 hours,  after which it should be removed.  Wrap patch in a tissue and discard in the trash. Wash hands thoroughly with soap and water. °2. You may remove the patch earlier than 72 hours if you experience unpleasant side effects which may include dry mouth, dizziness or visual disturbances. °3. Avoid touching the patch. Wash your hands with soap and water after contact with the patch. °    °

## 2022-11-01 NOTE — H&P (Signed)
istory of Present Illness: Nicole Nunez is a 77 y.o. female who is seen today for postop check after left axillary sentinel node mapping and port placement. She is doing well. Pathology showed no disease in the sentinel node. Her JP drain is draining minimal.    Review of Systems: A complete review of systems was obtained from the patient. I have reviewed this information and discussed as appropriate with the patient. See HPI as well for other ROS.    Medical History: Past Medical History: Diagnosis Date Diabetes mellitus without complication (CMS-HCC) History of cancer  There is no problem list on file for this patient.  Past Surgical History: Procedure Laterality Date CATARACT EXTRACTION   Allergies Allergen Reactions Statins-Hmg-Coa Reductase Inhibitors Other (See Comments) Rosuvastatin, atorvastatin, pravastatin, simvastatin  Current Outpatient Medications on File Prior to Visit Medication Sig Dispense Refill dapagliflozin propanediol (FARXIGA) 10 mg tablet Take 1 tablet by mouth once daily olmesartan (BENICAR) 20 MG tablet Take 1 tablet by mouth once daily OZEMPIC 0.25 mg or 0.5 mg (2 mg/3 mL) pen injector Inject 0.5mg  SQ weekly. pantoprazole (PROTONIX) 40 MG DR tablet Take 1 tablet by mouth once daily pitavastatin magnesium 4 mg Tab Take by mouth  No current facility-administered medications on file prior to visit.  Family History Problem Relation Age of Onset Breast cancer Sister   Social History  Tobacco Use Smoking Status Never Smokeless Tobacco Never   Social History  Socioeconomic History Marital status: Widowed Tobacco Use Smoking status: Never Smokeless tobacco: Never Vaping Use Vaping Use: Never used  Objective:  There were no vitals filed for this visit. There is no height or weight on file to calculate BMI.  Left axillary incision is healing well without seroma. JP drain removed. The open area posteriorly is about a centimeter  maximal diameter and is clean.  Port site on the right clean dry intact  Labs, Imaging and Diagnostic Testing:  A. LEFT AXILLARY SENTINEL LYMPH NODE, EXCISION: One benign lymph node, negative for carcinoma (0/1)  Assessment and Plan:  Diagnoses and all orders for this visit:  S/P lumpectomy of breast  Post-operative state Port in place  Plan port removal The procedure has been discussed with the patient.  Alternative therapies have been discussed with the patient.  Operative risks include bleeding,  Infection,  Organ injury,  Nerve injury,  Blood vessel injury,  DVT,  Pulmonary embolism,  Death,  And possible reoperation.  Medical management risks include worsening of present situation.  The success of the procedure is 50 -90 % at treating patients symptoms.  The patient understands and agrees to proceed.    Patient is healing well. JP drain removed today. Reviewed the sentinel node is negative. She will proceed with chemotherapy in a few weeks once her wounds have healed.  No follow-ups on file.

## 2022-11-01 NOTE — Anesthesia Postprocedure Evaluation (Signed)
Anesthesia Post Note  Patient: Nicole Nunez  Procedure(s) Performed: REMOVAL PORT-A-CATH (Chest)     Patient location during evaluation: PACU Anesthesia Type: MAC Level of consciousness: awake and alert Pain management: pain level controlled Vital Signs Assessment: post-procedure vital signs reviewed and stable Respiratory status: spontaneous breathing, nonlabored ventilation, respiratory function stable and patient connected to nasal cannula oxygen Cardiovascular status: stable and blood pressure returned to baseline Postop Assessment: no apparent nausea or vomiting Anesthetic complications: no   No notable events documented.  Last Vitals:  Vitals:   11/01/22 0945 11/01/22 1010  BP: (!) 153/65 (!) 165/75  Pulse: 77 83  Resp: 11 16  Temp:    SpO2: 100% 96%    Last Pain:  Vitals:   11/01/22 1010  TempSrc:   PainSc: 0-No pain                 Trevor Iha

## 2022-11-02 ENCOUNTER — Encounter (HOSPITAL_BASED_OUTPATIENT_CLINIC_OR_DEPARTMENT_OTHER): Payer: Self-pay | Admitting: Surgery

## 2022-11-02 ENCOUNTER — Ambulatory Visit
Admission: RE | Admit: 2022-11-02 | Discharge: 2022-11-02 | Disposition: A | Discharge status: Home / Self Care | Payer: Medicare Other | Source: Ambulatory Visit | Attending: Radiation Oncology | Admitting: Radiation Oncology

## 2022-11-02 ENCOUNTER — Other Ambulatory Visit: Payer: Self-pay

## 2022-11-02 DIAGNOSIS — Z170 Estrogen receptor positive status [ER+]: Secondary | ICD-10-CM | POA: Diagnosis not present

## 2022-11-02 DIAGNOSIS — C50512 Malignant neoplasm of lower-outer quadrant of left female breast: Secondary | ICD-10-CM | POA: Diagnosis not present

## 2022-11-02 DIAGNOSIS — Z510 Encounter for antineoplastic radiation therapy: Secondary | ICD-10-CM | POA: Diagnosis not present

## 2022-11-02 LAB — RAD ONC ARIA SESSION SUMMARY
Course Elapsed Days: 6
Plan Fractions Treated to Date: 5
Plan Prescribed Dose Per Fraction: 2.66 Gy
Plan Total Fractions Prescribed: 16
Plan Total Prescribed Dose: 42.56 Gy
Reference Point Dosage Given to Date: 13.3 Gy
Reference Point Session Dosage Given: 2.66 Gy
Session Number: 5

## 2022-11-03 ENCOUNTER — Ambulatory Visit
Admission: RE | Admit: 2022-11-03 | Discharge: 2022-11-03 | Disposition: A | Discharge status: Home / Self Care | Payer: Medicare Other | Source: Ambulatory Visit | Attending: Radiation Oncology | Admitting: Radiation Oncology

## 2022-11-03 ENCOUNTER — Other Ambulatory Visit: Payer: Self-pay

## 2022-11-03 DIAGNOSIS — C50512 Malignant neoplasm of lower-outer quadrant of left female breast: Secondary | ICD-10-CM | POA: Diagnosis not present

## 2022-11-03 DIAGNOSIS — Z170 Estrogen receptor positive status [ER+]: Secondary | ICD-10-CM | POA: Diagnosis not present

## 2022-11-03 DIAGNOSIS — Z510 Encounter for antineoplastic radiation therapy: Secondary | ICD-10-CM | POA: Diagnosis not present

## 2022-11-03 LAB — RAD ONC ARIA SESSION SUMMARY
Course Elapsed Days: 7
Plan Fractions Treated to Date: 6
Plan Prescribed Dose Per Fraction: 2.66 Gy
Plan Total Fractions Prescribed: 16
Plan Total Prescribed Dose: 42.56 Gy
Reference Point Dosage Given to Date: 15.96 Gy
Reference Point Session Dosage Given: 2.66 Gy
Session Number: 6

## 2022-11-04 ENCOUNTER — Other Ambulatory Visit: Payer: Self-pay

## 2022-11-04 ENCOUNTER — Ambulatory Visit
Admission: RE | Admit: 2022-11-04 | Discharge: 2022-11-04 | Disposition: A | Discharge status: Home / Self Care | Payer: Medicare Other | Source: Ambulatory Visit | Attending: Radiation Oncology | Admitting: Radiation Oncology

## 2022-11-04 DIAGNOSIS — Z170 Estrogen receptor positive status [ER+]: Secondary | ICD-10-CM | POA: Diagnosis not present

## 2022-11-04 DIAGNOSIS — Z510 Encounter for antineoplastic radiation therapy: Secondary | ICD-10-CM | POA: Diagnosis not present

## 2022-11-04 DIAGNOSIS — C50512 Malignant neoplasm of lower-outer quadrant of left female breast: Secondary | ICD-10-CM | POA: Diagnosis not present

## 2022-11-04 LAB — RAD ONC ARIA SESSION SUMMARY
Course Elapsed Days: 8
Plan Fractions Treated to Date: 7
Plan Prescribed Dose Per Fraction: 2.66 Gy
Plan Total Fractions Prescribed: 16
Plan Total Prescribed Dose: 42.56 Gy
Reference Point Dosage Given to Date: 18.62 Gy
Reference Point Session Dosage Given: 2.66 Gy
Session Number: 7

## 2022-11-07 ENCOUNTER — Ambulatory Visit
Admission: RE | Admit: 2022-11-07 | Discharge: 2022-11-07 | Disposition: A | Discharge status: Home / Self Care | Payer: Medicare Other | Source: Ambulatory Visit | Attending: Radiation Oncology | Admitting: Radiation Oncology

## 2022-11-07 ENCOUNTER — Other Ambulatory Visit: Payer: Self-pay

## 2022-11-07 ENCOUNTER — Ambulatory Visit: Payer: Medicare Other | Attending: Surgery

## 2022-11-07 VITALS — Wt 148.2 lb

## 2022-11-07 DIAGNOSIS — Z170 Estrogen receptor positive status [ER+]: Secondary | ICD-10-CM | POA: Diagnosis not present

## 2022-11-07 DIAGNOSIS — Z483 Aftercare following surgery for neoplasm: Secondary | ICD-10-CM | POA: Insufficient documentation

## 2022-11-07 DIAGNOSIS — Z510 Encounter for antineoplastic radiation therapy: Secondary | ICD-10-CM | POA: Diagnosis not present

## 2022-11-07 DIAGNOSIS — C50512 Malignant neoplasm of lower-outer quadrant of left female breast: Secondary | ICD-10-CM | POA: Diagnosis not present

## 2022-11-07 LAB — RAD ONC ARIA SESSION SUMMARY
Course Elapsed Days: 11
Plan Fractions Treated to Date: 8
Plan Prescribed Dose Per Fraction: 2.66 Gy
Plan Total Fractions Prescribed: 16
Plan Total Prescribed Dose: 42.56 Gy
Reference Point Dosage Given to Date: 21.28 Gy
Reference Point Session Dosage Given: 2.66 Gy
Session Number: 8

## 2022-11-07 NOTE — Therapy (Signed)
OUTPATIENT PHYSICAL THERAPY SOZO SCREENING NOTE   Patient Name: Nicole Nunez MRN: [id number] DOB:19-Dec-1945, 77 y.o., female Today's Date: 11/07/2022  PCP: Dorothyann Peng, MD REFERRING PROVIDER: Harriette Bouillon, MD   PT End of Session - 11/07/22 [phone]     Visit Number 1   # unchanged due to screen only   PT Start Time 0815    PT Stop Time 0821    PT Time Calculation (min) 6 min    Activity Tolerance Patient tolerated treatment well    Behavior During Therapy [facility] for tasks assessed/performed             Past Medical History:  Diagnosis Date   Arthritis    Breast cancer (HCC) 05/18/2022   CKD stage 3 due to type 2 diabetes mellitus (HCC)    DM (diabetes mellitus) (HCC)    GERD (gastroesophageal reflux disease)    High cholesterol    HTN (hypertension)    Past Surgical History:  Procedure Laterality Date   BREAST BIOPSY Left 05/18/2022   [place] LT BREAST BX W LOC DEV 1ST LESION IMG BX SPEC US GUIDE 05/18/2022 [facility]   BREAST BIOPSY Left 06/20/2022   [place] LT RADIOACTIVE SEED LOC 06/20/2022 [facility]   BREAST LUMPECTOMY WITH RADIOACTIVE SEED AND AXILLARY LYMPH NODE DISSECTION Left 06/21/2022   Procedure: LEFT BREAST LUMPECTOMY WITH RADIOACTIVE SEED AND AXILLARY NODE DISSECTION;  Surgeon: Harriette Bouillon, MD;  Location: [facility];  Service: General;  Laterality: Left;   CATARACT EXTRACTION, BILATERAL  01/2018   first was 12/2017   corn removal Bilateral 06/2017   EYE SURGERY     PORT-A-CATH REMOVAL N/A 11/01/2022   Procedure: REMOVAL PORT-A-CATH;  Surgeon: Harriette Bouillon, MD;  Location: [facility];  Service: General;  Laterality: N/A;   PORTACATH PLACEMENT N/A 07/21/2022   Procedure: INSERTION PORT-A-CATH;  Surgeon: Harriette Bouillon, MD;  Location: [facility];  Service: General;  Laterality: N/A;  90 MIN ROOM 4-OK'D PER KELLY   SENTINEL NODE BIOPSY Left 07/21/2022   Procedure: LEFT SENTINEL NODE BIOPSY;  Surgeon: Harriette Bouillon,  MD;  Location: [facility];  Service: General;  Laterality: Left;   Patient Active Problem List   Diagnosis Date Noted   Port-A-Cath in place 08/16/2022   Malignant neoplasm of lower-outer quadrant of left breast of female, estrogen receptor positive (HCC) 05/31/2022   Statin myopathy 02/04/2021   Notalgia 01/29/2020   Vitamin D deficiency disease 01/29/2020   Class 1 obesity due to excess calories with serious comorbidity and body mass index (BMI) of 32.0 to 32.9 in adult 01/29/2020   Type 2 diabetes mellitus with stage 3 chronic kidney disease, without long-term current use of insulin (HCC) 05/24/2018   Chronic renal disease, stage III (HCC) 05/24/2018   Parenchymal renal hypertension 05/24/2018   Pure hypercholesterolemia 05/24/2018   Class 1 obesity due to excess calories with serious comorbidity and body mass index (BMI) of 31.0 to 31.9 in adult 05/24/2018    REFERRING DIAG: left breast cancer at risk for lymphedema  THERAPY DIAG:  Aftercare following surgery for neoplasm  PERTINENT HISTORY: Patient was diagnosed left IDC. It measures 9mm and is located in the upper outer quadrant. It is ER/PR positive with a Ki67 of 50%.  lumpectomy and SLNB on 06/21/22 but with no lymph nodes revealed in tissue. Will be having chemotherapy TC followed by radiation as well as sentinel lymph node mapping.   PRECAUTIONS: left UE Lymphedema risk, None  SUBJECTIVE: Pt returns for  her first 3 month L-Dex screen.   PAIN:  Are you having pain? No  SOZO SCREENING: Patient was assessed today using the SOZO machine to determine the lymphedema index score. This was compared to her baseline score. It was determined that she is within the recommended range when compared to her baseline and no further action is needed at this time. She will continue SOZO screenings. These are done every 3 months for 2 years post operatively followed by every 6 months for 2 years, and then annually.   L-DEX FLOWSHEETS - 11/07/22  0800       L-DEX LYMPHEDEMA SCREENING   Measurement Type Unilateral    L-DEX MEASUREMENT EXTREMITY Upper Extremity    POSITION  Standing    DOMINANT SIDE Right    At Risk Side Left    BASELINE SCORE (UNILATERAL) 1.2    L-DEX SCORE (UNILATERAL) 2    VALUE CHANGE (UNILAT) 0.8               Hermenia Bers, PTA 11/07/2022, 8:21 AM

## 2022-11-08 ENCOUNTER — Other Ambulatory Visit: Payer: Self-pay

## 2022-11-08 ENCOUNTER — Ambulatory Visit
Admission: RE | Admit: 2022-11-08 | Discharge: 2022-11-08 | Disposition: A | Discharge status: Home / Self Care | Payer: Medicare Other | Source: Ambulatory Visit | Attending: Radiation Oncology | Admitting: Radiation Oncology

## 2022-11-08 DIAGNOSIS — Z510 Encounter for antineoplastic radiation therapy: Secondary | ICD-10-CM | POA: Diagnosis not present

## 2022-11-08 DIAGNOSIS — Z170 Estrogen receptor positive status [ER+]: Secondary | ICD-10-CM | POA: Diagnosis not present

## 2022-11-08 DIAGNOSIS — C50512 Malignant neoplasm of lower-outer quadrant of left female breast: Secondary | ICD-10-CM | POA: Diagnosis not present

## 2022-11-08 LAB — RAD ONC ARIA SESSION SUMMARY
Course Elapsed Days: 12
Plan Fractions Treated to Date: 9
Plan Prescribed Dose Per Fraction: 2.66 Gy
Plan Total Fractions Prescribed: 16
Plan Total Prescribed Dose: 42.56 Gy
Reference Point Dosage Given to Date: 23.94 Gy
Reference Point Session Dosage Given: 2.66 Gy
Session Number: 9

## 2022-11-09 ENCOUNTER — Ambulatory Visit
Admission: RE | Admit: 2022-11-09 | Discharge: 2022-11-09 | Disposition: A | Discharge status: Home / Self Care | Payer: Medicare Other | Source: Ambulatory Visit | Attending: Radiation Oncology | Admitting: Radiation Oncology

## 2022-11-09 ENCOUNTER — Other Ambulatory Visit: Payer: Self-pay

## 2022-11-09 DIAGNOSIS — C50512 Malignant neoplasm of lower-outer quadrant of left female breast: Secondary | ICD-10-CM | POA: Diagnosis not present

## 2022-11-09 DIAGNOSIS — Z170 Estrogen receptor positive status [ER+]: Secondary | ICD-10-CM | POA: Diagnosis not present

## 2022-11-09 DIAGNOSIS — Z510 Encounter for antineoplastic radiation therapy: Secondary | ICD-10-CM | POA: Diagnosis not present

## 2022-11-09 LAB — RAD ONC ARIA SESSION SUMMARY
Course Elapsed Days: 13
Plan Fractions Treated to Date: 10
Plan Prescribed Dose Per Fraction: 2.66 Gy
Plan Total Fractions Prescribed: 16
Plan Total Prescribed Dose: 42.56 Gy
Reference Point Dosage Given to Date: 26.6 Gy
Reference Point Session Dosage Given: 2.66 Gy
Session Number: 10

## 2022-11-10 ENCOUNTER — Other Ambulatory Visit: Payer: Self-pay

## 2022-11-10 ENCOUNTER — Ambulatory Visit
Admission: RE | Admit: 2022-11-10 | Discharge: 2022-11-10 | Disposition: A | Discharge status: Home / Self Care | Payer: Medicare Other | Source: Ambulatory Visit | Attending: Radiation Oncology | Admitting: Radiation Oncology

## 2022-11-10 DIAGNOSIS — C50512 Malignant neoplasm of lower-outer quadrant of left female breast: Secondary | ICD-10-CM | POA: Diagnosis not present

## 2022-11-10 DIAGNOSIS — Z510 Encounter for antineoplastic radiation therapy: Secondary | ICD-10-CM | POA: Diagnosis not present

## 2022-11-10 DIAGNOSIS — Z170 Estrogen receptor positive status [ER+]: Secondary | ICD-10-CM | POA: Diagnosis not present

## 2022-11-10 LAB — RAD ONC ARIA SESSION SUMMARY
Course Elapsed Days: 14
Plan Fractions Treated to Date: 11
Plan Prescribed Dose Per Fraction: 2.66 Gy
Plan Total Fractions Prescribed: 16
Plan Total Prescribed Dose: 42.56 Gy
Reference Point Dosage Given to Date: 29.26 Gy
Reference Point Session Dosage Given: 2.66 Gy
Session Number: 11

## 2022-11-11 ENCOUNTER — Ambulatory Visit: Payer: Medicare Other

## 2022-11-11 ENCOUNTER — Ambulatory Visit
Admission: RE | Admit: 2022-11-11 | Discharge: 2022-11-11 | Disposition: A | Discharge status: Home / Self Care | Payer: Medicare Other | Source: Ambulatory Visit | Attending: Radiation Oncology | Admitting: Radiation Oncology

## 2022-11-11 ENCOUNTER — Other Ambulatory Visit: Payer: Self-pay

## 2022-11-11 DIAGNOSIS — Z170 Estrogen receptor positive status [ER+]: Secondary | ICD-10-CM | POA: Diagnosis not present

## 2022-11-11 DIAGNOSIS — C50512 Malignant neoplasm of lower-outer quadrant of left female breast: Secondary | ICD-10-CM | POA: Diagnosis not present

## 2022-11-11 DIAGNOSIS — Z510 Encounter for antineoplastic radiation therapy: Secondary | ICD-10-CM | POA: Diagnosis not present

## 2022-11-11 LAB — RAD ONC ARIA SESSION SUMMARY
Course Elapsed Days: 15
Plan Fractions Treated to Date: 12
Plan Prescribed Dose Per Fraction: 2.66 Gy
Plan Total Fractions Prescribed: 16
Plan Total Prescribed Dose: 42.56 Gy
Reference Point Dosage Given to Date: 31.92 Gy
Reference Point Session Dosage Given: 2.66 Gy
Session Number: 12

## 2022-11-14 ENCOUNTER — Ambulatory Visit
Admission: RE | Admit: 2022-11-14 | Discharge: 2022-11-14 | Disposition: A | Discharge status: Home / Self Care | Payer: Medicare Other | Source: Ambulatory Visit | Attending: Radiation Oncology | Admitting: Radiation Oncology

## 2022-11-14 ENCOUNTER — Other Ambulatory Visit: Payer: Self-pay

## 2022-11-14 DIAGNOSIS — Z510 Encounter for antineoplastic radiation therapy: Secondary | ICD-10-CM | POA: Diagnosis not present

## 2022-11-14 DIAGNOSIS — Z170 Estrogen receptor positive status [ER+]: Secondary | ICD-10-CM | POA: Diagnosis not present

## 2022-11-14 DIAGNOSIS — C50512 Malignant neoplasm of lower-outer quadrant of left female breast: Secondary | ICD-10-CM | POA: Diagnosis not present

## 2022-11-14 LAB — RAD ONC ARIA SESSION SUMMARY
Course Elapsed Days: 18
Plan Fractions Treated to Date: 13
Plan Prescribed Dose Per Fraction: 2.66 Gy
Plan Total Fractions Prescribed: 16
Plan Total Prescribed Dose: 42.56 Gy
Reference Point Dosage Given to Date: 34.58 Gy
Reference Point Session Dosage Given: 2.66 Gy
Session Number: 13

## 2022-11-15 ENCOUNTER — Ambulatory Visit
Admission: RE | Admit: 2022-11-15 | Discharge: 2022-11-15 | Disposition: A | Discharge status: Home / Self Care | Payer: Medicare Other | Source: Ambulatory Visit | Attending: Radiation Oncology | Admitting: Radiation Oncology

## 2022-11-15 ENCOUNTER — Other Ambulatory Visit: Payer: Self-pay

## 2022-11-15 DIAGNOSIS — C50512 Malignant neoplasm of lower-outer quadrant of left female breast: Secondary | ICD-10-CM | POA: Diagnosis not present

## 2022-11-15 DIAGNOSIS — Z510 Encounter for antineoplastic radiation therapy: Secondary | ICD-10-CM | POA: Diagnosis not present

## 2022-11-15 DIAGNOSIS — Z170 Estrogen receptor positive status [ER+]: Secondary | ICD-10-CM | POA: Diagnosis not present

## 2022-11-15 LAB — RAD ONC ARIA SESSION SUMMARY
Course Elapsed Days: 19
Plan Fractions Treated to Date: 14
Plan Prescribed Dose Per Fraction: 2.66 Gy
Plan Total Fractions Prescribed: 16
Plan Total Prescribed Dose: 42.56 Gy
Reference Point Dosage Given to Date: 37.24 Gy
Reference Point Session Dosage Given: 1.2868 Gy
Session Number: 14

## 2022-11-16 ENCOUNTER — Other Ambulatory Visit: Payer: Self-pay

## 2022-11-16 ENCOUNTER — Ambulatory Visit
Admission: RE | Admit: 2022-11-16 | Discharge: 2022-11-16 | Disposition: A | Discharge status: Home / Self Care | Payer: Medicare Other | Source: Ambulatory Visit | Attending: Radiation Oncology | Admitting: Radiation Oncology

## 2022-11-16 DIAGNOSIS — Z170 Estrogen receptor positive status [ER+]: Secondary | ICD-10-CM | POA: Diagnosis not present

## 2022-11-16 DIAGNOSIS — C50512 Malignant neoplasm of lower-outer quadrant of left female breast: Secondary | ICD-10-CM | POA: Diagnosis not present

## 2022-11-16 DIAGNOSIS — Z510 Encounter for antineoplastic radiation therapy: Secondary | ICD-10-CM | POA: Diagnosis not present

## 2022-11-16 LAB — RAD ONC ARIA SESSION SUMMARY
Course Elapsed Days: 20
Plan Fractions Treated to Date: 15
Plan Prescribed Dose Per Fraction: 2.66 Gy
Plan Total Fractions Prescribed: 16
Plan Total Prescribed Dose: 42.56 Gy
Reference Point Dosage Given to Date: 39.9 Gy
Reference Point Session Dosage Given: 2.66 Gy
Session Number: 15

## 2022-11-17 ENCOUNTER — Other Ambulatory Visit: Payer: Self-pay

## 2022-11-17 ENCOUNTER — Ambulatory Visit
Admission: RE | Admit: 2022-11-17 | Discharge: 2022-11-17 | Disposition: A | Discharge status: Home / Self Care | Payer: Medicare Other | Source: Ambulatory Visit | Attending: Radiation Oncology | Admitting: Radiation Oncology

## 2022-11-17 DIAGNOSIS — Z510 Encounter for antineoplastic radiation therapy: Secondary | ICD-10-CM | POA: Diagnosis not present

## 2022-11-17 DIAGNOSIS — C50512 Malignant neoplasm of lower-outer quadrant of left female breast: Secondary | ICD-10-CM | POA: Diagnosis not present

## 2022-11-17 DIAGNOSIS — Z170 Estrogen receptor positive status [ER+]: Secondary | ICD-10-CM | POA: Diagnosis not present

## 2022-11-17 LAB — RAD ONC ARIA SESSION SUMMARY
Course Elapsed Days: 21
Plan Fractions Treated to Date: 16
Plan Prescribed Dose Per Fraction: 2.66 Gy
Plan Total Fractions Prescribed: 16
Plan Total Prescribed Dose: 42.56 Gy
Reference Point Dosage Given to Date: 42.56 Gy
Reference Point Session Dosage Given: 2.66 Gy
Session Number: 16

## 2022-11-18 ENCOUNTER — Other Ambulatory Visit: Payer: Self-pay

## 2022-11-18 ENCOUNTER — Ambulatory Visit
Admission: RE | Admit: 2022-11-18 | Discharge: 2022-11-18 | Disposition: A | Discharge status: Home / Self Care | Payer: Medicare Other | Source: Ambulatory Visit | Attending: Radiation Oncology | Admitting: Radiation Oncology

## 2022-11-18 DIAGNOSIS — Z510 Encounter for antineoplastic radiation therapy: Secondary | ICD-10-CM | POA: Diagnosis not present

## 2022-11-18 DIAGNOSIS — C50512 Malignant neoplasm of lower-outer quadrant of left female breast: Secondary | ICD-10-CM | POA: Diagnosis not present

## 2022-11-18 DIAGNOSIS — Z170 Estrogen receptor positive status [ER+]: Secondary | ICD-10-CM | POA: Diagnosis not present

## 2022-11-18 LAB — RAD ONC ARIA SESSION SUMMARY
Course Elapsed Days: 22
Plan Fractions Treated to Date: 1
Plan Prescribed Dose Per Fraction: 2 Gy
Plan Total Fractions Prescribed: 4
Plan Total Prescribed Dose: 8 Gy
Reference Point Dosage Given to Date: 2 Gy
Reference Point Session Dosage Given: 2 Gy
Session Number: 17

## 2022-11-20 NOTE — Progress Notes (Signed)
Patient Care Team: Dorothyann Peng, MD as PCP - General (Internal Medicine) Harlan Stains, [facility] (Inactive) (Pharmacist) Serena Croissant, MD as Consulting Physician (Hematology and Oncology) Donnelly Angelica, RN as Oncology Nurse Navigator Pershing Proud, RN as Oncology Nurse Navigator Harriette Bouillon, MD as Consulting Physician (General Surgery) Dorothy Puffer, MD as Consulting Physician (Radiation Oncology)  DIAGNOSIS:  Encounter Diagnosis  Name Primary?   Malignant neoplasm of lower-outer quadrant of left breast of female, estrogen receptor positive (HCC) Yes    SUMMARY OF ONCOLOGIC HISTORY: Oncology History  Malignant neoplasm of lower-outer quadrant of left breast of female, estrogen receptor positive (HCC)  05/18/2022 Initial Diagnosis   05/18/2022: Screening mammogram detected left breast mass by ultrasound it measured 9 mm, biopsy revealed grade 2 IDC ER 90%, PR 15%, Ki-67 50%, HER2 2+ by IHC, FISH negative ratio 2.17, Copy #3.9, additional 20 cells were examined ratio came at 2.19 and copy #3.72   06/05/2022 Cancer Staging   Staging form: Breast, AJCC 8th Edition - Clinical: Stage IA (cT1b, cN0, cM0, G2, ER+, PR+, HER2-) - Signed by Ronny Bacon, PA-C on 06/05/2022 Method of lymph node assessment: Clinical Histologic grading system: 3 grade system   06/21/2022 Surgery   Left lumpectomy: Grade 3 IDC 1.5 cm, margins negative, no lymph node (adipose tissue was in the sentinel node) ER 90% weak to moderate, PR 15% weak to strong, HER2 negative, Ki-67 50% Repeat prognostic panel: ER 0%, PR 0%, Ki-67 35%, HER2 2+ by IHC FISH negative ratio 1.72 (triple negative breast cancer)   08/09/2022 -  Chemotherapy   Patient is on Treatment Plan : BREAST TC q21d     10/10/2022 Cancer Staging   Staging form: Breast, AJCC 8th Edition - Pathologic stage from 10/10/2022: Stage IB (pT1c, pN0, cM0, G3, ER-, PR-, HER2-) - Signed by Ronny Bacon, PA-C on 10/10/2022 Histologic  grading system: 3 grade system     CHIEF COMPLIANT: Follow-up after radiation  INTERVAL HISTORY: Nicole Nunez is a 77 y.o. female is here because of above-mentioned history of left breast cancer she underwent lumpectomy and apparently prognostic panel came back as triple negative disease. She presents to the clinic for a follow-up. Pt. Reports radiation went well. She has no complaints or concerns to report to the clinic today.  ALLERGIES:  is allergic to statins.  MEDICATIONS:  Current Outpatient Medications  Medication Sig Dispense Refill   Ascorbic Acid (VITAMIN C) 1000 MG tablet Take 1,000 mg by mouth daily.     aspirin EC 81 MG tablet Take 81 mg by mouth daily.     b complex vitamins tablet Take 1 tablet by mouth daily.     Calcium Carbonate (CALCIUM 600 PO) Take 600 mg by mouth daily.     Cholecalciferol (VITAMIN D3) 125 MCG (5000 UT) CAPS Take 5,000 Units by mouth daily.     dapagliflozin propanediol (FARXIGA) 10 MG TABS tablet Take 1 tablet (10 mg total) by mouth daily. 90 tablet 2   hydrOXYzine (ATARAX) 10 MG tablet TAKE 1-2 TABLETS BY MOUTH EVERY EVENING AS NEEDED 180 tablet 1   Lancet Devices (ONE TOUCH DELICA LANCING DEV) MISC USE AS DIRECTED TO CHECK BLOOD SUGARS ONCE DAILY dx: e11.22 1 each 2   loratadine (CLARITIN) 10 MG tablet TAKE 1 TABLET BY MOUTH EVERY DAY 90 tablet 1   magnesium gluconate (MAGONATE) 500 MG tablet Take 500 mg by mouth daily.     Multiple Vitamin (MULTIVITAMIN) tablet Take 1 tablet by  mouth daily.     olmesartan (BENICAR) 20 MG tablet Take 1 tablet (20 mg total) by mouth daily. 100 tablet 2   ONETOUCH VERIO test strip USE TO CHECK BLOOD SUGAR DAILY 100 strip 2   Pitavastatin Magnesium 4 MG TABS Take 1 tablet (4 mg total) by mouth daily. 30 tablet 11   Semaglutide,0.25 or 0.5MG /DOS, (OZEMPIC, 0.25 OR 0.5 MG/DOSE,) 2 MG/3ML SOPN Inject 0.5mg  SQ weekly. 3 mL 0   traMADol (ULTRAM) 50 MG tablet Take 1 tablet (50 mg total) by mouth every 6 (six) hours as  needed. 20 tablet 0   zinc gluconate 50 MG tablet Take 50 mg by mouth daily.     No current facility-administered medications for this visit.    PHYSICAL EXAMINATION: ECOG PERFORMANCE STATUS: 1 - Symptomatic but completely ambulatory  Vitals:   11/28/22 1507  BP: (!) 143/70  Pulse: 81  Resp: 18  Temp: 97.7 F (36.5 C)  SpO2: 98%   Filed Weights   11/28/22 1507  Weight: 148 lb 6.4 oz (67.3 kg)    BREAST: No palpable masses or nodules in either right or left breasts. No palpable axillary supraclavicular or infraclavicular adenopathy no breast tenderness or nipple discharge. (exam performed in the presence of a chaperone)  LABORATORY DATA:  I have reviewed the data as listed    Latest Ref Rng & Units 10/25/2022   12:13 PM 09/20/2022    9:28 AM 08/30/2022    9:02 AM  CMP  Glucose 70 - 99 mg/dL 981  191  478   BUN 8 - 23 mg/dL 16  26  20    Creatinine 0.44 - 1.00 mg/dL 2.95  6.21  3.08   Sodium 135 - 145 mmol/L 138  138  140   Potassium 3.5 - 5.1 mmol/L 4.2  4.5  4.3   Chloride 98 - 111 mmol/L 107  104  106   CO2 22 - 32 mmol/L 26  27  28    Calcium 8.9 - 10.3 mg/dL 8.7  9.1  9.2   Total Protein 6.5 - 8.1 g/dL  6.4  6.7   Total Bilirubin 0.3 - 1.2 mg/dL  0.5  0.5   Alkaline Phos 38 - 126 U/L  106  105   AST 15 - 41 U/L  27  22   ALT 0 - 44 U/L  30  20     Lab Results  Component Value Date   WBC 7.3 09/20/2022   HGB 9.6 (L) 09/20/2022   HCT 29.0 (L) 09/20/2022   MCV 87.3 09/20/2022   PLT 321 09/20/2022   NEUTROABS 5.4 09/20/2022    ASSESSMENT & PLAN:  Malignant neoplasm of lower-outer quadrant of left breast of female, estrogen receptor positive (HCC) 05/18/2022: Screening mammogram detected left breast mass by ultrasound it measured 9 mm, biopsy revealed grade 2 IDC ER 90%, PR 15%, Ki-67 50%, HER2 2+ by IHC, FISH negative ratio 2.17, Copy #3.9, additional 20 cells were examined ratio came at 2.19 and copy #3.72    06/21/2022: Left lumpectomy: Grade 3 IDC 1.5 cm,  margins negative, no lymph node (adipose tissue was in the sentinel node) ER 90% weak to moderate, PR 15% weak to strong, HER2 negative, Ki-67 50% Repeat prognostic panel: ER 0%, PR 0%, Ki-67 35%, HER2 2+ by IHC FISH negative ratio 1.72 (triple negative breast cancer)   Treatment plan: Oncotype DX testing was canceled because the cancer was triple negative.  Recommend adjuvant chemotherapy with Taxotere and Cytoxan every  3 weeks x 3 completed 09/20/2022 Adjuvant radiation therapy completed 11/23/2022 Adjuvant antiestrogen therapy started 11/28/2022 -----------------------------------------------------------------------------------------------------------------------------------  Current treatment: Adjuvant antiestrogen therapy with anastrozole: Discussed risks and benefits and felt that because the final pathology showed ER/PR negativity there is no role for antiestrogen treatment.  We will arrange for [employer] based minimal residual disease testing.  Return to clinic in 3 months for survivorship care plan visit    No orders of the defined types were placed in this encounter.  The patient has a good understanding of the overall plan. she agrees with it. she will call with any problems that may develop before the next visit here. Total time spent: 30 mins including face to face time and time spent for planning, charting and co-ordination of care   Tamsen Meek, MD 11/28/22    I Janan Ridge am acting as a Neurosurgeon for [employer]  I have reviewed the above documentation for accuracy and completeness, and I agree with the above.

## 2022-11-21 ENCOUNTER — Ambulatory Visit
Admission: RE | Admit: 2022-11-21 | Discharge: 2022-11-21 | Disposition: A | Discharge status: Home / Self Care | Payer: Medicare Other | Source: Ambulatory Visit | Attending: Radiation Oncology | Admitting: Radiation Oncology

## 2022-11-21 ENCOUNTER — Other Ambulatory Visit: Payer: Self-pay

## 2022-11-21 DIAGNOSIS — E1122 Type 2 diabetes mellitus with diabetic chronic kidney disease: Secondary | ICD-10-CM | POA: Insufficient documentation

## 2022-11-21 DIAGNOSIS — G8929 Other chronic pain: Secondary | ICD-10-CM | POA: Insufficient documentation

## 2022-11-21 DIAGNOSIS — T466X5A Adverse effect of antihyperlipidemic and antiarteriosclerotic drugs, initial encounter: Secondary | ICD-10-CM | POA: Diagnosis not present

## 2022-11-21 DIAGNOSIS — Z6831 Body mass index (BMI) 31.0-31.9, adult: Secondary | ICD-10-CM | POA: Insufficient documentation

## 2022-11-21 DIAGNOSIS — E7800 Pure hypercholesterolemia, unspecified: Secondary | ICD-10-CM | POA: Diagnosis not present

## 2022-11-21 DIAGNOSIS — E559 Vitamin D deficiency, unspecified: Secondary | ICD-10-CM | POA: Insufficient documentation

## 2022-11-21 DIAGNOSIS — Z95828 Presence of other vascular implants and grafts: Secondary | ICD-10-CM | POA: Diagnosis not present

## 2022-11-21 DIAGNOSIS — N1831 Chronic kidney disease, stage 3a: Secondary | ICD-10-CM | POA: Diagnosis not present

## 2022-11-21 DIAGNOSIS — G720 Drug-induced myopathy: Secondary | ICD-10-CM | POA: Insufficient documentation

## 2022-11-21 DIAGNOSIS — C50512 Malignant neoplasm of lower-outer quadrant of left female breast: Secondary | ICD-10-CM | POA: Diagnosis not present

## 2022-11-21 DIAGNOSIS — Z6832 Body mass index (BMI) 32.0-32.9, adult: Secondary | ICD-10-CM | POA: Insufficient documentation

## 2022-11-21 DIAGNOSIS — Z510 Encounter for antineoplastic radiation therapy: Secondary | ICD-10-CM | POA: Insufficient documentation

## 2022-11-21 DIAGNOSIS — I129 Hypertensive chronic kidney disease with stage 1 through stage 4 chronic kidney disease, or unspecified chronic kidney disease: Secondary | ICD-10-CM | POA: Insufficient documentation

## 2022-11-21 DIAGNOSIS — Z170 Estrogen receptor positive status [ER+]: Secondary | ICD-10-CM | POA: Insufficient documentation

## 2022-11-21 DIAGNOSIS — M549 Dorsalgia, unspecified: Secondary | ICD-10-CM | POA: Diagnosis not present

## 2022-11-21 DIAGNOSIS — E6609 Other obesity due to excess calories: Secondary | ICD-10-CM | POA: Insufficient documentation

## 2022-11-21 LAB — RAD ONC ARIA SESSION SUMMARY
Course Elapsed Days: 25
Plan Fractions Treated to Date: 2
Plan Prescribed Dose Per Fraction: 2 Gy
Plan Total Fractions Prescribed: 4
Plan Total Prescribed Dose: 8 Gy
Reference Point Dosage Given to Date: 4 Gy
Reference Point Session Dosage Given: 2 Gy
Session Number: 18

## 2022-11-22 ENCOUNTER — Other Ambulatory Visit: Payer: Self-pay

## 2022-11-22 ENCOUNTER — Ambulatory Visit
Admission: RE | Admit: 2022-11-22 | Discharge: 2022-11-22 | Disposition: A | Discharge status: Home / Self Care | Payer: Medicare Other | Source: Ambulatory Visit | Attending: Radiation Oncology | Admitting: Radiation Oncology

## 2022-11-22 DIAGNOSIS — Z170 Estrogen receptor positive status [ER+]: Secondary | ICD-10-CM | POA: Diagnosis not present

## 2022-11-22 DIAGNOSIS — E559 Vitamin D deficiency, unspecified: Secondary | ICD-10-CM | POA: Diagnosis not present

## 2022-11-22 DIAGNOSIS — T466X5A Adverse effect of antihyperlipidemic and antiarteriosclerotic drugs, initial encounter: Secondary | ICD-10-CM | POA: Diagnosis not present

## 2022-11-22 DIAGNOSIS — Z95828 Presence of other vascular implants and grafts: Secondary | ICD-10-CM | POA: Diagnosis not present

## 2022-11-22 DIAGNOSIS — E1122 Type 2 diabetes mellitus with diabetic chronic kidney disease: Secondary | ICD-10-CM | POA: Diagnosis not present

## 2022-11-22 DIAGNOSIS — C50512 Malignant neoplasm of lower-outer quadrant of left female breast: Secondary | ICD-10-CM | POA: Diagnosis not present

## 2022-11-22 DIAGNOSIS — G720 Drug-induced myopathy: Secondary | ICD-10-CM | POA: Diagnosis not present

## 2022-11-22 DIAGNOSIS — Z510 Encounter for antineoplastic radiation therapy: Secondary | ICD-10-CM | POA: Diagnosis not present

## 2022-11-22 DIAGNOSIS — N1831 Chronic kidney disease, stage 3a: Secondary | ICD-10-CM | POA: Diagnosis not present

## 2022-11-22 DIAGNOSIS — I129 Hypertensive chronic kidney disease with stage 1 through stage 4 chronic kidney disease, or unspecified chronic kidney disease: Secondary | ICD-10-CM | POA: Diagnosis not present

## 2022-11-22 DIAGNOSIS — G8929 Other chronic pain: Secondary | ICD-10-CM | POA: Diagnosis not present

## 2022-11-22 DIAGNOSIS — E7800 Pure hypercholesterolemia, unspecified: Secondary | ICD-10-CM | POA: Diagnosis not present

## 2022-11-22 DIAGNOSIS — M549 Dorsalgia, unspecified: Secondary | ICD-10-CM | POA: Diagnosis not present

## 2022-11-22 LAB — RAD ONC ARIA SESSION SUMMARY
Course Elapsed Days: 26
Plan Fractions Treated to Date: 3
Plan Prescribed Dose Per Fraction: 2 Gy
Plan Total Fractions Prescribed: 4
Plan Total Prescribed Dose: 8 Gy
Reference Point Dosage Given to Date: 6 Gy
Reference Point Session Dosage Given: 2 Gy
Session Number: 19

## 2022-11-23 ENCOUNTER — Ambulatory Visit
Admission: RE | Admit: 2022-11-23 | Discharge: 2022-11-23 | Disposition: A | Discharge status: Home / Self Care | Payer: Medicare Other | Source: Ambulatory Visit | Attending: Radiation Oncology | Admitting: Radiation Oncology

## 2022-11-23 ENCOUNTER — Other Ambulatory Visit: Payer: Self-pay

## 2022-11-23 DIAGNOSIS — Z95828 Presence of other vascular implants and grafts: Secondary | ICD-10-CM | POA: Diagnosis not present

## 2022-11-23 DIAGNOSIS — N183 Chronic kidney disease, stage 3 unspecified: Secondary | ICD-10-CM

## 2022-11-23 DIAGNOSIS — Z510 Encounter for antineoplastic radiation therapy: Secondary | ICD-10-CM | POA: Diagnosis not present

## 2022-11-23 DIAGNOSIS — I129 Hypertensive chronic kidney disease with stage 1 through stage 4 chronic kidney disease, or unspecified chronic kidney disease: Secondary | ICD-10-CM | POA: Diagnosis not present

## 2022-11-23 DIAGNOSIS — G720 Drug-induced myopathy: Secondary | ICD-10-CM | POA: Diagnosis not present

## 2022-11-23 DIAGNOSIS — T466X5A Adverse effect of antihyperlipidemic and antiarteriosclerotic drugs, initial encounter: Secondary | ICD-10-CM | POA: Diagnosis not present

## 2022-11-23 DIAGNOSIS — E559 Vitamin D deficiency, unspecified: Secondary | ICD-10-CM

## 2022-11-23 DIAGNOSIS — Z170 Estrogen receptor positive status [ER+]: Secondary | ICD-10-CM | POA: Diagnosis not present

## 2022-11-23 DIAGNOSIS — C50512 Malignant neoplasm of lower-outer quadrant of left female breast: Secondary | ICD-10-CM | POA: Diagnosis not present

## 2022-11-23 DIAGNOSIS — E7800 Pure hypercholesterolemia, unspecified: Secondary | ICD-10-CM | POA: Diagnosis not present

## 2022-11-23 DIAGNOSIS — E1122 Type 2 diabetes mellitus with diabetic chronic kidney disease: Secondary | ICD-10-CM | POA: Diagnosis not present

## 2022-11-23 DIAGNOSIS — G8929 Other chronic pain: Secondary | ICD-10-CM | POA: Diagnosis not present

## 2022-11-23 DIAGNOSIS — N1831 Chronic kidney disease, stage 3a: Secondary | ICD-10-CM | POA: Diagnosis not present

## 2022-11-23 DIAGNOSIS — M549 Dorsalgia, unspecified: Secondary | ICD-10-CM | POA: Diagnosis not present

## 2022-11-23 DIAGNOSIS — E6609 Other obesity due to excess calories: Secondary | ICD-10-CM

## 2022-11-23 LAB — RAD ONC ARIA SESSION SUMMARY
Course Elapsed Days: 27
Plan Fractions Treated to Date: 4
Plan Prescribed Dose Per Fraction: 2 Gy
Plan Total Fractions Prescribed: 4
Plan Total Prescribed Dose: 8 Gy
Reference Point Dosage Given to Date: 8 Gy
Reference Point Session Dosage Given: 2 Gy
Session Number: 20

## 2022-11-25 NOTE — Radiation Completion Notes (Addendum)
  Radiation Oncology         [phone] ________________________________  Name: Nicole Nunez MRN: [id number]  Date of Service: 11/23/2022  DOB: December 27, 1945  End of Treatment Note     Diagnosis: Stage IB, pT1cN0M0, grade 3, triple negative invasive ductal carcinoma of the left breast.   Intent: Curative     ==========DELIVERED PLANS==========  First Treatment Date: 2022-10-27 - Last Treatment Date: 2022-11-23   Plan Name: Breast_L_BH Site: Breast, Left Technique: 3D Mode: Photon Dose Per Fraction: 2.66 Gy Prescribed Dose (Delivered / Prescribed): 42.56 Gy / 42.56 Gy Prescribed Fxs (Delivered / Prescribed): 16 / 16   Plan Name: Brst_L_Bst_BH Site: Breast, Left Technique: 3D Mode: Photon Dose Per Fraction: 2 Gy Prescribed Dose (Delivered / Prescribed): 8 Gy / 8 Gy Prescribed Fxs (Delivered / Prescribed): 4 / 4     ==========ON TREATMENT VISIT DATES========== 2022-10-28, 2022-11-04, 2022-11-11, 2022-11-18    See weekly On Treatment Notes in Epic for details. The patient tolerated radiation. She developed  anticipated skin changes in the treatment field.   The patient will receive a call in about one month from the radiation oncology department. She will continue follow up with Dr. Pamelia Hoit as well.      Osker Mason, PACs

## 2022-11-28 ENCOUNTER — Inpatient Hospital Stay: Payer: Medicare Other | Attending: Hematology and Oncology | Admitting: Hematology and Oncology

## 2022-11-28 VITALS — BP 143/70 | HR 81 | Temp 97.7°F | Resp 18 | Ht <= 58 in | Wt 148.4 lb

## 2022-11-28 DIAGNOSIS — C50512 Malignant neoplasm of lower-outer quadrant of left female breast: Secondary | ICD-10-CM | POA: Diagnosis not present

## 2022-11-28 DIAGNOSIS — Z79811 Long term (current) use of aromatase inhibitors: Secondary | ICD-10-CM | POA: Insufficient documentation

## 2022-11-28 DIAGNOSIS — Z170 Estrogen receptor positive status [ER+]: Secondary | ICD-10-CM | POA: Diagnosis not present

## 2022-11-28 DIAGNOSIS — Z923 Personal history of irradiation: Secondary | ICD-10-CM | POA: Diagnosis not present

## 2022-11-28 NOTE — Assessment & Plan Note (Addendum)
05/18/2022: Screening mammogram detected left breast mass by ultrasound it measured 9 mm, biopsy revealed grade 2 IDC ER 90%, PR 15%, Ki-67 50%, HER2 2+ by IHC, FISH negative ratio 2.17, Copy #3.9, additional 20 cells were examined ratio came at 2.19 and copy #3.72    06/21/2022: Left lumpectomy: Grade 3 IDC 1.5 cm, margins negative, no lymph node (adipose tissue was in the sentinel node) ER 90% weak to moderate, PR 15% weak to strong, HER2 negative, Ki-67 50% Repeat prognostic panel: ER 0%, PR 0%, Ki-67 35%, HER2 2+ by IHC FISH negative ratio 1.72 (triple negative breast cancer)   Treatment plan: Oncotype DX testing was canceled because the cancer was triple negative.  Recommend adjuvant chemotherapy with Taxotere and Cytoxan every 3 weeks x 3 completed 09/20/2022 Adjuvant radiation therapy completed 11/23/2022 Adjuvant antiestrogen therapy started 11/28/2022 -----------------------------------------------------------------------------------------------------------------------------------  Current treatment: Adjuvant antiestrogen therapy with anastrozole: Discussed risks and benefits and felt that because the final pathology showed ER/PR negativity there is no role for antiestrogen treatment.  We will arrange for [employer] based minimal residual disease testing.  Return to clinic in 3 months for survivorship care plan visit

## 2022-11-30 ENCOUNTER — Telehealth: Payer: Self-pay

## 2022-11-30 DIAGNOSIS — C50512 Malignant neoplasm of lower-outer quadrant of left female breast: Secondary | ICD-10-CM

## 2022-11-30 NOTE — Telephone Encounter (Signed)
Per md orders entered for [employer]ater[employer]. Requisition and all supported documents faxed to [phone]. Faxed confirmation was received.  

## 2022-12-12 ENCOUNTER — Other Ambulatory Visit: Payer: Self-pay

## 2022-12-12 DIAGNOSIS — Z170 Estrogen receptor positive status [ER+]: Secondary | ICD-10-CM | POA: Diagnosis not present

## 2022-12-12 DIAGNOSIS — C50512 Malignant neoplasm of lower-outer quadrant of left female breast: Secondary | ICD-10-CM | POA: Diagnosis not present

## 2022-12-23 ENCOUNTER — Ambulatory Visit
Admission: RE | Admit: 2022-12-23 | Discharge: 2022-12-23 | Disposition: A | Discharge status: Home / Self Care | Payer: Medicare Other | Source: Ambulatory Visit | Attending: Internal Medicine | Admitting: Internal Medicine

## 2022-12-23 DIAGNOSIS — N186 End stage renal disease: Secondary | ICD-10-CM | POA: Diagnosis not present

## 2022-12-23 DIAGNOSIS — Z853 Personal history of malignant neoplasm of breast: Secondary | ICD-10-CM | POA: Diagnosis not present

## 2022-12-23 DIAGNOSIS — E349 Endocrine disorder, unspecified: Secondary | ICD-10-CM | POA: Diagnosis not present

## 2022-12-23 DIAGNOSIS — M8588 Other specified disorders of bone density and structure, other site: Secondary | ICD-10-CM | POA: Diagnosis not present

## 2022-12-23 DIAGNOSIS — E2839 Other primary ovarian failure: Secondary | ICD-10-CM

## 2022-12-27 ENCOUNTER — Encounter (HOSPITAL_COMMUNITY): Payer: Self-pay

## 2023-01-04 ENCOUNTER — Telehealth: Payer: Self-pay

## 2023-01-04 NOTE — Telephone Encounter (Signed)
Attempted to call pt regarding signatera results lvm for pt that [employer] results was negative and they will be back out in the next 3-6 months to repeat labs.

## 2023-01-16 ENCOUNTER — Other Ambulatory Visit: Payer: Self-pay

## 2023-01-16 DIAGNOSIS — M79675 Pain in left toe(s): Secondary | ICD-10-CM | POA: Diagnosis not present

## 2023-01-16 DIAGNOSIS — L84 Corns and callosities: Secondary | ICD-10-CM | POA: Diagnosis not present

## 2023-01-16 DIAGNOSIS — B351 Tinea unguium: Secondary | ICD-10-CM | POA: Diagnosis not present

## 2023-01-16 DIAGNOSIS — E118 Type 2 diabetes mellitus with unspecified complications: Secondary | ICD-10-CM | POA: Diagnosis not present

## 2023-01-16 MED ORDER — ONETOUCH DELICA LANCING DEV MISC: 2 refills | Status: DC

## 2023-01-30 ENCOUNTER — Ambulatory Visit: Payer: Medicare Other | Attending: Surgery

## 2023-01-30 ENCOUNTER — Ambulatory Visit
Admission: RE | Admit: 2023-01-30 | Discharge: 2023-01-30 | Disposition: A | Discharge status: Home / Self Care | Payer: Medicare Other | Source: Ambulatory Visit | Attending: Radiation Oncology | Admitting: Radiation Oncology

## 2023-01-30 VITALS — Wt 148.4 lb

## 2023-01-30 DIAGNOSIS — Z483 Aftercare following surgery for neoplasm: Secondary | ICD-10-CM | POA: Insufficient documentation

## 2023-01-30 NOTE — Therapy (Signed)
OUTPATIENT PHYSICAL THERAPY [facility]ZO SCREENING NOTE   Patient Name: Nicole Nunez MRN: [id number] DOB:Sep 26, 1945, 77 y.o., female Today's Date: 01/30/2023  PCP: Dorothyann Peng, MD REFERRING PROVIDER: Harriette Bouillon, MD   PT End of Session - 01/30/23 [phone]     Visit Number 1   # unchanged due to screen only   PT Start Time 0851    PT Stop Time 0855    PT Time Calculation (min) 4 min    Activity Tolerance Patient tolerated treatment well    Behavior During Therapy [facility] for tasks assessed/performed             Past Medical History:  Diagnosis Date   Arthritis    Breast cancer (HCC) 05/18/2022   CKD stage 3 due to type 2 diabetes mellitus (HCC)    DM (diabetes mellitus) (HCC)    GERD (gastroesophageal reflux disease)    High cholesterol    HTN (hypertension)    Past Surgical History:  Procedure Laterality Date   BREAST BIOPSY Left 05/18/2022   [place] LT BREAST BX W LOC DEV 1ST LESION IMG BX SPEC US GUIDE 05/18/2022 [facility]   BREAST BIOPSY Left 06/20/2022   [place] LT RADIOACTIVE SEED LOC 06/20/2022 [facility]   BREAST LUMPECTOMY WITH RADIOACTIVE SEED AND AXILLARY LYMPH NODE DISSECTION Left 06/21/2022   Procedure: LEFT BREAST LUMPECTOMY WITH RADIOACTIVE SEED AND AXILLARY NODE DISSECTION;  Surgeon: Harriette Bouillon, MD;  Location: [facility];  Service: General;  Laterality: Left;   CATARACT EXTRACTION, BILATERAL  01/2018   first was 12/2017   corn removal Bilateral 06/2017   EYE SURGERY     PORT-A-CATH REMOVAL N/A 11/01/2022   Procedure: REMOVAL PORT-A-CATH;  Surgeon: Harriette Bouillon, MD;  Location: [facility];  Service: General;  Laterality: N/A;   PORTACATH PLACEMENT N/A 07/21/2022   Procedure: INSERTION PORT-A-CATH;  Surgeon: Harriette Bouillon, MD;  Location: [facility];  Service: General;  Laterality: N/A;  90 MIN ROOM 4-OK'D PER KELLY   SENTINEL NODE BIOPSY Left 07/21/2022   Procedure: LEFT SENTINEL NODE BIOPSY;  Surgeon: Harriette Bouillon,  MD;  Location: [facility];  Service: General;  Laterality: Left;   Patient Active Problem List   Diagnosis Date Noted   Port-A-Cath in place 08/16/2022   Malignant neoplasm of lower-outer quadrant of left breast of female, estrogen receptor positive (HCC) 05/31/2022   Statin myopathy 02/04/2021   Notalgia 01/29/2020   Vitamin D deficiency disease 01/29/2020   Class 1 obesity due to excess calories with serious comorbidity and body mass index (BMI) of 32.0 to 32.9 in adult 01/29/2020   Type 2 diabetes mellitus with stage 3 chronic kidney disease, without long-term current use of insulin (HCC) 05/24/2018   Chronic renal disease, stage III (HCC) 05/24/2018   Parenchymal renal hypertension 05/24/2018   Pure hypercholesterolemia 05/24/2018   Class 1 obesity due to excess calories with serious comorbidity and body mass index (BMI) of 31.0 to 31.9 in adult 05/24/2018    REFERRING DIAG: left breast cancer at risk for lymphedema  THERAPY DIAG:  Aftercare following surgery for neoplasm  PERTINENT HISTORY: Patient was diagnosed left IDC. It measures 9mm and is located in the upper outer quadrant. It is ER/PR positive with a Ki67 of 50%.  lumpectomy and SLNB on 06/21/22 but with no lymph nodes revealed in tissue. Will be having chemotherapy TC followed by radiation as well as sentinel lymph node mapping.   PRECAUTIONS: left UE Lymphedema risk, None  SUBJECTIVE: Pt returns for  her 3 month L-Dex screen.   PAIN:  Are you having pain? No  SOZO SCREENING: Patient was assessed today using the SOZO machine to determine the lymphedema index score. This was compared to her baseline score. It was determined that she is within the recommended range when compared to her baseline and no further action is needed at this time. She will continue SOZO screenings. These are done every 3 months for 2 years post operatively followed by every 6 months for 2 years, and then annually.   L-DEX FLOWSHEETS - 01/30/23 0800        L-DEX LYMPHEDEMA SCREENING   Measurement Type Unilateral    L-DEX MEASUREMENT EXTREMITY Upper Extremity    POSITION  Standing    DOMINANT SIDE Right    At Risk Side Left    BASELINE SCORE (UNILATERAL) 1.2    L-DEX SCORE (UNILATERAL) 6.6    VALUE CHANGE (UNILAT) 5.4               Hermenia Bers, PTA 01/30/2023, 8:53 AM

## 2023-01-30 NOTE — Progress Notes (Signed)
  Radiation Oncology         [phone] ________________________________  Name: Nicole Nunez MRN: [id number]  Date of Service: 01/30/2023  DOB: [birth year]  Post Treatment Telephone Note  Diagnosis:  Stage IB, pT1cN0M0, grade 3, triple negative invasive ductal carcinoma of the left breast. (as documented in provider EOT note)  The patient was not available for call today. Voicemail left.   The patient was encouraged to avoid sun exposure in the area of prior treatment for up to one year following radiation with either sunscreen or by the style of clothing worn in the sun.  The patient has scheduled follow up with her medical oncologist Dr. Pamelia Hoit for ongoing surveillance, and was encouraged to call if she develops concerns or questions regarding radiation.    Ruel Favors, LPN

## 2023-02-01 ENCOUNTER — Other Ambulatory Visit: Payer: Self-pay

## 2023-02-01 MED ORDER — ONETOUCH DELICA LANCING DEV MISC: 2 refills | Status: DC

## 2023-02-07 ENCOUNTER — Other Ambulatory Visit: Payer: Self-pay

## 2023-02-07 MED ORDER — ONETOUCH DELICA LANCING DEV MISC: 2 refills | Status: AC

## 2023-02-14 ENCOUNTER — Other Ambulatory Visit: Payer: Self-pay

## 2023-02-14 MED ORDER — ONETOUCH DELICA LANCETS 33G MISC: 2 refills | Status: DC

## 2023-02-27 ENCOUNTER — Other Ambulatory Visit: Payer: Self-pay | Admitting: Internal Medicine

## 2023-02-28 ENCOUNTER — Encounter: Payer: Self-pay | Admitting: Adult Health

## 2023-02-28 ENCOUNTER — Inpatient Hospital Stay: Payer: Medicare Other | Attending: Hematology and Oncology | Admitting: Adult Health

## 2023-02-28 VITALS — BP 126/72 | HR 78 | Temp 98.5°F | Resp 16 | Wt 151.4 lb

## 2023-02-28 DIAGNOSIS — C50512 Malignant neoplasm of lower-outer quadrant of left female breast: Secondary | ICD-10-CM | POA: Diagnosis not present

## 2023-02-28 DIAGNOSIS — Z1732 Human epidermal growth factor receptor 2 negative status: Secondary | ICD-10-CM | POA: Insufficient documentation

## 2023-02-28 DIAGNOSIS — Z170 Estrogen receptor positive status [ER+]: Secondary | ICD-10-CM | POA: Diagnosis not present

## 2023-02-28 DIAGNOSIS — Z1721 Progesterone receptor positive status: Secondary | ICD-10-CM | POA: Insufficient documentation

## 2023-02-28 DIAGNOSIS — Z923 Personal history of irradiation: Secondary | ICD-10-CM | POA: Diagnosis not present

## 2023-02-28 NOTE — Progress Notes (Signed)
SURVIVORSHIP VISIT:  BRIEF ONCOLOGIC HISTORY:  Oncology History  Malignant neoplasm of lower-outer quadrant of left breast of female, estrogen receptor positive (HCC)  05/18/2022 Initial Diagnosis   05/18/2022: Screening mammogram detected left breast mass by ultrasound it measured 9 mm, biopsy revealed grade 2 IDC ER 90%, PR 15%, Ki-67 50%, HER2 2+ by IHC, FISH negative ratio 2.17, Copy #3.9, additional 20 cells were examined ratio came at 2.19 and copy #3.72   06/05/2022 Cancer Staging   Staging form: Breast, AJCC 8th Edition - Clinical: Stage IA (cT1b, cN0, cM0, G2, ER+, PR+, HER2-) - Signed by Ronny Bacon, PA-C on 06/05/2022 Method of lymph node assessment: Clinical Histologic grading system: 3 grade system   06/21/2022 Surgery   Left lumpectomy: Grade 3 IDC 1.5 cm, margins negative, no lymph node (adipose tissue was in the sentinel node) ER 90% weak to moderate, PR 15% weak to strong, HER2 negative, Ki-67 50% Repeat prognostic panel: ER 0%, PR 0%, Ki-67 35%, HER2 2+ by IHC FISH negative ratio 1.72 (triple negative breast cancer)   08/09/2022 -  Chemotherapy   Patient is on Treatment Plan : BREAST TC q21d     10/10/2022 Cancer Staging   Staging form: Breast, AJCC 8th Edition - Pathologic stage from 10/10/2022: Stage IB (pT1c, pN0, cM0, G3, ER-, PR-, HER2-) - Signed by Ronny Bacon, PA-C on 10/10/2022 Histologic grading system: 3 grade system   10/27/2022 - 11/23/2022 Radiation Therapy   Plan Name: Breast_L_BH Site: Breast, Left Technique: 3D Mode: Photon Dose Per Fraction: 2.66 Gy Prescribed Dose (Delivered / Prescribed): 42.56 Gy / 42.56 Gy Prescribed Fxs (Delivered / Prescribed): 16 / 16   Plan Name: Brst_L_Bst_BH Site: Breast, Left Technique: 3D Mode: Photon Dose Per Fraction: 2 Gy Prescribed Dose (Delivered / Prescribed): 8 Gy / 8 Gy Prescribed Fxs (Delivered / Prescribed): 4 / 4     INTERVAL HISTORY:  Nicole Nunez to review her survivorship care plan  detailing her treatment course for breast cancer, as well as monitoring long-term side effects of that treatment, education regarding health maintenance, screening, and overall wellness and health promotion.     Overall, Nicole Nunez reports feeling quite well   REVIEW OF SYSTEMS:  Review of Systems  Constitutional:  Negative for appetite change, chills, fatigue, fever and unexpected weight change.  HENT:   Negative for hearing loss, lump/mass and trouble swallowing.   Eyes:  Negative for eye problems and icterus.  Respiratory:  Negative for chest tightness, cough and shortness of breath.   Cardiovascular:  Negative for chest pain, leg swelling and palpitations.  Gastrointestinal:  Negative for abdominal distention, abdominal pain, constipation, diarrhea, nausea and vomiting.  Endocrine: Negative for hot flashes.  Genitourinary:  Negative for difficulty urinating.   Musculoskeletal:  Negative for arthralgias.  Skin:  Negative for itching and rash.  Neurological:  Negative for dizziness, extremity weakness, headaches and numbness.  Hematological:  Negative for adenopathy. Does not bruise/bleed easily.  Psychiatric/Behavioral:  Negative for depression. The patient is not nervous/anxious.    Breast: Denies any new nodularity, masses, tenderness, nipple changes, or nipple discharge.       PAST MEDICAL/SURGICAL HISTORY:  Past Medical History:  Diagnosis Date   Arthritis    Breast cancer (HCC) 05/18/2022   CKD stage 3 due to type 2 diabetes mellitus (HCC)    DM (diabetes mellitus) (HCC)    GERD (gastroesophageal reflux disease)    High cholesterol    HTN (hypertension)    Past Surgical History:  Procedure Laterality Date   BREAST BIOPSY Left 05/18/2022   [place] LT BREAST BX W LOC DEV 1ST LESION IMG BX SPEC US GUIDE 05/18/2022 [facility]   BREAST BIOPSY Left 06/20/2022   [place] LT RADIOACTIVE SEED LOC 06/20/2022 [facility]   BREAST LUMPECTOMY WITH RADIOACTIVE SEED AND  AXILLARY LYMPH NODE DISSECTION Left 06/21/2022   Procedure: LEFT BREAST LUMPECTOMY WITH RADIOACTIVE SEED AND AXILLARY NODE DISSECTION;  Surgeon: Harriette Bouillon, MD;  Location: [facility];  Service: General;  Laterality: Left;   CATARACT EXTRACTION, BILATERAL  01/2018   first was 12/2017   corn removal Bilateral 06/2017   EYE SURGERY     PORT-A-CATH REMOVAL N/A 11/01/2022   Procedure: REMOVAL PORT-A-CATH;  Surgeon: Harriette Bouillon, MD;  Location: [facility];  Service: General;  Laterality: N/A;   PORTACATH PLACEMENT N/A 07/21/2022   Procedure: INSERTION PORT-A-CATH;  Surgeon: Harriette Bouillon, MD;  Location: [facility];  Service: General;  Laterality: N/A;  90 MIN ROOM 4-OK'D PER KELLY   SENTINEL NODE BIOPSY Left 07/21/2022   Procedure: LEFT SENTINEL NODE BIOPSY;  Surgeon: Harriette Bouillon, MD;  Location: [facility] OR;  Service: General;  Laterality: Left;     ALLERGIES:  Allergies  Allergen Reactions   Statins Other (See Comments)    Rosuvastatin, atorvastatin, pravastatin, simvastatin muscle aching      CURRENT MEDICATIONS:  Outpatient Encounter Medications as of 02/28/2023  Medication Sig   Ascorbic Acid (VITAMIN C) 1000 MG tablet Take 1,000 mg by mouth daily.   aspirin EC 81 MG tablet Take 81 mg by mouth daily.   b complex vitamins tablet Take 1 tablet by mouth daily.   Calcium Carbonate (CALCIUM 600 PO) Take 600 mg by mouth daily.   Cholecalciferol (VITAMIN D3) 125 MCG (5000 UT) CAPS Take 5,000 Units by mouth daily.   dapagliflozin propanediol (FARXIGA) 10 MG TABS tablet Take 1 tablet (10 mg total) by mouth daily.   hydrOXYzine (ATARAX) 10 MG tablet TAKE 1-2 TABLETS BY MOUTH EVERY EVENING AS NEEDED   Lancet Devices (ONE TOUCH DELICA LANCING DEV) MISC USE AS DIRECTED TO CHECK BLOOD SUGARS ONCE DAILY dx: e11.22   loratadine (CLARITIN) 10 MG tablet TAKE 1 TABLET BY MOUTH EVERY DAY   magnesium gluconate (MAGONATE) 500 MG tablet Take 500 mg by mouth daily.   Multiple  Vitamin (MULTIVITAMIN) tablet Take 1 tablet by mouth daily.   olmesartan (BENICAR) 20 MG tablet Take 1 tablet (20 mg total) by mouth daily.   OneTouch Delica Lancets 33G MISC Use daily with one touch meter   ONETOUCH VERIO test strip USE TO CHECK BLOOD SUGAR DAILY   pantoprazole (PROTONIX) 40 MG tablet Take 40 mg by mouth daily.   Pitavastatin Magnesium 4 MG TABS Take 1 tablet (4 mg total) by mouth daily.   Semaglutide,0.25 or 0.5MG /DOS, (OZEMPIC, 0.25 OR 0.5 MG/DOSE,) 2 MG/3ML SOPN Inject 0.5mg  SQ weekly.   zinc gluconate 50 MG tablet Take 50 mg by mouth daily.   [DISCONTINUED] traMADol (ULTRAM) 50 MG tablet Take 1 tablet (50 mg total) by mouth every 6 (six) hours as needed. (Patient not taking: Reported on 02/28/2023)   No facility-administered encounter medications on file as of 02/28/2023.     ONCOLOGIC FAMILY HISTORY:  Family History  Problem Relation Age of Onset   Asthma Mother    Hypertension Mother    Alzheimer's disease Mother    Early death Father      SOCIAL HISTORY:  Social History   Socioeconomic History  Marital status: Widowed    Spouse name: Not on file   Number of children: Not on file   Years of education: Not on file   Highest education level: GED or equivalent  Occupational History   Not on file  Tobacco Use   Smoking status: Never   Smokeless tobacco: Never  Vaping Use   Vaping status: Never Used  Substance and Sexual Activity   Alcohol use: Never   Drug use: Never   Sexual activity: Yes  Other Topics Concern   Not on file  Social History Narrative   Not on file   Social Determinants of Health   Financial Resource Strain: Low Risk  (02/10/2022)   Overall Financial Resource Strain (CARDIA)    Difficulty of Paying Living Expenses: Not hard at all  Food Insecurity: No Food Insecurity (10/23/2022)   Hunger Vital Sign    Worried About Running Out of Food in the Last Year: Never true    Ran Out of Food in the Last Year: Never true  Transportation  Needs: No Transportation Needs (10/23/2022)   PRAPARE - Administrator, Civil Service (Medical): No    Lack of Transportation (Non-Medical): No  Physical Activity: Inactive (02/10/2022)   Exercise Vital Sign    Days of Exercise per Week: 0 days    Minutes of Exercise per Session: 0 min  Stress: No Stress Concern Present (02/10/2022)   [employer] of Occupational Health - Occupational Stress Questionnaire    Feeling of Stress : Not at all  Social Connections: Unknown (10/23/2022)   Social Connection and Isolation Panel [NHANES]    Frequency of Communication with Friends and Family: More than three times a week    Frequency of Social Gatherings with Friends and Family: Once a week    Attends Religious Services: More than 4 times per year    Active Member of [employer] or Organizations: Not on file    Attends Banker Meetings: Not on file    Marital Status: Widowed  Intimate Partner Violence: Not At Risk (10/11/2022)   Humiliation, Afraid, Rape, and Kick questionnaire    Fear of Current or Ex-Partner: No    Emotionally Abused: No    Physically Abused: No    Sexually Abused: No     OBSERVATIONS/OBJECTIVE:  BP 126/72 (BP Location: Left Arm, Patient Position: Sitting)   Pulse 78   Temp 98.5 F (36.9 C) (Oral)   Resp 16   Wt 151 lb 6.4 oz (68.7 kg)   SpO2 98%   BMI 31.64 kg/m  GENERAL: Patient is a well appearing female in no acute distress HEENT:  Sclerae anicteric.  Oropharynx clear and moist. No ulcerations or evidence of oropharyngeal candidiasis. Neck is supple.  NODES:  No cervical, supraclavicular, or axillary lymphadenopathy palpated.  BREAST EXAM: Left breast status postlumpectomy and radiation no sign of local recurrence right breast is benign. Small, 6mm darkened seborrheic keratosis in left lower outer quadrant (patient to let us know if it gets bigger) LUNGS:  Clear to auscultation bilaterally.  No wheezes or rhonchi. HEART:  Regular rate and rhythm.  No murmur appreciated. ABDOMEN:  Soft, nontender.  Positive, normoactive bowel sounds. No organomegaly palpated. MSK:  No focal spinal tenderness to palpation. Full range of motion bilaterally in the upper extremities. EXTREMITIES:  No peripheral edema.   SKIN:  Clear with no obvious rashes or skin changes. No nail dyscrasia. NEURO:  Nonfocal. Well oriented.  Appropriate affect.   LABORATORY DATA:  None for this visit.  DIAGNOSTIC IMAGING:  None for this visit.      ASSESSMENT AND PLAN:  Ms.. Nunez is a pleasant 77 y.o. female with Stage IB left breast invasive ductal carcinoma, ER-/PR-/HER2-, diagnosed in December 2023, treated with lumpectomy, adjuvant chemotherapy, and adjuvant radiation therapy.  She presents to the Survivorship Clinic for our initial meeting and routine follow-up post-completion of treatment for breast cancer.    1. Stage 1B left breast cancer:  Nicole Nunez is continuing to recover from definitive treatment for breast cancer. She will follow-up with her medical oncologist, Dr.  Pamelia Hoit in 6 months with history and physical exam per surveillance protocol.  Her mammogram is due 05/2023; orders placed today.   Today, a comprehensive survivorship care plan and treatment summary was reviewed with the patient today detailing her breast cancer diagnosis, treatment course, potential late/long-term effects of treatment, appropriate follow-up care with recommendations for the future, and patient education resources.  A copy of this summary, along with a letter will be sent to the patient's primary care provider via mail/fax/In Basket message after today's visit.    2. Bone health:   She was given education on specific activities to promote bone health.  3. Cancer screening:  Due to Nicole Nunez's history and her age, she should receive screening for skin cancers, colon cancer.  The information and recommendations are listed on the patient's comprehensive care plan/treatment summary  and were reviewed in detail with the patient.    4. Health maintenance and wellness promotion: Nicole Nunez was encouraged to consume 5-7 servings of fruits and vegetables per day. We reviewed the "Nutrition Rainbow" handout.  She was also encouraged to engage in moderate to vigorous exercise for 30 minutes per day most days of the week.  She was instructed to limit her alcohol consumption and continue to abstain from tobacco use.     5. Support services/counseling: It is not uncommon for this period of the patient's cancer care trajectory to be one of many emotions and stressors.   She was given information regarding our available services and encouraged to contact me with any questions or for help enrolling in any of our support group/programs.    Follow up instructions:    -Return to cancer center in 6 months for f/u with Dr. Pamelia Hoit  -Mammogram due in 05/2023 -She is welcome to return back to the Survivorship Clinic at any time; no additional follow-up needed at this time.  -Consider referral back to survivorship as a long-term survivor for continued surveillance  The patient was provided an opportunity to ask questions and all were answered. The patient agreed with the plan and demonstrated an understanding of the instructions.   Total encounter time:45 minutes*in face-to-face visit time, chart review, lab review, care coordination, order entry, and documentation of the encounter time.    Lillard Anes, NP 02/28/23 10:16 AM Medical Oncology and Hematology [facility] [address] [place], [place] [address] Tel. [phone]    Fax. [phone]  *Total Encounter Time as defined by the Centers for Medicare and Medicaid Services includes, in addition to the face-to-face time of a patient visit (documented in the note above) non-face-to-face time: obtaining and reviewing outside history, ordering and reviewing medications, tests or procedures, care coordination  (communications with other health care professionals or caregivers) and documentation in the medical record.

## 2023-03-01 ENCOUNTER — Telehealth: Payer: Self-pay | Admitting: Adult Health

## 2023-03-01 NOTE — Telephone Encounter (Signed)
Patient is aware of scheduled appointment times/dates for follow visit per [facility] 10/8 LOS

## 2023-03-02 ENCOUNTER — Ambulatory Visit: Payer: Self-pay | Admitting: Internal Medicine

## 2023-03-02 ENCOUNTER — Ambulatory Visit: Payer: Medicare Other

## 2023-03-02 DIAGNOSIS — Z0000 Encounter for general adult medical examination without abnormal findings: Secondary | ICD-10-CM

## 2023-03-02 NOTE — Patient Instructions (Signed)
Nicole Nunez , Thank you for taking time to come for your Medicare Wellness Visit. I appreciate your ongoing commitment to your health goals. Please review the following plan we discussed and let me know if I can assist you in the future.   Referrals/Orders/Follow-Ups/Clinician Recommendations: none  This is a list of the screening recommended for you and due dates:  Health Maintenance  Topic Date Due   Flu Shot  12/22/2022   COVID-19 Vaccine (4 - 2023-24 season) 01/22/2023   Yearly kidney health urinalysis for diabetes  02/18/2023   Complete foot exam   02/18/2023   Hemoglobin A1C  04/25/2023   Eye exam for diabetics  10/06/2023   Yearly kidney function blood test for diabetes  10/25/2023   Medicare Annual Wellness Visit  03/01/2024   Colon Cancer Screening  12/17/2025   DTaP/Tdap/Td vaccine (3 - Td or Tdap) 09/22/2031   Pneumonia Vaccine  Completed   DEXA scan (bone density measurement)  Completed   Hepatitis C Screening  Completed   Zoster (Shingles) Vaccine  Completed   HPV Vaccine  Aged Out    Advanced directives: (ACP Link)Information on Advanced Care Planning can be found at [facility] Advance Health Care Directives Advance Health Care Directives ([email or web address]   Next Medicare Annual Wellness Visit scheduled for next year: No, office will schedule  Insert Preventive Care attachment Insert FALL PREVENTION attachment if needed

## 2023-03-02 NOTE — Progress Notes (Signed)
Subjective:   Nicole Nunez is a 77 y.o. female who presents for Medicare Annual (Subsequent) preventive examination.  Visit Complete: Virtual I connected with  [name] on 03/02/23 by a audio enabled telemedicine application and verified that I am speaking with the correct person using two identifiers.  Patient Location: Home  Provider Location: Office/Clinic  I discussed the limitations of evaluation and management by telemedicine. The patient expressed understanding and agreed to proceed.  Patient Medicare AWV questionnaire was completed by patient on 02/26/2023; I have confirmed that all information answered by patient is correct and no changes since this date.  Vital Signs: Because this visit was a virtual/telehealth visit, some criteria may be missing or patient reported. Any vitals not documented were not able to be obtained and vitals that have been documented are patient reported.    Cardiac Risk Factors include: advanced age (>63men, >8 women);diabetes mellitus     Objective:    Today's Vitals   There is no height or weight on file to calculate BMI.     03/02/2023    8:33 AM 02/28/2023   10:16 AM 11/28/2022    3:23 PM 11/01/2022    8:26 AM 10/11/2022    9:19 AM 09/20/2022    9:47 AM 08/30/2022    9:24 AM  Advanced Directives  Does Patient Have a Medical Advance Directive? No No No No No No No  Would patient like information on creating a medical advance directive?   Yes (ED - Information included in AVS) No - Patient declined Yes (ED - Information included in AVS) No - Patient declined No - Patient declined    Current Medications (verified) Outpatient Encounter Medications as of 03/02/2023  Medication Sig   Ascorbic Acid (VITAMIN C) 1000 MG tablet Take 1,000 mg by mouth daily.   aspirin EC 81 MG tablet Take 81 mg by mouth daily.   b complex vitamins tablet Take 1 tablet by mouth daily.   Calcium Carbonate (CALCIUM 600 PO) Take 600 mg by mouth daily.    Cholecalciferol (VITAMIN D3) 125 MCG (5000 UT) CAPS Take 5,000 Units by mouth daily.   dapagliflozin propanediol (FARXIGA) 10 MG TABS tablet Take 1 tablet (10 mg total) by mouth daily.   hydrOXYzine (ATARAX) 10 MG tablet TAKE 1-2 TABLETS BY MOUTH EVERY EVENING AS NEEDED   Lancet Devices (ONE TOUCH DELICA LANCING DEV) MISC USE AS DIRECTED TO CHECK BLOOD SUGARS ONCE DAILY dx: e11.22   loratadine (CLARITIN) 10 MG tablet TAKE 1 TABLET BY MOUTH EVERY DAY   magnesium gluconate (MAGONATE) 500 MG tablet Take 500 mg by mouth daily.   Multiple Vitamin (MULTIVITAMIN) tablet Take 1 tablet by mouth daily.   olmesartan (BENICAR) 20 MG tablet TAKE 1 TABLET BY MOUTH DAILY   OneTouch Delica Lancets 33G MISC Use daily with one touch meter   ONETOUCH VERIO test strip USE TO CHECK BLOOD SUGAR DAILY   Pitavastatin Magnesium 4 MG TABS Take 1 tablet (4 mg total) by mouth daily.   Semaglutide,0.25 or 0.5MG /DOS, (OZEMPIC, 0.25 OR 0.5 MG/DOSE,) 2 MG/3ML SOPN Inject 0.5mg  SQ weekly.   zinc gluconate 50 MG tablet Take 50 mg by mouth daily.   pantoprazole (PROTONIX) 40 MG tablet Take 40 mg by mouth daily. (Patient not taking: Reported on 03/02/2023)   No facility-administered encounter medications on file as of 03/02/2023.    Allergies (verified) Statins   History: Past Medical History:  Diagnosis Date   Arthritis    Breast cancer (HCC)  05/18/2022   CKD stage 3 due to type 2 diabetes mellitus (HCC)    DM (diabetes mellitus) (HCC)    GERD (gastroesophageal reflux disease)    High cholesterol    HTN (hypertension)    Past Surgical History:  Procedure Laterality Date   BREAST BIOPSY Left 05/18/2022   [place] LT BREAST BX W LOC DEV 1ST LESION IMG BX SPEC US GUIDE 05/18/2022 [facility]   BREAST BIOPSY Left 06/20/2022   [place] LT RADIOACTIVE SEED LOC 06/20/2022 [facility]   BREAST LUMPECTOMY WITH RADIOACTIVE SEED AND AXILLARY LYMPH NODE DISSECTION Left 06/21/2022   Procedure: LEFT BREAST LUMPECTOMY WITH  RADIOACTIVE SEED AND AXILLARY NODE DISSECTION;  Surgeon: Harriette Bouillon, MD;  Location: [facility];  Service: General;  Laterality: Left;   CATARACT EXTRACTION, BILATERAL  01/2018   first was 12/2017   corn removal Bilateral 06/2017   EYE SURGERY     PORT-A-CATH REMOVAL N/A 11/01/2022   Procedure: REMOVAL PORT-A-CATH;  Surgeon: Harriette Bouillon, MD;  Location:  SURGERY CENTER;  Service: General;  Laterality: N/A;   PORTACATH PLACEMENT N/A 07/21/2022   Procedure: INSERTION PORT-A-CATH;  Surgeon: Harriette Bouillon, MD;  Location: [facility];  Service: General;  Laterality: N/A;  90 MIN ROOM 4-OK'D PER KELLY   SENTINEL NODE BIOPSY Left 07/21/2022   Procedure: LEFT SENTINEL NODE BIOPSY;  Surgeon: Harriette Bouillon, MD;  Location: [facility];  Service: General;  Laterality: Left;   Family History  Problem Relation Age of Onset   Asthma Mother    Hypertension Mother    Alzheimer's disease Mother    Early death Father    Asthma Sister    Diabetes Sister    Heart disease Sister    Hypertension Sister    Social History   Socioeconomic History   Marital status: Widowed    Spouse name: Not on file   Number of children: Not on file   Years of education: Not on file   Highest education level: GED or equivalent  Occupational History   Not on file  Tobacco Use   Smoking status: Never   Smokeless tobacco: Never  Vaping Use   Vaping status: Never Used  Substance and Sexual Activity   Alcohol use: Never   Drug use: Never   Sexual activity: Yes  Other Topics Concern   Not on file  Social History Narrative   Not on file   Social Determinants of Health   Financial Resource Strain: Low Risk  (02/26/2023)   Overall Financial Resource Strain (CARDIA)    Difficulty of Paying Living Expenses: Not hard at all  Food Insecurity: No Food Insecurity (02/26/2023)   Hunger Vital Sign    Worried About Running Out of Food in the Last Year: Never true    Ran Out of Food in the Last Year:  Never true  Transportation Needs: No Transportation Needs (02/26/2023)   PRAPARE - Administrator, Civil Service (Medical): No    Lack of Transportation (Non-Medical): No  Physical Activity: Sufficiently Active (02/26/2023)   Exercise Vital Sign    Days of Exercise per Week: 3 days    Minutes of Exercise per Session: 120 min  Stress: No Stress Concern Present (02/26/2023)   [employer] of Occupational Health - Occupational Stress Questionnaire    Feeling of Stress : Not at all  Social Connections: Moderately Integrated (02/26/2023)   Social Connection and Isolation Panel [NHANES]    Frequency of Communication with Friends and Family: More  than three times a week    Frequency of Social Gatherings with Friends and Family: More than three times a week    Attends Religious Services: More than 4 times per year    Active Member of Clubs or Organizations: Yes    Attends Banker Meetings: More than 4 times per year    Marital Status: Widowed    Tobacco Counseling Counseling given: Not Answered   Clinical Intake:  Pre-visit preparation completed: Yes  Pain : No/denies pain     Nutritional Risks: None Diabetes: Yes CBG done?: No Did pt. bring in CBG monitor from home?: No  How often do you need to have someone help you when you read instructions, pamphlets, or other written materials from your doctor or pharmacy?: 1 - Never  Interpreter Needed?: No  Information entered by :: NAllen LPN   Activities of Daily Living    02/26/2023    8:11 PM 11/01/2022    8:30 AM  In your present state of health, do you have any difficulty performing the following activities:  Hearing? 0 0  Vision? 0 0  Difficulty concentrating or making decisions? 0 0  Walking or climbing stairs? 0 0  Dressing or bathing? 0 0  Doing errands, shopping? 0   Preparing Food and eating ? N   Using the Toilet? N   In the past six months, have you accidently leaked urine? N   Do you  have problems with loss of bowel control? N   Managing your Medications? N   Managing your Finances? N   Housekeeping or managing your Housekeeping? N     Patient Care Team: Dorothyann Peng, MD as PCP - General (Internal Medicine) Serena Croissant, MD as Consulting Physician (Hematology and Oncology) Harriette Bouillon, MD as Consulting Physician (General Surgery) Dorothy Puffer, MD as Consulting Physician (Radiation Oncology)  Indicate any recent Medical Services you may have received from other than [facility] providers in the past year (date may be approximate).     Assessment:   This is a routine wellness examination for Nicole Nunez.  Hearing/Vision screen Hearing Screening - Comments:: Denies hearing issues Vision Screening - Comments:: Regular eye exams, Dr. Gwendalyn Ege   Goals Addressed             This Visit's Progress    Patient Stated       03/02/2023, stay healthy       Depression Screen    03/02/2023    8:34 AM 02/10/2022    9:11 AM 12/31/2020    8:46 AM 01/29/2020   11:43 AM 12/20/2018   10:54 AM 08/27/2018    9:55 AM 05/24/2018   10:58 AM  PHQ 2/9 Scores  PHQ - 2 Score 0 0 0 0 0 0 0  PHQ- 9 Score 0   0 0      Fall Risk    02/26/2023    8:11 PM 06/20/2022    8:24 AM 02/10/2022    9:11 AM 12/31/2020    8:46 AM 01/29/2020   11:42 AM  Fall Risk   Falls in the past year? 0 0 0 0 0  Number falls in past yr: 0 0 0    Injury with Fall? 1 0 0    Risk for fall due to : Medication side effect No Fall Risks Medication side effect Medication side effect Medication side effect  Follow up Falls prevention discussed;Falls evaluation completed Falls evaluation completed Falls prevention discussed;Falls evaluation completed;Education provided Falls evaluation completed;Education  provided;Falls prevention discussed Falls evaluation completed;Education provided;Falls prevention discussed    MEDICARE RISK AT HOME: Medicare Risk at Home Any stairs in or around the home?: No Home free of loose  throw rugs in walkways, pet beds, electrical cords, etc?: Yes Adequate lighting in your home to reduce risk of falls?: Yes Life alert?: No Use of a cane, walker or w/c?: No Grab bars in the bathroom?: No Shower chair or bench in shower?: No Elevated toilet seat or a handicapped toilet?: No  TIMED UP AND GO:  Was the test performed?  No    Cognitive Function:        03/02/2023    8:35 AM 02/10/2022    9:12 AM 12/31/2020    8:47 AM 01/29/2020   11:45 AM 12/20/2018   10:57 AM  6CIT Screen  What Year? 0 points 0 points 0 points 0 points 0 points  What month? 0 points 0 points 0 points 0 points 0 points  What time? 0 points 0 points 0 points 0 points 0 points  Count back from 20 0 points 0 points 0 points 0 points 0 points  Months in reverse 0 points 0 points 0 points 0 points 0 points  Repeat phrase 0 points 0 points 0 points 0 points 0 points  Total Score 0 points 0 points 0 points 0 points 0 points    Immunizations Immunization History  Administered Date(s) Administered   DTaP 06/17/2011   Fluad Quad(high Dose 65+) 03/19/2019, 01/29/2020, 02/04/2021, 02/17/2022   Influenza-Unspecified 02/06/2018   PFIZER(Purple Top)SARS-COV-2 Vaccination 07/08/2019, 07/30/2019, 05/10/2020   Pneumococcal Conjugate-13 05/07/2014   Pneumococcal Polysaccharide-23 02/28/2013   Pneumococcal-Unspecified 02/25/2013, 05/07/2015   Tdap 09/21/2021   Zoster Recombinant(Shingrix) 06/23/2021, 08/24/2021   Zoster, Live 04/30/2013    TDAP status: Up to date  Flu Vaccine status: Due, Education has been provided regarding the importance of this vaccine. Advised may receive this vaccine at local pharmacy or Health Dept. Aware to provide a copy of the vaccination record if obtained from local pharmacy or Health Dept. Verbalized acceptance and understanding.  Pneumococcal vaccine status: Up to date  Covid-19 vaccine status: Information provided on how to obtain vaccines.   Qualifies for Shingles Vaccine?  Yes   Zostavax completed Yes   Shingrix Completed?: Yes  Screening Tests Health Maintenance  Topic Date Due   INFLUENZA VACCINE  12/22/2022   COVID-19 Vaccine (4 - 2023-24 season) 01/22/2023   Diabetic kidney evaluation - Urine ACR  02/18/2023   FOOT EXAM  02/18/2023   HEMOGLOBIN A1C  04/25/2023   OPHTHALMOLOGY EXAM  10/06/2023   Diabetic kidney evaluation - eGFR measurement  10/25/2023   Medicare Annual Wellness (AWV)  03/01/2024   Colonoscopy  12/17/2025   DTaP/Tdap/Td (3 - Td or Tdap) 09/22/2031   Pneumonia Vaccine 33+ Years old  Completed   DEXA SCAN  Completed   Hepatitis C Screening  Completed   Zoster Vaccines- Shingrix  Completed   HPV VACCINES  Aged Out    Health Maintenance  Health Maintenance Due  Topic Date Due   INFLUENZA VACCINE  12/22/2022   COVID-19 Vaccine (4 - 2023-24 season) 01/22/2023   Diabetic kidney evaluation - Urine ACR  02/18/2023   FOOT EXAM  02/18/2023    Colorectal cancer screening: Type of screening: Colonoscopy. Completed 12/18/2015. Repeat every 10 years  Mammogram status: Completed 05/04/2022. Repeat every year  Bone Density status: Completed 04/18/2018.   Lung Cancer Screening: (Low Dose CT Chest recommended if Age 73-80 years,  20 pack-year currently smoking OR have quit w/in 15years.) does not qualify.   Lung Cancer Screening Referral: no  Additional Screening:  Hepatitis C Screening: does qualify; Completed 06/19/2012  Vision Screening: Recommended annual ophthalmology exams for early detection of glaucoma and other disorders of the eye. Is the patient up to date with their annual eye exam?  Yes  Who is the provider or what is the name of the office in which the patient attends annual eye exams? Dr. Gwendalyn Ege If pt is not established with a provider, would they like to be referred to a provider to establish care? No .   Dental Screening: Recommended annual dental exams for proper oral hygiene  Diabetic Foot Exam: Diabetic Foot  Exam: Overdue, Pt has been advised about the importance in completing this exam. Pt is scheduled for diabetic foot exam on next appointment.  Community Resource Referral / Chronic Care Management: CRR required this visit?  No   CCM required this visit?  No     Plan:     I have personally reviewed and noted the following in the patient's chart:   Medical and social history Use of alcohol, tobacco or illicit drugs  Current medications and supplements including opioid prescriptions. Patient is not currently taking opioid prescriptions. Functional ability and status Nutritional status Physical activity Advanced directives List of other physicians Hospitalizations, surgeries, and ER visits in previous 12 months Vitals Screenings to include cognitive, depression, and falls Referrals and appointments  In addition, I have reviewed and discussed with patient certain preventive protocols, quality metrics, and best practice recommendations. A written personalized care plan for preventive services as well as general preventive health recommendations were provided to patient.     Barb Merino, LPN   [id number]   After Visit Summary: (MyChart) Due to this being a telephonic visit, the after visit summary with patients personalized plan was offered to patient via MyChart   Nurse Notes: none

## 2023-03-08 ENCOUNTER — Encounter: Payer: Medicare Other | Admitting: Internal Medicine

## 2023-03-15 ENCOUNTER — Encounter: Payer: Self-pay | Admitting: Internal Medicine

## 2023-03-15 ENCOUNTER — Ambulatory Visit: Payer: Medicare Other | Admitting: Internal Medicine

## 2023-03-15 VITALS — BP 122/80 | HR 85 | Temp 98.0°F | Ht <= 58 in | Wt 148.0 lb

## 2023-03-15 DIAGNOSIS — Z0000 Encounter for general adult medical examination without abnormal findings: Secondary | ICD-10-CM

## 2023-03-15 DIAGNOSIS — Z6830 Body mass index (BMI) 30.0-30.9, adult: Secondary | ICD-10-CM

## 2023-03-15 DIAGNOSIS — E7800 Pure hypercholesterolemia, unspecified: Secondary | ICD-10-CM

## 2023-03-15 DIAGNOSIS — E1122 Type 2 diabetes mellitus with diabetic chronic kidney disease: Secondary | ICD-10-CM | POA: Diagnosis not present

## 2023-03-15 DIAGNOSIS — Z23 Encounter for immunization: Secondary | ICD-10-CM

## 2023-03-15 DIAGNOSIS — C50512 Malignant neoplasm of lower-outer quadrant of left female breast: Secondary | ICD-10-CM | POA: Diagnosis not present

## 2023-03-15 DIAGNOSIS — N1831 Chronic kidney disease, stage 3a: Secondary | ICD-10-CM

## 2023-03-15 DIAGNOSIS — E113293 Type 2 diabetes mellitus with mild nonproliferative diabetic retinopathy without macular edema, bilateral: Secondary | ICD-10-CM

## 2023-03-15 DIAGNOSIS — L309 Dermatitis, unspecified: Secondary | ICD-10-CM

## 2023-03-15 DIAGNOSIS — Z170 Estrogen receptor positive status [ER+]: Secondary | ICD-10-CM | POA: Diagnosis not present

## 2023-03-15 DIAGNOSIS — E6609 Other obesity due to excess calories: Secondary | ICD-10-CM

## 2023-03-15 DIAGNOSIS — I129 Hypertensive chronic kidney disease with stage 1 through stage 4 chronic kidney disease, or unspecified chronic kidney disease: Secondary | ICD-10-CM

## 2023-03-15 DIAGNOSIS — E66811 Obesity, class 1: Secondary | ICD-10-CM

## 2023-03-15 LAB — POCT URINALYSIS DIPSTICK
Bilirubin, UA: NEGATIVE
Blood, UA: NEGATIVE
Glucose, UA: POSITIVE — AB
Ketones, UA: NEGATIVE
Nitrite, UA: NEGATIVE
Protein, UA: NEGATIVE
Spec Grav, UA: <=1.005 — AB (ref 1.010–1.025)
Urobilinogen, UA: 0.2 U/dL
pH, UA: 5 (ref 5.0–8.0)

## 2023-03-15 NOTE — Patient Instructions (Signed)

## 2023-03-15 NOTE — Progress Notes (Signed)
I,[name], CMA,acting as a Neurosurgeon for [name], MD.,have documented all relevant documentation on the behalf of [name], MD,as directed by  [name], MD while in the presence of [name], MD.  Subjective:    Patient ID: Nicole Nunez , female    DOB: 10-30-1945 , 77 y.o.   MRN: [id number]  Chief Complaint  Patient presents with   Annual Exam   Diabetes   Hypertension   Hyperlipidemia    HPI  Patient here for annual exam. Patient stated she no longer goes to a GYN. She reports compliance with meds.   She denies headaches, chest pain and shortness of breath. She has no specific concerns or complaints at this time.   Diabetes She presents for her follow-up diabetic visit. She has type 2 diabetes mellitus. Her disease course has been stable. There are no hypoglycemic associated symptoms. Pertinent negatives for diabetes include no blurred vision. There are no hypoglycemic complications. She is following a diabetic diet. She has not had a previous visit with a dietitian. She participates in exercise three times a week. An ACE inhibitor/angiotensin II receptor blocker is being taken. Eye exam is current.  Hypertension This is a chronic problem. The current episode started more than 1 year ago. The problem has been gradually improving since onset. The problem is controlled. Pertinent negatives include no blurred vision. The current treatment provides moderate improvement. Hypertensive end-organ damage includes kidney disease.     Past Medical History:  Diagnosis Date   Arthritis    Breast cancer (HCC) 05/18/2022   CKD stage 3 due to type 2 diabetes mellitus (HCC)    DM (diabetes mellitus) (HCC)    GERD (gastroesophageal reflux disease)    High cholesterol    HTN (hypertension)      Family History  Problem Relation Age of Onset   Asthma Mother    Hypertension Mother    Alzheimer's disease Mother    Early death Father    Asthma Sister     Diabetes Sister    Heart disease Sister    Hypertension Sister      Current Outpatient Medications:    Ascorbic Acid (VITAMIN C) 1000 MG tablet, Take 1,000 mg by mouth daily., Disp: , Rfl:    aspirin EC 81 MG tablet, Take 81 mg by mouth daily., Disp: , Rfl:    b complex vitamins tablet, Take 1 tablet by mouth daily., Disp: , Rfl:    Calcium Carbonate (CALCIUM 600 PO), Take 600 mg by mouth daily., Disp: , Rfl:    Cholecalciferol (VITAMIN D3) 125 MCG (5000 UT) CAPS, Take 5,000 Units by mouth daily., Disp: , Rfl:    dapagliflozin propanediol (FARXIGA) 10 MG TABS tablet, Take 1 tablet (10 mg total) by mouth daily., Disp: 90 tablet, Rfl: 2   hydrOXYzine (ATARAX) 10 MG tablet, TAKE 1-2 TABLETS BY MOUTH EVERY EVENING AS NEEDED, Disp: 180 tablet, Rfl: 1   Lancet Devices (ONE TOUCH DELICA LANCING DEV) MISC, USE AS DIRECTED TO CHECK BLOOD SUGARS ONCE DAILY dx: e11.22, Disp: 1 each, Rfl: 2   loratadine (CLARITIN) 10 MG tablet, TAKE 1 TABLET BY MOUTH EVERY DAY, Disp: 90 tablet, Rfl: 1   magnesium gluconate (MAGONATE) 500 MG tablet, Take 500 mg by mouth daily., Disp: , Rfl:    Multiple Vitamin (MULTIVITAMIN) tablet, Take 1 tablet by mouth daily., Disp: , Rfl:    olmesartan (BENICAR) 20 MG tablet, TAKE 1 TABLET BY MOUTH DAILY, Disp:  100 tablet, Rfl: 2   OneTouch Delica Lancets 33G MISC, Use daily with one touch meter, Disp: 100 each, Rfl: 2   Pitavastatin Magnesium 4 MG TABS, Take 1 tablet (4 mg total) by mouth daily., Disp: 30 tablet, Rfl: 11   Semaglutide,0.25 or 0.5MG /DOS, (OZEMPIC, 0.25 OR 0.5 MG/DOSE,) 2 MG/3ML SOPN, Inject 0.5mg  SQ weekly., Disp: 3 mL, Rfl: 0   zinc gluconate 50 MG tablet, Take 50 mg by mouth daily., Disp: , Rfl:    ONETOUCH VERIO test strip, USE TO CHECK BLOOD SUGAR DAILY, Disp: 100 strip, Rfl: 2   pantoprazole (PROTONIX) 40 MG tablet, Take 40 mg by mouth daily. (Patient not taking: Reported on 03/02/2023), Disp: , Rfl:    Allergies  Allergen Reactions   Statins Other (See  Comments)    Rosuvastatin, atorvastatin, pravastatin, simvastatin muscle aching       The patient states she uses post menopausal status for birth control. No LMP recorded. Patient is postmenopausal.. Negative for Dysmenorrhea. Negative for: breast discharge, breast lump(s), breast pain and breast self exam. Associated symptoms include abnormal vaginal bleeding. Pertinent negatives include abnormal bleeding (hematology), anxiety, decreased libido, depression, difficulty falling sleep, dyspareunia, history of infertility, nocturia, sexual dysfunction, sleep disturbances, urinary incontinence, urinary urgency, vaginal discharge and vaginal itching. Diet regular.The patient states her exercise level is  intremittent.  . The patient's tobacco use is:  Social History   Tobacco Use  Smoking Status Never  Smokeless Tobacco Never  . She has been exposed to passive smoke. The patient's alcohol use is:  Social History   Substance and Sexual Activity  Alcohol Use Never    Review of Systems  Constitutional: Negative.   HENT: Negative.    Eyes: Negative.  Negative for blurred vision.  Respiratory: Negative.    Cardiovascular: Negative.   Gastrointestinal: Negative.   Endocrine: Negative.   Genitourinary: Negative.   Skin: Negative.   Allergic/Immunologic: Negative.   Neurological: Negative.   Hematological: Negative.   Psychiatric/Behavioral: Negative.       Today's Vitals   03/15/23 1504  BP: 122/80  Pulse: 85  Temp: 98 F (36.7 C)  SpO2: 98%  Weight: 148 lb (67.1 kg)  Height: 4\' 10"  (1.473 m)   Body mass index is 30.93 kg/m.  Wt Readings from Last 3 Encounters:  03/15/23 148 lb (67.1 kg)  02/28/23 151 lb 6.4 oz (68.7 kg)  01/30/23 148 lb 6 oz (67.3 kg)     Objective:  Physical Exam Vitals and nursing note reviewed.  Constitutional:      Appearance: Normal appearance.  HENT:     Head: Normocephalic and atraumatic.     Right Ear: Tympanic membrane, ear canal and  external ear normal.     Left Ear: Tympanic membrane, ear canal and external ear normal.     Nose: Nose normal.     Mouth/Throat:     Mouth: Mucous membranes are moist.     Pharynx: Oropharynx is clear.  Eyes:     Extraocular Movements: Extraocular movements intact.     Conjunctiva/sclera: Conjunctivae normal.     Pupils: Pupils are equal, round, and reactive to light.  Cardiovascular:     Rate and Rhythm: Normal rate and regular rhythm.     Pulses: Normal pulses.          Dorsalis pedis pulses are 2+ on the right side and 2+ on the left side.     Heart sounds: Normal heart sounds.  Pulmonary:     Effort:  Pulmonary effort is normal.     Breath sounds: Normal breath sounds.  Chest:  Breasts:    Tanner Score is 5.     Right: Normal.     Comments: Skin thickening left breast, healed surgical scar left breast/left axilla  Healed surgical scar  R upr anterior chest Abdominal:     General: Abdomen is flat. Bowel sounds are normal.     Palpations: Abdomen is soft.  Genitourinary:    Comments: deferred Musculoskeletal:        General: Normal range of motion.     Cervical back: Normal range of motion and neck supple.  Feet:     Right foot:     Protective Sensation: 5 sites tested.  5 sites sensed.     Skin integrity: Dry skin present.     Toenail Condition: Right toenails are normal.     Left foot:     Protective Sensation: 5 sites tested.  5 sites sensed.     Skin integrity: Dry skin present.     Toenail Condition: Left toenails are normal.  Skin:    General: Skin is warm and dry.  Neurological:     General: No focal deficit present.     Mental Status: She is alert and oriented to person, place, and time.  Psychiatric:        Mood and Affect: Mood normal.        Behavior: Behavior normal.         Assessment And Plan:     Encounter for general adult medical examination w/o abnormal findings Assessment & Plan: A full exam was performed.  Importance of monthly self  breast exams was discussed with the patient.  She is advised to get 30-45 minutes of regular exercise, no less than four to five days per week. Both weight-bearing and aerobic exercises are recommended.  She is advised to follow a healthy diet with at least six fruits/veggies per day, decrease intake of red meat and other saturated fats and to increase fish intake to twice weekly.  Meats/fish should not be fried -- baked, boiled or broiled is preferable. It is also important to cut back on your sugar intake.  Be sure to read labels - try to avoid anything with added sugar, high fructose corn syrup or other sweeteners.  If you must use a sweetener, you can try stevia or monkfruit.  It is also important to avoid artificially sweetened foods/beverages and diet drinks. Lastly, wear SPF 50 sunscreen on exposed skin and when in direct sunlight for an extended period of time.  Be sure to avoid fast food restaurants and aim for at least 60 ounces of water daily.       Type 2 diabetes mellitus with stage 3a chronic kidney disease, without long-term current use of insulin (HCC) Assessment & Plan: Chronic, diabetic foot exam was performed.  She will continue with [place] 10mg  daily and Ozempic 0.5mg  weekly.  She will f/u in four months for re-evaluation.  I DISCUSSED WITH THE PATIENT AT LENGTH REGARDING THE GOALS OF GLYCEMIC CONTROL AND POSSIBLE LONG-TERM COMPLICATIONS.  I  ALSO STRESSED THE IMPORTANCE OF COMPLIANCE WITH HOME GLUCOSE MONITORING, DIETARY RESTRICTIONS INCLUDING AVOIDANCE OF SUGARY DRINKS/PROCESSED FOODS,  ALONG WITH REGULAR EXERCISE.  I  ALSO STRESSED THE IMPORTANCE OF ANNUAL EYE EXAMS, SELF FOOT CARE AND COMPLIANCE WITH OFFICE VISITS.   Orders: -     POCT urinalysis dipstick -     Microalbumin / creatinine urine ratio -  EKG 12-Lead -     CBC -     CMP14+EGFR -     Hemoglobin A1c -     PTH, intact and calcium -     Phosphorus -     Protein electrophoresis, serum -     AMB Referral VBCI  Care Management  Mild nonproliferative diabetic retinopathy of both eyes associated with type 2 diabetes mellitus, macular edema presence unspecified (HCC) Assessment & Plan: Chronic, she is encouraged to keep A1c less than 7 and to keep up with regular eye exams.    Parenchymal renal hypertension, stage 1 through stage 4 or unspecified chronic kidney disease Assessment & Plan: Chronic, controlled. Optimal BP<120/80.  EKG performed, NSR w/ . She will continue with olmesartan 20mg  daily. Encouraged to follow low sodium diet. She will f/u in four to six months for re-evaluation.   Orders: -     POCT urinalysis dipstick -     Microalbumin / creatinine urine ratio -     EKG 12-Lead -     AMB Referral VBCI Care Management  Pure hypercholesterolemia Assessment & Plan: Chronic, LDL goal is less than 70.  She will continue with pitavastatin 4mg  daily. Encouraged to follow a heart healthy lifestyle.   Orders: -     AMB Referral VBCI Care Management  Class 1 obesity due to excess calories with serious comorbidity and body mass index (BMI) of 30.0 to 30.9 in adult Assessment & Plan: She is encouraged to aim for at least 150 minutes of exercise per week.    Immunization due -     Flu Vaccine Trivalent High Dose (Fluad) -     Pfizer Comirnaty Covid-19 Vaccine 32yrs & older  She is encouraged to strive for BMI less than 30 to decrease cardiac risk. Advised to aim for at least 150 minutes of exercise per week.    Return for 1 year physical, 4 month DM. Patient was given opportunity to ask questions. Patient verbalized understanding of the plan and was able to repeat key elements of the plan. All questions were answered to their satisfaction.     I, [name], MD, have reviewed all documentation for this visit. The documentation on 03/15/23 for the exam, diagnosis, procedures, and orders are all accurate and complete.

## 2023-03-17 LAB — PROTEIN ELECTROPHORESIS, SERUM
A/G Ratio: 1.2 (ref 0.7–1.7)
Albumin ELP: 3.6 g/dL (ref 2.9–4.4)
Alpha 1: 0.2 g/dL (ref 0.0–0.4)
Alpha 2: 0.7 g/dL (ref 0.4–1.0)
Beta: 0.9 g/dL (ref 0.7–1.3)
Gamma Globulin: 1.1 g/dL (ref 0.4–1.8)
Globulin, Total: 3 g/dL (ref 2.2–3.9)

## 2023-03-17 LAB — CMP14+EGFR
ALT: 20 [IU]/L (ref 0–32)
AST: 28 [IU]/L (ref 0–40)
Albumin: 4.2 g/dL (ref 3.8–4.8)
Alkaline Phosphatase: 112 [IU]/L (ref 44–121)
BUN/Creatinine Ratio: 18 (ref 12–28)
BUN: 20 mg/dL (ref 8–27)
Bilirubin Total: 0.9 mg/dL (ref 0.0–1.2)
CO2: 23 mmol/L (ref 20–29)
Calcium: 9.3 mg/dL (ref 8.7–10.3)
Chloride: 100 mmol/L (ref 96–106)
Creatinine, Ser: 1.11 mg/dL — ABNORMAL HIGH (ref 0.57–1.00)
Globulin, Total: 2.4 g/dL (ref 1.5–4.5)
Glucose: 88 mg/dL (ref 70–99)
Potassium: 4 mmol/L (ref 3.5–5.2)
Sodium: 139 mmol/L (ref 134–144)
Total Protein: 6.6 g/dL (ref 6.0–8.5)
eGFR: 52 mL/min/{1.73_m2} — ABNORMAL LOW (ref >59)

## 2023-03-17 LAB — PTH, INTACT AND CALCIUM: PTH: 26 pg/mL (ref 15–65)

## 2023-03-17 LAB — CBC
Hematocrit: 36 % (ref 34.0–46.6)
Hemoglobin: 11.5 g/dL (ref 11.1–15.9)
MCH: 27.9 pg (ref 26.6–33.0)
MCHC: 31.9 g/dL (ref 31.5–35.7)
MCV: 87 fL (ref 79–97)
Platelets: 251 10*3/uL (ref 150–450)
RBC: 4.12 x10E6/uL (ref 3.77–5.28)
RDW: 15.1 % (ref 11.7–15.4)
WBC: 4.6 10*3/uL (ref 3.4–10.8)

## 2023-03-17 LAB — PHOSPHORUS: Phosphorus: 3.3 mg/dL (ref 3.0–4.3)

## 2023-03-17 LAB — HEMOGLOBIN A1C
Est. average glucose Bld gHb Est-mCnc: 163 mg/dL
Hgb A1c MFr Bld: 7.3 % — ABNORMAL HIGH (ref 4.8–5.6)

## 2023-03-17 LAB — MICROALBUMIN / CREATININE URINE RATIO
Creatinine, Urine: 17.3 mg/dL
Microalb/Creat Ratio: <17 mg/g{creat} (ref 0–29)
Microalbumin, Urine: <3 ug/mL

## 2023-03-18 ENCOUNTER — Other Ambulatory Visit: Payer: Self-pay | Admitting: Internal Medicine

## 2023-03-22 DIAGNOSIS — Z0000 Encounter for general adult medical examination without abnormal findings: Secondary | ICD-10-CM | POA: Insufficient documentation

## 2023-03-22 DIAGNOSIS — E113293 Type 2 diabetes mellitus with mild nonproliferative diabetic retinopathy without macular edema, bilateral: Secondary | ICD-10-CM | POA: Insufficient documentation

## 2023-03-22 LAB — SIGNATERA
SIGNATERA MTM READOUT: 0.02 MTM/ml — AB
SIGNATERA TEST RESULT: POSITIVE — AB

## 2023-03-22 NOTE — Assessment & Plan Note (Signed)
She is encouraged to aim for at least 150 minutes of exercise per week.

## 2023-03-22 NOTE — Assessment & Plan Note (Signed)

## 2023-03-22 NOTE — Assessment & Plan Note (Signed)
Chronic, controlled. Optimal BP<120/80.  EKG performed, NSR w/ . She will continue with olmesartan 20mg  daily. Encouraged to follow low sodium diet. She will f/u in four to six months for re-evaluation.

## 2023-03-22 NOTE — Assessment & Plan Note (Signed)
Chronic, diabetic foot exam was performed.  She will continue with Farxiga 10mg  daily and Ozempic 0.5mg  weekly.  She will f/u in four months for re-evaluation.  I DISCUSSED WITH THE PATIENT AT LENGTH REGARDING THE GOALS OF GLYCEMIC CONTROL AND POSSIBLE LONG-TERM COMPLICATIONS.  I  ALSO STRESSED THE IMPORTANCE OF COMPLIANCE WITH HOME GLUCOSE MONITORING, DIETARY RESTRICTIONS INCLUDING AVOIDANCE OF SUGARY DRINKS/PROCESSED FOODS,  ALONG WITH REGULAR EXERCISE.  I  ALSO STRESSED THE IMPORTANCE OF ANNUAL EYE EXAMS, SELF FOOT CARE AND COMPLIANCE WITH OFFICE VISITS.

## 2023-03-22 NOTE — Assessment & Plan Note (Signed)
Chronic, LDL goal is less than 70.  She will continue with pitavastatin 4mg  daily. Encouraged to follow a heart healthy lifestyle.

## 2023-03-22 NOTE — Assessment & Plan Note (Signed)
Chronic, she is encouraged to keep A1c less than 7 and to keep up with regular eye exams.

## 2023-03-23 ENCOUNTER — Telehealth: Payer: Self-pay

## 2023-03-23 NOTE — Progress Notes (Signed)
Care Guide Note  03/23/2023 Name: Nicole Nunez MRN: [id number] DOB: 14-Mar-1946  Referred by: Dorothyann Peng, MD Reason for referral : Care Coordination (Outreach to schedule with Pharm d )   Nicole Nunez is a 76 y.o. year old female who is a primary care patient of Dorothyann Peng, MD. Nicole Nunez was referred to the pharmacist for assistance related to DM.    Successful contact was made with the patient to discuss pharmacy services including being ready for the pharmacist to call at least 5 minutes before the scheduled appointment time, to have medication bottles and any blood sugar or blood pressure readings ready for review. The patient agreed to meet with the pharmacist via with the pharmacist via telephone visit on (date/time).  04/17/2023  Penne Lash, RMA Care Guide [facility]  [place], [place] [address] Direct Dial: [phone] [name]

## 2023-03-28 ENCOUNTER — Telehealth: Payer: Self-pay | Admitting: Hematology and Oncology

## 2023-03-28 ENCOUNTER — Other Ambulatory Visit: Payer: Self-pay | Admitting: *Deleted

## 2023-03-28 DIAGNOSIS — C50512 Malignant neoplasm of lower-outer quadrant of left female breast: Secondary | ICD-10-CM

## 2023-03-28 NOTE — Progress Notes (Signed)
Pt Signatera testing results came back positive on 03/15/23.  Verbal orders received from MD to obtain CT CAP and Bone Scan for further evaluation.  Orders place and pt notified and verbalized understanding.

## 2023-03-28 NOTE — Telephone Encounter (Signed)
Patient is aware of scheduled appointmetn times/dates

## 2023-03-31 IMAGING — MG MM DIGITAL SCREENING BILAT W/ TOMO AND CAD
8 series · 8 of 24 positions shown · non-contrast
Comparison: Previous exam(s).

CLINICAL DATA: Screening.

EXAM:
DIGITAL SCREENING BILATERAL MAMMOGRAM WITH TOMOSYNTHESIS AND CAD
TECHNIQUE: Bilateral screening digital craniocaudal and mediolateral oblique
mammograms were obtained. Bilateral screening digital breast
tomosynthesis was performed. The images were evaluated with
computer-aided detection.

[L CC synth-2D]
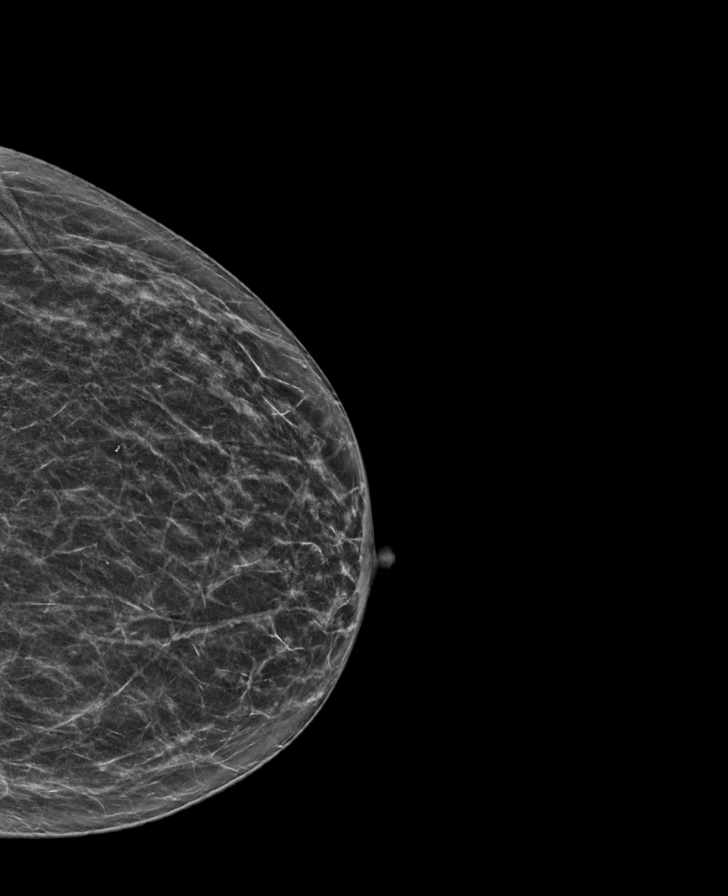

[R CC synth-2D]
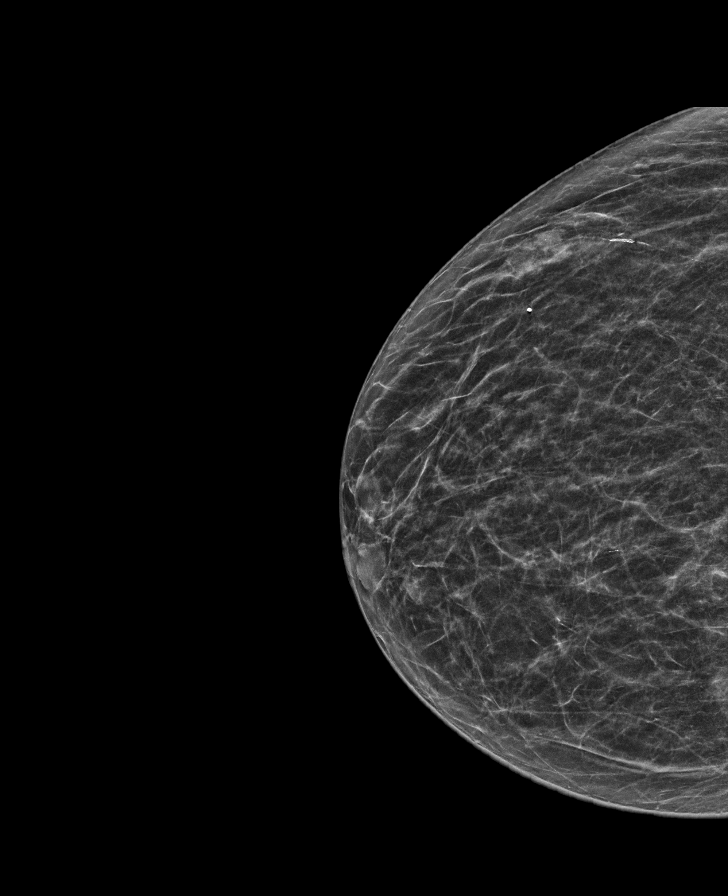

[L MLO synth-2D]
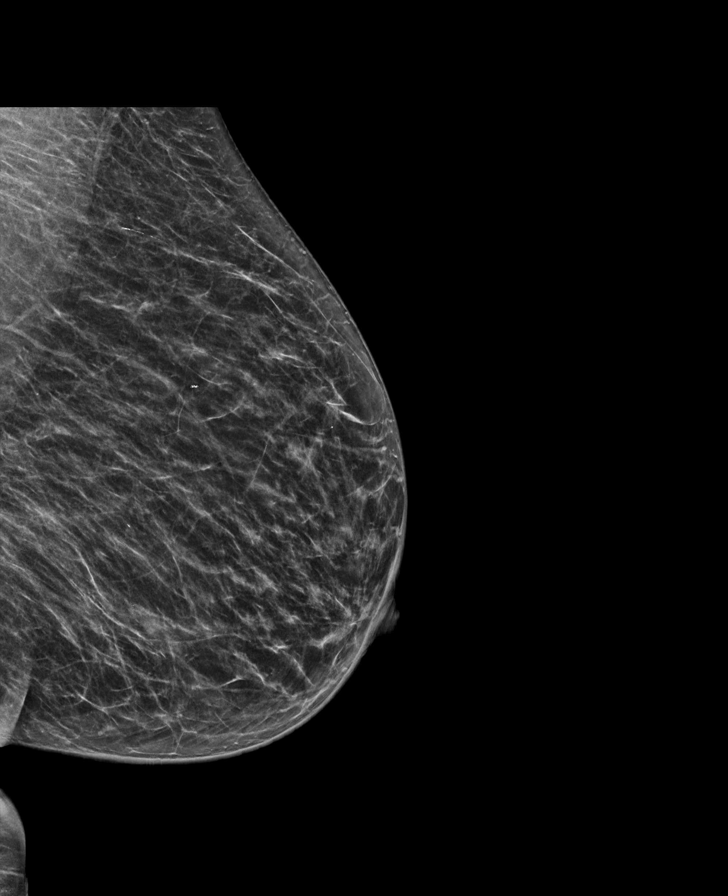

[R MLO synth-2D]
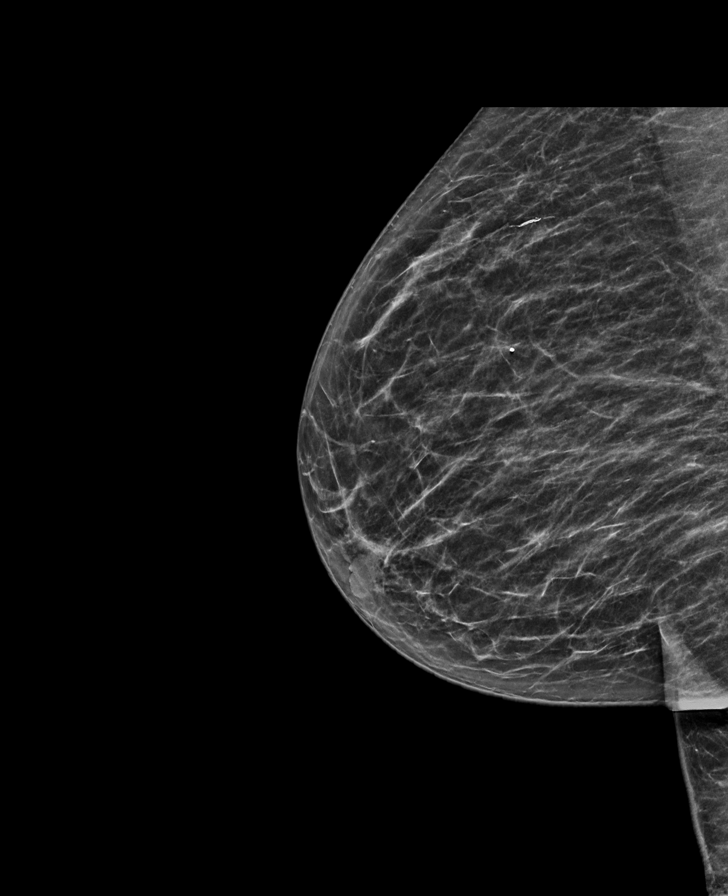

[L MLO tomo · tomo slice 31/60.0]
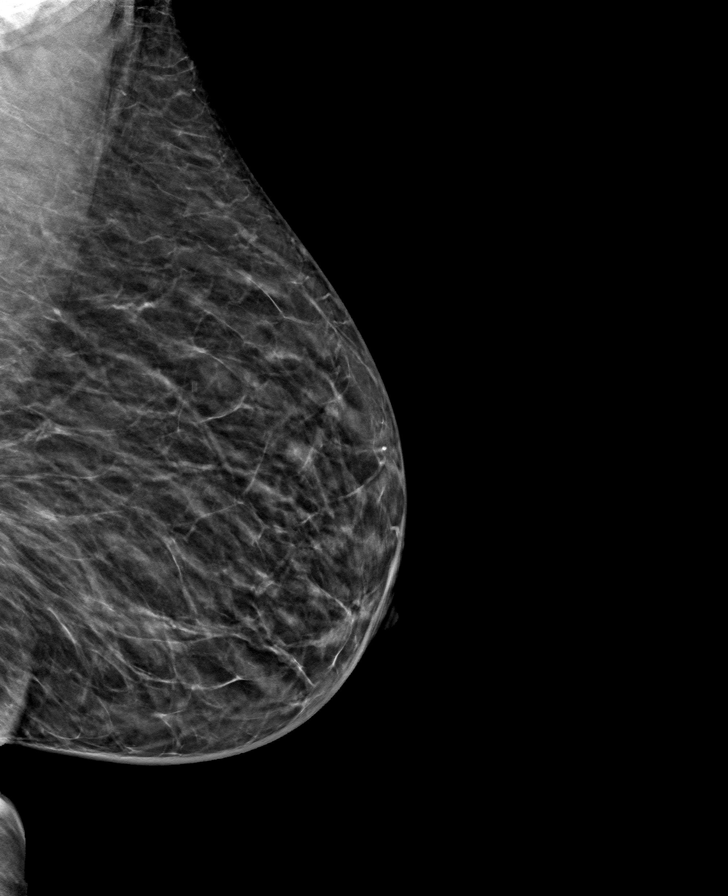

[L CC tomo · tomo slice 29/57.0]
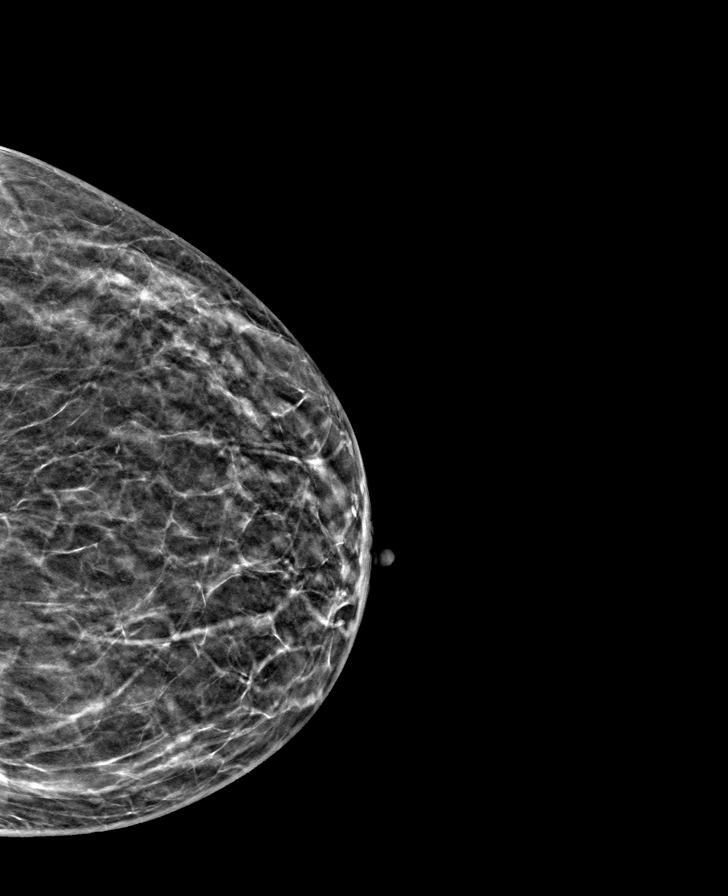

[R CC tomo · tomo slice 28/55.0]
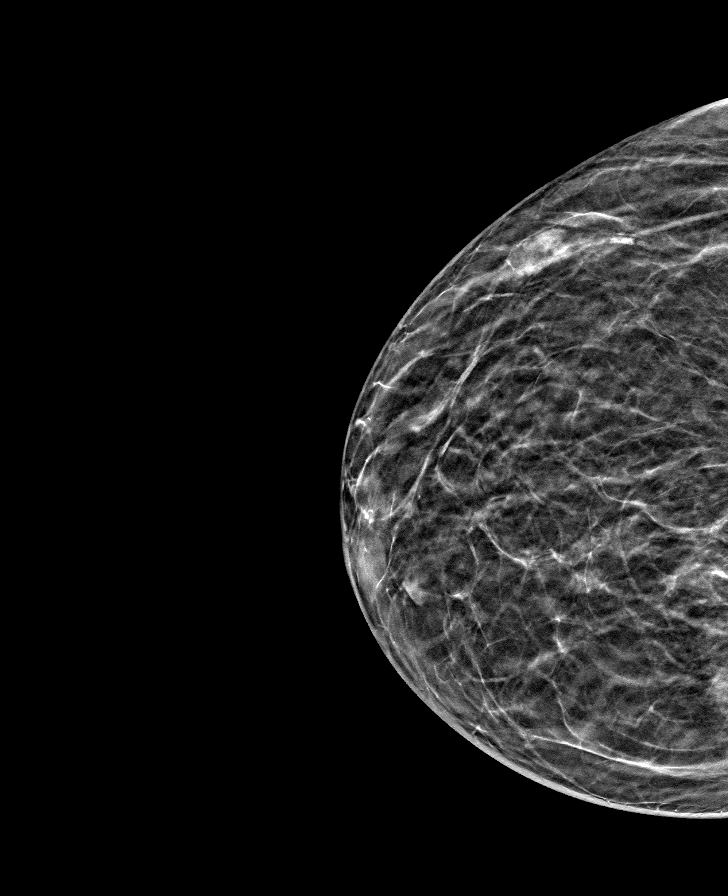

[R MLO tomo · tomo slice 31/60.0]
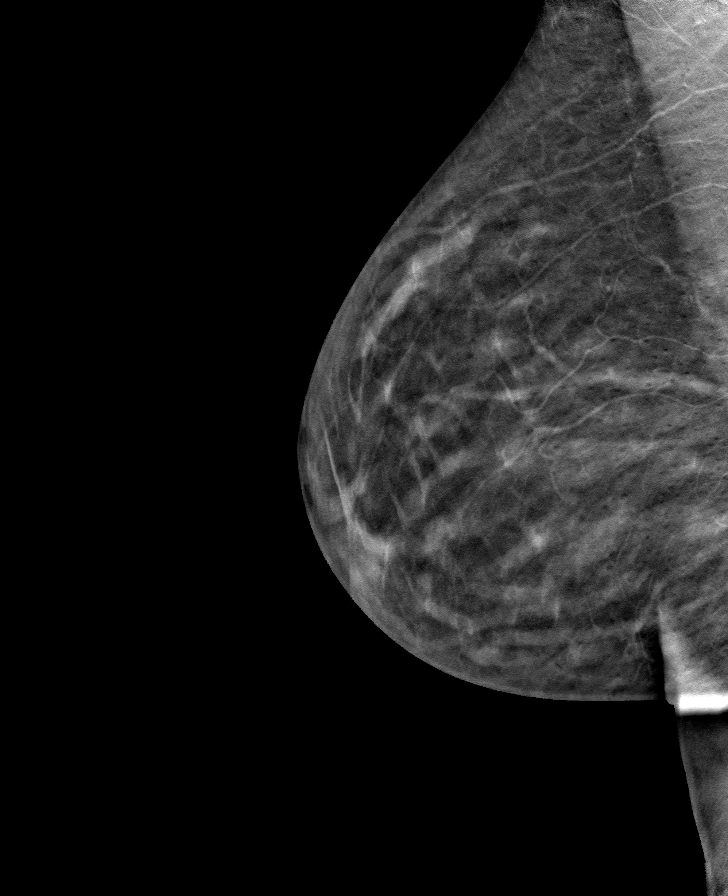

[8 of 24 positions shown; findings below may reference images not displayed]

ACR Breast Density Category b: There are scattered areas of
fibroglandular density.
FINDINGS: There are no findings suspicious for malignancy.
IMPRESSION: No mammographic evidence of malignancy. A result letter of this
screening mammogram will be mailed directly to the patient.

RECOMMENDATION:
Screening mammogram in one year. (Code:51-O-LD2)

BI-RADS CATEGORY  1: Negative.

## 2023-04-12 ENCOUNTER — Encounter (HOSPITAL_COMMUNITY)
Admission: RE | Admit: 2023-04-12 | Discharge: 2023-04-12 | Disposition: A | Discharge status: Home / Self Care | Payer: Medicare Other | Source: Ambulatory Visit | Attending: Hematology and Oncology | Admitting: Hematology and Oncology

## 2023-04-12 ENCOUNTER — Encounter (HOSPITAL_COMMUNITY): Payer: Self-pay

## 2023-04-12 ENCOUNTER — Ambulatory Visit (HOSPITAL_COMMUNITY)
Admission: RE | Admit: 2023-04-12 | Discharge: 2023-04-12 | Disposition: A | Discharge status: Home / Self Care | Payer: Medicare Other | Source: Ambulatory Visit | Attending: Hematology and Oncology | Admitting: Hematology and Oncology

## 2023-04-12 DIAGNOSIS — C50912 Malignant neoplasm of unspecified site of left female breast: Secondary | ICD-10-CM | POA: Diagnosis not present

## 2023-04-12 DIAGNOSIS — Z170 Estrogen receptor positive status [ER+]: Secondary | ICD-10-CM | POA: Insufficient documentation

## 2023-04-12 DIAGNOSIS — C50512 Malignant neoplasm of lower-outer quadrant of left female breast: Secondary | ICD-10-CM | POA: Insufficient documentation

## 2023-04-12 DIAGNOSIS — C50919 Malignant neoplasm of unspecified site of unspecified female breast: Secondary | ICD-10-CM | POA: Diagnosis not present

## 2023-04-12 DIAGNOSIS — N289 Disorder of kidney and ureter, unspecified: Secondary | ICD-10-CM | POA: Diagnosis not present

## 2023-04-12 MED ORDER — IOHEXOL 300 MG/ML  SOLN
100.0000 mL | Freq: Once | INTRAMUSCULAR | Status: AC | PRN
  Administered 2023-04-12: 80 mL via INTRAVENOUS

## 2023-04-12 MED ORDER — TECHNETIUM TC 99M MEDRONATE IV KIT
21.5000 | PACK | Freq: Once | INTRAVENOUS | Status: AC
  Administered 2023-04-12: 21.5 via INTRAVENOUS

## 2023-04-17 ENCOUNTER — Telehealth: Payer: Self-pay | Admitting: Pharmacist

## 2023-04-17 ENCOUNTER — Other Ambulatory Visit: Payer: Self-pay | Admitting: Pharmacist

## 2023-04-17 ENCOUNTER — Other Ambulatory Visit: Payer: Medicare Other | Admitting: Pharmacist

## 2023-04-17 DIAGNOSIS — E7800 Pure hypercholesterolemia, unspecified: Secondary | ICD-10-CM

## 2023-04-17 DIAGNOSIS — T466X5A Adverse effect of antihyperlipidemic and antiarteriosclerotic drugs, initial encounter: Secondary | ICD-10-CM

## 2023-04-17 DIAGNOSIS — E1122 Type 2 diabetes mellitus with diabetic chronic kidney disease: Secondary | ICD-10-CM

## 2023-04-17 DIAGNOSIS — N1831 Chronic kidney disease, stage 3a: Secondary | ICD-10-CM

## 2023-04-17 NOTE — Patient Instructions (Signed)
Asyia,   It was great talking to you today!  We have submitted applications for Ozempic for 2024 and 2025. Hopefully, they will be able to process the 2024 application and send [place] an order for you before the end of the year.   Please reach out with any questions!  Catie Eppie Gibson, PharmD, BCACP, CPP Clinical Pharmacist [facility] [phone]

## 2023-04-17 NOTE — Telephone Encounter (Signed)
Completed re-enrollment for patient assistance for Ozempic. Prepared sample of Ozempic 0.5 mg weekly for patient to pick up.   Medication Samples have been provided to the patient.  Drug name: Ozempic       Strength: 0.5 mg        Qty: 1 pen  LOT: [id number]  Exp.Date: 07/20/2024  Dosing instructions: Inject 0.5 mg weekly   The patient has been instructed regarding the correct time, dose, and frequency of taking this medication, including desired effects and most common side effects.   Catie Debera Sterba 1:54 PM 04/17/2023

## 2023-04-17 NOTE — Progress Notes (Signed)
Pharmacy Medication Assistance Program Note    04/17/2023  Patient ID: Nicole Nunez, female   DOB: [birth year], 77 y.o.   MRN: [id number]     04/17/2023  Outreach Medication One  Initial Outreach Date (Medication One) 04/17/2023  Manufacturer Medication One [employer] [facility] Ozempic  Dose of Ozempic 0.5 mg  Type of Radiographer, therapeutic Assistance  Date Application Sent to Patient 04/17/2023  Application Items Requested Application  Date Application Sent to Prescriber 04/17/2023  Name of Prescriber Dorothyann Peng  Date Application Received From Patient 04/17/2023  Application Items Received From Patient Application  Date Application Received From Provider 04/17/2023  Date Application Submitted to Manufacturer 04/17/2023  Method Application Sent to Manufacturer Online    Completed applications for 2024 and 2025 for Ozempic   Catie Eppie Gibson, PharmD, BCACP, CPP Clinical Pharmacist [facility] [phone]

## 2023-04-17 NOTE — Progress Notes (Signed)
04/17/2023 Name: Nicole Nunez MRN: [id number] DOB: 1945/07/24  Chief Complaint  Patient presents with   Medication Management   Diabetes    Nicole Nunez is a 77 y.o. year old female who presented for a telephone visit.   They were referred to the pharmacist by their PCP for assistance in managing medication access.    Subjective:  Care Team: Primary Care Provider: Dorothyann Peng, MD ; Next Scheduled Visit: 07/25/23  Medication Access/Adherence  Current Pharmacy:  [facility]) - [place], [place] - [address] [place] [address] [place] [address] Phone: [phone] Fax: [phone]  [id number] - [address] [place] - [address] [place] [address] Phone: [phone] Fax: [phone]  [facility] - [place], [place] - [address] [place] [address] Phone: [phone] Fax: [phone]  [facility] - [place], [place] - [address] [place] [address] [place] [address] Phone: [phone] Fax: [phone]   Patient reports affordability concerns with their medications: No  Patient reports access/transportation concerns to their pharmacy: No  Patient reports adherence concerns with their medications:  No     Diabetes:  Current medications: Ozempic 0.5 mg weekly, Farxiga 10 mg daily  Current glucose readings: FBG 121 mg/dL (ranging from 07-371 mg/dL - 062 mg/dL reading after eating a piece of cake before going to bed) Using glucose meter; testing daily  Current medication access support: Farxiga through [place] Patient Assistance; currently using samples of Ozempic   Hypertension:  Current medications: olmesartan 20 mg daily   Hyperlipidemia/ASCVD Risk Reduction  Current lipid lowering medications: pitavastatin 4 mg daily  Antiplatelet regimen: aspirin 81 mg daily    Objective:  Lab Results   Component Value Date   HGBA1C 7.3 (H) 03/15/2023    Lab Results  Component Value Date   CREATININE 1.11 (H) 03/15/2023   BUN 20 03/15/2023   NA 139 03/15/2023   K 4.0 03/15/2023   CL 100 03/15/2023   CO2 23 03/15/2023    Lab Results  Component Value Date   CHOL 185 06/02/2020   HDL 64 06/02/2020   LDLCALC 108 (H) 06/02/2020   TRIG 68 06/02/2020   CHOLHDL 2.9 06/02/2020    Medications Reviewed Today     Reviewed by Alden Hipp, RPH-CPP (Pharmacist) on 04/17/23 at 1518  Med List Status: <None>   Medication Order Taking? Sig Documenting Provider Last Dose Status Informant  Ascorbic Acid (VITAMIN C) 1000 MG tablet [id number] Yes Take 1,000 mg by mouth daily. [provider] Taking Active Self  aspirin EC 81 MG tablet [id number] Yes Take 81 mg by mouth daily. [provider] Taking Active Self  b complex vitamins tablet [id number] Yes Take 1 tablet by mouth daily. [provider] Taking Active Self  Calcium Carbonate (CALCIUM 600 PO) [id number] Yes Take 600 mg by mouth daily. [provider] Taking Active Self  Cholecalciferol (VITAMIN D3) 125 MCG (5000 UT) CAPS [id number] Yes Take 5,000 Units by mouth daily. [provider] Taking Active Self  dapagliflozin propanediol (FARXIGA) 10 MG TABS tablet [id number] Yes Take 1 tablet (10 mg total) by mouth daily. Dorothyann Peng, MD Taking Active   hydrOXYzine (ATARAX) 10 MG tablet [id number] No TAKE 1-2 TABLETS BY MOUTH EVERY EVENING AS NEEDED  Patient not taking: Reported on 04/17/2023   Dorothyann Peng, MD Not Taking Active   Lancet  Devices (ONE TOUCH DELICA LANCING DEV) MISC [id number]  USE AS DIRECTED TO CHECK BLOOD SUGARS ONCE DAILY dx: e11.22 Dorothyann Peng, MD  Active   loratadine (CLARITIN) 10 MG tablet [id number] Yes TAKE 1 TABLET BY MOUTH EVERY DAY Dorothyann Peng, MD Taking Active   magnesium gluconate (MAGONATE) 500 MG tablet [id number] Yes Take 500 mg by mouth daily. [provider] Taking Active Self  Multiple Vitamin (MULTIVITAMIN) tablet [id number] Yes Take 1 tablet by mouth daily. [provider] Taking Active Self  olmesartan (BENICAR) 20 MG tablet [id number] Yes TAKE 1 TABLET BY MOUTH DAILY Dorothyann Peng, MD Taking Active   OneTouch Delica Lancets 33G [place] [id number]  Use daily with one touch meter Dorothyann Peng, MD  Active   [facility] VERIO test strip [id number]  USE TO CHECK BLOOD SUGAR DAILY Dorothyann Peng, MD  Active   pantoprazole (PROTONIX) 40 MG tablet [id number] Yes Take 40 mg by mouth daily. [provider] Taking Active   Pitavastatin Magnesium 4 MG TABS [id number] Yes Take 1 tablet (4 mg total) by mouth daily. Chrystie Nose, MD Taking Active   Semaglutide,0.25 or 0.5MG /DOS, (OZEMPIC, 0.25 OR 0.5 MG/DOSE,) 2 MG/3ML SOPN [id number] Yes Inject 0.5mg  SQ weekly. Dorothyann Peng, MD Taking Active Self  zinc gluconate 50 MG tablet [id number] Yes Take 50 mg by mouth daily. [provider] Taking Active Self              Assessment/Plan:   Diabetes: - Currently uncontrolled based on most recent A1c of 7.3%, although reported FBG at goal < 130 mg/dL  - Reviewed long term cardiovascular and renal outcomes of uncontrolled blood sugar - Reviewed goal A1c, goal fasting, and goal 2 hour post prandial glucose - Recommend to continue Ozempic 0.5 mg weekly. Discussed option to increase dose of Ozempic for improved glycemic control but patient preferred to remain on current dose. - Recommend to continue Farxiga 10 mg daily. - Recommend to check fasting and postprandial BG daily - Meets financial criteria for Ozempic and [place] patient assistance program through [employer] and [place], respectively. Patient completed re-enrollment for [place] - we will follow-up to confirm enrollment for 2025 was successful. Will submit application for [employer] for 2024 and 2025.    Hypertension: - Currently controlled - Recommend to continue  olmesartan 20 mg daily     Hyperlipidemia/ASCVD Risk Reduction: - Currently controlled. LDL at goal of < 70 mg/dL. - Recommend to continue pitavastatin 4 mg daily     Follow Up Plan: will follow up with enrollment in December  Janeice Robinson, PharmD, [place]y [place], CPP Clinical Pharmacist [facility] [phone]

## 2023-04-19 ENCOUNTER — Inpatient Hospital Stay: Payer: Medicare Other | Attending: Hematology and Oncology | Admitting: Hematology and Oncology

## 2023-04-19 VITALS — BP 141/62 | HR 78 | Temp 98.0°F | Resp 16 | Wt 150.1 lb

## 2023-04-19 DIAGNOSIS — Z923 Personal history of irradiation: Secondary | ICD-10-CM | POA: Insufficient documentation

## 2023-04-19 DIAGNOSIS — C50512 Malignant neoplasm of lower-outer quadrant of left female breast: Secondary | ICD-10-CM | POA: Diagnosis not present

## 2023-04-19 DIAGNOSIS — Z170 Estrogen receptor positive status [ER+]: Secondary | ICD-10-CM

## 2023-04-19 DIAGNOSIS — Z79899 Other long term (current) drug therapy: Secondary | ICD-10-CM | POA: Diagnosis not present

## 2023-04-19 NOTE — Assessment & Plan Note (Addendum)
05/18/2022: Screening mammogram detected left breast mass by ultrasound it measured 9 mm, biopsy revealed grade 2 IDC ER 90%, PR 15%, Ki-67 50%, HER2 2+ by IHC, FISH negative ratio 2.17, Copy #3.9, additional 20 cells were examined ratio came at 2.19 and copy #3.72    06/21/2022: Left lumpectomy: Grade 3 IDC 1.5 cm, margins negative, no lymph node (adipose tissue was in the sentinel node) ER 90% weak to moderate, PR 15% weak to strong, HER2 negative, Ki-67 50% Repeat prognostic panel: ER 0%, PR 0%, Ki-67 35%, HER2 2+ by IHC FISH negative ratio 1.72 (triple negative breast cancer)   Treatment plan: Oncotype DX testing was canceled because the cancer was triple negative.  Recommend adjuvant chemotherapy with Taxotere and Cytoxan every 3 weeks x 3 completed 09/20/2022 Adjuvant radiation therapy completed 11/23/2022 -----------------------------------------------------------------------------------------------------------------------------------  Current treatment: Surveillance Signatera testing: +0.02 CT CAP and bone scan 04/12/2023: Few tiny lung nodules 5 mm in size or smaller.  Nonspecific. Bone scan: No evidence of metastatic disease.  Recommendation: Recheck CT scans in 6 months and f/u after

## 2023-04-19 NOTE — Progress Notes (Signed)
Patient Care Team: Dorothyann Peng, MD as PCP - General (Internal Medicine) Serena Croissant, MD as Consulting Physician (Hematology and Oncology) Harriette Bouillon, MD as Consulting Physician (General Surgery) Dorothy Puffer, MD as Consulting Physician (Radiation Oncology)  DIAGNOSIS:  Encounter Diagnosis  Name Primary?   Malignant neoplasm of lower-outer quadrant of left breast of female, estrogen receptor positive (HCC) Yes    SUMMARY OF ONCOLOGIC HISTORY: Oncology History  Malignant neoplasm of lower-outer quadrant of left breast of female, estrogen receptor positive (HCC)  05/18/2022 Initial Diagnosis   05/18/2022: Screening mammogram detected left breast mass by ultrasound it measured 9 mm, biopsy revealed grade 2 IDC ER 90%, PR 15%, Ki-67 50%, HER2 2+ by IHC, FISH negative ratio 2.17, Copy #3.9, additional 20 cells were examined ratio came at 2.19 and copy #3.72   06/05/2022 Cancer Staging   Staging form: Breast, AJCC 8th Edition - Clinical: Stage IA (cT1b, cN0, cM0, G2, ER+, PR+, HER2-) - Signed by Ronny Bacon, PA-C on 06/05/2022 Method of lymph node assessment: Clinical Histologic grading system: 3 grade system   06/21/2022 Surgery   Left lumpectomy: Grade 3 IDC 1.5 cm, margins negative, no lymph node (adipose tissue was in the sentinel node) ER 90% weak to moderate, PR 15% weak to strong, HER2 negative, Ki-67 50% Repeat prognostic panel: ER 0%, PR 0%, Ki-67 35%, HER2 2+ by IHC FISH negative ratio 1.72 (triple negative breast cancer)   08/09/2022 - 09/22/2022 Chemotherapy   Patient is on Treatment Plan : BREAST TC q21d     10/10/2022 Cancer Staging   Staging form: Breast, AJCC 8th Edition - Pathologic stage from 10/10/2022: Stage IB (pT1c, pN0, cM0, G3, ER-, PR-, HER2-) - Signed by Ronny Bacon, PA-C on 10/10/2022 Histologic grading system: 3 grade system   10/27/2022 - 11/23/2022 Radiation Therapy   Plan Name: Breast_L_BH Site: Breast, Left Technique: 3D Mode:  Photon Dose Per Fraction: 2.66 Gy Prescribed Dose (Delivered / Prescribed): 42.56 Gy / 42.56 Gy Prescribed Fxs (Delivered / Prescribed): 16 / 16   Plan Name: Brst_L_Bst_BH Site: Breast, Left Technique: 3D Mode: Photon Dose Per Fraction: 2 Gy Prescribed Dose (Delivered / Prescribed): 8 Gy / 8 Gy Prescribed Fxs (Delivered / Prescribed): 4 / 4     CHIEF COMPLIANT: Follow-up to discuss results of CT scans  HISTORY OF PRESENT ILLNESS:  History of Present Illness Ms. Nicole Nunez is a 77 year old with above-mentioned history of breast cancer who is currently under surveillance.  She had Signatera testing which was positive and therefore she underwent CT scans and is here today to discuss results.         ALLERGIES:  is allergic to statins.  MEDICATIONS:  Current Outpatient Medications  Medication Sig Dispense Refill   Ascorbic Acid (VITAMIN C) 1000 MG tablet Take 1,000 mg by mouth daily.     aspirin EC 81 MG tablet Take 81 mg by mouth daily.     b complex vitamins tablet Take 1 tablet by mouth daily.     Calcium Carbonate (CALCIUM 600 PO) Take 600 mg by mouth daily.     Cholecalciferol (VITAMIN D3) 125 MCG (5000 UT) CAPS Take 5,000 Units by mouth daily.     dapagliflozin propanediol (FARXIGA) 10 MG TABS tablet Take 1 tablet (10 mg total) by mouth daily. 90 tablet 2   hydrOXYzine (ATARAX) 10 MG tablet TAKE 1-2 TABLETS BY MOUTH EVERY EVENING AS NEEDED (Patient not taking: Reported on 04/17/2023) 180 tablet 1   Lancet Devices (ONE [facility]  LANCING DEV) MISC USE AS DIRECTED TO CHECK BLOOD SUGARS ONCE DAILY dx: e11.22 1 each 2   loratadine (CLARITIN) 10 MG tablet TAKE 1 TABLET BY MOUTH EVERY DAY 90 tablet 1   magnesium gluconate (MAGONATE) 500 MG tablet Take 500 mg by mouth daily.     Multiple Vitamin (MULTIVITAMIN) tablet Take 1 tablet by mouth daily.     olmesartan (BENICAR) 20 MG tablet TAKE 1 TABLET BY MOUTH DAILY 100 tablet 2   OneTouch Delica Lancets 33G MISC Use daily with one  touch meter 100 each 2   ONETOUCH VERIO test strip USE TO CHECK BLOOD SUGAR DAILY 100 strip 2   pantoprazole (PROTONIX) 40 MG tablet Take 40 mg by mouth daily.     Pitavastatin Magnesium 4 MG TABS Take 1 tablet (4 mg total) by mouth daily. 30 tablet 11   Semaglutide,0.25 or 0.5MG /DOS, (OZEMPIC, 0.25 OR 0.5 MG/DOSE,) 2 MG/3ML SOPN Inject 0.5mg  SQ weekly. 3 mL 0   zinc gluconate 50 MG tablet Take 50 mg by mouth daily.     No current facility-administered medications for this visit.    PHYSICAL EXAMINATION: ECOG PERFORMANCE STATUS: 1 - Symptomatic but completely ambulatory  Vitals:   04/19/23 1351  BP: (!) 141/62  Pulse: 78  Resp: 16  Temp: 98 F (36.7 C)  SpO2: 100%   Filed Weights   04/19/23 1351  Weight: 150 lb 1.6 oz (68.1 kg)    Physical Exam          (exam performed in the presence of a chaperone)  LABORATORY DATA:  I have reviewed the data as listed    Latest Ref Rng & Units 03/15/2023    4:07 PM 10/25/2022   12:13 PM 09/20/2022    9:28 AM  CMP  Glucose 70 - 99 mg/dL 88  469  629   BUN 8 - 27 mg/dL 20  16  26    Creatinine 0.57 - 1.00 mg/dL 5.28  4.13  2.44   Sodium 134 - 144 mmol/L 139  138  138   Potassium 3.5 - 5.2 mmol/L 4.0  4.2  4.5   Chloride 96 - 106 mmol/L 100  107  104   CO2 20 - 29 mmol/L 23  26  27    Calcium 8.7 - 10.3 mg/dL 9.3  8.7  9.1   Total Protein 6.0 - 8.5 g/dL 6.6   6.4   Total Bilirubin 0.0 - 1.2 mg/dL 0.9   0.5   Alkaline Phos 44 - 121 IU/L 112   106   AST 0 - 40 IU/L 28   27   ALT 0 - 32 IU/L 20   30     Lab Results  Component Value Date   WBC 4.6 03/15/2023   HGB 11.5 03/15/2023   HCT 36.0 03/15/2023   MCV 87 03/15/2023   PLT 251 03/15/2023   NEUTROABS 5.4 09/20/2022    ASSESSMENT & PLAN:  Malignant neoplasm of lower-outer quadrant of left breast of female, estrogen receptor positive (HCC) 05/18/2022: Screening mammogram detected left breast mass by ultrasound it measured 9 mm, biopsy revealed grade 2 IDC ER 90%, PR 15%,  Ki-67 50%, HER2 2+ by IHC, FISH negative ratio 2.17, Copy #3.9, additional 20 cells were examined ratio came at 2.19 and copy #3.72    06/21/2022: Left lumpectomy: Grade 3 IDC 1.5 cm, margins negative, no lymph node (adipose tissue was in the sentinel node) ER 90% weak to moderate, PR 15% weak to strong, HER2  negative, Ki-67 50% Repeat prognostic panel: ER 0%, PR 0%, Ki-67 35%, HER2 2+ by IHC FISH negative ratio 1.72 (triple negative breast cancer)   Treatment plan: Oncotype DX testing was canceled because the cancer was triple negative.  Recommend adjuvant chemotherapy with Taxotere and Cytoxan every 3 weeks x 3 completed 09/20/2022 Adjuvant radiation therapy completed 11/23/2022 -----------------------------------------------------------------------------------------------------------------------------------  Current treatment: Surveillance Signatera testing: +0.02 CT CAP and bone scan 04/12/2023: Few tiny lung nodules 5 mm in size or smaller.  Nonspecific. Bone scan: No evidence of metastatic disease.  Recommendation: Recheck CT scans in 6 months and f/u after    Orders Placed This Encounter  Procedures   CT CHEST ABDOMEN PELVIS W CONTRAST    Standing Status:   Future    Standing Expiration Date:   04/18/2024    Order Specific Question:   If indicated for the ordered procedure, I authorize the administration of contrast media per Radiology protocol    Answer:   Yes    Order Specific Question:   Does the patient have a contrast media/X-ray dye allergy?    Answer:   No    Order Specific Question:   Preferred imaging location?    Answer:   [facility]    Order Specific Question:   Release to patient    Answer:   Immediate    Order Specific Question:   If indicated for the ordered procedure, I authorize the administration of oral contrast media per Radiology protocol    Answer:   Yes   The patient has a good understanding of the overall plan. she agrees with it. she will call  with any problems that may develop before the next visit here. Total time spent: 30 mins including face to face time and time spent for planning, charting and co-ordination of care   Tamsen Meek, MD 04/19/23

## 2023-04-24 DIAGNOSIS — E118 Type 2 diabetes mellitus with unspecified complications: Secondary | ICD-10-CM | POA: Diagnosis not present

## 2023-04-24 DIAGNOSIS — M79675 Pain in left toe(s): Secondary | ICD-10-CM | POA: Diagnosis not present

## 2023-04-24 DIAGNOSIS — L84 Corns and callosities: Secondary | ICD-10-CM | POA: Diagnosis not present

## 2023-04-24 DIAGNOSIS — B351 Tinea unguium: Secondary | ICD-10-CM | POA: Diagnosis not present

## 2023-05-01 ENCOUNTER — Ambulatory Visit: Payer: Medicare Other | Attending: Surgery

## 2023-05-01 VITALS — Wt 147.2 lb

## 2023-05-01 DIAGNOSIS — Z483 Aftercare following surgery for neoplasm: Secondary | ICD-10-CM | POA: Insufficient documentation

## 2023-05-01 NOTE — Therapy (Signed)
OUTPATIENT PHYSICAL THERAPY SOZO SCREENING NOTE   Patient Name: Nicole Nunez MRN: [id number] DOB:Jul 01, 1945, 77 y.o., female Today's Date: 05/01/2023  PCP: Dorothyann Peng, MD REFERRING PROVIDER: Harriette Bouillon, MD   PT End of Session - 05/01/23 0854     Visit Number 1   # unchanged due to screen only   PT Start Time 0853    PT Stop Time 0857    PT Time Calculation (min) 4 min    Activity Tolerance Patient tolerated treatment well    Behavior During Therapy [facility] for tasks assessed/performed             Past Medical History:  Diagnosis Date   Arthritis    Breast cancer (HCC) 05/18/2022   CKD stage 3 due to type 2 diabetes mellitus (HCC)    DM (diabetes mellitus) (HCC)    GERD (gastroesophageal reflux disease)    High cholesterol    HTN (hypertension)    Past Surgical History:  Procedure Laterality Date   BREAST BIOPSY Left 05/18/2022   [place] LT BREAST BX W LOC DEV 1ST LESION IMG BX SPEC US GUIDE 05/18/2022 [facility] MAMMOGRAPHY   BREAST BIOPSY Left 06/20/2022   [place] LT RADIOACTIVE SEED LOC 06/20/2022 [facility] MAMMOGRAPHY   BREAST LUMPECTOMY WITH RADIOACTIVE SEED AND AXILLARY LYMPH NODE DISSECTION Left 06/21/2022   Procedure: LEFT BREAST LUMPECTOMY WITH RADIOACTIVE SEED AND AXILLARY NODE DISSECTION;  Surgeon: Harriette Bouillon, MD;  Location: [facility];  Service: General;  Laterality: Left;   CATARACT EXTRACTION, BILATERAL  01/2018   first was 12/2017   corn removal Bilateral 06/2017   EYE SURGERY     PORT-A-CATH REMOVAL N/A 11/01/2022   Procedure: REMOVAL PORT-A-CATH;  Surgeon: Harriette Bouillon, MD;  Location: [facility];  Service: General;  Laterality: N/A;   PORTACATH PLACEMENT N/A 07/21/2022   Procedure: INSERTION PORT-A-CATH;  Surgeon: Harriette Bouillon, MD;  Location: [facility] OR;  Service: General;  Laterality: N/A;  90 MIN ROOM 4-OK'D PER KELLY   SENTINEL NODE BIOPSY Left 07/21/2022   Procedure: LEFT SENTINEL NODE BIOPSY;  Surgeon: Harriette Bouillon,  MD;  Location: [facility] OR;  Service: General;  Laterality: Left;   Patient Active Problem List   Diagnosis Date Noted   Encounter for general adult medical examination w/o abnormal findings 03/22/2023   Mild nonproliferative diabetic retinopathy of both eyes associated with type 2 diabetes mellitus (HCC) 03/22/2023   Port-A-Cath in place 08/16/2022   Malignant neoplasm of lower-outer quadrant of left breast of female, estrogen receptor positive (HCC) 05/31/2022   Statin myopathy 02/04/2021   Notalgia 01/29/2020   Vitamin D deficiency disease 01/29/2020   Class 1 obesity due to excess calories with serious comorbidity and body mass index (BMI) of 30.0 to 30.9 in adult 01/29/2020   Type 2 diabetes mellitus with stage 3 chronic kidney disease, without long-term current use of insulin (HCC) 05/24/2018   Chronic renal disease, stage III (HCC) 05/24/2018   Parenchymal renal hypertension 05/24/2018   Pure hypercholesterolemia 05/24/2018   Class 1 obesity due to excess calories with serious comorbidity and body mass index (BMI) of 31.0 to 31.9 in adult 05/24/2018    REFERRING DIAG: left breast cancer at risk for lymphedema  THERAPY DIAG:  Aftercare following surgery for neoplasm  PERTINENT HISTORY: Patient was diagnosed left IDC. It measures 9mm and is located in the upper outer quadrant. It is ER/PR positive with a Ki67 of 50%.  lumpectomy and SLNB on 06/21/22 but with no lymph nodes revealed in  tissue. Will be having chemotherapy TC followed by radiation as well as sentinel lymph node mapping.   PRECAUTIONS: left UE Lymphedema risk, None  SUBJECTIVE: Pt returns for her 3 month L-Dex screen.   PAIN:  Are you having pain? No  SOZO SCREENING: Patient was assessed today using the SOZO machine to determine the lymphedema index score. This was compared to her baseline score. It was determined that she is within the recommended range when compared to her baseline and no further action is needed at  this time. She will continue SOZO screenings. These are done every 3 months for 2 years post operatively followed by every 6 months for 2 years, and then annually.   L-DEX FLOWSHEETS - 05/01/23 0800       L-DEX LYMPHEDEMA SCREENING   Measurement Type Unilateral    L-DEX MEASUREMENT EXTREMITY Upper Extremity    POSITION  Standing    DOMINANT SIDE Right    At Risk Side Left    BASELINE SCORE (UNILATERAL) 1.2    L-DEX SCORE (UNILATERAL) 4.2    VALUE CHANGE (UNILAT) 3               Hermenia Bers, PTA 05/01/2023, 8:56 AM

## 2023-05-08 NOTE — Progress Notes (Signed)
Received fax from [employer] they could not verify patient's insurance.  Completed and faxed insurance verification form as well as a copy of insurance card were faxed to [employer]

## 2023-05-19 ENCOUNTER — Other Ambulatory Visit: Payer: Self-pay

## 2023-05-19 MED ORDER — OZEMPIC (0.25 OR 0.5 MG/DOSE) 2 MG/3ML ~~LOC~~ SOPN: PEN_INJECTOR | SUBCUTANEOUS | 0 refills | Status: DC

## 2023-05-25 ENCOUNTER — Ambulatory Visit
Admission: RE | Admit: 2023-05-25 | Discharge: 2023-05-25 | Disposition: A | Discharge status: Home / Self Care | Payer: Medicare Other | Source: Ambulatory Visit | Attending: Adult Health | Admitting: Adult Health

## 2023-05-25 DIAGNOSIS — Z853 Personal history of malignant neoplasm of breast: Secondary | ICD-10-CM | POA: Diagnosis not present

## 2023-05-25 DIAGNOSIS — Z170 Estrogen receptor positive status [ER+]: Secondary | ICD-10-CM

## 2023-05-25 LAB — HM MAMMOGRAPHY

## 2023-05-30 ENCOUNTER — Telehealth: Payer: Self-pay

## 2023-05-30 NOTE — Telephone Encounter (Signed)
 PAP: PAP application for Farxiga , ([employer] ([employer])) has been mailed to pt's home address on file. Will fax provider portion of application to provider's office when pt's portion is received.   Please note I have faxed the provider portion to Dr. Catheryn Slocumb

## 2023-06-05 NOTE — Telephone Encounter (Signed)
 PAP: Patient assistance application Ozempic  for has been approved by PAP Companies: [employer] from 05/24/2023 to 05/22/2024. Medication should be delivered to PAP Delivery: Provider's office For further shipping updates, please contact [name] at [phone] Pt ID is: [id number]

## 2023-06-09 DIAGNOSIS — Z170 Estrogen receptor positive status [ER+]: Secondary | ICD-10-CM | POA: Diagnosis not present

## 2023-06-09 DIAGNOSIS — C50512 Malignant neoplasm of lower-outer quadrant of left female breast: Secondary | ICD-10-CM | POA: Diagnosis not present

## 2023-06-09 NOTE — Telephone Encounter (Signed)
PAP: Application for Nicole Nunez has been submitted to [employer] ([employer]), via fax

## 2023-06-12 NOTE — Progress Notes (Signed)
Pharmacy Medication Assistance Program Note    06/12/2023  Patient ID: Nicole Nunez, female   DOB: February 28, 1946, 78 y.o.   MRN: [id number]     04/17/2023 05/30/2023  Outreach Medication One  Initial Outreach Date (Medication One) 04/17/2023 05/30/2023  Manufacturer Medication One [employer] Music therapist  [employer] [facility]  Farxiga  Dose of Farxiga  10mg /day  [facility] Ozempic   Dose of Ozempic 0.5 mg   Type of Forensic scientist Assistance  Date Application Sent to Patient 04/17/2023 05/30/2023  Application Items Requested Application Application;Proof of Income  Date Application Sent to Prescriber 04/17/2023 05/30/2023  Name of Prescriber Robyn Gillian Shields  Date Application Received From Patient 04/17/2023 06/09/2023  Application Items Received From Patient Application   Date Application Received From Provider 04/17/2023 06/02/2023  Date Application Submitted to Manufacturer 04/17/2023 06/09/2023  Method Application Sent to Manufacturer Online   Patient Assistance Determination  Approved  Approval Start Date  05/24/2023  Approval End Date  05/22/2024  Patient Notification Method  MyChart       06/05/2023  Outreach Medication Two  Manufacturer Medication Two [name]  [facility] Ozempic  Dose of Ozempic 0.5mg /week  Type of Radiographer, therapeutic Assistance  Name of Scientist, research (physical sciences)  Patient Assistance Determination Approved  Approval Start Date 05/24/2023     Signature Tresea Mall, CPHT/Patient Advocate [place] Direct Line: [phone] Fax: [phone]

## 2023-06-12 NOTE — Telephone Encounter (Addendum)
PAP: Patient assistance application Marcelline Deist for has been approved by PAP Companies: [employer] from 05/24/2023 to 05/22/2024. Medication should be delivered to PAP Delivery: Home For further shipping updates, please contact [employer] ([employer]) at [phone] Pt ID is: [id number]

## 2023-06-22 ENCOUNTER — Inpatient Hospital Stay: Payer: Medicare Other | Attending: Hematology and Oncology | Admitting: Hematology and Oncology

## 2023-06-22 DIAGNOSIS — Z170 Estrogen receptor positive status [ER+]: Secondary | ICD-10-CM

## 2023-06-22 DIAGNOSIS — C50512 Malignant neoplasm of lower-outer quadrant of left female breast: Secondary | ICD-10-CM | POA: Diagnosis not present

## 2023-06-22 NOTE — Assessment & Plan Note (Signed)
05/18/2022: Screening mammogram detected left breast mass by ultrasound it measured 9 mm, biopsy revealed grade 2 IDC ER 90%, PR 15%, Ki-67 50%, HER2 2+ by IHC, FISH negative ratio 2.17, Copy #3.9, additional 20 cells were examined ratio came at 2.19 and copy #3.72    06/21/2022: Left lumpectomy: Grade 3 IDC 1.5 cm, margins negative, no lymph node (adipose tissue was in the sentinel node) ER 90% weak to moderate, PR 15% weak to strong, HER2 negative, Ki-67 50% Repeat prognostic panel: ER 0%, PR 0%, Ki-67 35%, HER2 2+ by IHC FISH negative ratio 1.72 (triple negative breast cancer)   Treatment plan: Oncotype DX testing was canceled because the cancer was triple negative.  Recommend adjuvant chemotherapy with Taxotere and Cytoxan every 3 weeks x 3 completed 09/20/2022 Adjuvant radiation therapy completed 11/23/2022 -----------------------------------------------------------------------------------------------------------------------------------  Current treatment: Surveillance Signatera testing: +0.02 CT CAP and bone scan 04/12/2023: Few tiny lung nodules 5 mm in size or smaller.  Nonspecific. Bone scan: No evidence of metastatic disease. Mammogram 05/25/2023: Benign breast density category B   Recommendation: Recheck CT scans in 6 months and f/u after

## 2023-06-22 NOTE — Progress Notes (Signed)
HEMATOLOGY-ONCOLOGY TELEPHONE VISIT PROGRESS NOTE  I connected with our patient on 06/22/23 at  8:00 AM EST by telephone and verified that I am speaking with the correct person using two identifiers.  I discussed the limitations, risks, security and privacy concerns of performing an evaluation and management service by telephone and the availability of in person appointments.  I also discussed with the patient that there may be a patient responsible charge related to this service. The patient expressed understanding and agreed to proceed.   History of Present Illness: Follow-up to discuss results of Signatera testing  History of Present Illness Ms. Nicole Nunez is 78 year old with prior history of triple negative breast cancer who had completed surgery adjuvant chemotherapy and radiation.  She is undergoing Signatera testing which came back positive at 0.71.  In November she had whole-body scans which were negative.  She does not have any new symptoms.          Oncology History  Malignant neoplasm of lower-outer quadrant of left breast of female, estrogen receptor positive (HCC)  05/18/2022 Initial Diagnosis   05/18/2022: Screening mammogram detected left breast mass by ultrasound it measured 9 mm, biopsy revealed grade 2 IDC ER 90%, PR 15%, Ki-67 50%, HER2 2+ by IHC, FISH negative ratio 2.17, Copy #3.9, additional 20 cells were examined ratio came at 2.19 and copy #3.72   06/05/2022 Cancer Staging   Staging form: Breast, AJCC 8th Edition - Clinical: Stage IA (cT1b, cN0, cM0, G2, ER+, PR+, HER2-) - Signed by Ronny Bacon, PA-C on 06/05/2022 Method of lymph node assessment: Clinical Histologic grading system: 3 grade system   06/21/2022 Surgery   Left lumpectomy: Grade 3 IDC 1.5 cm, margins negative, no lymph node (adipose tissue was in the sentinel node) ER 90% weak to moderate, PR 15% weak to strong, HER2 negative, Ki-67 50% Repeat prognostic panel: ER 0%, PR 0%, Ki-67 35%, HER2 2+ by IHC  FISH negative ratio 1.72 (triple negative breast cancer)   08/09/2022 - 09/22/2022 Chemotherapy   Patient is on Treatment Plan : BREAST TC q21d     10/10/2022 Cancer Staging   Staging form: Breast, AJCC 8th Edition - Pathologic stage from 10/10/2022: Stage IB (pT1c, pN0, cM0, G3, ER-, PR-, HER2-) - Signed by Ronny Bacon, PA-C on 10/10/2022 Histologic grading system: 3 grade system   10/27/2022 - 11/23/2022 Radiation Therapy   Plan Name: Breast_L_BH Site: Breast, Left Technique: 3D Mode: Photon Dose Per Fraction: 2.66 Gy Prescribed Dose (Delivered / Prescribed): 42.56 Gy / 42.56 Gy Prescribed Fxs (Delivered / Prescribed): 16 / 16   Plan Name: Brst_L_Bst_BH Site: Breast, Left Technique: 3D Mode: Photon Dose Per Fraction: 2 Gy Prescribed Dose (Delivered / Prescribed): 8 Gy / 8 Gy Prescribed Fxs (Delivered / Prescribed): 4 / 4     REVIEW OF SYSTEMS:   Constitutional: Denies fevers, chills or abnormal weight loss All other systems were reviewed with the patient and are negative. Observations/Objective:     Assessment Plan:  Malignant neoplasm of lower-outer quadrant of left breast of female, estrogen receptor positive (HCC) 05/18/2022: Screening mammogram detected left breast mass by ultrasound it measured 9 mm, biopsy revealed grade 2 IDC ER 90%, PR 15%, Ki-67 50%, HER2 2+ by IHC, FISH negative ratio 2.17, Copy #3.9, additional 20 cells were examined ratio came at 2.19 and copy #3.72    06/21/2022: Left lumpectomy: Grade 3 IDC 1.5 cm, margins negative, no lymph node (adipose tissue was in the sentinel node) ER 90% weak to moderate,  PR 15% weak to strong, HER2 negative, Ki-67 50% Repeat prognostic panel: ER 0%, PR 0%, Ki-67 35%, HER2 2+ by IHC FISH negative ratio 1.72 (triple negative breast cancer)   Treatment plan: Oncotype DX testing was canceled because the cancer was triple negative.  Recommend adjuvant chemotherapy with Taxotere and Cytoxan every 3 weeks x 3 completed  09/20/2022 Adjuvant radiation therapy completed 11/23/2022 -----------------------------------------------------------------------------------------------------------------------------------  Current treatment: Surveillance Signatera testing: +0.02 CT CAP and bone scan 04/12/2023: Few tiny lung nodules 5 mm in size or smaller.  Nonspecific. Bone scan: No evidence of metastatic disease. Mammogram 05/25/2023: Benign breast density category B   Recommendation: Recheck CT scans in May and f/u after She will have another Signatera testing in 3 months.     I discussed the assessment and treatment plan with the patient. The patient was provided an opportunity to ask questions and all were answered. The patient agreed with the plan and demonstrated an understanding of the instructions. The patient was advised to call back or seek an in-person evaluation if the symptoms worsen or if the condition fails to improve as anticipated.   I provided 20 minutes of non-face-to-face time during this encounter.  This includes time for charting and coordination of care   Tamsen Meek, MD

## 2023-06-23 ENCOUNTER — Encounter: Payer: Self-pay | Admitting: Hematology and Oncology

## 2023-07-11 ENCOUNTER — Other Ambulatory Visit: Payer: Self-pay

## 2023-07-11 MED ORDER — OZEMPIC (0.25 OR 0.5 MG/DOSE) 2 MG/3ML ~~LOC~~ SOPN: PEN_INJECTOR | SUBCUTANEOUS | 0 refills | Status: DC

## 2023-07-25 ENCOUNTER — Encounter: Payer: Self-pay | Admitting: Internal Medicine

## 2023-07-25 ENCOUNTER — Other Ambulatory Visit: Payer: Self-pay

## 2023-07-25 ENCOUNTER — Ambulatory Visit: Payer: Medicare Other | Admitting: Internal Medicine

## 2023-07-25 VITALS — BP 124/70 | HR 73 | Temp 98.4°F | Ht <= 58 in | Wt 150.6 lb

## 2023-07-25 DIAGNOSIS — E1122 Type 2 diabetes mellitus with diabetic chronic kidney disease: Secondary | ICD-10-CM | POA: Diagnosis not present

## 2023-07-25 DIAGNOSIS — I129 Hypertensive chronic kidney disease with stage 1 through stage 4 chronic kidney disease, or unspecified chronic kidney disease: Secondary | ICD-10-CM | POA: Diagnosis not present

## 2023-07-25 DIAGNOSIS — N1831 Chronic kidney disease, stage 3a: Secondary | ICD-10-CM

## 2023-07-25 DIAGNOSIS — E66811 Obesity, class 1: Secondary | ICD-10-CM

## 2023-07-25 DIAGNOSIS — E6609 Other obesity due to excess calories: Secondary | ICD-10-CM

## 2023-07-25 DIAGNOSIS — Z6831 Body mass index (BMI) 31.0-31.9, adult: Secondary | ICD-10-CM

## 2023-07-25 DIAGNOSIS — E7800 Pure hypercholesterolemia, unspecified: Secondary | ICD-10-CM

## 2023-07-25 MED ORDER — OLMESARTAN MEDOXOMIL 20 MG PO TABS: 20.0000 mg | ORAL_TABLET | Freq: Every day | ORAL | 2 refills | Status: DC

## 2023-07-25 MED ORDER — PITAVASTATIN MAGNESIUM 4 MG PO TABS: 1.0000 | ORAL_TABLET | Freq: Every day | ORAL | 2 refills | Status: DC

## 2023-07-25 MED ORDER — LORATADINE 10 MG PO TABS
10.0000 mg | ORAL_TABLET | Freq: Every day | ORAL | 1 refills | Status: AC
Start: 2023-07-25 — End: ?

## 2023-07-25 NOTE — Assessment & Plan Note (Signed)
Chronic, LDL goal is less than 70.  She will continue with pitavastatin 4mg  daily. Encouraged to follow a heart healthy lifestyle.

## 2023-07-25 NOTE — Assessment & Plan Note (Signed)
 Chronic, controlled. Optimal BP<120/80.  She will continue with olmesartan 20mg  daily. Encouraged to follow low sodium diet. She will f/u in four to six months for re-evaluation.

## 2023-07-25 NOTE — Patient Instructions (Signed)

## 2023-07-25 NOTE — Assessment & Plan Note (Signed)
 Chronic.  She will continue with [place] 10mg  daily and Ozempic 0.5mg  weekly.  She is thankful for patient assistance.  She will f/u in four months for re-evaluation.

## 2023-07-25 NOTE — Progress Notes (Signed)
 I,Nicole Nunez, CMA,acting as a Neurosurgeon for Nicole Aliment, MD.,have documented all relevant documentation on the behalf of Nicole Aliment, MD,as directed by  Nicole Aliment, MD while in the presence of Nicole Aliment, MD.  Subjective:  Patient ID: Nicole Nunez , female    DOB: 10/20/1945 , 78 y.o.   MRN: [id number]  Chief Complaint  Patient presents with   Diabetes   Hypertension   Hyperlipidemia    HPI  Patient  is here today for a diabetes/htn check. She reports compliance with meds. She denies headaches, chest pain and shortness of breath. She has no specific questions or concerns.   Diabetes She presents for her follow-up diabetic visit. She has type 2 diabetes mellitus. Her disease course has been stable. There are no hypoglycemic associated symptoms. Pertinent negatives for diabetes include no blurred vision, no polydipsia, no polyphagia and no polyuria. There are no hypoglycemic complications. Current diabetic treatment includes oral agent (dual therapy). She is compliant with treatment most of the time. She is following a diabetic diet. She participates in exercise intermittently. An ACE inhibitor/angiotensin II receptor blocker is being taken. Eye exam is current.  Hypertension This is a chronic problem. The current episode started more than 1 year ago. The problem has been gradually improving since onset. The problem is controlled. Pertinent negatives include no blurred vision or palpitations. Hypertensive end-organ damage includes kidney disease.     Past Medical History:  Diagnosis Date   Arthritis    Breast cancer (HCC) 05/18/2022   CKD stage 3 due to type 2 diabetes mellitus (HCC)    DM (diabetes mellitus) (HCC)    GERD (gastroesophageal reflux disease)    High cholesterol    HTN (hypertension)      Family History  Problem Relation Age of Onset   Asthma Mother    Hypertension Mother    Alzheimer's disease Mother    Early death Father    Asthma Sister     Diabetes Sister    Heart disease Sister    Hypertension Sister      Current Outpatient Medications:    Ascorbic Acid (VITAMIN C) 1000 MG tablet, Take 1,000 mg by mouth daily., Disp: , Rfl:    aspirin EC 81 MG tablet, Take 81 mg by mouth daily., Disp: , Rfl:    b complex vitamins tablet, Take 1 tablet by mouth daily., Disp: , Rfl:    Calcium Carbonate (CALCIUM 600 PO), Take 600 mg by mouth daily., Disp: , Rfl:    Cholecalciferol (VITAMIN D3) 125 MCG (5000 UT) CAPS, Take 5,000 Units by mouth daily., Disp: , Rfl:    dapagliflozin propanediol (FARXIGA) 10 MG TABS tablet, Take 1 tablet (10 mg total) by mouth daily., Disp: 90 tablet, Rfl: 2   Lancet Devices (ONE TOUCH DELICA LANCING DEV) MISC, USE AS DIRECTED TO CHECK BLOOD SUGARS ONCE DAILY dx: e11.22, Disp: 1 each, Rfl: 2   magnesium gluconate (MAGONATE) 500 MG tablet, Take 500 mg by mouth daily., Disp: , Rfl:    Multiple Vitamin (MULTIVITAMIN) tablet, Take 1 tablet by mouth daily., Disp: , Rfl:    OneTouch Delica Lancets 33G MISC, Use daily with one touch meter, Disp: 100 each, Rfl: 2   ONETOUCH VERIO test strip, USE TO CHECK BLOOD SUGAR DAILY, Disp: 100 strip, Rfl: 2   pantoprazole (PROTONIX) 40 MG tablet, Take 40 mg by mouth daily., Disp: , Rfl:    Semaglutide,0.25 or 0.5MG /DOS, (OZEMPIC, 0.25 OR 0.5 MG/DOSE,) 2  MG/3ML SOPN, Inject 0.5mg  SQ weekly., Disp: 3 mL, Rfl: 0   zinc gluconate 50 MG tablet, Take 50 mg by mouth daily., Disp: , Rfl:    loratadine (CLARITIN) 10 MG tablet, Take 1 tablet (10 mg total) by mouth daily., Disp: 90 tablet, Rfl: 1   olmesartan (BENICAR) 20 MG tablet, Take 1 tablet (20 mg total) by mouth daily., Disp: 100 tablet, Rfl: 2   Pitavastatin Magnesium 4 MG TABS, Take 1 tablet (4 mg total) by mouth daily., Disp: 90 tablet, Rfl: 2   Allergies  Allergen Reactions   Statins Other (See Comments)    Rosuvastatin, atorvastatin, pravastatin, simvastatin muscle aching      Review of Systems  Constitutional: Negative.    Eyes:  Negative for blurred vision.  Respiratory: Negative.    Cardiovascular: Negative.  Negative for palpitations.  Gastrointestinal: Negative.   Endocrine: Negative for polydipsia, polyphagia and polyuria.  Neurological: Negative.   Psychiatric/Behavioral: Negative.       Today's Vitals   07/25/23 0920  BP: 124/70  Pulse: 73  Temp: 98.4 F (36.9 C)  SpO2: 98%  Weight: 150 lb 9.6 oz (68.3 kg)  Height: 4\' 10"  (1.473 m)   Body mass index is 31.48 kg/m.  Wt Readings from Last 3 Encounters:  07/25/23 150 lb 9.6 oz (68.3 kg)  05/01/23 147 lb 4 oz (66.8 kg)  04/19/23 150 lb 1.6 oz (68.1 kg)     Objective:  Physical Exam Vitals and nursing note reviewed.  Constitutional:      Appearance: Normal appearance.  HENT:     Head: Normocephalic and atraumatic.  Eyes:     Extraocular Movements: Extraocular movements intact.  Cardiovascular:     Rate and Rhythm: Normal rate and regular rhythm.     Heart sounds: Normal heart sounds.  Pulmonary:     Effort: Pulmonary effort is normal.     Breath sounds: Normal breath sounds.  Musculoskeletal:     Cervical back: Normal range of motion.  Skin:    General: Skin is warm.  Neurological:     General: No focal deficit present.     Mental Status: She is alert.  Psychiatric:        Mood and Affect: Mood normal.        Behavior: Behavior normal.         Assessment And Plan:  Type 2 diabetes mellitus with stage 3a chronic kidney disease, without long-term current use of insulin (HCC) Assessment & Plan: Chronic.  She will continue with [place] 10mg  daily and Ozempic 0.5mg  weekly.  She is thankful for patient assistance.  She will f/u in four months for re-evaluation.    Orders: -     CBC -     CMP14+EGFR -     Lipid panel -     Hemoglobin A1c  Parenchymal renal hypertension, stage 1 through stage 4 or unspecified chronic kidney disease Assessment & Plan: Chronic, controlled. Optimal BP<120/80.  She will continue with  olmesartan 20mg  daily. Encouraged to follow low sodium diet. She will f/u in four to six months for re-evaluation.   Orders: -     CMP14+EGFR  Pure hypercholesterolemia Assessment & Plan: Chronic, LDL goal is less than 70.  She will continue with pitavastatin 4mg  daily. Encouraged to follow a heart healthy lifestyle.   Orders: -     CMP14+EGFR -     Lipid panel  Class 1 obesity due to excess calories with serious comorbidity and body mass index (  BMI) of 31.0 to 31.9 in adult Assessment & Plan: She is encouraged to strive for BMI less than 30 to decrease cardiac risk. Advised to aim for at least 150 minutes of exercise per week.    Other orders -     Loratadine; Take 1 tablet (10 mg total) by mouth daily.  Dispense: 90 tablet; Refill: 1 -     Olmesartan Medoxomil; Take 1 tablet (20 mg total) by mouth daily.  Dispense: 100 tablet; Refill: 2  She is encouraged to strive for BMI less than 30 to decrease cardiac risk. Advised to aim for at least 150 minutes of exercise per week.   Return for 4 month dm f/u. Marland Kitchen  Patient was given opportunity to ask questions. Patient verbalized understanding of the plan and was able to repeat key elements of the plan. All questions were answered to their satisfaction.   I, Nicole Aliment, MD, have reviewed all documentation for this visit. The documentation on 07/25/23 for the exam, diagnosis, procedures, and orders are all accurate and complete.   IF YOU HAVE BEEN REFERRED TO A SPECIALIST, IT MAY TAKE 1-2 WEEKS TO SCHEDULE/PROCESS THE REFERRAL. IF YOU HAVE NOT HEARD FROM US/SPECIALIST IN TWO WEEKS, PLEASE GIVE [place] A CALL AT [phone].   THE PATIENT IS ENCOURAGED TO PRACTICE SOCIAL DISTANCING DUE TO THE COVID-19 PANDEMIC.

## 2023-07-25 NOTE — Assessment & Plan Note (Signed)
 She is encouraged to strive for BMI less than 30 to decrease cardiac risk. Advised to aim for at least 150 minutes of exercise per week.

## 2023-07-26 LAB — CBC
Hematocrit: 37.6 % (ref 34.0–46.6)
Hemoglobin: 11.8 g/dL (ref 11.1–15.9)
MCH: 27.9 pg (ref 26.6–33.0)
MCHC: 31.4 g/dL — ABNORMAL LOW (ref 31.5–35.7)
MCV: 89 fL (ref 79–97)
Platelets: 314 10*3/uL (ref 150–450)
RBC: 4.23 x10E6/uL (ref 3.77–5.28)
RDW: 13.5 % (ref 11.7–15.4)
WBC: 3.9 10*3/uL (ref 3.4–10.8)

## 2023-07-26 LAB — CMP14+EGFR
ALT: 15 IU/L (ref 0–32)
AST: 24 IU/L (ref 0–40)
Albumin: 4.2 g/dL (ref 3.8–4.8)
Alkaline Phosphatase: 103 IU/L (ref 44–121)
BUN/Creatinine Ratio: 13 (ref 12–28)
BUN: 16 mg/dL (ref 8–27)
Bilirubin Total: 0.4 mg/dL (ref 0.0–1.2)
CO2: 24 mmol/L (ref 20–29)
Calcium: 9.6 mg/dL (ref 8.7–10.3)
Chloride: 104 mmol/L (ref 96–106)
Creatinine, Ser: 1.25 mg/dL — ABNORMAL HIGH (ref 0.57–1.00)
Globulin, Total: 2.5 g/dL (ref 1.5–4.5)
Glucose: 112 mg/dL — ABNORMAL HIGH (ref 70–99)
Potassium: 4.8 mmol/L (ref 3.5–5.2)
Sodium: 143 mmol/L (ref 134–144)
Total Protein: 6.7 g/dL (ref 6.0–8.5)
eGFR: 45 mL/min/{1.73_m2} — ABNORMAL LOW (ref >59)

## 2023-07-26 LAB — HEMOGLOBIN A1C
Est. average glucose Bld gHb Est-mCnc: 163 mg/dL
Hgb A1c MFr Bld: 7.3 % — ABNORMAL HIGH (ref 4.8–5.6)

## 2023-07-26 LAB — LIPID PANEL
Chol/HDL Ratio: 2.5 ratio (ref 0.0–4.4)
Cholesterol, Total: 142 mg/dL (ref 100–199)
HDL: 56 mg/dL (ref >39)
LDL Chol Calc (NIH): 74 mg/dL (ref 0–99)
Triglycerides: 54 mg/dL (ref 0–149)
VLDL Cholesterol Cal: 12 mg/dL (ref 5–40)

## 2023-07-31 ENCOUNTER — Ambulatory Visit: Payer: Medicare Other | Attending: Surgery

## 2023-07-31 VITALS — Wt 149.0 lb

## 2023-07-31 DIAGNOSIS — Z483 Aftercare following surgery for neoplasm: Secondary | ICD-10-CM | POA: Insufficient documentation

## 2023-07-31 NOTE — Therapy (Signed)
 OUTPATIENT PHYSICAL THERAPY SOZO SCREENING NOTE   Patient Name: Nicole Nunez MRN: [id number] DOB:[birth year], 78 y.o., female Today's Date: 07/31/2023  PCP: Dorothyann Peng, MD REFERRING PROVIDER: Harriette Bouillon, MD   PT End of Session - 07/31/23 [phone]     Visit Number 1   # unchanged due to screen only   PT Start Time 0915    PT Stop Time 0920    PT Time Calculation (min) 5 min    Activity Tolerance Patient tolerated treatment well    Behavior During Therapy [facility] for tasks assessed/performed             Past Medical History:  Diagnosis Date   Arthritis    Breast cancer (HCC) 05/18/2022   CKD stage 3 due to type 2 diabetes mellitus (HCC)    DM (diabetes mellitus) (HCC)    GERD (gastroesophageal reflux disease)    High cholesterol    HTN (hypertension)    Past Surgical History:  Procedure Laterality Date   BREAST BIOPSY Left 05/18/2022   [place] LT BREAST BX W LOC DEV 1ST LESION IMG BX SPEC US GUIDE 05/18/2022 [facility]   BREAST BIOPSY Left 06/20/2022   [place] LT RADIOACTIVE SEED LOC 06/20/2022 [facility]MO[facility]   BREAST LUMPECTOMY WITH RADIOACTIVE SEED AND AXILLARY LYMPH NODE DISSECTION Left 06/21/2022   Procedure: LEFT BREAST LUMPECTOMY WITH RADIOACTIVE SEED AND AXILLARY NODE DISSECTION;  Surgeon: Harriette Bouillon, MD;  Location: [facility];  Service: General;  Laterality: Left;   CATARACT EXTRACTION, BILATERAL  01/2018   first was 12/2017   corn removal Bilateral 06/2017   EYE SURGERY     PORT-A-CATH REMOVAL N/A 11/01/2022   Procedure: REMOVAL PORT-A-CATH;  Surgeon: Harriette Bouillon, MD;  Location:  SURGERY CENTER;  Service: General;  Laterality: N/A;   PORTACATH PLACEMENT N/A 07/21/2022   Procedure: INSERTION PORT-A-CATH;  Surgeon: Harriette Bouillon, MD;  Location: [facility] OR;  Service: General;  Laterality: N/A;  90 MIN ROOM 4-OK'D PER KELLY   SENTINEL NODE BIOPSY Left 07/21/2022   Procedure: LEFT SENTINEL NODE BIOPSY;  Surgeon: Harriette Bouillon,  MD;  Location: [facility] OR;  Service: General;  Laterality: Left;   Patient Active Problem List   Diagnosis Date Noted   Encounter for general adult medical examination w/o abnormal findings 03/22/2023   Mild nonproliferative diabetic retinopathy of both eyes associated with type 2 diabetes mellitus (HCC) 03/22/2023   Port-A-Cath in place 08/16/2022   Malignant neoplasm of lower-outer quadrant of left breast of female, estrogen receptor positive (HCC) 05/31/2022   Statin myopathy 02/04/2021   Notalgia 01/29/2020   Vitamin D deficiency disease 01/29/2020   Class 1 obesity due to excess calories with serious comorbidity and body mass index (BMI) of 30.0 to 30.9 in adult 01/29/2020   Type 2 diabetes mellitus with stage 3 chronic kidney disease, without long-term current use of insulin (HCC) 05/24/2018   Chronic renal disease, stage III (HCC) 05/24/2018   Parenchymal renal hypertension 05/24/2018   Pure hypercholesterolemia 05/24/2018   Class 1 obesity due to excess calories with serious comorbidity and body mass index (BMI) of 31.0 to 31.9 in adult 05/24/2018    REFERRING DIAG: left breast cancer at risk for lymphedema  THERAPY DIAG: Aftercare following surgery for neoplasm  PERTINENT HISTORY: Patient was diagnosed left IDC. It measures 9mm and is located in the upper outer quadrant. It is ER/PR positive with a Ki67 of 50%.  lumpectomy and SLNB on 06/21/22 but with no lymph nodes revealed in tissue.  Will be having chemotherapy TC followed by radiation as well as sentinel lymph node mapping.   PRECAUTIONS: left UE Lymphedema risk, None  SUBJECTIVE: Pt returns for her 3 month L-Dex screen.   PAIN:  Are you having pain? No  SOZO SCREENING: Patient was assessed today using the SOZO machine to determine the lymphedema index score. This was compared to her baseline score. It was determined that she is within the recommended range when compared to her baseline and no further action is needed at this  time. She will continue SOZO screenings. These are done every 3 months for 2 years post operatively followed by every 6 months for 2 years, and then annually.   L-DEX FLOWSHEETS - 07/31/23 0900       L-DEX LYMPHEDEMA SCREENING   Measurement Type Unilateral    L-DEX MEASUREMENT EXTREMITY Upper Extremity    POSITION  Standing    DOMINANT SIDE Right    At Risk Side Left    BASELINE SCORE (UNILATERAL) 1.2    L-DEX SCORE (UNILATERAL) 2.2    VALUE CHANGE (UNILAT) 1               Hermenia Bers, PTA 07/31/2023, 9:19 AM

## 2023-08-02 DIAGNOSIS — E118 Type 2 diabetes mellitus with unspecified complications: Secondary | ICD-10-CM | POA: Diagnosis not present

## 2023-08-02 DIAGNOSIS — B351 Tinea unguium: Secondary | ICD-10-CM | POA: Diagnosis not present

## 2023-08-02 DIAGNOSIS — L84 Corns and callosities: Secondary | ICD-10-CM | POA: Diagnosis not present

## 2023-08-02 DIAGNOSIS — M79675 Pain in left toe(s): Secondary | ICD-10-CM | POA: Diagnosis not present

## 2023-08-08 ENCOUNTER — Other Ambulatory Visit: Payer: Self-pay | Admitting: Internal Medicine

## 2023-08-21 ENCOUNTER — Other Ambulatory Visit: Payer: Self-pay | Admitting: Internal Medicine

## 2023-08-28 ENCOUNTER — Ambulatory Visit: Payer: Medicare Other | Admitting: Hematology and Oncology

## 2023-09-04 LAB — SIGNATERA
SIGNATERA MTM READOUT: 1.83 MTM/ml — AB
SIGNATERA TEST RESULT: POSITIVE — AB

## 2023-09-05 ENCOUNTER — Other Ambulatory Visit: Payer: Self-pay | Admitting: *Deleted

## 2023-09-05 DIAGNOSIS — C50512 Malignant neoplasm of lower-outer quadrant of left female breast: Secondary | ICD-10-CM

## 2023-09-05 NOTE — Progress Notes (Signed)
 Received positive guardant reveal results.  Verbal orders received from MD to move up CT CAP and bone scan to be completed in the next 2 weeks.  Orders updated, pt notified and transferred to scheduling.

## 2023-09-20 ENCOUNTER — Ambulatory Visit (HOSPITAL_COMMUNITY)
Admission: RE | Admit: 2023-09-20 | Discharge: 2023-09-20 | Disposition: A | Discharge status: Home / Self Care | Source: Ambulatory Visit | Attending: Hematology and Oncology | Admitting: Hematology and Oncology

## 2023-09-20 ENCOUNTER — Encounter (HOSPITAL_COMMUNITY)
Admission: RE | Admit: 2023-09-20 | Discharge: 2023-09-20 | Disposition: A | Discharge status: Home / Self Care | Source: Ambulatory Visit | Attending: Hematology and Oncology | Admitting: Hematology and Oncology

## 2023-09-20 DIAGNOSIS — Z170 Estrogen receptor positive status [ER+]: Secondary | ICD-10-CM | POA: Insufficient documentation

## 2023-09-20 DIAGNOSIS — C50512 Malignant neoplasm of lower-outer quadrant of left female breast: Secondary | ICD-10-CM | POA: Diagnosis not present

## 2023-09-20 DIAGNOSIS — C50919 Malignant neoplasm of unspecified site of unspecified female breast: Secondary | ICD-10-CM | POA: Diagnosis not present

## 2023-09-20 DIAGNOSIS — I700 Atherosclerosis of aorta: Secondary | ICD-10-CM | POA: Diagnosis not present

## 2023-09-20 MED ORDER — SODIUM CHLORIDE (PF) 0.9 % IJ SOLN
INTRAMUSCULAR | Status: AC
  Filled 2023-09-20: qty 50

## 2023-09-20 MED ORDER — IOHEXOL 300 MG/ML  SOLN
80.0000 mL | Freq: Once | INTRAMUSCULAR | Status: AC | PRN
  Administered 2023-09-20: 80 mL via INTRAVENOUS

## 2023-09-20 MED ORDER — TECHNETIUM TC 99M MEDRONATE IV KIT
21.8000 | PACK | Freq: Once | INTRAVENOUS | Status: AC
  Administered 2023-09-20: 21.8 via INTRAVENOUS

## 2023-09-26 ENCOUNTER — Inpatient Hospital Stay: Attending: Hematology and Oncology | Admitting: Hematology and Oncology

## 2023-09-26 ENCOUNTER — Other Ambulatory Visit: Payer: Self-pay | Admitting: *Deleted

## 2023-09-26 VITALS — BP 131/72 | HR 80 | Temp 98.4°F | Resp 16 | Ht <= 58 in | Wt 149.8 lb

## 2023-09-26 DIAGNOSIS — C50512 Malignant neoplasm of lower-outer quadrant of left female breast: Secondary | ICD-10-CM | POA: Diagnosis not present

## 2023-09-26 DIAGNOSIS — Z170 Estrogen receptor positive status [ER+]: Secondary | ICD-10-CM

## 2023-09-26 DIAGNOSIS — M8580 Other specified disorders of bone density and structure, unspecified site: Secondary | ICD-10-CM | POA: Insufficient documentation

## 2023-09-26 MED ORDER — ANASTROZOLE 1 MG PO TABS: 1.0000 mg | ORAL_TABLET | Freq: Every day | ORAL | 3 refills | Status: DC

## 2023-09-26 NOTE — Progress Notes (Signed)
 Patient Care Team: Cleave Curling, MD as PCP - General (Internal Medicine) Cameron Cea, MD as Consulting Physician (Hematology and Oncology) Sim Dryer, MD as Consulting Physician (General Surgery) Johna Myers, MD as Consulting Physician (Radiation Oncology)  DIAGNOSIS:  Encounter Diagnosis  Name Primary?   Malignant neoplasm of lower-outer quadrant of left breast of female, estrogen receptor positive (HCC) Yes    SUMMARY OF ONCOLOGIC HISTORY: Oncology History  Malignant neoplasm of lower-outer quadrant of left breast of female, estrogen receptor positive (HCC)  05/18/2022 Initial Diagnosis   05/18/2022: Screening mammogram detected left breast mass by ultrasound it measured 9 mm, biopsy revealed grade 2 IDC ER 90%, PR 15%, Ki-67 50%, HER2 2+ by IHC, FISH negative ratio 2.17, Copy #3.9, additional 20 cells were examined ratio came at 2.19 and copy #3.72   06/05/2022 Cancer Staging   Staging form: Breast, AJCC 8th Edition - Clinical: Stage IA (cT1b, cN0, cM0, G2, ER+, PR+, HER2-) - Signed by Bettejane Brownie, PA-C on 06/05/2022 Method of lymph node assessment: Clinical Histologic grading system: 3 grade system   06/21/2022 Surgery   Left lumpectomy: Grade 3 IDC 1.5 cm, margins negative, no lymph node (adipose tissue was in the sentinel node) ER 90% weak to moderate, PR 15% weak to strong, HER2 negative, Ki-67 50% Repeat prognostic panel: ER 0%, PR 0%, Ki-67 35%, HER2 2+ by IHC FISH negative ratio 1.72 (triple negative breast cancer)   08/09/2022 - 09/22/2022 Chemotherapy   Patient is on Treatment Plan : BREAST TC q21d     10/10/2022 Cancer Staging   Staging form: Breast, AJCC 8th Edition - Pathologic stage from 10/10/2022: Stage IB (pT1c, pN0, cM0, G3, ER-, PR-, HER2-) - Signed by Bettejane Brownie, PA-C on 10/10/2022 Histologic grading system: 3 grade system   10/27/2022 - 11/23/2022 Radiation Therapy   Plan Name: Breast_L_BH Site: Breast, Left Technique: 3D Mode:  Photon Dose Per Fraction: 2.66 Gy Prescribed Dose (Delivered / Prescribed): 42.56 Gy / 42.56 Gy Prescribed Fxs (Delivered / Prescribed): 16 / 16   Plan Name: Brst_L_Bst_BH Site: Breast, Left Technique: 3D Mode: Photon Dose Per Fraction: 2 Gy Prescribed Dose (Delivered / Prescribed): 8 Gy / 8 Gy Prescribed Fxs (Delivered / Prescribed): 4 / 4     CHIEF COMPLIANT: Follow-up to discuss rising Signatera test results and to review scans  HISTORY OF PRESENT ILLNESS: History of Present Illness Nicole Nunez is a 78 year old female with breast cancer who presents for follow-up regarding rising cancer DNA levels in her bloodstream.  Her breast cancer was initially diagnosed as estrogen receptor positive but later found to be estrogen receptor negative post-surgery. She has not received antiestrogen therapy. Recent Signatera tests indicate rising cancer DNA levels, from 0.02 to 2.71 to 1.8, without visible evidence on CT or bone scans.  She experiences nocturnal hot flashes but denies pain or other unusual symptoms. She has osteopenia, managed with calcium  and vitamin D  supplements, and maintains bone health through walking.  She experiences stress related to family issues, particularly with her oldest son, and inquires about its impact on her blood readings.  She is taking turmeric and considering soursop and has used [place] tail mushrooms as part of her regimen.     ALLERGIES:  is allergic to statins.  MEDICATIONS:  Current Outpatient Medications  Medication Sig Dispense Refill   anastrozole (ARIMIDEX) 1 MG tablet Take 1 tablet (1 mg total) by mouth daily. 90 tablet 3   Ascorbic Acid (VITAMIN C) 1000 MG tablet  Take 1,000 mg by mouth daily.     aspirin EC 81 MG tablet Take 81 mg by mouth daily.     b complex vitamins tablet Take 1 tablet by mouth daily.     Calcium  Carbonate (CALCIUM  600 PO) Take 600 mg by mouth daily.     Cholecalciferol (VITAMIN D3) 125 MCG (5000 UT) CAPS Take  5,000 Units by mouth daily.     dapagliflozin  propanediol (FARXIGA ) 10 MG TABS tablet Take 1 tablet (10 mg total) by mouth daily. 90 tablet 2   Lancet Devices (ONE TOUCH DELICA LANCING DEV) MISC USE AS DIRECTED TO CHECK BLOOD SUGARS ONCE DAILY dx: e11.22 1 each 2   loratadine  (CLARITIN ) 10 MG tablet Take 1 tablet (10 mg total) by mouth daily. 90 tablet 1   magnesium  gluconate (MAGONATE) 500 MG tablet Take 500 mg by mouth daily.     Multiple Vitamin (MULTIVITAMIN) tablet Take 1 tablet by mouth daily.     olmesartan  (BENICAR ) 20 MG tablet Take 1 tablet (20 mg total) by mouth daily. 100 tablet 2   OneTouch Delica Lancets 33G MISC Use daily with one touch meter 100 each 2   ONETOUCH VERIO test strip USE TO CHECK BLOOD SUGAR DAILY 100 strip 2   pantoprazole  (PROTONIX ) 40 MG tablet TAKE 1 TABLET BY MOUTH DAILY 100 tablet 2   Pitavastatin  Magnesium  4 MG TABS Take 1 tablet (4 mg total) by mouth daily. 90 tablet 2   Semaglutide ,0.25 or 0.5MG /DOS, (OZEMPIC , 0.25 OR 0.5 MG/DOSE,) 2 MG/3ML SOPN INJECT 0.5MG  SUBCUTANEOUSLY WEEKLY. 3 mL 1   zinc gluconate 50 MG tablet Take 50 mg by mouth daily.     No current facility-administered medications for this visit.    PHYSICAL EXAMINATION: ECOG PERFORMANCE STATUS: 1 - Symptomatic but completely ambulatory  Vitals:   09/26/23 1039  BP: 131/72  Pulse: 80  Resp: 16  Temp: 98.4 F (36.9 C)  SpO2: 100%   Filed Weights   09/26/23 1039  Weight: 149 lb 12.8 oz (67.9 kg)      LABORATORY DATA:  I have reviewed the data as listed    Latest Ref Rng & Units 07/25/2023   10:07 AM 03/15/2023    4:07 PM 10/25/2022   12:13 PM  CMP  Glucose 70 - 99 mg/dL 657  88  846   BUN 8 - 27 mg/dL 16  20  16    Creatinine 0.57 - 1.00 mg/dL 9.62  9.52  8.41   Sodium 134 - 144 mmol/L 143  139  138   Potassium 3.5 - 5.2 mmol/L 4.8  4.0  4.2   Chloride 96 - 106 mmol/L 104  100  107   CO2 20 - 29 mmol/L 24  23  26    Calcium  8.7 - 10.3 mg/dL 9.6  9.3  8.7   Total Protein 6.0 -  8.5 g/dL 6.7  6.6    Total Bilirubin 0.0 - 1.2 mg/dL 0.4  0.9    Alkaline Phos 44 - 121 IU/L 103  112    AST 0 - 40 IU/L 24  28    ALT 0 - 32 IU/L 15  20      Lab Results  Component Value Date   WBC 3.9 07/25/2023   HGB 11.8 07/25/2023   HCT 37.6 07/25/2023   MCV 89 07/25/2023   PLT 314 07/25/2023   NEUTROABS 5.4 09/20/2022    ASSESSMENT & PLAN:  Malignant neoplasm of lower-outer quadrant of left breast of female,  estrogen receptor positive (HCC) 05/18/2022: Screening mammogram detected left breast mass by ultrasound it measured 9 mm, biopsy revealed grade 2 IDC ER 90%, PR 15%, Ki-67 50%, HER2 2+ by IHC, FISH negative ratio 2.17, Copy #3.9, additional 20 cells were examined ratio came at 2.19 and copy #3.72    06/21/2022: Left lumpectomy: Grade 3 IDC 1.5 cm, margins negative, no lymph node (adipose tissue was in the sentinel node) ER 90% weak to moderate, PR 15% weak to strong, HER2 negative, Ki-67 50% Repeat prognostic panel: ER 0%, PR 0%, Ki-67 35%, HER2 2+ by IHC FISH negative ratio 1.72 (triple negative breast cancer)   Treatment plan: Oncotype DX testing was canceled because the cancer was triple negative.  Recommend adjuvant chemotherapy with Taxotere  and Cytoxan  every 3 weeks x 3 completed 09/20/2022 Adjuvant radiation therapy completed 11/23/2022 -----------------------------------------------------------------------------------------------------------------------------------  Current treatment: Surveillance Signatera testing:  03/15/2023: +0.02 08/28/2023: +1.83  CT CAP 09/25/2023: Few tiny lung nodules 5 mm in size unchanged.  No evidence of metastatic disease. Bone scan 09/20/2023: No evidence of metastatic disease. Mammogram 05/25/2023: Benign breast density category B    Recommendation:  Continue Signatera testing and monitoring.  Every 3 months  Consider starting her on antiestrogen therapy (although the final pathology was ER negative, original pathology had ER  positivity) I sent a prescription for anastrozole today.  Telephone call in 3 months to assess tolerance to anastrozole. CT CAP in 6 months. ------------------------------------- Assessment and Plan Assessment & Plan Rising circulating tumor DNA (ctDNA) levels Rising ctDNA levels suggest possible residual estrogen receptor-positive cancer cells. Anastrozole proposed to reduce estrogen production and preempt recurrence. Risks include heart flutter, joint stiffness, and osteoporosis. Benefits include reducing estrogen production and potentially preventing recurrence. - Start anastrozole 1 mg daily. - Monitor for side effects such as heart flutter and joint stiffness. - Continue calcium  and vitamin D  supplementation to mitigate osteoporosis risk. - Monitor ctDNA levels every three months. - Schedule follow-up call in three months and in-person visit in six months. - Perform CT scan every six months.  Osteopenia Osteopenia noted on bone density scan from August 2024. She is taking calcium  and vitamin D  supplements and engages in walking for bone health. - Repeat bone density scan in 2026. - Continue calcium  and vitamin D  supplementation. - Encourage regular walking for bone health.      Orders Placed This Encounter  Procedures   CT CHEST ABDOMEN PELVIS W CONTRAST    Standing Status:   Future    Expected Date:   03/28/2024    Expiration Date:   09/25/2024    If indicated for the ordered procedure, I authorize the administration of contrast media per Radiology protocol:   Yes    Does the patient have a contrast media/X-ray dye allergy?:   No    Preferred imaging location?:   [facility]    Release to patient:   Immediate    If indicated for the ordered procedure, I authorize the administration of oral contrast media per Radiology protocol:   Yes   The patient has a good understanding of the overall plan. she agrees with it. she will call with any problems that may develop  before the next visit here. Total time spent: 30 mins including face to face time and time spent for planning, charting and co-ordination of care   Margert Sheerer, MD 09/26/23

## 2023-09-26 NOTE — Progress Notes (Signed)
 Renewal orders placed for Signatera every 3 months per MD request.

## 2023-09-26 NOTE — Assessment & Plan Note (Signed)
 05/18/2022: Screening mammogram detected left breast mass by ultrasound it measured 9 mm, biopsy revealed grade 2 IDC ER 90%, PR 15%, Ki-67 50%, HER2 2+ by IHC, FISH negative ratio 2.17, Copy #3.9, additional 20 cells were examined ratio came at 2.19 and copy #3.72    06/21/2022: Left lumpectomy: Grade 3 IDC 1.5 cm, margins negative, no lymph node (adipose tissue was in the sentinel node) ER 90% weak to moderate, PR 15% weak to strong, HER2 negative, Ki-67 50% Repeat prognostic panel: ER 0%, PR 0%, Ki-67 35%, HER2 2+ by IHC FISH negative ratio 1.72 (triple negative breast cancer)   Treatment plan: Oncotype DX testing was canceled because the cancer was triple negative.  Recommend adjuvant chemotherapy with Taxotere  and Cytoxan  every 3 weeks x 3 completed 09/20/2022 Adjuvant radiation therapy completed 11/23/2022 -----------------------------------------------------------------------------------------------------------------------------------  Current treatment: Surveillance Signatera testing:  03/15/2023: +0.02 08/28/2023: +1.83  CT CAP 09/25/2023: Few tiny lung nodules 5 mm in size unchanged.  No evidence of metastatic disease. Bone scan: No evidence of metastatic disease. Mammogram 05/25/2023: Benign breast density category B    Recommendation:  Continue Signatera testing and monitoring. I recommend guardant 360 Consider starting her on antiestrogen therapy (although the final pathology was ER negative, original pathology had ER positivity)

## 2023-10-13 ENCOUNTER — Other Ambulatory Visit: Payer: Self-pay | Admitting: Internal Medicine

## 2023-10-26 ENCOUNTER — Inpatient Hospital Stay: Payer: Medicare Other | Attending: Hematology and Oncology | Admitting: Hematology and Oncology

## 2023-10-26 VITALS — BP 140/63 | HR 84 | Temp 98.5°F | Resp 17 | Ht <= 58 in | Wt 149.6 lb

## 2023-10-26 DIAGNOSIS — Z923 Personal history of irradiation: Secondary | ICD-10-CM | POA: Insufficient documentation

## 2023-10-26 DIAGNOSIS — Z9221 Personal history of antineoplastic chemotherapy: Secondary | ICD-10-CM | POA: Insufficient documentation

## 2023-10-26 DIAGNOSIS — Z170 Estrogen receptor positive status [ER+]: Secondary | ICD-10-CM | POA: Insufficient documentation

## 2023-10-26 DIAGNOSIS — Z79811 Long term (current) use of aromatase inhibitors: Secondary | ICD-10-CM | POA: Insufficient documentation

## 2023-10-26 DIAGNOSIS — C50512 Malignant neoplasm of lower-outer quadrant of left female breast: Secondary | ICD-10-CM | POA: Diagnosis not present

## 2023-10-26 DIAGNOSIS — Z7984 Long term (current) use of oral hypoglycemic drugs: Secondary | ICD-10-CM | POA: Insufficient documentation

## 2023-10-26 DIAGNOSIS — Z7982 Long term (current) use of aspirin: Secondary | ICD-10-CM | POA: Insufficient documentation

## 2023-10-26 DIAGNOSIS — Z79899 Other long term (current) drug therapy: Secondary | ICD-10-CM | POA: Insufficient documentation

## 2023-10-26 NOTE — Progress Notes (Signed)
 Patient Care Team: Nicole Curling, MD as PCP - General (Internal Medicine) Nicole Cea, MD as Consulting Physician (Hematology and Oncology) Nicole Dryer, MD as Consulting Physician (General Surgery) Nicole Myers, MD as Consulting Physician (Radiation Oncology)  DIAGNOSIS:  Encounter Diagnosis  Name Primary?   Malignant neoplasm of lower-outer quadrant of left breast of female, estrogen receptor positive (HCC) Yes    SUMMARY OF ONCOLOGIC HISTORY: Oncology History  Malignant neoplasm of lower-outer quadrant of left breast of female, estrogen receptor positive (HCC)  05/18/2022 Initial Diagnosis   05/18/2022: Screening mammogram detected left breast mass by ultrasound it measured 9 mm, biopsy revealed grade 2 IDC ER 90%, PR 15%, Ki-67 50%, HER2 2+ by IHC, FISH negative ratio 2.17, Copy #3.9, additional 20 cells were examined ratio came at 2.19 and copy #3.72   06/05/2022 Cancer Staging   Staging form: Breast, AJCC 8th Edition - Clinical: Stage IA (cT1b, cN0, cM0, G2, ER+, PR+, HER2-) - Signed by Bettejane Brownie, PA-C on 06/05/2022 Method of lymph node assessment: Clinical Histologic grading system: 3 grade system   06/21/2022 Surgery   Left lumpectomy: Grade 3 IDC 1.5 cm, margins negative, no lymph node (adipose tissue was in the sentinel node) ER 90% weak to moderate, PR 15% weak to strong, HER2 negative, Ki-67 50% Repeat prognostic panel: ER 0%, PR 0%, Ki-67 35%, HER2 2+ by IHC FISH negative ratio 1.72 (triple negative breast cancer)   08/09/2022 - 09/22/2022 Chemotherapy   Patient is on Treatment Plan : BREAST TC q21d     10/10/2022 Cancer Staging   Staging form: Breast, AJCC 8th Edition - Pathologic stage from 10/10/2022: Stage IB (pT1c, pN0, cM0, G3, ER-, PR-, HER2-) - Signed by Bettejane Brownie, PA-C on 10/10/2022 Histologic grading system: 3 grade system   10/27/2022 - 11/23/2022 Radiation Therapy   Plan Name: Breast_L_BH Site: Breast, Left Technique: 3D Mode:  Photon Dose Per Fraction: 2.66 Gy Prescribed Dose (Delivered / Prescribed): 42.56 Gy / 42.56 Gy Prescribed Fxs (Delivered / Prescribed): 16 / 16   Plan Name: Brst_L_Bst_BH Site: Breast, Left Technique: 3D Mode: Photon Dose Per Fraction: 2 Gy Prescribed Dose (Delivered / Prescribed): 8 Gy / 8 Gy Prescribed Fxs (Delivered / Prescribed): 4 / 4     CHIEF COMPLIANT:   HISTORY OF PRESENT ILLNESS: Follow-up to discuss tolerance to anastrozole  therapy  History of Present Illness Nicole Nunez is a 78 year old female with estrogen receptor-positive breast cancer who presents for a follow-up visit.  She is on Anastrozole  and experiences itching and occasional sweats, which are side effects of the medication. She has no new problems with the medication and no issues obtaining it.  She is being monitored for rising circulating tumor DNA (ctDNA) levels as part of her breast cancer management. Regular scans are planned to assess the status of the disease.  She inquires about the use of complementary and alternative medicine, specifically black seed oil, turmeric, and soursop, which she is currently using.     ALLERGIES:  is allergic to statins.  MEDICATIONS:  Current Outpatient Medications  Medication Sig Dispense Refill   anastrozole  (ARIMIDEX ) 1 MG tablet Take 1 tablet (1 mg total) by mouth daily. 90 tablet 3   Ascorbic Acid (VITAMIN C) 1000 MG tablet Take 1,000 mg by mouth daily.     aspirin EC 81 MG tablet Take 81 mg by mouth daily.     b complex vitamins tablet Take 1 tablet by mouth daily.     Calcium   Carbonate (CALCIUM  600 PO) Take 600 mg by mouth daily.     Cholecalciferol (VITAMIN D3) 125 MCG (5000 UT) CAPS Take 5,000 Units by mouth daily.     dapagliflozin  propanediol (FARXIGA ) 10 MG TABS tablet Take 1 tablet (10 mg total) by mouth daily. 90 tablet 2   Lancet Devices (ONE TOUCH DELICA LANCING DEV) MISC USE AS DIRECTED TO CHECK BLOOD SUGARS ONCE DAILY dx: e11.22 1 each 2    Lancets (ONETOUCH DELICA PLUS LANCET33G) MISC USE DAILY AS DIRECTED 100 each 2   loratadine  (CLARITIN ) 10 MG tablet Take 1 tablet (10 mg total) by mouth daily. 90 tablet 1   magnesium  gluconate (MAGONATE) 500 MG tablet Take 500 mg by mouth daily.     Multiple Vitamin (MULTIVITAMIN) tablet Take 1 tablet by mouth daily.     olmesartan  (BENICAR ) 20 MG tablet Take 1 tablet (20 mg total) by mouth daily. 100 tablet 2   ONETOUCH VERIO test strip USE TO CHECK BLOOD SUGAR DAILY 100 strip 2   pantoprazole  (PROTONIX ) 40 MG tablet TAKE 1 TABLET BY MOUTH DAILY 100 tablet 2   Pitavastatin  Magnesium  4 MG TABS Take 1 tablet (4 mg total) by mouth daily. 90 tablet 2   Semaglutide ,0.25 or 0.5MG /DOS, (OZEMPIC , 0.25 OR 0.5 MG/DOSE,) 2 MG/3ML SOPN INJECT 0.5MG  SUBCUTANEOUSLY WEEKLY. 3 mL 1   zinc gluconate 50 MG tablet Take 50 mg by mouth daily.     No current facility-administered medications for this visit.    PHYSICAL EXAMINATION: ECOG PERFORMANCE STATUS: 1 - Symptomatic but completely ambulatory  Vitals:   10/26/23 0852  BP: (!) 140/63  Pulse: 84  Resp: 17  Temp: 98.5 F (36.9 C)  SpO2: 100%   Filed Weights   10/26/23 0852  Weight: 149 lb 9.6 oz (67.9 kg)     LABORATORY DATA:  I have reviewed the data as listed    Latest Ref Rng & Units 07/25/2023   10:07 AM 03/15/2023    4:07 PM 10/25/2022   12:13 PM  CMP  Glucose 70 - 99 mg/dL 875  88  643   BUN 8 - 27 mg/dL 16  20  16    Creatinine 0.57 - 1.00 mg/dL 3.29  5.18  8.41   Sodium 134 - 144 mmol/L 143  139  138   Potassium 3.5 - 5.2 mmol/L 4.8  4.0  4.2   Chloride 96 - 106 mmol/L 104  100  107   CO2 20 - 29 mmol/L 24  23  26    Calcium  8.7 - 10.3 mg/dL 9.6  9.3  8.7   Total Protein 6.0 - 8.5 g/dL 6.7  6.6    Total Bilirubin 0.0 - 1.2 mg/dL 0.4  0.9    Alkaline Phos 44 - 121 IU/L 103  112    AST 0 - 40 IU/L 24  28    ALT 0 - 32 IU/L 15  20      Lab Results  Component Value Date   WBC 3.9 07/25/2023   HGB 11.8 07/25/2023   HCT 37.6  07/25/2023   MCV 89 07/25/2023   PLT 314 07/25/2023   NEUTROABS 5.4 09/20/2022    ASSESSMENT & PLAN:  Malignant neoplasm of lower-outer quadrant of left breast of female, estrogen receptor positive (HCC) 05/18/2022: Screening mammogram detected left breast mass by ultrasound it measured 9 mm, biopsy revealed grade 2 IDC ER 90%, PR 15%, Ki-67 50%, HER2 2+ by IHC, FISH negative ratio 2.17, Copy #3.9, additional 20 cells  were examined ratio came at 2.19 and copy #3.72    06/21/2022: Left lumpectomy: Grade 3 IDC 1.5 cm, margins negative, no lymph node (adipose tissue was in the sentinel node) ER 90% weak to moderate, PR 15% weak to strong, HER2 negative, Ki-67 50% Repeat prognostic panel: ER 0%, PR 0%, Ki-67 35%, HER2 2+ by IHC FISH negative ratio 1.72 (triple negative breast cancer)   Treatment plan: Oncotype DX testing was canceled because the cancer was triple negative.  Recommend adjuvant chemotherapy with Taxotere  and Cytoxan  every 3 weeks x 3 completed 09/20/2022 Adjuvant radiation therapy completed 11/23/2022 -----------------------------------------------------------------------------------------------------------------------------------  Current treatment: Surveillance Signatera testing:  03/15/2023: +0.02 08/28/2023: +1.83   CT CAP 09/25/2023: Few tiny lung nodules 5 mm in size unchanged.  No evidence of metastatic disease. Bone scan 09/20/2023: No evidence of metastatic disease. Mammogram 05/25/2023: Benign breast density category B    Recommendation:  Continue Signatera testing and monitoring.  Every 3 months Current treatment: Anastrozole  started May 2025 Anastrozole  toxicities: Continues to have intermittent hot flashes and joint symptoms which were present even at baseline.  Return to clinic in 6 months after scans ------------------------------------- Assessment and Plan Assessment & Plan Malignant neoplasm of lower-outer quadrant of left breast On Anastrozole  for estrogen  receptor-positive breast cancer. Experienced itching and occasional sweats, known side effects. No medication access issues. Advised against mushrooms. - Continue Anastrozole  1 mg orally daily. - Order scans for November 6th. - Cancel August 6th appointment. - Schedule follow-up on November 13th.      No orders of the defined types were placed in this encounter.  The patient has a good understanding of the overall plan. she agrees with it. she will call with any problems that may develop before the next visit here. Total time spent: 30 mins including face to face time and time spent for planning, charting and co-ordination of care   Viinay K Lauraann Missey, MD 10/26/23

## 2023-10-26 NOTE — Assessment & Plan Note (Signed)
 05/18/2022: Screening mammogram detected left breast mass by ultrasound it measured 9 mm, biopsy revealed grade 2 IDC ER 90%, PR 15%, Ki-67 50%, HER2 2+ by IHC, FISH negative ratio 2.17, Copy #3.9, additional 20 cells were examined ratio came at 2.19 and copy #3.72    06/21/2022: Left lumpectomy: Grade 3 IDC 1.5 cm, margins negative, no lymph node (adipose tissue was in the sentinel node) ER 90% weak to moderate, PR 15% weak to strong, HER2 negative, Ki-67 50% Repeat prognostic panel: ER 0%, PR 0%, Ki-67 35%, HER2 2+ by IHC FISH negative ratio 1.72 (triple negative breast cancer)   Treatment plan: Oncotype DX testing was canceled because the cancer was triple negative.  Recommend adjuvant chemotherapy with Taxotere  and Cytoxan  every 3 weeks x 3 completed 09/20/2022 Adjuvant radiation therapy completed 11/23/2022 -----------------------------------------------------------------------------------------------------------------------------------  Current treatment: Surveillance Signatera testing:  03/15/2023: +0.02 08/28/2023: +1.83   CT CAP 09/25/2023: Few tiny lung nodules 5 mm in size unchanged.  No evidence of metastatic disease. Bone scan 09/20/2023: No evidence of metastatic disease. Mammogram 05/25/2023: Benign breast density category B    Recommendation:  Continue Signatera testing and monitoring.  Every 3 months Current treatment: Anastrozole  started May 2025 Anastrozole  toxicities:  Return to clinic in 6 months after scans

## 2023-10-30 ENCOUNTER — Ambulatory Visit: Attending: Surgery

## 2023-10-30 VITALS — Wt 148.0 lb

## 2023-10-30 DIAGNOSIS — Z483 Aftercare following surgery for neoplasm: Secondary | ICD-10-CM | POA: Insufficient documentation

## 2023-10-30 NOTE — Therapy (Signed)
 OUTPATIENT PHYSICAL THERAPY SOZO SCREENING NOTE   Patient Name: Nicole Nunez MRN: [id number] DOB:[birth year], 78 y.o., female Today's Date: 10/30/2023  PCP: Cleave Curling, MD REFERRING PROVIDER: Sim Dryer, MD   PT End of Session - 10/30/23 0844     Visit Number 1   # unchanged due to screen only   PT Start Time 0842    PT Stop Time 0847    PT Time Calculation (min) 5 min    Activity Tolerance Patient tolerated treatment well    Behavior During Therapy [facility] for tasks assessed/performed             Past Medical History:  Diagnosis Date   Arthritis    Breast cancer (HCC) 05/18/2022   CKD stage 3 due to type 2 diabetes mellitus (HCC)    DM (diabetes mellitus) (HCC)    GERD (gastroesophageal reflux disease)    High cholesterol    HTN (hypertension)    Past Surgical History:  Procedure Laterality Date   BREAST BIOPSY Left 05/18/2022   US  LT BREAST BX W LOC DEV 1ST LESION IMG BX SPEC US  GUIDE 05/18/2022 [facility]MO[facility]   BREAST BIOPSY Left 06/20/2022   US  LT RADIOACTIVE SEED LOC 06/20/2022 [facility] MAMMOGR[facility]   BREAST LUMPECTOMY WITH RADIOACTIVE SEED AND AXILLARY LYMPH NODE DISSECTION Left 06/21/2022   Procedure: LEFT BREAST LUMPECTOMY WITH RADIOACTIVE SEED AND AXILLARY NODE DISSECTION;  Surgeon: Sim Dryer, MD;  Location: [facility];  Service: General;  Laterality: Left;   CATARACT EXTRACTION, BILATERAL  01/2018   first was 12/2017   corn removal Bilateral 06/2017   EYE SURGERY     PORT-A-CATH REMOVAL N/A 11/01/2022   Procedure: REMOVAL PORT-A-CATH;  Surgeon: Sim Dryer, MD;  Location: [facility];  Service: General;  Laterality: N/A;   PORTACATH PLACEMENT N/A 07/21/2022   Procedure: INSERTION PORT-A-CATH;  Surgeon: Sim Dryer, MD;  Location: [facility] OR;  Service: General;  Laterality: N/A;  90 MIN ROOM 4-OK'D PER KELLY   SENTINEL NODE BIOPSY Left 07/21/2022   Procedure: LEFT SENTINEL NODE BIOPSY;  Surgeon: Sim Dryer,  MD;  Location: [facility] OR;  Service: General;  Laterality: Left;   Patient Active Problem List   Diagnosis Date Noted   Encounter for general adult medical examination w/o abnormal findings 03/22/2023   Mild nonproliferative diabetic retinopathy of both eyes associated with type 2 diabetes mellitus (HCC) 03/22/2023   Port-A-Cath in place 08/16/2022   Malignant neoplasm of lower-outer quadrant of left breast of female, estrogen receptor positive (HCC) 05/31/2022   Statin myopathy 02/04/2021   Notalgia 01/29/2020   Vitamin D  deficiency disease 01/29/2020   Class 1 obesity due to excess calories with serious comorbidity and body mass index (BMI) of 30.0 to 30.9 in adult 01/29/2020   Type 2 diabetes mellitus with stage 3 chronic kidney disease, without long-term current use of insulin (HCC) 05/24/2018   Chronic renal disease, stage III (HCC) 05/24/2018   Parenchymal renal hypertension 05/24/2018   Pure hypercholesterolemia 05/24/2018   Class 1 obesity due to excess calories with serious comorbidity and body mass index (BMI) of 31.0 to 31.9 in adult 05/24/2018    REFERRING DIAG: left breast cancer at risk for lymphedema  THERAPY DIAG: Aftercare following surgery for neoplasm  PERTINENT HISTORY: Patient was diagnosed left IDC. It measures 9mm and is located in the upper outer quadrant. It is ER/PR positive with a Ki67 of 50%.  lumpectomy and SLNB on 06/21/22 but with no lymph nodes revealed in tissue.  Will be having chemotherapy TC followed by radiation as well as sentinel lymph node mapping.   PRECAUTIONS: left UE Lymphedema risk, None  SUBJECTIVE: Pt returns for her 3 month L-Dex screen.   PAIN:  Are you having pain? No  SOZO SCREENING: Patient was assessed today using the SOZO machine to determine the lymphedema index score. This was compared to her baseline score. It was determined that she is within the recommended range when compared to her baseline and no further action is needed at this  time. She will continue SOZO screenings. These are done every 3 months for 2 years post operatively followed by every 6 months for 2 years, and then annually.   L-DEX FLOWSHEETS - 10/30/23 0800       L-DEX LYMPHEDEMA SCREENING   Measurement Type Unilateral    L-DEX MEASUREMENT EXTREMITY Upper Extremity    POSITION  Standing    DOMINANT SIDE Right    At Risk Side Left    BASELINE SCORE (UNILATERAL) 1.2    L-DEX SCORE (UNILATERAL) 6.7    VALUE CHANGE (UNILAT) 5.5               Denyce Flank, PTA 10/30/2023, 8:47 AM

## 2023-11-02 DIAGNOSIS — B351 Tinea unguium: Secondary | ICD-10-CM | POA: Diagnosis not present

## 2023-11-02 DIAGNOSIS — E118 Type 2 diabetes mellitus with unspecified complications: Secondary | ICD-10-CM | POA: Diagnosis not present

## 2023-11-02 DIAGNOSIS — M79675 Pain in left toe(s): Secondary | ICD-10-CM | POA: Diagnosis not present

## 2023-11-02 DIAGNOSIS — L84 Corns and callosities: Secondary | ICD-10-CM | POA: Diagnosis not present

## 2023-11-09 ENCOUNTER — Other Ambulatory Visit: Payer: Self-pay | Admitting: Internal Medicine

## 2023-11-17 DIAGNOSIS — C50512 Malignant neoplasm of lower-outer quadrant of left female breast: Secondary | ICD-10-CM | POA: Diagnosis not present

## 2023-11-17 DIAGNOSIS — Z170 Estrogen receptor positive status [ER+]: Secondary | ICD-10-CM | POA: Diagnosis not present

## 2023-11-23 DIAGNOSIS — H43821 Vitreomacular adhesion, right eye: Secondary | ICD-10-CM | POA: Diagnosis not present

## 2023-11-23 DIAGNOSIS — E113292 Type 2 diabetes mellitus with mild nonproliferative diabetic retinopathy without macular edema, left eye: Secondary | ICD-10-CM | POA: Diagnosis not present

## 2023-11-23 DIAGNOSIS — Z961 Presence of intraocular lens: Secondary | ICD-10-CM | POA: Diagnosis not present

## 2023-11-23 DIAGNOSIS — E113211 Type 2 diabetes mellitus with mild nonproliferative diabetic retinopathy with macular edema, right eye: Secondary | ICD-10-CM | POA: Diagnosis not present

## 2023-11-23 LAB — HM DIABETES EYE EXAM

## 2023-11-25 ENCOUNTER — Other Ambulatory Visit: Payer: Self-pay | Admitting: Internal Medicine

## 2023-11-30 ENCOUNTER — Ambulatory Visit: Admitting: Internal Medicine

## 2023-11-30 ENCOUNTER — Encounter: Payer: Self-pay | Admitting: Internal Medicine

## 2023-11-30 VITALS — BP 126/80 | HR 71 | Temp 98.0°F | Ht <= 58 in | Wt 150.0 lb

## 2023-11-30 DIAGNOSIS — I2584 Coronary atherosclerosis due to calcified coronary lesion: Secondary | ICD-10-CM

## 2023-11-30 DIAGNOSIS — E66811 Obesity, class 1: Secondary | ICD-10-CM | POA: Diagnosis not present

## 2023-11-30 DIAGNOSIS — R002 Palpitations: Secondary | ICD-10-CM

## 2023-11-30 DIAGNOSIS — E7800 Pure hypercholesterolemia, unspecified: Secondary | ICD-10-CM | POA: Diagnosis not present

## 2023-11-30 DIAGNOSIS — E1122 Type 2 diabetes mellitus with diabetic chronic kidney disease: Secondary | ICD-10-CM | POA: Diagnosis not present

## 2023-11-30 DIAGNOSIS — I1310 Hypertensive heart and chronic kidney disease without heart failure, with stage 1 through stage 4 chronic kidney disease, or unspecified chronic kidney disease: Secondary | ICD-10-CM | POA: Diagnosis not present

## 2023-11-30 DIAGNOSIS — K219 Gastro-esophageal reflux disease without esophagitis: Secondary | ICD-10-CM | POA: Diagnosis not present

## 2023-11-30 DIAGNOSIS — Z6831 Body mass index (BMI) 31.0-31.9, adult: Secondary | ICD-10-CM

## 2023-11-30 DIAGNOSIS — N1831 Chronic kidney disease, stage 3a: Secondary | ICD-10-CM

## 2023-11-30 DIAGNOSIS — I2510 Atherosclerotic heart disease of native coronary artery without angina pectoris: Secondary | ICD-10-CM

## 2023-11-30 DIAGNOSIS — C50512 Malignant neoplasm of lower-outer quadrant of left female breast: Secondary | ICD-10-CM

## 2023-11-30 DIAGNOSIS — Z170 Estrogen receptor positive status [ER+]: Secondary | ICD-10-CM

## 2023-11-30 DIAGNOSIS — E6609 Other obesity due to excess calories: Secondary | ICD-10-CM

## 2023-11-30 LAB — SIGNATERA
SIGNATERA MTM READOUT: 1.67 MTM/ml — AB
SIGNATERA TEST RESULT: POSITIVE — AB

## 2023-11-30 NOTE — Progress Notes (Signed)
 I,Victoria T Emmitt, CMA,acting as a Neurosurgeon for Catheryn LOISE Slocumb, MD.,have documented all relevant documentation on the behalf of Catheryn LOISE Slocumb, MD,as directed by  Catheryn LOISE Slocumb, MD while in the presence of Catheryn LOISE Slocumb, MD.  Subjective:  Patient ID: Nicole Nunez , female    DOB: Dec 12, 1945 , 78 y.o.   MRN: [id number]  Chief Complaint  Patient presents with   Diabetes    Patient presents today for diabetes & bp follow up. She reports compliance with medications. Denies headache, chest pain & sob.  She states feeling her heart beats fast at night.  While here today she complains of worsening GERD. She believes the medication does not help.    Hypertension    HPI Discussed the use of AI scribe software for clinical note transcription with the patient, who gave verbal consent to proceed.  History of Present Illness Nicole Nunez is a 78 year old female with diabetes and hypertension who presents for a routine check-up.  She is focused on managing her diabetes and blood pressure. Her morning blood sugar levels are typically around 97 mg/dL, but can range from 879 to 130 mg/dL, especially when eating late or snacking, which occurs almost daily. She finds it challenging to keep her blood sugar below 130 mg/dL since undergoing chemotherapy.  Her current medications include Arimidex , aspirin, B complex, calcium , vitamin D , loratadine  10 mg daily for allergies, olmesartan  20 mg for blood pressure, pantoprazole  40 mg daily for heartburn, Ozempic  0.5 mg for diabetes, and pitavastatin  4 mg daily for cholesterol. Pantoprazole  seems less effective recently, as she experiences heartburn more often.  She had an eye exam last Thursday, who reported no diabetic changes in her eyes. Her vision is 20/20 and 20/25. She is also under the care of Dr. Odean, who has been monitoring her blood work due to some abnormal results. She has undergone a CT scan and MRI, which did not reveal any issues, but  there is a concern about rising circulating tumor levels.  She experiences palpitations at night for the past three to four months. She stays busy but has not been exercising regularly. Her water  intake varies, which might contribute to her symptoms. She had a blood draw two weeks ago, but the results are not yet available.  She has been told she has stage three kidney function and is taking olmesartan  and Farxiga . She avoids medications like Motrin, Advil, Aleve, and ibuprofen.  No chest pain or shortness of breath.   Diabetes She presents for her follow-up diabetic visit. She has type 2 diabetes mellitus. Her disease course has been stable. There are no hypoglycemic associated symptoms. Pertinent negatives for diabetes include no blurred vision, no polydipsia, no polyphagia and no polyuria. There are no hypoglycemic complications. Current diabetic treatment includes oral agent (dual therapy). She is compliant with treatment most of the time. She is following a diabetic diet. She participates in exercise intermittently. An ACE inhibitor/angiotensin II receptor blocker is being taken. Eye exam is current.  Hypertension This is a chronic problem. The current episode started more than 1 year ago. The problem has been gradually improving since onset. The problem is controlled. Pertinent negatives include no blurred vision or palpitations. Hypertensive end-organ damage includes kidney disease.     Past Medical History:  Diagnosis Date   Arthritis    Breast cancer (HCC) 05/18/2022   CKD stage 3 due to type 2 diabetes mellitus (HCC)    DM (diabetes mellitus) (HCC)  GERD (gastroesophageal reflux disease)    High cholesterol    HTN (hypertension)      Family History  Problem Relation Age of Onset   Asthma Mother    Hypertension Mother    Alzheimer's disease Mother    Early death Father    Asthma Sister    Diabetes Sister    Heart disease Sister    Hypertension Sister      Current  Outpatient Medications:    anastrozole  (ARIMIDEX ) 1 MG tablet, Take 1 tablet (1 mg total) by mouth daily., Disp: 90 tablet, Rfl: 3   Ascorbic Acid (VITAMIN C) 1000 MG tablet, Take 1,000 mg by mouth daily., Disp: , Rfl:    aspirin EC 81 MG tablet, Take 81 mg by mouth daily., Disp: , Rfl:    b complex vitamins tablet, Take 1 tablet by mouth daily., Disp: , Rfl:    Calcium  Carbonate (CALCIUM  600 PO), Take 600 mg by mouth daily., Disp: , Rfl:    Cholecalciferol (VITAMIN D3) 125 MCG (5000 UT) CAPS, Take 5,000 Units by mouth daily., Disp: , Rfl:    Lancet Devices (ONE TOUCH DELICA LANCING DEV) MISC, USE AS DIRECTED TO CHECK BLOOD SUGARS ONCE DAILY dx: e11.22, Disp: 1 each, Rfl: 2   Lancets (ONETOUCH DELICA PLUS LANCET33G) MISC, USE DAILY AS DIRECTED, Disp: 100 each, Rfl: 2   loratadine  (CLARITIN ) 10 MG tablet, Take 1 tablet (10 mg total) by mouth daily., Disp: 90 tablet, Rfl: 1   magnesium  gluconate (MAGONATE) 500 MG tablet, Take 500 mg by mouth daily., Disp: , Rfl:    Multiple Vitamin (MULTIVITAMIN) tablet, Take 1 tablet by mouth daily., Disp: , Rfl:    olmesartan  (BENICAR ) 20 MG tablet, TAKE 1 TABLET BY MOUTH DAILY, Disp: 100 tablet, Rfl: 2   ONETOUCH VERIO test strip, USE TO CHECK BLOOD SUGAR DAILY, Disp: 100 strip, Rfl: 2   pantoprazole  (PROTONIX ) 40 MG tablet, TAKE 1 TABLET BY MOUTH DAILY, Disp: 100 tablet, Rfl: 2   Pitavastatin  Magnesium  4 MG TABS, Take 1 tablet (4 mg total) by mouth daily., Disp: 90 tablet, Rfl: 2   Semaglutide ,0.25 or 0.5MG /DOS, (OZEMPIC , 0.25 OR 0.5 MG/DOSE,) 2 MG/3ML SOPN, INJECT 0.5MG  SUBCUTANEOUSLY WEEKLY., Disp: 3 mL, Rfl: 1   zinc gluconate 50 MG tablet, Take 50 mg by mouth daily., Disp: , Rfl:    dapagliflozin  propanediol (FARXIGA ) 10 MG TABS tablet, Take 1 tablet (10 mg total) by mouth daily., Disp: 90 tablet, Rfl: 2   Allergies  Allergen Reactions   Statins Other (See Comments)    Rosuvastatin, atorvastatin, pravastatin, simvastatin muscle aching      Review of  Systems  Constitutional: Negative.   Eyes:  Negative for blurred vision.  Respiratory: Negative.    Cardiovascular: Negative.  Negative for palpitations.  Gastrointestinal: Negative.   Endocrine: Negative for polydipsia, polyphagia and polyuria.  Neurological: Negative.   Psychiatric/Behavioral: Negative.       Today's Vitals   11/30/23 0842  BP: 126/80  Pulse: 71  Temp: 98 F (36.7 C)  SpO2: 98%  Weight: 150 lb (68 kg)  Height: 4' 10 (1.473 m)   Body mass index is 31.35 kg/m.  Wt Readings from Last 3 Encounters:  11/30/23 150 lb (68 kg)  10/30/23 148 lb (67.1 kg)  10/26/23 149 lb 9.6 oz (67.9 kg)     Objective:  Physical Exam Vitals and nursing note reviewed.  Constitutional:      Appearance: Normal appearance.  HENT:     Head: Normocephalic  and atraumatic.  Eyes:     Extraocular Movements: Extraocular movements intact.  Cardiovascular:     Rate and Rhythm: Normal rate and regular rhythm.     Heart sounds: Normal heart sounds.  Pulmonary:     Effort: Pulmonary effort is normal.     Breath sounds: Normal breath sounds.  Musculoskeletal:     Cervical back: Normal range of motion.  Skin:    General: Skin is warm.  Neurological:     General: No focal deficit present.     Mental Status: She is alert.  Psychiatric:        Mood and Affect: Mood normal.        Behavior: Behavior normal.         Assessment And Plan:  Type 2 diabetes mellitus with stage 3a chronic kidney disease, without long-term current use of insulin (HCC) Assessment & Plan: Chronic.  She will continue with Farxiga  10mg  daily and Ozempic  0.5mg  weekly.  She is thankful for patient assistance.  She will f/u in four months for re-evaluation.   - Regarding CKD, she is encouraged to stay well hydrated, avoid NSAIDs and keep BP controlled to prevent progression of CKD.    Orders: -     CMP14+EGFR -     Hemoglobin A1c -     TSH -     PTH, intact and calcium  -     Phosphorus -     Protein  electrophoresis, serum  Hypertensive heart and renal disease with renal failure, stage 1 through stage 4 or unspecified chronic kidney disease, without heart failure Assessment & Plan: Chronic, controlled. Optimal BP<120/80.  She will continue with olmesartan  20mg  daily. Encouraged to follow low sodium diet. She will f/u in four to six months for re-evaluation.    Coronary artery calcification of native artery Assessment & Plan: Chronic, LDL goal is less than 70. CT shows arterial plaque, she denies experiencing any angina or dyspnea. - Order echocardiogram. - continue with statin therapy - resume regular exercise, aiming for at least 150 minutes/week - take ASA 81mg  daily  Orders: -     ECHOCARDIOGRAM COMPLETE; Future  Pure hypercholesterolemia Assessment & Plan: Chronic, LDL goal is less than 70.  She will continue with pitavastatin  4mg  daily. Encouraged to follow a heart healthy lifestyle.   Orders: -     TSH  Gastroesophageal reflux disease without esophagitis Assessment & Plan: Increased heartburn despite pantoprazole .  She admits to late night snacking. - Advise to avoid eating three hours before bedtime.   Palpitations Assessment & Plan: Nocturnal palpitations with normal BP and HR, possible dehydration or electrolyte imbalance. - Check magnesium  level. - Order Zio monitor for 14 days. - Encourage adequate hydration.  Orders: -     Magnesium  -     ECHOCARDIOGRAM COMPLETE; Future  Malignant neoplasm of lower-outer quadrant of left breast of female, estrogen receptor positive (HCC) Assessment & Plan: She is s/p left lumpectomy. She is s/p adjuvant chemotherapy with Taxotere  and Cytoxan , completed April 2024.  XRT completed July 2024.  - currently on anastrozole .  - Rising tumor levels, recent imaging unremarkable, awaiting blood results. - Await results from recent blood draw. - Follow up with oncologist as scheduled.   Class 1 obesity due to excess calories  with serious comorbidity and body mass index (BMI) of 31.0 to 31.9 in adult Assessment & Plan: She is encouraged to strive for BMI less than 30 to decrease cardiac risk. Advised to aim for at least  150 minutes of exercise per week.     Return if symptoms worsen or fail to improve.  Patient was given opportunity to ask questions. Patient verbalized understanding of the plan and was able to repeat key elements of the plan. All questions were answered to their satisfaction.   I, Catheryn LOISE Slocumb, MD, have reviewed all documentation for this visit. The documentation on 11/30/23 for the exam, diagnosis, procedures, and orders are all accurate and complete.   IF YOU HAVE BEEN REFERRED TO A SPECIALIST, IT MAY TAKE 1-2 WEEKS TO SCHEDULE/PROCESS THE REFERRAL. IF YOU HAVE NOT HEARD FROM US /SPECIALIST IN TWO WEEKS, PLEASE GIVE US  A CALL AT [phone].   THE PATIENT IS ENCOURAGED TO PRACTICE SOCIAL DISTANCING DUE TO THE COVID-19 PANDEMIC.

## 2023-11-30 NOTE — Patient Instructions (Signed)

## 2023-12-04 LAB — CMP14+EGFR
ALT: 16 IU/L (ref 0–32)
AST: 23 IU/L (ref 0–40)
Albumin: 4.2 g/dL (ref 3.8–4.8)
Alkaline Phosphatase: 101 IU/L (ref 44–121)
BUN/Creatinine Ratio: 17 (ref 12–28)
BUN: 21 mg/dL (ref 8–27)
Bilirubin Total: 0.5 mg/dL (ref 0.0–1.2)
CO2: 23 mmol/L (ref 20–29)
Calcium: 9.5 mg/dL (ref 8.7–10.3)
Chloride: 102 mmol/L (ref 96–106)
Creatinine, Ser: 1.22 mg/dL — ABNORMAL HIGH (ref 0.57–1.00)
Globulin, Total: 2.6 g/dL (ref 1.5–4.5)
Glucose: 127 mg/dL — ABNORMAL HIGH (ref 70–99)
Potassium: 4.4 mmol/L (ref 3.5–5.2)
Sodium: 142 mmol/L (ref 134–144)
Total Protein: 6.8 g/dL (ref 6.0–8.5)
eGFR: 46 mL/min/1.73 — ABNORMAL LOW (ref >59)

## 2023-12-04 LAB — PROTEIN ELECTROPHORESIS, SERUM
A/G Ratio: 1.3 (ref 0.7–1.7)
Albumin ELP: 3.9 g/dL (ref 2.9–4.4)
Alpha 1: 0.1 g/dL (ref 0.0–0.4)
Alpha 2: 0.7 g/dL (ref 0.4–1.0)
Beta: 1 g/dL (ref 0.7–1.3)
Gamma Globulin: 1.1 g/dL (ref 0.4–1.8)
Globulin, Total: 2.9 g/dL (ref 2.2–3.9)

## 2023-12-04 LAB — MAGNESIUM: Magnesium: 2.2 mg/dL (ref 1.6–2.3)

## 2023-12-04 LAB — PHOSPHORUS: Phosphorus: 3.3 mg/dL (ref 3.0–4.3)

## 2023-12-04 LAB — TSH: TSH: 1.48 u[IU]/mL (ref 0.450–4.500)

## 2023-12-04 LAB — HEMOGLOBIN A1C
Est. average glucose Bld gHb Est-mCnc: 157 mg/dL
Hgb A1c MFr Bld: 7.1 % — ABNORMAL HIGH (ref 4.8–5.6)

## 2023-12-04 LAB — PTH, INTACT AND CALCIUM: PTH: 29 pg/mL (ref 15–65)

## 2023-12-05 ENCOUNTER — Telehealth: Payer: Self-pay | Admitting: Pharmacist

## 2023-12-05 ENCOUNTER — Ambulatory Visit: Payer: Self-pay | Admitting: Internal Medicine

## 2023-12-05 DIAGNOSIS — N1831 Chronic kidney disease, stage 3a: Secondary | ICD-10-CM

## 2023-12-05 MED ORDER — DAPAGLIFLOZIN PROPANEDIOL 10 MG PO TABS: 10.0000 mg | ORAL_TABLET | Freq: Every day | ORAL | 2 refills | Status: DC

## 2023-12-05 NOTE — Progress Notes (Signed)
   12/05/2023  Patient ID: Nicole Nunez, female   DOB: [birth year], 78 y.o.   MRN: [id number]  Received message from Luke Mall, CPhT that [facility] needs a new Prescription for the Patient.  LDL 74 mg/dl Pitavastatin  4 mg is on her medication list but has not been filled this year.  A call was placed to [facility] to inquire.  The Pharmacist at [facility] said Pitavastatin  has not been filed at [facility]. Script is being filled at [facility] under the brand name Zypitamag     HgA1c- 7.1%  Zypitamag  (Pitvastatin) is filled at [facility] in [place] as a cash prescription.  Reviewed meds:  Medications Reviewed Today     Reviewed by Jolee Cassius PARAS, [facility] (Pharmacist) on 12/05/23 at 1603  Med List Status: <None>   Medication Order Taking? Sig Documenting Provider Last Dose Status Informant  anastrozole  (ARIMIDEX ) 1 MG tablet [id number] Yes Take 1 tablet (1 mg total) by mouth daily. Gudena, Vinay, MD  Active   Ascorbic Acid (VITAMIN C) 1000 MG tablet [id number] Yes Take 1,000 mg by mouth daily. [provider]  Active Self  aspirin EC 81 MG tablet [id number] Yes Take 81 mg by mouth daily. [provider]  Active Self  b complex vitamins tablet [id number] Yes Take 1 tablet by mouth daily. [provider]  Active Self  Calcium  Carbonate (CALCIUM  600 PO) [id number] Yes Take 600 mg by mouth daily. [provider]  Active Self  Cholecalciferol (VITAMIN D3) 125 MCG (5000 UT) CAPS [id number] Yes Take 5,000 Units by mouth daily. [provider]  Active Self  dapagliflozin  propanediol (FARXIGA ) 10 MG TABS tablet [id number] Yes Take 1 tablet (10 mg total) by mouth daily. Jarold Medici, MD  Active   Lancet Devices ([facility] DELICA LANCING DEV) MISC [id number] Yes USE AS DIRECTED TO CHECK BLOOD SUGARS ONCE DAILY dx: e11.22 Jarold Medici, MD  Active   Lancets [facility] [place] PLUS [place]) [place] [id number] Yes USE DAILY AS DIRECTED Jarold Medici, MD  Active   loratadine  (CLARITIN )  10 MG tablet [id number] Yes Take 1 tablet (10 mg total) by mouth daily. Jarold Medici, MD  Active   magnesium  gluconate (MAGONATE) 500 MG tablet [id number] Yes Take 500 mg by mouth daily. [provider]  Active Self  Multiple Vitamin (MULTIVITAMIN) tablet [id number] Yes Take 1 tablet by mouth daily. [provider]  Active Self  olmesartan  (BENICAR ) 20 MG tablet [id number] Yes TAKE 1 TABLET BY MOUTH DAILY Jarold Medici, MD  Active   [facility] VERIO test strip [id number] Yes USE TO CHECK BLOOD SUGAR DAILY Jarold Medici, MD  Active   pantoprazole  (PROTONIX ) 40 MG tablet [id number] Yes TAKE 1 TABLET BY MOUTH DAILY Jarold Medici, MD  Active   Pitavastatin  Magnesium  4 MG TABS [id number] Yes Take 1 tablet (4 mg total) by mouth daily. Jarold Medici, MD  Active   Semaglutide ,0.25 or 0.5MG /DOS, (OZEMPIC , 0.25 OR 0.5 MG/DOSE,) 2 MG/3ML SOPN [id number] Yes INJECT 0.5MG  SUBCUTANEOUSLY WEEKLY. Jarold Medici, MD  Active   zinc gluconate 50 MG tablet [id number] Yes Take 50 mg by mouth daily. [provider]  Active Self            Will follow up with Patient in October for re-enrollment    Cassius DOROTHA Jolee, PharmD, [facility] Clinical Pharmacist [phone]

## 2023-12-05 NOTE — Telephone Encounter (Signed)
-----   Message from Suzen SHAUNNA Mall sent at 12/05/2023  1:18 PM EDT ----- Hi, Got a notice from [facility] for this Patient needs a new Rx sent to [facility] for Farxiga - sent to media for your reference. Thanks!

## 2023-12-10 DIAGNOSIS — I2510 Atherosclerotic heart disease of native coronary artery without angina pectoris: Secondary | ICD-10-CM | POA: Insufficient documentation

## 2023-12-10 DIAGNOSIS — R002 Palpitations: Secondary | ICD-10-CM | POA: Insufficient documentation

## 2023-12-10 DIAGNOSIS — K219 Gastro-esophageal reflux disease without esophagitis: Secondary | ICD-10-CM | POA: Insufficient documentation

## 2023-12-10 NOTE — Assessment & Plan Note (Signed)
 Nocturnal palpitations with normal BP and HR, possible dehydration or electrolyte imbalance. - Check magnesium  level. - Order Zio monitor for 14 days. - Encourage adequate hydration.

## 2023-12-10 NOTE — Assessment & Plan Note (Addendum)
 Chronic, LDL goal is less than 70. CT shows arterial plaque, she denies experiencing any angina or dyspnea. - Order echocardiogram. - continue with statin therapy - resume regular exercise, aiming for at least 150 minutes/week - take ASA 81mg  daily

## 2023-12-10 NOTE — Assessment & Plan Note (Signed)
 Chronic.  She will continue with Farxiga  10mg  daily and Ozempic  0.5mg  weekly.  She is thankful for patient assistance.  She will f/u in four months for re-evaluation.   - Regarding CKD, she is encouraged to stay well hydrated, avoid NSAIDs and keep BP controlled to prevent progression of CKD.

## 2023-12-10 NOTE — Assessment & Plan Note (Signed)
 Chronic, controlled. Optimal BP<120/80.  She will continue with olmesartan 20mg  daily. Encouraged to follow low sodium diet. She will f/u in four to six months for re-evaluation.

## 2023-12-10 NOTE — Assessment & Plan Note (Signed)
 Increased heartburn despite pantoprazole .  She admits to late night snacking. - Advise to avoid eating three hours before bedtime.

## 2023-12-10 NOTE — Assessment & Plan Note (Signed)
Chronic, LDL goal is less than 70.  She will continue with pitavastatin 4mg  daily. Encouraged to follow a heart healthy lifestyle.

## 2023-12-10 NOTE — Assessment & Plan Note (Signed)
 She is encouraged to strive for BMI less than 30 to decrease cardiac risk. Advised to aim for at least 150 minutes of exercise per week.

## 2023-12-10 NOTE — Assessment & Plan Note (Signed)
 She is s/p left lumpectomy. She is s/p adjuvant chemotherapy with Taxotere  and Cytoxan , completed April 2024.  XRT completed July 2024.  - currently on anastrozole .  - Rising tumor levels, recent imaging unremarkable, awaiting blood results. - Await results from recent blood draw. - Follow up with oncologist as scheduled.

## 2023-12-13 ENCOUNTER — Telehealth: Payer: Self-pay | Admitting: Hematology and Oncology

## 2023-12-13 NOTE — Telephone Encounter (Signed)
 Spoke with patient confirming upcoming appointment

## 2023-12-15 ENCOUNTER — Ambulatory Visit (HOSPITAL_COMMUNITY)
Admission: RE | Admit: 2023-12-15 | Discharge: 2023-12-15 | Disposition: A | Discharge status: Home / Self Care | Source: Ambulatory Visit | Attending: Cardiology | Admitting: Cardiology

## 2023-12-15 DIAGNOSIS — I2584 Coronary atherosclerosis due to calcified coronary lesion: Secondary | ICD-10-CM | POA: Diagnosis not present

## 2023-12-15 DIAGNOSIS — R002 Palpitations: Secondary | ICD-10-CM | POA: Diagnosis not present

## 2023-12-15 DIAGNOSIS — I2510 Atherosclerotic heart disease of native coronary artery without angina pectoris: Secondary | ICD-10-CM | POA: Insufficient documentation

## 2023-12-15 LAB — ECHOCARDIOGRAM COMPLETE
AR max vel: 1.71 cm2
AV Area VTI: 1.7 cm2
AV Area mean vel: 1.64 cm2
AV Mean grad: 2 mmHg
AV Peak grad: 3.1 mmHg
Ao pk vel: 0.88 m/s
Area-P 1/2: 3.5 cm2
S' Lateral: 2.8 cm

## 2023-12-22 DIAGNOSIS — M1711 Unilateral primary osteoarthritis, right knee: Secondary | ICD-10-CM | POA: Diagnosis not present

## 2023-12-22 DIAGNOSIS — M1712 Unilateral primary osteoarthritis, left knee: Secondary | ICD-10-CM | POA: Diagnosis not present

## 2023-12-22 DIAGNOSIS — M47816 Spondylosis without myelopathy or radiculopathy, lumbar region: Secondary | ICD-10-CM | POA: Diagnosis not present

## 2023-12-27 ENCOUNTER — Inpatient Hospital Stay: Admitting: Hematology and Oncology

## 2024-01-01 ENCOUNTER — Inpatient Hospital Stay: Attending: Hematology and Oncology | Admitting: Adult Health

## 2024-01-01 DIAGNOSIS — C50512 Malignant neoplasm of lower-outer quadrant of left female breast: Secondary | ICD-10-CM

## 2024-01-01 DIAGNOSIS — Z170 Estrogen receptor positive status [ER+]: Secondary | ICD-10-CM

## 2024-01-01 NOTE — Progress Notes (Signed)
  Cancer Center Cancer Follow up:    Jarold Medici, MD [address] [place] [address] [place] [address]   DIAGNOSIS:  Cancer Staging  Malignant neoplasm of lower-outer quadrant of left breast of female, estrogen receptor positive (HCC) Staging form: Breast, AJCC 8th Edition - Clinical: Stage IA (cT1b, cN0, cM0, G2, ER+, PR+, HER2-) - Signed by Lanell Donald Stagger, PA-C on 06/05/2022 Method of lymph node assessment: Clinical Histologic grading system: 3 grade system - Pathologic stage from 10/10/2022: Stage IB (pT1c, pN0, cM0, G3, ER-, PR-, HER2-) - Signed by Lanell Donald Stagger, PA-C on 10/10/2022 Histologic grading system: 3 grade system  I connected with Nicole Nunez on 01/01/24 at  2:00 PM EDT by telephone and verified that I am speaking with the correct person using two identifiers.  I discussed the limitations, risks, security and privacy concerns of performing an evaluation and management service by telephone and the availability of in person appointments.  I also discussed with the patient that there may be a patient responsible charge related to this service. The patient expressed understanding and agreed to proceed.  Patient location: home Provider location: [facility] office  SUMMARY OF ONCOLOGIC HISTORY: Oncology History  Malignant neoplasm of lower-outer quadrant of left breast of female, estrogen receptor positive (HCC)  05/18/2022 Initial Diagnosis   05/18/2022: Screening mammogram detected left breast mass by ultrasound it measured 9 mm, biopsy revealed grade 2 IDC ER 90%, PR 15%, Ki-67 50%, HER2 2+ by IHC, FISH negative ratio 2.17, Copy #3.9, additional 20 cells were examined ratio came at 2.19 and copy #3.72   06/05/2022 Cancer Staging   Staging form: Breast, AJCC 8th Edition - Clinical: Stage IA (cT1b, cN0, cM0, G2, ER+, PR+, HER2-) - Signed by Lanell Donald Stagger, PA-C on 06/05/2022 Method of lymph node assessment: Clinical Histologic grading system: 3  grade system   06/21/2022 Surgery   Left lumpectomy: Grade 3 IDC 1.5 cm, margins negative, no lymph node (adipose tissue was in the sentinel node) ER 90% weak to moderate, PR 15% weak to strong, HER2 negative, Ki-67 50% Repeat prognostic panel: ER 0%, PR 0%, Ki-67 35%, HER2 2+ by IHC FISH negative ratio 1.72 (triple negative breast cancer)   08/09/2022 - 09/22/2022 Chemotherapy   Patient is on Treatment Plan : BREAST TC q21d     10/10/2022 Cancer Staging   Staging form: Breast, AJCC 8th Edition - Pathologic stage from 10/10/2022: Stage IB (pT1c, pN0, cM0, G3, ER-, PR-, HER2-) - Signed by Lanell Donald Stagger, PA-C on 10/10/2022 Histologic grading system: 3 grade system   10/27/2022 - 11/23/2022 Radiation Therapy   Plan Name: Breast_L_BH Site: Breast, Left Technique: 3D Mode: Photon Dose Per Fraction: 2.66 Gy Prescribed Dose (Delivered / Prescribed): 42.56 Gy / 42.56 Gy Prescribed Fxs (Delivered / Prescribed): 16 / 16   Plan Name: Brst_L_Bst_BH Site: Breast, Left Technique: 3D Mode: Photon Dose Per Fraction: 2 Gy Prescribed Dose (Delivered / Prescribed): 8 Gy / 8 Gy Prescribed Fxs (Delivered / Prescribed): 4 / 4     CURRENT THERAPY: Anastrozole   INTERVAL HISTORY:  Discussed the use of AI scribe software for clinical note transcription with the patient, who gave verbal consent to proceed.  History of Present Illness Nicole Nunez is a 78 year old female with left breast invasive ductal carcinoma who presents for a follow-up phone visit.  She has undergone lumpectomy, adjuvant chemotherapy, and adjuvant radiotherapy for left breast invasive ductal carcinoma. She is currently undergoing Signatera testing, which was positive  at 1.67. Recent imaging on April 30th showed unchanged tiny pulmonary nodules with no signs of metastasis.  She continues on Anastrozole  with good tolerance.  She experiences no new symptoms such as shortness of breath, cough, fever, or chills, and reports no  new pain.  She continues to have hot flashes, mostly between 3:00 and 5:00 AM, with variable intensity.     Patient Active Problem List   Diagnosis Date Noted   Coronary artery calcification of native artery 12/10/2023   Palpitations 12/10/2023   Gastroesophageal reflux disease without esophagitis 12/10/2023   Encounter for general adult medical examination w/o abnormal findings 03/22/2023   Mild nonproliferative diabetic retinopathy of both eyes associated with type 2 diabetes mellitus (HCC) 03/22/2023   Port-A-Cath in place 08/16/2022   Malignant neoplasm of lower-outer quadrant of left breast of female, estrogen receptor positive (HCC) 05/31/2022   Statin myopathy 02/04/2021   Notalgia 01/29/2020   Vitamin D  deficiency disease 01/29/2020   Class 1 obesity due to excess calories with serious comorbidity and body mass index (BMI) of 30.0 to 30.9 in adult 01/29/2020   Type 2 diabetes mellitus with stage 3 chronic kidney disease, without long-term current use of insulin (HCC) 05/24/2018   Chronic renal disease, stage III (HCC) 05/24/2018   Parenchymal renal hypertension 05/24/2018   Pure hypercholesterolemia 05/24/2018   Class 1 obesity due to excess calories with serious comorbidity and body mass index (BMI) of 31.0 to 31.9 in adult 05/24/2018    is allergic to statins.  MEDICAL HISTORY: Past Medical History:  Diagnosis Date   Arthritis    Breast cancer (HCC) 05/18/2022   CKD stage 3 due to type 2 diabetes mellitus (HCC)    DM (diabetes mellitus) (HCC)    GERD (gastroesophageal reflux disease)    High cholesterol    HTN (hypertension)     SURGICAL HISTORY: Past Surgical History:  Procedure Laterality Date   BREAST BIOPSY Left 05/18/2022   US  LT BREAST BX W LOC DEV 1ST LESION IMG BX SPEC US  GUIDE 05/18/2022 [facility]   BREAST BIOPSY Left 06/20/2022   US  LT RADIOACTIVE SEED LOC 06/20/2022 [facility]MO[facility]   BREAST LUMPECTOMY WITH RADIOACTIVE SEED AND AXILLARY  LYMPH NODE DISSECTION Left 06/21/2022   Procedure: LEFT BREAST LUMPECTOMY WITH RADIOACTIVE SEED AND AXILLARY NODE DISSECTION;  Surgeon: Vanderbilt Ned, MD;  Location: [facility];  Service: General;  Laterality: Left;   CATARACT EXTRACTION, BILATERAL  01/2018   first was 12/2017   corn removal Bilateral 06/2017   EYE SURGERY     PORT-A-CATH REMOVAL N/A 11/01/2022   Procedure: REMOVAL PORT-A-CATH;  Surgeon: Vanderbilt Ned, MD;  Location: [facility];  Service: General;  Laterality: N/A;   PORTACATH PLACEMENT N/A 07/21/2022   Procedure: INSERTION PORT-A-CATH;  Surgeon: Vanderbilt Ned, MD;  Location: [facility];  Service: General;  Laterality: N/A;  90 MIN ROOM 4-OK'D PER KELLY   SENTINEL NODE BIOPSY Left 07/21/2022   Procedure: LEFT SENTINEL NODE BIOPSY;  Surgeon: Vanderbilt Ned, MD;  Location: [facility];  Service: General;  Laterality: Left;    SOCIAL HISTORY: Social History   Socioeconomic History   Marital status: Widowed    Spouse name: Not on file   Number of children: Not on file   Years of education: Not on file   Highest education level: GED or equivalent  Occupational History   Not on file  Tobacco Use   Smoking status: Never   Smokeless tobacco: Never  Vaping Use  Vaping status: Never Used  Substance and Sexual Activity   Alcohol use: Never   Drug use: Never   Sexual activity: Yes  Other Topics Concern   Not on file  Social History Narrative   Not on file   Social Drivers of Health   Financial Resource Strain: Low Risk  (11/29/2023)   Overall Financial Resource Strain (CARDIA)    Difficulty of Paying Living Expenses: Not hard at all  Food Insecurity: No Food Insecurity (11/29/2023)   Hunger Vital Sign    Worried About Running Out of Food in the Last Year: Never true    Ran Out of Food in the Last Year: Never true  Transportation Needs: No Transportation Needs (11/29/2023)   PRAPARE - Administrator, Civil Service (Medical): No     Lack of Transportation (Non-Medical): No  Physical Activity: Inactive (11/29/2023)   Exercise Vital Sign    Days of Exercise per Week: 0 days    Minutes of Exercise per Session: Not on file  Stress: No Stress Concern Present (11/29/2023)   [employer] of Occupational Health - Occupational Stress Questionnaire    Feeling of Stress: Not at all  Social Connections: Moderately Integrated (11/29/2023)   Social Connection and Isolation Panel    Frequency of Communication with Friends and Family: More than three times a week    Frequency of Social Gatherings with Friends and Family: Three times a week    Attends Religious Services: More than 4 times per year    Active Member of Clubs or Organizations: Yes    Attends Banker Meetings: More than 4 times per year    Marital Status: Widowed  Intimate Partner Violence: Not At Risk (03/02/2023)   Humiliation, Afraid, Rape, and Kick questionnaire    Fear of Current or Ex-Partner: No    Emotionally Abused: No    Physically Abused: No    Sexually Abused: No    FAMILY HISTORY: Family History  Problem Relation Age of Onset   Asthma Mother    Hypertension Mother    Alzheimer's disease Mother    Early death Father    Asthma Sister    Diabetes Sister    Heart disease Sister    Hypertension Sister     Review of Systems  Constitutional:  Negative for appetite change, chills, fatigue, fever and unexpected weight change.  HENT:   Negative for hearing loss, lump/mass and trouble swallowing.   Eyes:  Negative for eye problems and icterus.  Respiratory:  Negative for chest tightness, cough and shortness of breath.   Cardiovascular:  Negative for chest pain, leg swelling and palpitations.  Gastrointestinal:  Negative for abdominal distention, abdominal pain, constipation, diarrhea, nausea and vomiting.  Endocrine: Positive for hot flashes (occasional).  Genitourinary:  Negative for difficulty urinating.   Musculoskeletal:  Negative for  arthralgias.  Skin:  Negative for itching and rash.  Neurological:  Negative for dizziness, extremity weakness, headaches and numbness.  Hematological:  Negative for adenopathy. Does not bruise/bleed easily.  Psychiatric/Behavioral:  Negative for depression. The patient is not nervous/anxious.       PHYSICAL EXAMINATION  Patient sounds well.  She is in no apparent distress.  Mood and behavior are normal.  Speech is normal.  Breathing sounds unlabored.    LABORATORY DATA:  CBC    Component Value Date/Time   WBC 3.9 07/25/2023 1007   WBC 7.3 09/20/2022 0928   WBC 5.6 11/11/2008 1237   RBC 4.23 07/25/2023 1007  RBC 3.32 (L) 09/20/2022 0928   HGB 11.8 07/25/2023 1007   HCT 37.6 07/25/2023 1007   PLT 314 07/25/2023 1007   MCV 89 07/25/2023 1007   MCH 27.9 07/25/2023 1007   MCH 28.9 09/20/2022 0928   MCHC 31.4 (L) 07/25/2023 1007   MCHC 33.1 09/20/2022 0928   RDW 13.5 07/25/2023 1007   LYMPHSABS 1.1 09/20/2022 0928   MONOABS 0.7 09/20/2022 0928   EOSABS 0.0 09/20/2022 0928   BASOSABS 0.1 09/20/2022 0928    CMP     Component Value Date/Time   NA 142 11/30/2023 0907   K 4.4 11/30/2023 0907   CL 102 11/30/2023 0907   CO2 23 11/30/2023 0907   GLUCOSE 127 (H) 11/30/2023 0907   GLUCOSE 128 (H) 10/25/2022 1213   BUN 21 11/30/2023 0907   CREATININE 1.22 (H) 11/30/2023 0907   CREATININE 1.28 (H) 09/20/2022 0928   CALCIUM  9.5 11/30/2023 0907   PROT 6.8 11/30/2023 0907   ALBUMIN 4.2 11/30/2023 0907   AST 23 11/30/2023 0907   AST 27 09/20/2022 0928   ALT 16 11/30/2023 0907   ALT 30 09/20/2022 0928   ALKPHOS 101 11/30/2023 0907   BILITOT 0.5 11/30/2023 0907   BILITOT 0.5 09/20/2022 0928   GFRNONAA 51 (L) 10/25/2022 1213   GFRNONAA 44 (L) 09/20/2022 0928   GFRAA 47 (L) 06/02/2020 0942     ASSESSMENT and THERAPY PLAN:   Malignant neoplasm of lower-outer quadrant of left breast of female, estrogen receptor positive (HCC) 05/18/2022: Screening mammogram detected left  breast mass by ultrasound it measured 9 mm, biopsy revealed grade 2 IDC ER 90%, PR 15%, Ki-67 50%, HER2 2+ by IHC, FISH negative ratio 2.17, Copy #3.9, additional 20 cells were examined ratio came at 2.19 and copy #3.72    06/21/2022: Left lumpectomy: Grade 3 IDC 1.5 cm, margins negative, no lymph node (adipose tissue was in the sentinel node) ER 90% weak to moderate, PR 15% weak to strong, HER2 negative, Ki-67 50% Repeat prognostic panel: ER 0%, PR 0%, Ki-67 35%, HER2 2+ by IHC FISH negative ratio 1.72 (triple negative breast cancer)   Treatment plan: Oncotype DX testing was canceled because the cancer was triple negative.  Recommend adjuvant chemotherapy with Taxotere  and Cytoxan  every 3 weeks x 3 completed 09/20/2022 Adjuvant radiation therapy completed 11/23/2022 -----------------------------------------------------------------------------------------------------------------------------------  Current treatment: Surveillance CT CAP 09/25/2023: Few tiny lung nodules 5 mm in size unchanged.  No evidence of metastatic disease. Bone scan 09/20/2023: No evidence of metastatic disease. Mammogram 05/25/2023: Benign breast density category B  Signatera testing:  03/15/2023: +0.02 06/09/2023:   +0.71 08/28/2023:     +1.83 11/17/2023:   +1.67    Recommendation:  Signatera every 3 months Anastrozole  daily Repeat imaging 03/2024  Assessment and Plan Assessment & Plan History of left breast invasive ductal carcinoma, status post lumpectomy, chemotherapy, and radiotherapy, on anastrozole , under surveillance for recurrence Currently on anastrozole . Signatera testing positive at 1.67. Imaging on April 30th showed unchanged tiny pulmonary nodules with no metastasis. No new symptoms. Tolerating anastrozole  well without nausea, lightheadedness, or increased joint aches.  - Continue anastrozole  therapy. - Proceed with CT chest, abd, pelvis in November before f/u with Dr. Odean.  - Monitor for symptoms such as  cough, shortness of breath, palpitations, or new pain and report if she occurs.  Hot flashes associated with anastrozole  therapy Experiencing hot flashes, mostly between 3:00 and 5:00 AM, with variable intensity and not consistently present every night. - Monitor hot flashes and report  any significant changes.  Follow up instructions:    -Return to cancer center in 03/2024 for f/u with Dr. Gudena  -CT chest/abdomen/pelvis prior to f/u with Dr. Odean in 03/2024   The patient was provided an opportunity to ask questions and all were answered. The patient agreed with the plan and demonstrated an understanding of the instructions.   The patient was advised to call back or seek an in-person evaluation if the symptoms worsen or if the condition fails to improve as anticipated.   I provided 10 minutes of non face-to-face telephone visit time during this encounter, and > 50% was spent counseling as documented under my assessment & plan.   Morna Kendall, NP 01/01/24 3:17 PM Medical Oncology and Hematology [facility] [address] [place], [place] [address] Tel. [phone]    Fax. [phone]  *Total Encounter Time as defined by the Centers for Medicare and Medicaid Services includes, in addition to the face-to-face time of a patient visit (documented in the note above) non-face-to-face time: obtaining and reviewing outside history, ordering and reviewing medications, tests or procedures, care coordination (communications with other health care professionals or caregivers) and documentation in the medical record.

## 2024-01-01 NOTE — Assessment & Plan Note (Signed)
 05/18/2022: Screening mammogram detected left breast mass by ultrasound it measured 9 mm, biopsy revealed grade 2 IDC ER 90%, PR 15%, Ki-67 50%, HER2 2+ by IHC, FISH negative ratio 2.17, Copy #3.9, additional 20 cells were examined ratio came at 2.19 and copy #3.72    06/21/2022: Left lumpectomy: Grade 3 IDC 1.5 cm, margins negative, no lymph node (adipose tissue was in the sentinel node) ER 90% weak to moderate, PR 15% weak to strong, HER2 negative, Ki-67 50% Repeat prognostic panel: ER 0%, PR 0%, Ki-67 35%, HER2 2+ by IHC FISH negative ratio 1.72 (triple negative breast cancer)   Treatment plan: Oncotype DX testing was canceled because the cancer was triple negative.  Recommend adjuvant chemotherapy with Taxotere  and Cytoxan  every 3 weeks x 3 completed 09/20/2022 Adjuvant radiation therapy completed 11/23/2022 -----------------------------------------------------------------------------------------------------------------------------------  Current treatment: Surveillance CT CAP 09/25/2023: Few tiny lung nodules 5 mm in size unchanged.  No evidence of metastatic disease. Bone scan 09/20/2023: No evidence of metastatic disease. Mammogram 05/25/2023: Benign breast density category B  Signatera testing:  03/15/2023: +0.02 06/09/2023:   +0.71 08/28/2023:     +1.83 11/17/2023:   +1.67    Recommendation:  Signatera every 3 months Anastrozole  daily Repeat imaging 03/2024

## 2024-01-16 ENCOUNTER — Telehealth: Payer: Self-pay

## 2024-01-16 NOTE — Telephone Encounter (Signed)
 Completed refill/ order form for Ozempic  ([name]) and faxed to provider's office for review and signature.

## 2024-02-02 ENCOUNTER — Other Ambulatory Visit: Payer: Self-pay | Admitting: Hematology and Oncology

## 2024-02-05 DIAGNOSIS — E118 Type 2 diabetes mellitus with unspecified complications: Secondary | ICD-10-CM | POA: Diagnosis not present

## 2024-02-05 DIAGNOSIS — M79675 Pain in left toe(s): Secondary | ICD-10-CM | POA: Diagnosis not present

## 2024-02-05 DIAGNOSIS — B351 Tinea unguium: Secondary | ICD-10-CM | POA: Diagnosis not present

## 2024-02-05 DIAGNOSIS — L84 Corns and callosities: Secondary | ICD-10-CM | POA: Diagnosis not present

## 2024-02-12 ENCOUNTER — Ambulatory Visit: Attending: Surgery

## 2024-02-12 VITALS — Wt 143.2 lb

## 2024-02-12 DIAGNOSIS — Z483 Aftercare following surgery for neoplasm: Secondary | ICD-10-CM | POA: Insufficient documentation

## 2024-02-12 NOTE — Therapy (Signed)
 OUTPATIENT PHYSICAL THERAPY SOZO SCREENING NOTE   Patient Name: Nicole Nunez MRN: [id number] DOB:11-25-1945, 78 y.o., female Today's Date: 02/12/2024  PCP: Jarold Medici, MD REFERRING PROVIDER: Vanderbilt Ned, MD   PT End of Session - 02/12/24 0913     Visit Number 1   # unchanged due to screen only   PT Start Time 0912    PT Stop Time 0916    PT Time Calculation (min) 4 min    Activity Tolerance Patient tolerated treatment well    Behavior During Therapy [facility] for tasks assessed/performed          Past Medical History:  Diagnosis Date   Arthritis    Breast cancer (HCC) 05/18/2022   CKD stage 3 due to type 2 diabetes mellitus (HCC)    DM (diabetes mellitus) (HCC)    GERD (gastroesophageal reflux disease)    High cholesterol    HTN (hypertension)    Past Surgical History:  Procedure Laterality Date   BREAST BIOPSY Left 05/18/2022   US  LT BREAST BX W LOC DEV 1ST LESION IMG BX SPEC US  GUIDE 05/18/2022 [facility]   BREAST BIOPSY Left 06/20/2022   US  LT RADIOACTIVE SEED LOC 06/20/2022 [facility]   BREAST LUMPECTOMY WITH RADIOACTIVE SEED AND AXILLARY LYMPH NODE DISSECTION Left 06/21/2022   Procedure: LEFT BREAST LUMPECTOMY WITH RADIOACTIVE SEED AND AXILLARY NODE DISSECTION;  Surgeon: Vanderbilt Ned, MD;  Location: [facility];  Service: General;  Laterality: Left;   CATARACT EXTRACTION, BILATERAL  01/2018   first was 12/2017   corn removal Bilateral 06/2017   EYE SURGERY     PORT-A-CATH REMOVAL N/A 11/01/2022   Procedure: REMOVAL PORT-A-CATH;  Surgeon: Vanderbilt Ned, MD;  Location: [facility];  Service: General;  Laterality: N/A;   PORTACATH PLACEMENT N/A 07/21/2022   Procedure: INSERTION PORT-A-CATH;  Surgeon: Vanderbilt Ned, MD;  Location: [facility];  Service: General;  Laterality: N/A;  90 MIN ROOM 4-OK'D PER KELLY   SENTINEL NODE BIOPSY Left 07/21/2022   Procedure: LEFT SENTINEL NODE BIOPSY;  Surgeon: Vanderbilt Ned, MD;   Location: [facility];  Service: General;  Laterality: Left;   Patient Active Problem List   Diagnosis Date Noted   Coronary artery calcification of native artery 12/10/2023   Palpitations 12/10/2023   Gastroesophageal reflux disease without esophagitis 12/10/2023   Encounter for general adult medical examination w/o abnormal findings 03/22/2023   Mild nonproliferative diabetic retinopathy of both eyes associated with type 2 diabetes mellitus (HCC) 03/22/2023   Port-A-Cath in place 08/16/2022   Malignant neoplasm of lower-outer quadrant of left breast of female, estrogen receptor positive (HCC) 05/31/2022   Statin myopathy 02/04/2021   Notalgia 01/29/2020   Vitamin D  deficiency disease 01/29/2020   Class 1 obesity due to excess calories with serious comorbidity and body mass index (BMI) of 30.0 to 30.9 in adult 01/29/2020   Type 2 diabetes mellitus with stage 3 chronic kidney disease, without long-term current use of insulin (HCC) 05/24/2018   Chronic renal disease, stage III (HCC) 05/24/2018   Parenchymal renal hypertension 05/24/2018   Pure hypercholesterolemia 05/24/2018   Class 1 obesity due to excess calories with serious comorbidity and body mass index (BMI) of 31.0 to 31.9 in adult 05/24/2018    REFERRING DIAG: left breast cancer at risk for lymphedema  THERAPY DIAG: Aftercare following surgery for neoplasm  PERTINENT HISTORY: Patient was diagnosed left IDC. It measures 9mm and is located in the upper outer quadrant. It is ER/PR positive with  a Ki67 of 50%.  lumpectomy and SLNB on 06/21/22 but with no lymph nodes revealed in tissue. Will be having chemotherapy TC followed by radiation as well as sentinel lymph node mapping.   PRECAUTIONS: left UE Lymphedema risk, None  SUBJECTIVE: Pt returns for her 3 month L-Dex screen.   PAIN:  Are you having pain? No  SOZO SCREENING: Patient was assessed today using the SOZO machine to determine the lymphedema index score. This was compared to  her baseline score. It was determined that she is within the recommended range when compared to her baseline and no further action is needed at this time. She will continue SOZO screenings. These are done every 3 months for 2 years post operatively followed by every 6 months for 2 years, and then annually.   L-DEX FLOWSHEETS - 02/12/24 0900       L-DEX LYMPHEDEMA SCREENING   Measurement Type Unilateral    L-DEX MEASUREMENT EXTREMITY Upper Extremity    POSITION  Standing    DOMINANT SIDE Right    At Risk Side Left    BASELINE SCORE (UNILATERAL) 1.2    L-DEX SCORE (UNILATERAL) 2.7    VALUE CHANGE (UNILAT) 1.5          P: Cont one more 3 month screen then transition to 6 months.   Aden Berwyn Caldron, PTA 02/12/2024, 9:15 AM

## 2024-02-16 DIAGNOSIS — C50512 Malignant neoplasm of lower-outer quadrant of left female breast: Secondary | ICD-10-CM | POA: Diagnosis not present

## 2024-02-16 DIAGNOSIS — Z170 Estrogen receptor positive status [ER+]: Secondary | ICD-10-CM | POA: Diagnosis not present

## 2024-02-25 LAB — SIGNATERA ONLY (NATERA MANAGED)
SIGNATERA MTM READOUT: 0.75 MTM/ml — AB
SIGNATERA TEST RESULT: POSITIVE — AB

## 2024-02-27 ENCOUNTER — Encounter: Payer: Self-pay | Admitting: *Deleted

## 2024-02-27 NOTE — Progress Notes (Signed)
 Per MD request, RN successfully faxed [employer] request to [phone].

## 2024-02-28 ENCOUNTER — Telehealth: Payer: Self-pay | Admitting: Pharmacist

## 2024-02-28 ENCOUNTER — Telehealth: Payer: Self-pay

## 2024-02-28 DIAGNOSIS — N183 Chronic kidney disease, stage 3 unspecified: Secondary | ICD-10-CM

## 2024-02-28 NOTE — Telephone Encounter (Signed)
 PAP: Patient assistance application for Farxiga  through [employer] ([employer]) has been mailed to pt's home address on file. Provider portion of application will be faxed to provider's office. For 2026 renewal

## 2024-02-28 NOTE — Progress Notes (Unsigned)
   02/28/2024  Patient ID: Nicole Nunez, female   DOB: 03-22-1946, 78 y.o.   MRN: [id number]  Called Patient to complete an Extra Help/LIS application over the phone. HIPAA identifiers were obtained. We received a letter from [employer] stating the Patient's income was close to the income cut off for LIS/Extra Help and because of that a LIS denial would need to accompany her applicatoin.  Extra Help application was completed.  Patient should hear back within 3-4 weeks.  Lab Results  Component Value Date   HGBA1C 7.1 (H) 11/30/2023   HGBA1C 7.3 (H) 07/25/2023   HGBA1C 7.3 (H) 03/15/2023     Plan: Will follow up with the Patient in a month.   Cassius DOROTHA Brought, PharmD, BCACP Clinical Pharmacist [phone]

## 2024-03-06 ENCOUNTER — Ambulatory Visit: Payer: Medicare Other

## 2024-03-13 ENCOUNTER — Ambulatory Visit

## 2024-03-13 DIAGNOSIS — Z0000 Encounter for general adult medical examination without abnormal findings: Secondary | ICD-10-CM | POA: Diagnosis not present

## 2024-03-13 NOTE — Progress Notes (Signed)
 Subjective:   Nicole Nunez is a 78 y.o. who presents for a Medicare Wellness preventive visit.  As a reminder, Annual Wellness Visits don't include a physical exam, and some assessments may be limited, especially if this visit is performed virtually. We may recommend an in-person follow-up visit with your provider if needed.  Visit Complete: Virtual I connected with  Nicole Nunez on 03/13/24 by a audio enabled telemedicine application and verified that I am speaking with the correct person using two identifiers.  Patient Location: Home  Provider Location: Office/Clinic  I discussed the limitations of evaluation and management by telemedicine. The patient expressed understanding and agreed to proceed.  Vital Signs: Because this visit was a virtual/telehealth visit, some criteria may be missing or patient reported. Any vitals not documented were not able to be obtained and vitals that have been documented are patient reported.  VideoError- Librarian, academic were attempted between this provider and patient, however failed, due to patient having technical difficulties OR patient did not have access to video capability.  We continued and completed visit with audio only.   Persons Participating in Visit: Patient.  AWV Questionnaire: Yes: Patient Medicare AWV questionnaire was completed by the patient on 03/09/2024; I have confirmed that all information answered by patient is correct and no changes since this date.  Cardiac Risk Factors include: advanced age (>54men, >47 women);diabetes mellitus     Objective:    Today's Vitals   There is no height or weight on file to calculate BMI.     10/26/2023    8:54 AM 03/02/2023    8:33 AM 02/28/2023   10:16 AM 11/28/2022    3:23 PM 11/01/2022    8:26 AM 10/11/2022    9:19 AM 09/20/2022    9:47 AM  Advanced Directives  Does Patient Have a Medical Advance Directive? No No No No No No No  Would patient like  information on creating a medical advance directive? Yes (MAU/Ambulatory/Procedural Areas - Information given)   Yes (ED - Information included in AVS) No - Patient declined Yes (ED - Information included in AVS) No - Patient declined    Current Medications (verified) Outpatient Encounter Medications as of 03/13/2024  Medication Sig   anastrozole  (ARIMIDEX ) 1 MG tablet TAKE 1 TABLET BY MOUTH EVERY DAY   Ascorbic Acid (VITAMIN C) 1000 MG tablet Take 1,000 mg by mouth daily.   aspirin EC 81 MG tablet Take 81 mg by mouth daily.   b complex vitamins tablet Take 1 tablet by mouth daily.   Calcium  Carbonate (CALCIUM  600 PO) Take 600 mg by mouth daily.   Cholecalciferol (VITAMIN D3) 125 MCG (5000 UT) CAPS Take 5,000 Units by mouth daily.   dapagliflozin  propanediol (FARXIGA ) 10 MG TABS tablet Take 1 tablet (10 mg total) by mouth daily.   Lancet Devices (ONE TOUCH DELICA LANCING DEV) MISC USE AS DIRECTED TO CHECK BLOOD SUGARS ONCE DAILY dx: e11.22   Lancets (ONETOUCH DELICA PLUS LANCET33G) MISC USE DAILY AS DIRECTED   loratadine  (CLARITIN ) 10 MG tablet Take 1 tablet (10 mg total) by mouth daily.   magnesium  gluconate (MAGONATE) 500 MG tablet Take 500 mg by mouth daily.   Multiple Vitamin (MULTIVITAMIN) tablet Take 1 tablet by mouth daily.   olmesartan  (BENICAR ) 20 MG tablet TAKE 1 TABLET BY MOUTH DAILY   ONETOUCH VERIO test strip USE TO CHECK BLOOD SUGAR DAILY   pantoprazole  (PROTONIX ) 40 MG tablet TAKE 1 TABLET BY MOUTH DAILY  Pitavastatin  Magnesium  4 MG TABS Take 1 tablet (4 mg total) by mouth daily.   Semaglutide ,0.25 or 0.5MG /DOS, (OZEMPIC , 0.25 OR 0.5 MG/DOSE,) 2 MG/3ML SOPN INJECT 0.5MG  SUBCUTANEOUSLY WEEKLY.   zinc gluconate 50 MG tablet Take 50 mg by mouth daily.   No facility-administered encounter medications on file as of 03/13/2024.    Allergies (verified) Statins   History: Past Medical History:  Diagnosis Date   Arthritis    Breast cancer (HCC) 05/18/2022   CKD stage 3 due  to type 2 diabetes mellitus (HCC)    DM (diabetes mellitus) (HCC)    GERD (gastroesophageal reflux disease)    High cholesterol    HTN (hypertension)    Past Surgical History:  Procedure Laterality Date   BREAST BIOPSY Left 05/18/2022   US  LT BREAST BX W LOC DEV 1ST LESION IMG BX SPEC US  GUIDE 05/18/2022 [facility]   BREAST BIOPSY Left 06/20/2022   US  LT RADIOACTIVE SEED LOC 06/20/2022 [facility]   BREAST LUMPECTOMY WITH RADIOACTIVE SEED AND AXILLARY LYMPH NODE DISSECTION Left 06/21/2022   Procedure: LEFT BREAST LUMPECTOMY WITH RADIOACTIVE SEED AND AXILLARY NODE DISSECTION;  Surgeon: Vanderbilt Ned, MD;  Location: [facility];  Service: General;  Laterality: Left;   CATARACT EXTRACTION, BILATERAL  01/2018   first was 12/2017   corn removal Bilateral 06/2017   EYE SURGERY     PORT-A-CATH REMOVAL N/A 11/01/2022   Procedure: REMOVAL PORT-A-CATH;  Surgeon: Vanderbilt Ned, MD;  Location: [facility];  Service: General;  Laterality: N/A;   PORTACATH PLACEMENT N/A 07/21/2022   Procedure: INSERTION PORT-A-CATH;  Surgeon: Vanderbilt Ned, MD;  Location: [facility];  Service: General;  Laterality: N/A;  90 MIN ROOM 4-OK'D PER KELLY   SENTINEL NODE BIOPSY Left 07/21/2022   Procedure: LEFT SENTINEL NODE BIOPSY;  Surgeon: Vanderbilt Ned, MD;  Location: [facility];  Service: General;  Laterality: Left;   Family History  Problem Relation Age of Onset   Asthma Mother    Hypertension Mother    Alzheimer's disease Mother    Early death Father    Asthma Sister    Diabetes Sister    Heart disease Sister    Hypertension Sister    Social History   Socioeconomic History   Marital status: Widowed    Spouse name: Not on file   Number of children: Not on file   Years of education: Not on file   Highest education level: 12th grade  Occupational History   Not on file  Tobacco Use   Smoking status: Never   Smokeless tobacco: Never  Vaping Use   Vaping status:  Never Used  Substance and Sexual Activity   Alcohol use: Never   Drug use: Never   Sexual activity: Yes  Other Topics Concern   Not on file  Social History Narrative   Not on file   Social Drivers of Health   Financial Resource Strain: Low Risk  (03/09/2024)   Overall Financial Resource Strain (CARDIA)    Difficulty of Paying Living Expenses: Not hard at all  Food Insecurity: No Food Insecurity (03/09/2024)   Hunger Vital Sign    Worried About Running Out of Food in the Last Year: Never true    Ran Out of Food in the Last Year: Never true  Transportation Needs: No Transportation Needs (03/09/2024)   PRAPARE - Administrator, Civil Service (Medical): No    Lack of Transportation (Non-Medical): No  Physical Activity: Inactive (03/09/2024)  Exercise Vital Sign    Days of Exercise per Week: 0 days    Minutes of Exercise per Session: 0 min  Stress: No Stress Concern Present (03/09/2024)   [employer] of Occupational Health - Occupational Stress Questionnaire    Feeling of Stress: Not at all  Social Connections: Unknown (03/09/2024)   Social Connection and Isolation Panel    Frequency of Communication with Friends and Family: More than three times a week    Frequency of Social Gatherings with Friends and Family: Twice a week    Attends Religious Services: More than 4 times per year    Active Member of [employer] or Organizations: Not on file    Attends Banker Meetings: Not on file    Marital Status: Widowed    Tobacco Counseling Counseling given: Not Answered    Clinical Intake:  Pre-visit preparation completed: Yes  Pain : No/denies pain     Nutritional Risks: None Diabetes: Yes CBG done?: No Did pt. bring in CBG monitor from home?: No  Lab Results  Component Value Date   HGBA1C 7.1 (H) 11/30/2023   HGBA1C 7.3 (H) 07/25/2023   HGBA1C 7.3 (H) 03/15/2023     How often do you need to have someone help you when you read instructions,  pamphlets, or other written materials from your doctor or pharmacy?: 1 - Never  Interpreter Needed?: No  Information entered by :: NAllen LPN   Activities of Daily Living     03/09/2024    4:29 PM  In your present state of health, do you have any difficulty performing the following activities:  Hearing? 0  Vision? 0  Difficulty concentrating or making decisions? 0  Walking or climbing stairs? 0  Dressing or bathing? 0  Doing errands, shopping? 0  Preparing Food and eating ? N  Using the Toilet? N  In the past six months, have you accidently leaked urine? N  Do you have problems with loss of bowel control? N  Managing your Medications? N  Managing your Finances? N  Housekeeping or managing your Housekeeping? N    Patient Care Team: Jarold Medici, MD as PCP - General (Internal Medicine) Odean Potts, MD as Consulting Physician (Hematology and Oncology) Vanderbilt Ned, MD as Consulting Physician (General Surgery) Dewey Rush, MD as Consulting Physician (Radiation Oncology) Jolee Cassius PARAS, [facility] as Pharmacist (Pharmacist)  I have updated your Care Teams any recent Medical Services you may have received from other providers in the past year.     Assessment:   This is a routine wellness examination for Nicole Nunez.  Hearing/Vision screen Hearing Screening - Comments:: Denies hearing issues Vision Screening - Comments:: Regular eye exams, Dr. Cheree   Goals Addressed             This Visit's Progress    Patient Stated       03/13/2024, wants to start exercising again       Depression Screen     03/13/2024    9:42 AM 07/25/2023    9:25 AM 03/15/2023    3:08 PM 03/02/2023    8:34 AM 02/10/2022    9:11 AM 12/31/2020    8:46 AM 01/29/2020   11:43 AM  PHQ 2/9 Scores  PHQ - 2 Score 0 0 0 0 0 0 0  PHQ- 9 Score 0 0 0 0   0    Fall Risk     03/09/2024    4:29 PM 11/30/2023    8:42 AM 07/25/2023  9:25 AM 03/15/2023    3:08 PM 02/26/2023    8:11 PM  Fall Risk    Falls in the past year? 0 0 0 0 0  Number falls in past yr: 0 0 0 0 0  Injury with Fall? 0 0 0 0 1  Risk for fall due to : Medication side effect No Fall Risks No Fall Risks No Fall Risks Medication side effect  Follow up Falls prevention discussed;Falls evaluation completed Falls evaluation completed Falls evaluation completed Falls evaluation completed Falls prevention discussed;Falls evaluation completed    MEDICARE RISK AT HOME:  Medicare Risk at Home Any stairs in or around the home?: (Patient-Rptd) No Home free of loose throw rugs in walkways, pet beds, electrical cords, etc?: (Patient-Rptd) Yes Adequate lighting in your home to reduce risk of falls?: (Patient-Rptd) Yes Life alert?: (Patient-Rptd) No Use of a cane, walker or w/c?: (Patient-Rptd) No Grab bars in the bathroom?: (Patient-Rptd) No Shower chair or bench in shower?: (Patient-Rptd) No Elevated toilet seat or a handicapped toilet?: (Patient-Rptd) No  TIMED UP AND GO:  Was the test performed?  No  Cognitive Function: 6CIT completed        03/13/2024    9:42 AM 03/02/2023    8:35 AM 02/10/2022    9:12 AM 12/31/2020    8:47 AM 01/29/2020   11:45 AM  6CIT Screen  What Year? 0 points 0 points 0 points 0 points 0 points  What month? 0 points 0 points 0 points 0 points 0 points  What time? 0 points 0 points 0 points 0 points 0 points  Count back from 20 0 points 0 points 0 points 0 points 0 points  Months in reverse 0 points 0 points 0 points 0 points 0 points  Repeat phrase 2 points 0 points 0 points 0 points 0 points  Total Score 2 points 0 points 0 points 0 points 0 points    Immunizations Immunization History  Administered Date(s) Administered   DTaP 06/17/2011   Fluad Quad(high Dose 65+) 03/19/2019, 01/29/2020, 02/04/2021, 02/17/2022   Fluad Trivalent(High Dose 65+) 03/15/2023   Influenza-Unspecified 02/06/2018   PFIZER(Purple Top)SARS-COV-2 Vaccination 07/08/2019, 07/30/2019, 05/10/2020    Pfizer(Comirnaty)Fall Seasonal Vaccine 12 years and older 03/15/2023   Pneumococcal Conjugate-13 05/07/2014   Pneumococcal Polysaccharide-23 02/28/2013   Pneumococcal-Unspecified 02/25/2013, 05/07/2015   Tdap 09/21/2021   Zoster Recombinant(Shingrix ) 06/23/2021, 08/24/2021   Zoster, Live 04/30/2013    Screening Tests Health Maintenance  Topic Date Due   Influenza Vaccine  12/22/2023   COVID-19 Vaccine (5 - 2025-26 season) 01/22/2024   Diabetic kidney evaluation - Urine ACR  03/14/2024   FOOT EXAM  03/14/2024   Mammogram  05/24/2024   HEMOGLOBIN A1C  06/01/2024   OPHTHALMOLOGY EXAM  11/22/2024   Diabetic kidney evaluation - eGFR measurement  11/29/2024   Medicare Annual Wellness (AWV)  03/13/2025   DTaP/Tdap/Td (3 - Td or Tdap) 09/22/2031   Pneumococcal Vaccine: 50+ Years  Completed   DEXA SCAN  Completed   Hepatitis C Screening  Completed   Zoster Vaccines- Shingrix   Completed   Meningococcal B Vaccine  Aged Out   Colonoscopy  Discontinued    Health Maintenance Items Addressed: Vaccines Due: flu and covid  Additional Screening:  Vision Screening: Recommended annual ophthalmology exams for early detection of glaucoma and other disorders of the eye. Is the patient up to date with their annual eye exam?  Yes  Who is the provider or what is the name of the office in  which the patient attends annual eye exams? Dr. Cheree  Dental Screening: Recommended annual dental exams for proper oral hygiene  Community Resource Referral / Chronic Care Management: CRR required this visit?  No   CCM required this visit?  No   Plan:    I have personally reviewed and noted the following in the patient's chart:   Medical and social history Use of alcohol, tobacco or illicit drugs  Current medications and supplements including opioid prescriptions. Patient is not currently taking opioid prescriptions. Functional ability and status Nutritional status Physical activity Advanced  directives List of other physicians Hospitalizations, surgeries, and ER visits in previous 12 months Vitals Screenings to include cognitive, depression, and falls Referrals and appointments  In addition, I have reviewed and discussed with patient certain preventive protocols, quality metrics, and best practice recommendations. A written personalized care plan for preventive services as well as general preventive health recommendations were provided to patient.   Ardella FORBES Dawn, LPN   [id number]   After Visit Summary: (MyChart) Due to this being a telephonic visit, the after visit summary with patients personalized plan was offered to patient via MyChart   Notes: Nothing significant to report at this time.

## 2024-03-13 NOTE — Patient Instructions (Signed)
 Nicole Nunez,  Thank you for taking the time for your Medicare Wellness Visit. I appreciate your continued commitment to your health goals. Please review the care plan we discussed, and feel free to reach out if I can assist you further.  Medicare recommends these wellness visits once per year to help you and your care team stay ahead of potential health issues. These visits are designed to focus on prevention, allowing your provider to concentrate on managing your acute and chronic conditions during your regular appointments.  Please note that Annual Wellness Visits do not include a physical exam. Some assessments may be limited, especially if the visit was conducted virtually. If needed, we may recommend a separate in-person follow-up with your provider.  Ongoing Care Seeing your primary care provider every 3 to 6 months helps us  monitor your health and provide consistent, personalized care.   Referrals If a referral was made during today's visit and you haven't received any updates within two weeks, please contact the referred provider directly to check on the status.  Recommended Screenings:  Health Maintenance  Topic Date Due   Flu Shot  12/22/2023   COVID-19 Vaccine (5 - 2025-26 season) 01/22/2024   Yearly kidney health urinalysis for diabetes  03/14/2024   Complete foot exam   03/14/2024   Breast Cancer Screening  05/24/2024   Hemoglobin A1C  06/01/2024   Eye exam for diabetics  11/22/2024   Yearly kidney function blood test for diabetes  11/29/2024   Medicare Annual Wellness Visit  03/13/2025   DTaP/Tdap/Td vaccine (3 - Td or Tdap) 09/22/2031   Pneumococcal Vaccine for age over 33  Completed   DEXA scan (bone density measurement)  Completed   Hepatitis C Screening  Completed   Zoster (Shingles) Vaccine  Completed   Meningitis B Vaccine  Aged Out   Colon Cancer Screening  Discontinued       10/26/2023    8:54 AM  Advanced Directives  Does Patient Have a Medical Advance  Directive? No  Would patient like information on creating a medical advance directive? Yes (MAU/Ambulatory/Procedural Areas - Information given)   Advance Care Planning is important because it: Ensures you receive medical care that aligns with your values, goals, and preferences. Provides guidance to your family and loved ones, reducing the emotional burden of decision-making during critical moments.  Vision: Annual vision screenings are recommended for early detection of glaucoma, cataracts, and diabetic retinopathy. These exams can also reveal signs of chronic conditions such as diabetes and high blood pressure.  Dental: Annual dental screenings help detect early signs of oral cancer, gum disease, and other conditions linked to overall health, including heart disease and diabetes.  Please see the attached documents for additional preventive care recommendations.

## 2024-03-17 ENCOUNTER — Other Ambulatory Visit: Payer: Self-pay | Admitting: Internal Medicine

## 2024-03-27 ENCOUNTER — Ambulatory Visit (INDEPENDENT_AMBULATORY_CARE_PROVIDER_SITE_OTHER): Payer: Medicare Other | Admitting: Internal Medicine

## 2024-03-27 ENCOUNTER — Encounter: Payer: Medicare Other | Admitting: Internal Medicine

## 2024-03-27 ENCOUNTER — Encounter: Payer: Self-pay | Admitting: Internal Medicine

## 2024-03-27 ENCOUNTER — Ambulatory Visit: Payer: Self-pay | Admitting: Internal Medicine

## 2024-03-27 VITALS — BP 122/80 | HR 80 | Temp 98.4°F | Ht <= 58 in | Wt 144.2 lb

## 2024-03-27 DIAGNOSIS — Z23 Encounter for immunization: Secondary | ICD-10-CM

## 2024-03-27 DIAGNOSIS — Z0000 Encounter for general adult medical examination without abnormal findings: Secondary | ICD-10-CM

## 2024-03-27 DIAGNOSIS — Z170 Estrogen receptor positive status [ER+]: Secondary | ICD-10-CM

## 2024-03-27 DIAGNOSIS — N1831 Chronic kidney disease, stage 3a: Secondary | ICD-10-CM

## 2024-03-27 DIAGNOSIS — C50512 Malignant neoplasm of lower-outer quadrant of left female breast: Secondary | ICD-10-CM

## 2024-03-27 DIAGNOSIS — I2510 Atherosclerotic heart disease of native coronary artery without angina pectoris: Secondary | ICD-10-CM

## 2024-03-27 DIAGNOSIS — I2584 Coronary atherosclerosis due to calcified coronary lesion: Secondary | ICD-10-CM

## 2024-03-27 DIAGNOSIS — E1122 Type 2 diabetes mellitus with diabetic chronic kidney disease: Secondary | ICD-10-CM | POA: Diagnosis not present

## 2024-03-27 DIAGNOSIS — E113211 Type 2 diabetes mellitus with mild nonproliferative diabetic retinopathy with macular edema, right eye: Secondary | ICD-10-CM

## 2024-03-27 DIAGNOSIS — I1310 Hypertensive heart and chronic kidney disease without heart failure, with stage 1 through stage 4 chronic kidney disease, or unspecified chronic kidney disease: Secondary | ICD-10-CM

## 2024-03-27 DIAGNOSIS — E7800 Pure hypercholesterolemia, unspecified: Secondary | ICD-10-CM

## 2024-03-27 LAB — POCT URINALYSIS DIPSTICK
Bilirubin, UA: NEGATIVE
Blood, UA: NEGATIVE
Glucose, UA: POSITIVE — AB
Leukocytes, UA: NEGATIVE
Nitrite, UA: NEGATIVE
Protein, UA: NEGATIVE
Spec Grav, UA: >=1.03 — AB (ref 1.010–1.025)
Urobilinogen, UA: 0.2 U/dL
pH, UA: 5.5 (ref 5.0–8.0)

## 2024-03-27 NOTE — Assessment & Plan Note (Signed)
 Chronic, LDL goal is less than 70. CT shows arterial plaque, she denies experiencing any angina or dyspnea. - Order echocardiogram. - continue with statin therapy - resume regular exercise, aiming for at least 150 minutes/week - take ASA 81mg  daily

## 2024-03-27 NOTE — Assessment & Plan Note (Signed)
 Chronic, encouraged to keep regular ophthalmology appts.  - Keep BS at optimal levels to decrease risk of progression.

## 2024-03-27 NOTE — Assessment & Plan Note (Signed)
Chronic, LDL goal is less than 70.  She will continue with pitavastatin 4mg  daily. Encouraged to follow a heart healthy lifestyle.

## 2024-03-27 NOTE — Progress Notes (Signed)
 I,Victoria T Emmitt, CMA,acting as a neurosurgeon for Catheryn LOISE Slocumb, MD.,have documented all relevant documentation on the behalf of Catheryn LOISE Slocumb, MD,as directed by  Catheryn LOISE Slocumb, MD while in the presence of Catheryn LOISE Slocumb, MD.  Subjective:    Patient ID: Nicole Nunez , female    DOB: 02-12-1946 , 78 y.o.   MRN: [id number]  Chief Complaint  Patient presents with   Annual Exam    Patient here for annual exam. Patient stated she no longer goes to a GYN. She reports compliance with meds. She denies headaches, chest pain and shortness of breath. She has no specific concerns or complaints at this time.    Diabetes   Hypertension   Hyperlipidemia    HPI Discussed the use of AI scribe software for clinical note transcription with the patient, who gave verbal consent to proceed.  History of Present Illness Nicole Nunez is a 78 year old female with diabetes who presents for a physical and diabetes check.  She has experienced elevated blood sugar levels recently, which she attributes to dietary indiscretions and lack of exercise. Her blood sugar levels were normal in October but have been high over the past three days. She has been eating later in the evening, around 7 PM, due to helping her daughter with tasks, which may be contributing to the higher readings. She checks her blood sugar at 5:15 AM before taking her son to work, and the readings have been high during this time.  She is currently taking Farxiga  10 mg, olmesartan  20 mg, pantoprazole  40 mg, and pitavastatin  5 mg. Additionally, she takes anastrozole  due to a history of breast cancer, for which she underwent a lumpectomy and radiation therapy.  Her social situation involves helping her daughter and taking her son to work. Her son, who has a history of drug problems, recently started a new job and is working to save for a car. She plans to assist him with transportation until he can purchase a vehicle by the end of December.  She denies any issues with sleep and feels she gets enough rest despite her busy schedule.  No shortness of breath and no history of smoking. She reports regular bowel movements, stating she uses the bathroom daily. She has not been consistent with exercise but intends to resume her routine, mentioning she has access to a [employer] for workouts.   Diabetes She presents for her follow-up diabetic visit. She has type 2 diabetes mellitus. Her disease course has been stable. There are no hypoglycemic associated symptoms. Pertinent negatives for diabetes include no blurred vision. There are no hypoglycemic complications. She is following a diabetic diet. She has not had a previous visit with a dietitian. She participates in exercise three times a week. An ACE inhibitor/angiotensin II receptor blocker is being taken. Eye exam is current.  Hypertension This is a chronic problem. The current episode started more than 1 year ago. The problem has been gradually improving since onset. The problem is controlled. Pertinent negatives include no blurred vision. The current treatment provides moderate improvement. Hypertensive end-organ damage includes kidney disease.     Past Medical History:  Diagnosis Date   Arthritis    Breast cancer (HCC) 05/18/2022   CKD stage 3 due to type 2 diabetes mellitus (HCC)    DM (diabetes mellitus) (HCC)    GERD (gastroesophageal reflux disease)    High cholesterol    HTN (hypertension)      Family History  Problem  Relation Age of Onset   Asthma Mother    Hypertension Mother    Alzheimer's disease Mother    Early death Father    Asthma Sister    Diabetes Sister    Heart disease Sister    Hypertension Sister      Current Outpatient Medications:    anastrozole  (ARIMIDEX ) 1 MG tablet, TAKE 1 TABLET BY MOUTH EVERY DAY, Disp: 90 tablet, Rfl: 3   Ascorbic Acid (VITAMIN C) 1000 MG tablet, Take 1,000 mg by mouth daily., Disp: , Rfl:    aspirin EC 81 MG tablet, Take 81 mg by  mouth daily., Disp: , Rfl:    b complex vitamins tablet, Take 1 tablet by mouth daily., Disp: , Rfl:    Calcium  Carbonate (CALCIUM  600 PO), Take 600 mg by mouth daily., Disp: , Rfl:    Cholecalciferol (VITAMIN D3) 125 MCG (5000 UT) CAPS, Take 5,000 Units by mouth daily., Disp: , Rfl:    dapagliflozin  propanediol (FARXIGA ) 10 MG TABS tablet, Take 1 tablet (10 mg total) by mouth daily., Disp: 90 tablet, Rfl: 2   Lancet Devices (ONE TOUCH DELICA LANCING DEV) MISC, USE AS DIRECTED TO CHECK BLOOD SUGARS ONCE DAILY dx: e11.22, Disp: 1 each, Rfl: 2   Lancets (ONETOUCH DELICA PLUS LANCET33G) MISC, USE DAILY AS DIRECTED, Disp: 100 each, Rfl: 2   loratadine  (CLARITIN ) 10 MG tablet, Take 1 tablet (10 mg total) by mouth daily., Disp: 90 tablet, Rfl: 1   magnesium  gluconate (MAGONATE) 500 MG tablet, Take 500 mg by mouth daily., Disp: , Rfl:    Multiple Vitamin (MULTIVITAMIN) tablet, Take 1 tablet by mouth daily., Disp: , Rfl:    olmesartan  (BENICAR ) 20 MG tablet, TAKE 1 TABLET BY MOUTH DAILY, Disp: 100 tablet, Rfl: 2   ONETOUCH VERIO test strip, USE TO CHECK BLOOD SUGAR DAILY, Disp: 100 strip, Rfl: 2   pantoprazole  (PROTONIX ) 40 MG tablet, TAKE 1 TABLET BY MOUTH DAILY, Disp: 100 tablet, Rfl: 2   Pitavastatin  Magnesium  (ZYPITAMAG ) 4 MG TABS, TAKE 1 TABLET BY MOUTH EVERY DAY, Disp: 30 tablet, Rfl: 2   Semaglutide ,0.25 or 0.5MG /DOS, (OZEMPIC , 0.25 OR 0.5 MG/DOSE,) 2 MG/3ML SOPN, INJECT 0.5MG  SUBCUTANEOUSLY WEEKLY., Disp: 3 mL, Rfl: 1   zinc gluconate 50 MG tablet, Take 50 mg by mouth daily., Disp: , Rfl:    Allergies  Allergen Reactions   Statins Other (See Comments)    Rosuvastatin, atorvastatin, pravastatin, simvastatin muscle aching       The patient states she uses post menopausal status for birth control. No LMP recorded. Patient is postmenopausal.. Negative for Dysmenorrhea. Negative for: breast discharge, breast lump(s), breast pain and breast self exam. Associated symptoms include abnormal vaginal  bleeding. Pertinent negatives include abnormal bleeding (hematology), anxiety, decreased libido, depression, difficulty falling sleep, dyspareunia, history of infertility, nocturia, sexual dysfunction, sleep disturbances, urinary incontinence, urinary urgency, vaginal discharge and vaginal itching. Diet regular.The patient states her exercise level is  intermittent.  . The patient's tobacco use is:  Social History   Tobacco Use  Smoking Status Never  Smokeless Tobacco Never  . She has been exposed to passive smoke. The patient's alcohol use is:  Social History   Substance and Sexual Activity  Alcohol Use Never    Review of Systems  Constitutional: Negative.   HENT: Negative.    Eyes: Negative.  Negative for blurred vision.  Respiratory: Negative.    Cardiovascular: Negative.   Gastrointestinal: Negative.   Endocrine: Negative.   Genitourinary: Negative.   Musculoskeletal: Negative.  Skin: Negative.   Allergic/Immunologic: Negative.   Neurological: Negative.   Hematological: Negative.   Psychiatric/Behavioral: Negative.       Today's Vitals   03/27/24 0840  BP: 122/80  Pulse: 80  Temp: 98.4 F (36.9 C)  SpO2: 98%  Weight: 144 lb 3.2 oz (65.4 kg)  Height: 4' 10 (1.473 m)   Body mass index is 30.14 kg/m.  Wt Readings from Last 3 Encounters:  03/27/24 144 lb 3.2 oz (65.4 kg)  02/12/24 143 lb 4 oz (65 kg)  11/30/23 150 lb (68 kg)     Objective:  Physical Exam Vitals and nursing note reviewed.  Constitutional:      Appearance: Normal appearance. She is obese.  HENT:     Head: Normocephalic and atraumatic.     Right Ear: Tympanic membrane, ear canal and external ear normal.     Left Ear: Tympanic membrane, ear canal and external ear normal.     Nose: Nose normal.     Mouth/Throat:     Mouth: Mucous membranes are moist.     Pharynx: Oropharynx is clear.  Eyes:     Extraocular Movements: Extraocular movements intact.     Conjunctiva/sclera: Conjunctivae  normal.     Pupils: Pupils are equal, round, and reactive to light.  Cardiovascular:     Rate and Rhythm: Normal rate and regular rhythm.     Pulses: Normal pulses.          Dorsalis pedis pulses are 2+ on the right side and 2+ on the left side.     Heart sounds: Normal heart sounds.  Pulmonary:     Effort: Pulmonary effort is normal.     Breath sounds: Normal breath sounds.  Chest:  Breasts:    Tanner Score is 5.     Right: Normal.     Comments: Skin thickening left breast, healed surgical scar left breast/left axilla  Healed surgical scar  R upr anterior chest Abdominal:     General: Abdomen is flat. Bowel sounds are normal.     Palpations: Abdomen is soft.  Genitourinary:    Comments: deferred Musculoskeletal:        General: Normal range of motion.     Cervical back: Normal range of motion and neck supple.  Feet:     Right foot:     Protective Sensation: 5 sites tested.  5 sites sensed.     Skin integrity: Dry skin present.     Toenail Condition: Right toenails are normal.     Left foot:     Protective Sensation: 5 sites tested.  5 sites sensed.     Skin integrity: Dry skin present.     Toenail Condition: Left toenails are normal.  Skin:    General: Skin is warm and dry.  Neurological:     General: No focal deficit present.     Mental Status: She is alert and oriented to person, place, and time.  Psychiatric:        Mood and Affect: Mood normal.        Behavior: Behavior normal.         Assessment And Plan:     Encounter for general adult medical examination w/o abnormal findings Assessment & Plan: A full exam was performed.  Importance of monthly self breast exams was discussed with the patient.  She is advised to get 30-45 minutes of regular exercise, no less than four to five days per week. Both weight-bearing and aerobic exercises are  recommended.  She is advised to follow a healthy diet with at least six fruits/veggies per day, decrease intake of red meat  and other saturated fats and to increase fish intake to twice weekly.  Meats/fish should not be fried -- baked, boiled or broiled is preferable. It is also important to cut back on your sugar intake.  Be sure to read labels - try to avoid anything with added sugar, high fructose corn syrup or other sweeteners.  If you must use a sweetener, you can try stevia or monkfruit.  It is also important to avoid artificially sweetened foods/beverages and diet drinks. Lastly, wear SPF 50 sunscreen on exposed skin and when in direct sunlight for an extended period of time.  Be sure to avoid fast food restaurants and aim for at least 60 ounces of water  daily.       Type 2 diabetes mellitus with stage 3a chronic kidney disease, without long-term current use of insulin (HCC) Assessment & Plan: Chronic, diabetic foot exam was performed.  Elevated blood sugar due to dietary indiscretions and lack of exercise. Inconsistent readings possibly due to meter issues. - Encouraged adherence to dietary and exercise regimen. - Scheduled four-month diabetes check. - Chronic, she is encouraged to stay well hydrated, avoid NSAIDs and keep BP controlled to prevent progression of CKD.   - I DISCUSSED WITH THE PATIENT AT LENGTH REGARDING THE GOALS OF GLYCEMIC CONTROL AND POSSIBLE LONG-TERM COMPLICATIONS.  I  ALSO STRESSED THE IMPORTANCE OF COMPLIANCE WITH HOME GLUCOSE MONITORING, DIETARY RESTRICTIONS INCLUDING AVOIDANCE OF SUGARY DRINKS/PROCESSED FOODS,  ALONG WITH REGULAR EXERCISE.  I  ALSO STRESSED THE IMPORTANCE OF ANNUAL EYE EXAMS, SELF FOOT CARE AND COMPLIANCE WITH OFFICE VISITS.   Orders: -     CBC -     CMP14+EGFR -     Lipid panel -     Hemoglobin A1c  Mild nonproliferative diabetic retinopathy of right eye with macular edema associated with type 2 diabetes mellitus (HCC) Assessment & Plan: Chronic, encouraged to keep regular ophthalmology appts.  - Keep BS at optimal levels to decrease risk of  progression.   Hypertensive heart and renal disease with renal failure, stage 1 through stage 4 or unspecified chronic kidney disease, without heart failure Assessment & Plan: Chronic, controlled. EKG performed. NSR w/ low voltage.  She is encouraged to follow a low sodium diet.  - Continue with olmesartan  20mg  daily.  - ARB therapy is important due to concomitant DM diagnosis.   Orders: -     CMP14+EGFR -     Lipid panel -     POCT urinalysis dipstick -     Microalbumin / creatinine urine ratio -     EKG 12-Lead  Coronary artery calcification of native artery Assessment & Plan: Chronic, LDL goal is less than 70. CT shows arterial plaque, she denies experiencing any angina or dyspnea. - Order echocardiogram. - continue with statin therapy - resume regular exercise, aiming for at least 150 minutes/week - take ASA 81mg  daily  Orders: -     CMP14+EGFR -     Lipid panel  Pure hypercholesterolemia Assessment & Plan: Chronic, LDL goal is less than 70.  She will continue with pitavastatin  4mg  daily. Encouraged to follow a heart healthy lifestyle.   Orders: -     CMP14+EGFR -     Lipid panel  Malignant neoplasm of lower-outer quadrant of left breast of female, estrogen receptor positive (HCC) Assessment & Plan: She is s/p left lumpectomy. She is  s/p adjuvant chemotherapy with Taxotere  and Cytoxan , completed April 2024.  XRT completed July 2024.  - currently on anastrozole .  - Follow up with oncologist as scheduled.   Immunization due -     Flu vaccine HIGH DOSE PF(Fluzone Trivalent)    Return for 1 year hm, 4 month dm f/u.Nicole Nunez Patient was given opportunity to ask questions. Patient verbalized understanding of the plan and was able to repeat key elements of the plan. All questions were answered to their satisfaction.   I, Catheryn LOISE Slocumb, MD, have reviewed all documentation for this visit. The documentation on 03/27/24 for the exam, diagnosis, procedures, and orders are all  accurate and complete.

## 2024-03-27 NOTE — Assessment & Plan Note (Signed)

## 2024-03-27 NOTE — Assessment & Plan Note (Signed)
 She is s/p left lumpectomy. She is s/p adjuvant chemotherapy with Taxotere  and Cytoxan , completed April 2024.  XRT completed July 2024.  - currently on anastrozole .  - Follow up with oncologist as scheduled.

## 2024-03-27 NOTE — Assessment & Plan Note (Signed)
 Chronic, controlled. EKG performed. NSR w/ low voltage.  She is encouraged to follow a low sodium diet.  - Continue with olmesartan  20mg  daily.  - ARB therapy is important due to concomitant DM diagnosis.

## 2024-03-27 NOTE — Assessment & Plan Note (Addendum)
 Chronic, diabetic foot exam was performed.  Elevated blood sugar due to dietary indiscretions and lack of exercise. Inconsistent readings possibly due to meter issues. - Encouraged adherence to dietary and exercise regimen. - Scheduled four-month diabetes check. - Chronic, she is encouraged to stay well hydrated, avoid NSAIDs and keep BP controlled to prevent progression of CKD.   - I DISCUSSED WITH THE PATIENT AT LENGTH REGARDING THE GOALS OF GLYCEMIC CONTROL AND POSSIBLE LONG-TERM COMPLICATIONS.  I  ALSO STRESSED THE IMPORTANCE OF COMPLIANCE WITH HOME GLUCOSE MONITORING, DIETARY RESTRICTIONS INCLUDING AVOIDANCE OF SUGARY DRINKS/PROCESSED FOODS,  ALONG WITH REGULAR EXERCISE.  I  ALSO STRESSED THE IMPORTANCE OF ANNUAL EYE EXAMS, SELF FOOT CARE AND COMPLIANCE WITH OFFICE VISITS.

## 2024-03-27 NOTE — Patient Instructions (Signed)

## 2024-03-28 ENCOUNTER — Ambulatory Visit (HOSPITAL_COMMUNITY)
Admission: RE | Admit: 2024-03-28 | Discharge: 2024-03-28 | Disposition: A | Discharge status: Home / Self Care | Source: Ambulatory Visit | Attending: Hematology and Oncology | Admitting: Hematology and Oncology

## 2024-03-28 DIAGNOSIS — C50512 Malignant neoplasm of lower-outer quadrant of left female breast: Secondary | ICD-10-CM | POA: Diagnosis present

## 2024-03-28 DIAGNOSIS — Z170 Estrogen receptor positive status [ER+]: Secondary | ICD-10-CM | POA: Diagnosis present

## 2024-03-28 LAB — CMP14+EGFR
ALT: 13 IU/L (ref 0–32)
AST: 20 IU/L (ref 0–40)
Albumin: 4.5 g/dL (ref 3.8–4.8)
Alkaline Phosphatase: 91 IU/L (ref 49–135)
BUN/Creatinine Ratio: 19 (ref 12–28)
BUN: 25 mg/dL (ref 8–27)
Bilirubin Total: 0.6 mg/dL (ref 0.0–1.2)
CO2: 25 mmol/L (ref 20–29)
Calcium: 9.6 mg/dL (ref 8.7–10.3)
Chloride: 102 mmol/L (ref 96–106)
Creatinine, Ser: 1.3 mg/dL — ABNORMAL HIGH (ref 0.57–1.00)
Globulin, Total: 2.7 g/dL (ref 1.5–4.5)
Glucose: 130 mg/dL — ABNORMAL HIGH (ref 70–99)
Potassium: 4.4 mmol/L (ref 3.5–5.2)
Sodium: 141 mmol/L (ref 134–144)
Total Protein: 7.2 g/dL (ref 6.0–8.5)
eGFR: 43 mL/min/1.73 — ABNORMAL LOW (ref >59)

## 2024-03-28 LAB — LIPID PANEL
Chol/HDL Ratio: 2.4 ratio (ref 0.0–4.4)
Cholesterol, Total: 161 mg/dL (ref 100–199)
HDL: 68 mg/dL (ref >39)
LDL Chol Calc (NIH): 80 mg/dL (ref 0–99)
Triglycerides: 66 mg/dL (ref 0–149)
VLDL Cholesterol Cal: 13 mg/dL (ref 5–40)

## 2024-03-28 LAB — CBC
Hematocrit: 38.9 % (ref 34.0–46.6)
Hemoglobin: 12.6 g/dL (ref 11.1–15.9)
MCH: 28.3 pg (ref 26.6–33.0)
MCHC: 32.4 g/dL (ref 31.5–35.7)
MCV: 87 fL (ref 79–97)
Platelets: 245 x10E3/uL (ref 150–450)
RBC: 4.45 x10E6/uL (ref 3.77–5.28)
RDW: 13.7 % (ref 11.7–15.4)
WBC: 5 x10E3/uL (ref 3.4–10.8)

## 2024-03-28 LAB — MICROALBUMIN / CREATININE URINE RATIO
Creatinine, Urine: 166.6 mg/dL
Microalb/Creat Ratio: 3 mg/g{creat} (ref 0–29)
Microalbumin, Urine: 4.9 ug/mL

## 2024-03-28 LAB — HEMOGLOBIN A1C
Est. average glucose Bld gHb Est-mCnc: 154 mg/dL
Hgb A1c MFr Bld: 7 % — ABNORMAL HIGH (ref 4.8–5.6)

## 2024-03-28 LAB — POCT I-STAT CREATININE: Creatinine, Ser: 1.3 mg/dL — ABNORMAL HIGH (ref 0.44–1.00)

## 2024-03-28 MED ORDER — IOHEXOL 300 MG/ML  SOLN
100.0000 mL | Freq: Once | INTRAMUSCULAR | Status: AC | PRN
  Administered 2024-03-28: 80 mL via INTRAVENOUS

## 2024-04-04 ENCOUNTER — Inpatient Hospital Stay: Attending: Hematology and Oncology | Admitting: Hematology and Oncology

## 2024-04-04 VITALS — BP 139/63 | HR 83 | Temp 98.5°F | Resp 16 | Ht <= 58 in | Wt 145.6 lb

## 2024-04-04 DIAGNOSIS — C50512 Malignant neoplasm of lower-outer quadrant of left female breast: Secondary | ICD-10-CM | POA: Diagnosis not present

## 2024-04-04 DIAGNOSIS — Z170 Estrogen receptor positive status [ER+]: Secondary | ICD-10-CM | POA: Diagnosis not present

## 2024-04-04 NOTE — Assessment & Plan Note (Signed)
 05/18/2022: Screening mammogram detected left breast mass by ultrasound it measured 9 mm, biopsy revealed grade 2 IDC ER 90%, PR 15%, Ki-67 50%, HER2 2+ by IHC, FISH negative ratio 2.17, Copy #3.9, additional 20 cells were examined ratio came at 2.19 and copy #3.72    06/21/2022: Left lumpectomy: Grade 3 IDC 1.5 cm, margins negative, no lymph node (adipose tissue was in the sentinel node) ER 90% weak to moderate, PR 15% weak to strong, HER2 negative, Ki-67 50% Repeat prognostic panel: ER 0%, PR 0%, Ki-67 35%, HER2 2+ by IHC FISH negative ratio 1.72 (triple negative breast cancer)   Treatment plan: Oncotype DX testing was canceled because the cancer was triple negative.  Recommend adjuvant chemotherapy with Taxotere  and Cytoxan  every 3 weeks x 3 completed 09/20/2022 Adjuvant radiation therapy completed 11/23/2022 -----------------------------------------------------------------------------------------------------------------------------------  Current treatment: Surveillance Signatera testing:  03/15/2023: +0.02 08/28/2023: +1.83 02/25/2024: 0.75   CT CAP 09/25/2023: Few tiny lung nodules 5 mm in size unchanged.  No evidence of metastatic disease. Bone scan 09/20/2023: No evidence of metastatic disease. Mammogram 05/25/2023: Benign breast density category B  CT CAP 03/28/2024: Tiny pulmonary nodules unchanged most likely benign and incidental.   Recommendation:  Continue Signatera testing and monitoring.  Every 3 months Current treatment: Anastrozole  started May 2025 Anastrozole  toxicities: Continues to have intermittent hot flashes and joint symptoms which were present even at baseline.   Return to clinic in 6 months after scans

## 2024-04-04 NOTE — Progress Notes (Signed)
 Patient Care Team: Jarold Medici, MD as PCP - General (Internal Medicine) Odean Potts, MD as Consulting Physician (Hematology and Oncology) Vanderbilt Ned, MD as Consulting Physician (General Surgery) Dewey Rush, MD as Consulting Physician (Radiation Oncology) Jolee Cassius PARAS, [facility] as Pharmacist (Pharmacist)  DIAGNOSIS:  Encounter Diagnosis  Name Primary?   Malignant neoplasm of lower-outer quadrant of left breast of female, estrogen receptor positive (HCC) Yes    SUMMARY OF ONCOLOGIC HISTORY: Oncology History  Malignant neoplasm of lower-outer quadrant of left breast of female, estrogen receptor positive (HCC)  05/18/2022 Initial Diagnosis   05/18/2022: Screening mammogram detected left breast mass by ultrasound it measured 9 mm, biopsy revealed grade 2 IDC ER 90%, PR 15%, Ki-67 50%, HER2 2+ by IHC, FISH negative ratio 2.17, Copy #3.9, additional 20 cells were examined ratio came at 2.19 and copy #3.72   06/05/2022 Cancer Staging   Staging form: Breast, AJCC 8th Edition - Clinical: Stage IA (cT1b, cN0, cM0, G2, ER+, PR+, HER2-) - Signed by Lanell Donald Stagger, PA-C on 06/05/2022 Method of lymph node assessment: Clinical Histologic grading system: 3 grade system   06/21/2022 Surgery   Left lumpectomy: Grade 3 IDC 1.5 cm, margins negative, no lymph node (adipose tissue was in the sentinel node) ER 90% weak to moderate, PR 15% weak to strong, HER2 negative, Ki-67 50% Repeat prognostic panel: ER 0%, PR 0%, Ki-67 35%, HER2 2+ by IHC FISH negative ratio 1.72 (triple negative breast cancer)   08/09/2022 - 09/22/2022 Chemotherapy   Patient is on Treatment Plan : BREAST TC q21d     10/10/2022 Cancer Staging   Staging form: Breast, AJCC 8th Edition - Pathologic stage from 10/10/2022: Stage IB (pT1c, pN0, cM0, G3, ER-, PR-, HER2-) - Signed by Lanell Donald Stagger, PA-C on 10/10/2022 Histologic grading system: 3 grade system   10/27/2022 - 11/23/2022 Radiation Therapy   Plan Name:  Breast_L_BH Site: Breast, Left Technique: 3D Mode: Photon Dose Per Fraction: 2.66 Gy Prescribed Dose (Delivered / Prescribed): 42.56 Gy / 42.56 Gy Prescribed Fxs (Delivered / Prescribed): 16 / 16   Plan Name: Brst_L_Bst_BH Site: Breast, Left Technique: 3D Mode: Photon Dose Per Fraction: 2 Gy Prescribed Dose (Delivered / Prescribed): 8 Gy / 8 Gy Prescribed Fxs (Delivered / Prescribed): 4 / 4     CHIEF COMPLIANT: Follow-up to discuss results of recent scans  HISTORY OF PRESENT ILLNESS:   History of Present Illness Nicole Nunez is a 78 year old female who presents for follow-up and monitoring of lung nodules.  She has been on anastrozole  since July of the previous year and experiences manageable hot flashes, which are more challenging during the summer. Recent CT scans noted small lung nodules. The Signatera test, last done on October 5th, showed a decrease in the number of nodules. She reports a slight weight gain, currently weighing around 145 pounds, compared to 130 pounds last year. Occasional nausea is attributed to acid reflux, with no other side effects from anastrozole . She incorporates turmeric into her diet and engages in physical activity, including walking and exercises at the [facility].     ALLERGIES:  is allergic to statins.  MEDICATIONS:  Current Outpatient Medications  Medication Sig Dispense Refill   anastrozole  (ARIMIDEX ) 1 MG tablet TAKE 1 TABLET BY MOUTH EVERY DAY 90 tablet 3   Ascorbic Acid (VITAMIN C) 1000 MG tablet Take 1,000 mg by mouth daily.     aspirin EC 81 MG tablet Take 81 mg by mouth daily.  b complex vitamins tablet Take 1 tablet by mouth daily.     Calcium  Carbonate (CALCIUM  600 PO) Take 600 mg by mouth daily.     Cholecalciferol (VITAMIN D3) 125 MCG (5000 UT) CAPS Take 5,000 Units by mouth daily.     dapagliflozin  propanediol (FARXIGA ) 10 MG TABS tablet Take 1 tablet (10 mg total) by mouth daily. 90 tablet 2   Lancet Devices (ONE TOUCH DELICA  LANCING DEV) MISC USE AS DIRECTED TO CHECK BLOOD SUGARS ONCE DAILY dx: e11.22 1 each 2   Lancets (ONETOUCH DELICA PLUS LANCET33G) MISC USE DAILY AS DIRECTED 100 each 2   loratadine  (CLARITIN ) 10 MG tablet Take 1 tablet (10 mg total) by mouth daily. 90 tablet 1   magnesium  gluconate (MAGONATE) 500 MG tablet Take 500 mg by mouth daily.     Multiple Vitamin (MULTIVITAMIN) tablet Take 1 tablet by mouth daily.     olmesartan  (BENICAR ) 20 MG tablet TAKE 1 TABLET BY MOUTH DAILY 100 tablet 2   ONETOUCH VERIO test strip USE TO CHECK BLOOD SUGAR DAILY 100 strip 2   pantoprazole  (PROTONIX ) 40 MG tablet TAKE 1 TABLET BY MOUTH DAILY 100 tablet 2   Pitavastatin  Magnesium  (ZYPITAMAG ) 4 MG TABS TAKE 1 TABLET BY MOUTH EVERY DAY 30 tablet 2   Semaglutide ,0.25 or 0.5MG /DOS, (OZEMPIC , 0.25 OR 0.5 MG/DOSE,) 2 MG/3ML SOPN INJECT 0.5MG  SUBCUTANEOUSLY WEEKLY. 3 mL 1   zinc gluconate 50 MG tablet Take 50 mg by mouth daily.     No current facility-administered medications for this visit.    PHYSICAL EXAMINATION: ECOG PERFORMANCE STATUS: 1 - Symptomatic but completely ambulatory  There were no vitals filed for this visit. There were no vitals filed for this visit.  Physical Exam MEASUREMENTS: Weight- 145.  (exam performed in the presence of a chaperone)  LABORATORY DATA:  I have reviewed the data as listed    Latest Ref Rng & Units 03/28/2024    8:46 AM 03/27/2024   10:00 AM 11/30/2023    9:07 AM  CMP  Glucose 70 - 99 mg/dL  869  872   BUN 8 - 27 mg/dL  25  21   Creatinine 9.55 - 1.00 mg/dL 8.69  8.69  8.77   Sodium 134 - 144 mmol/L  141  142   Potassium 3.5 - 5.2 mmol/L  4.4  4.4   Chloride 96 - 106 mmol/L  102  102   CO2 20 - 29 mmol/L  25  23   Calcium  8.7 - 10.3 mg/dL  9.6  9.5   Total Protein 6.0 - 8.5 g/dL  7.2  6.8   Total Bilirubin 0.0 - 1.2 mg/dL  0.6  0.5   Alkaline Phos 49 - 135 IU/L  91  101   AST 0 - 40 IU/L  20  23   ALT 0 - 32 IU/L  13  16     Lab Results  Component Value Date    WBC 5.0 03/27/2024   HGB 12.6 03/27/2024   HCT 38.9 03/27/2024   MCV 87 03/27/2024   PLT 245 03/27/2024   NEUTROABS 5.4 09/20/2022    ASSESSMENT & PLAN:  Malignant neoplasm of lower-outer quadrant of left breast of female, estrogen receptor positive (HCC) 05/18/2022: Screening mammogram detected left breast mass by ultrasound it measured 9 mm, biopsy revealed grade 2 IDC ER 90%, PR 15%, Ki-67 50%, HER2 2+ by IHC, FISH negative ratio 2.17, Copy #3.9, additional 20 cells were examined ratio came at 2.19 and  copy #3.72    06/21/2022: Left lumpectomy: Grade 3 IDC 1.5 cm, margins negative, no lymph node (adipose tissue was in the sentinel node) ER 90% weak to moderate, PR 15% weak to strong, HER2 negative, Ki-67 50% Repeat prognostic panel: ER 0%, PR 0%, Ki-67 35%, HER2 2+ by IHC FISH negative ratio 1.72 (triple negative breast cancer)   Treatment plan: Oncotype DX testing was canceled because the cancer was triple negative.  Recommend adjuvant chemotherapy with Taxotere  and Cytoxan  every 3 weeks x 3 completed 09/20/2022 Adjuvant radiation therapy completed 11/23/2022 -----------------------------------------------------------------------------------------------------------------------------------  Current treatment: Surveillance Signatera testing:  03/15/2023: +0.02 08/28/2023: +1.83 02/25/2024: 0.75   CT CAP 09/25/2023: Few tiny lung nodules 5 mm in size unchanged.  No evidence of metastatic disease. Bone scan 09/20/2023: No evidence of metastatic disease. Mammogram 05/25/2023: Benign breast density category B  CT CAP 03/28/2024: Tiny pulmonary nodules unchanged most likely benign and incidental.   Recommendation:  Continue Signatera testing and monitoring.  Every 3 months Current treatment: Anastrozole  started May 2025 Anastrozole  toxicities: Continues to have intermittent hot flashes and joint symptoms which were present even at baseline.   Return to clinic in 6 months  We will consider  repeating scans if the Signatera test starts to rise again.   Assessment & Plan Estrogen receptor positive malignant neoplasm of lower-outer quadrant of left breast, status post therapy, on surveillance CT scans show unchanged small lung nodules, likely benign. Signatera test on October 5th showed decreased tumor markers, indicating a positive trend. Continued surveillance necessary. - Continue Signatera test every three months. - Repeat CT scan in six months if Signatera levels increase. - Continue anastrozole  therapy without missing doses.  Hot flashes and nausea secondary to anastrozole  therapy Hot flashes manageable, more challenging in summer. Nausea likely related to anastrozole . No significant weight loss. - Continue current anastrozole  therapy. - Encouraged regular exercise, such as brisk walking for 30 minutes daily, to manage symptoms.  Gastroesophageal reflux disease without esophagitis Occasional nausea attributed to acid reflux, managed with current medication. - Continue current medication regimen for acid reflux.      No orders of the defined types were placed in this encounter.  The patient has a good understanding of the overall plan. she agrees with it. she will call with any problems that may develop before the next visit here.  I personally spent a total of 30 minutes in the care of the patient today including preparing to see the patient, getting/reviewing separately obtained history, performing a medically appropriate exam/evaluation, counseling and educating, placing orders, referring and communicating with other health care professionals, documenting clinical information in the EHR, independently interpreting results, communicating results, and coordinating care.   Viinay K Brittanny Levenhagen, MD 04/04/24

## 2024-04-23 ENCOUNTER — Telehealth: Payer: Self-pay | Admitting: Pharmacist

## 2024-04-23 DIAGNOSIS — Z79899 Other long term (current) drug therapy: Secondary | ICD-10-CM

## 2024-04-23 NOTE — Progress Notes (Addendum)
   04/23/2024  Patient ID: Nicole Nunez, female   DOB: Jan 09, 1946, 78 y.o.   MRN: [id number]  Patient was called to follow up on the [employer] application that was completed on her behalf.  Unfortunately, she did not answer her phone. HIPAA compliant message was left on her voicemail.   ADDENDUM--Patient called me back. HIPAA identifiers were obtained. She said she has not received a letter from [employer].  She also said she has a new insurance but she did not know what the name of it is. She said she has been stacking her mail and has not gone through it but that she will and get back to me.  Plan: Call Patient back in a few weeks if I do not hear back from her.   Cassius DOROTHA Brought, PharmD, BCACP Clinical Pharmacist [phone]

## 2024-04-24 ENCOUNTER — Encounter: Payer: Self-pay | Admitting: Pharmacist

## 2024-04-24 NOTE — Progress Notes (Signed)
   04/24/2024  Patient ID: Nicole Nunez, female   DOB: May 29, 1945, 78 y.o.   MRN: [id number]  Obtained Provider signature and faxed back completed application to Luke Mall, CPhT for processing and scanning.  Lab Results  Component Value Date   HGBA1C 7.0 (H) 03/27/2024   HGBA1C 7.1 (H) 11/30/2023   HGBA1C 7.3 (H) 07/25/2023     Cassius DOROTHA Brought, PharmD, BCACP Clinical Pharmacist [phone]

## 2024-04-26 NOTE — Telephone Encounter (Signed)
 Received provider portion PAP application [employer]iga  (AZ&ME).

## 2024-05-03 ENCOUNTER — Other Ambulatory Visit: Payer: Self-pay | Admitting: Adult Health

## 2024-05-03 DIAGNOSIS — Z853 Personal history of malignant neoplasm of breast: Secondary | ICD-10-CM

## 2024-05-13 ENCOUNTER — Ambulatory Visit: Payer: Self-pay | Attending: Surgery

## 2024-05-13 VITALS — Wt 145.2 lb

## 2024-05-13 DIAGNOSIS — Z483 Aftercare following surgery for neoplasm: Secondary | ICD-10-CM | POA: Insufficient documentation

## 2024-05-13 NOTE — Therapy (Signed)
 " OUTPATIENT PHYSICAL THERAPY SOZO SCREENING NOTE   Patient Name: Nicole Nunez MRN: [id number] DOB:25-Feb-1946, 78 y.o., female Today's Date: 05/13/2024  PCP: Jarold Medici, MD REFERRING PROVIDER: Vanderbilt Ned, MD   PT End of Session - 05/13/24 0850     Visit Number 1   # unchanged due to screen only   PT Start Time 0848    PT Stop Time 0852    PT Time Calculation (min) 4 min    Activity Tolerance Patient tolerated treatment well    Behavior During Therapy [facility] for tasks assessed/performed          Past Medical History:  Diagnosis Date   Arthritis    Breast cancer (HCC) 05/18/2022   CKD stage 3 due to type 2 diabetes mellitus (HCC)    DM (diabetes mellitus) (HCC)    GERD (gastroesophageal reflux disease)    High cholesterol    HTN (hypertension)    Past Surgical History:  Procedure Laterality Date   BREAST BIOPSY Left 05/18/2022   US  LT BREAST BX W LOC DEV 1ST LESION IMG BX SPEC US  GUIDE 05/18/2022 [facility] MAMMOGRAP[facility]   BREAST BIOPSY Left 06/20/2022   US  LT RADIOACTIVE SEED LOC 06/20/2022 [facility] MAMMOGRAPHY   BREAST LUMPECTOMY WITH RADIOACTIVE SEED AND AXILLARY LYMPH NODE DISSECTION Left 06/21/2022   Procedure: LEFT BREAST LUMPECTOMY WITH RADIOACTIVE SEED AND AXILLARY NODE DISSECTION;  Surgeon: Vanderbilt Ned, MD;  Location: [facility];  Service: General;  Laterality: Left;   CATARACT EXTRACTION, BILATERAL  01/2018   first was 12/2017   corn removal Bilateral 06/2017   EYE SURGERY     PORT-A-CATH REMOVAL N/A 11/01/2022   Procedure: REMOVAL PORT-A-CATH;  Surgeon: Vanderbilt Ned, MD;  Location: [facility];  Service: General;  Laterality: N/A;   PORTACATH PLACEMENT N/A 07/21/2022   Procedure: INSERTION PORT-A-CATH;  Surgeon: Vanderbilt Ned, MD;  Location: [facility] OR;  Service: General;  Laterality: N/A;  90 MIN ROOM 4-OK'D PER KELLY   SENTINEL NODE BIOPSY Left 07/21/2022   Procedure: LEFT SENTINEL NODE BIOPSY;  Surgeon: Vanderbilt Ned, MD;   Location: [facility] OR;  Service: General;  Laterality: Left;   Patient Active Problem List   Diagnosis Date Noted   Mild nonproliferative diabetic retinopathy of right eye with macular edema associated with type 2 diabetes mellitus (HCC) 03/27/2024   Hypertensive heart and renal disease 03/27/2024   Coronary artery calcification of native artery 12/10/2023   Palpitations 12/10/2023   Gastroesophageal reflux disease without esophagitis 12/10/2023   Encounter for general adult medical examination w/o abnormal findings 03/22/2023   Mild nonproliferative diabetic retinopathy of both eyes associated with type 2 diabetes mellitus (HCC) 03/22/2023   Port-A-Cath in place 08/16/2022   Malignant neoplasm of lower-outer quadrant of left breast of female, estrogen receptor positive (HCC) 05/31/2022   Statin myopathy 02/04/2021   Notalgia 01/29/2020   Vitamin D  deficiency disease 01/29/2020   Class 1 obesity due to excess calories with serious comorbidity and body mass index (BMI) of 30.0 to 30.9 in adult 01/29/2020   Type 2 diabetes mellitus with stage 3 chronic kidney disease, without long-term current use of insulin (HCC) 05/24/2018   Chronic renal disease, stage III (HCC) 05/24/2018   Parenchymal renal hypertension 05/24/2018   Pure hypercholesterolemia 05/24/2018   Class 1 obesity due to excess calories with serious comorbidity and body mass index (BMI) of 31.0 to 31.9 in adult 05/24/2018    REFERRING DIAG: left breast cancer at risk for lymphedema  THERAPY DIAG:  Aftercare following surgery for neoplasm  PERTINENT HISTORY: Patient was diagnosed left IDC. It measures 9mm and is located in the upper outer quadrant. It is ER/PR positive with a Ki67 of 50%.  lumpectomy and SLNB on 06/21/22 but with no lymph nodes revealed in tissue. Will be having chemotherapy TC followed by radiation as well as sentinel lymph node mapping.   PRECAUTIONS: left UE Lymphedema risk, None  SUBJECTIVE: Pt returns for her  last 3 month L-Dex screen.   PAIN:  Are you having pain? No  SOZO SCREENING: Patient was assessed today using the SOZO machine to determine the lymphedema index score. This was compared to her baseline score. It was determined that she is within the recommended range when compared to her baseline and no further action is needed at this time. She will continue SOZO screenings. These are done every 3 months for 2 years post operatively followed by every 6 months for 2 years, and then annually.   L-DEX FLOWSHEETS - 05/13/24 0800       L-DEX LYMPHEDEMA SCREENING   Measurement Type Unilateral    L-DEX MEASUREMENT EXTREMITY Upper Extremity    POSITION  Standing    DOMINANT SIDE Right    At Risk Side Left    BASELINE SCORE (UNILATERAL) 1.2    L-DEX SCORE (UNILATERAL) -1.5    VALUE CHANGE (UNILAT) -2.7          P: Begin every 6 months SOZO screens until 05/2026.   Nicole Nunez, PTA 05/13/2024, 8:51 AM     "

## 2024-05-22 NOTE — Telephone Encounter (Signed)
 Reached out to patient regarding PAP application Farxiga  (AZ&ME) and left HIPAA compliant v/m for any assistance needed to return application.

## 2024-05-24 ENCOUNTER — Ambulatory Visit
Admission: EM | Admit: 2024-05-24 | Discharge: 2024-05-24 | Disposition: A | Discharge status: Home / Self Care | Attending: Emergency Medicine | Admitting: Emergency Medicine

## 2024-05-24 ENCOUNTER — Encounter: Payer: Self-pay | Admitting: Emergency Medicine

## 2024-05-24 ENCOUNTER — Ambulatory Visit (INDEPENDENT_AMBULATORY_CARE_PROVIDER_SITE_OTHER)

## 2024-05-24 ENCOUNTER — Telehealth: Payer: Self-pay | Admitting: Emergency Medicine

## 2024-05-24 DIAGNOSIS — M25562 Pain in left knee: Secondary | ICD-10-CM | POA: Diagnosis not present

## 2024-05-24 MED ORDER — CELECOXIB 100 MG PO CAPS: 100.0000 mg | ORAL_CAPSULE | Freq: Every day | ORAL | 0 refills | Status: DC

## 2024-05-24 MED ORDER — KETOROLAC TROMETHAMINE 30 MG/ML IJ SOLN
30.0000 mg | Freq: Once | INTRAMUSCULAR | Status: AC
  Administered 2024-05-24: 30 mg via INTRAMUSCULAR

## 2024-05-24 NOTE — Telephone Encounter (Signed)
 Attempted to call patient x 1, left voicemail to return call to clinic, returned call to clinic and discussed x-ray results, 2 patient identifiers used to confirm identity,  patella fracture noted advised orthopedic follow-up

## 2024-05-24 NOTE — ED Triage Notes (Signed)
 Patient reports that she hit her foot on box in the garage which caused her to fall and hit left knee on concrete floor on 05/14/24. Patient took Tylenol  for pain with mild relief. Rates pain 5/10.

## 2024-05-24 NOTE — ED Provider Notes (Signed)
 " Nicole Nunez    CSN: [id number] Arrival date & time: 05/24/24  1007      History   Chief Complaint Chief Complaint  Patient presents with   Knee Injury    HPI Nicole Nunez is a 79 y.o. female.   Patient presents for evaluation of left-sided knee pain beginning 10 days ago after injury, hit her foot against the box causing her to fall forward left knee colliding with the concrete for.  Pain has been present to the anterior knee into the anterior left thigh with intermittent posterior knee pain only when the leg is extended.  Painful to bear weight.  Possible swelling but unsure.  Denies presence of numbness or tingling.  Has attempted use of ice, heat, Tylenol  arthritis and IcyHot.    Past Medical History:  Diagnosis Date   Arthritis    Breast cancer (HCC) 05/18/2022   CKD stage 3 due to type 2 diabetes mellitus (HCC)    DM (diabetes mellitus) (HCC)    GERD (gastroesophageal reflux disease)    High cholesterol    HTN (hypertension)     Patient Active Problem List   Diagnosis Date Noted   Mild nonproliferative diabetic retinopathy of right eye with macular edema associated with type 2 diabetes mellitus (HCC) 03/27/2024   Hypertensive heart and renal disease 03/27/2024   Coronary artery calcification of native artery 12/10/2023   Palpitations 12/10/2023   Gastroesophageal reflux disease without esophagitis 12/10/2023   Encounter for general adult medical examination w/o abnormal findings 03/22/2023   Mild nonproliferative diabetic retinopathy of both eyes associated with type 2 diabetes mellitus (HCC) 03/22/2023   Port-A-Cath in place 08/16/2022   Malignant neoplasm of lower-outer quadrant of left breast of female, estrogen receptor positive (HCC) 05/31/2022   Statin myopathy 02/04/2021   Notalgia 01/29/2020   Vitamin D  deficiency disease 01/29/2020   Class 1 obesity due to excess calories with serious comorbidity and body mass index (BMI) of 30.0 to 30.9 in  adult 01/29/2020   Type 2 diabetes mellitus with stage 3 chronic kidney disease, without long-term current use of insulin (HCC) 05/24/2018   Chronic renal disease, stage III (HCC) 05/24/2018   Parenchymal renal hypertension 05/24/2018   Pure hypercholesterolemia 05/24/2018   Class 1 obesity due to excess calories with serious comorbidity and body mass index (BMI) of 31.0 to 31.9 in adult 05/24/2018    Past Surgical History:  Procedure Laterality Date   BREAST BIOPSY Left 05/18/2022   US  LT BREAST BX W LOC DEV 1ST LESION IMG BX SPEC US  GUIDE 05/18/2022 [facility]   BREAST BIOPSY Left 06/20/2022   US  LT RADIOACTIVE SEED LOC 06/20/2022 [facility]   BREAST LUMPECTOMY WITH RADIOACTIVE SEED AND AXILLARY LYMPH NODE DISSECTION Left 06/21/2022   Procedure: LEFT BREAST LUMPECTOMY WITH RADIOACTIVE SEED AND AXILLARY NODE DISSECTION;  Surgeon: Nicole Ned, MD;  Location: [facility];  Service: General;  Laterality: Left;   CATARACT EXTRACTION, BILATERAL  01/2018   first was 12/2017   corn removal Bilateral 06/2017   EYE SURGERY     PORT-A-CATH REMOVAL N/A 11/01/2022   Procedure: REMOVAL PORT-A-CATH;  Surgeon: Nicole Ned, MD;  Location: [facility];  Service: General;  Laterality: N/A;   PORTACATH PLACEMENT N/A 07/21/2022   Procedure: INSERTION PORT-A-CATH;  Surgeon: Nicole Ned, MD;  Location: [facility];  Service: General;  Laterality: N/A;  90 MIN ROOM 4-OK'D PER KELLY   SENTINEL NODE BIOPSY Left 07/21/2022   Procedure: LEFT  SENTINEL NODE BIOPSY;  Surgeon: Nicole Ned, MD;  Location: [facility];  Service: General;  Laterality: Left;    OB History   No obstetric history on file.      Home Medications    Prior to Admission medications  Medication Sig Start Date End Date Taking? Authorizing Provider  anastrozole  (ARIMIDEX ) 1 MG tablet TAKE 1 TABLET BY MOUTH EVERY DAY 02/02/24   Gudena, Vinay, MD  Ascorbic Acid (VITAMIN C) 1000 MG tablet Take 1,000  mg by mouth daily.    [provider]  aspirin EC 81 MG tablet Take 81 mg by mouth daily.    [provider]  b complex vitamins tablet Take 1 tablet by mouth daily.    [provider]  Calcium  Carbonate (CALCIUM  600 PO) Take 600 mg by mouth daily.    [provider]  Cholecalciferol (VITAMIN D3) 125 MCG (5000 UT) CAPS Take 5,000 Units by mouth daily.    [provider]  dapagliflozin  propanediol (FARXIGA ) 10 MG TABS tablet Take 1 tablet (10 mg total) by mouth daily. 12/05/23   Jarold Medici, MD  Lancet Devices (ONE TOUCH DELICA LANCING DEV) MISC USE AS DIRECTED TO CHECK BLOOD SUGARS ONCE DAILY dx: e11.22 02/07/23   Jarold Medici, MD  Lancets [facility] DELICA PLUS [place]) MISC USE DAILY AS DIRECTED 10/17/23   Jarold Medici, MD  loratadine  (CLARITIN ) 10 MG tablet Take 1 tablet (10 mg total) by mouth daily. 07/25/23   Jarold Medici, MD  magnesium  gluconate (MAGONATE) 500 MG tablet Take 500 mg by mouth daily.    [provider]  Multiple Vitamin (MULTIVITAMIN) tablet Take 1 tablet by mouth daily.    [provider]  olmesartan  (BENICAR ) 20 MG tablet TAKE 1 TABLET BY MOUTH DAILY 11/10/23   Jarold Medici, MD  [facility] VERIO test strip USE TO CHECK BLOOD SUGAR DAILY 11/27/23   Jarold Medici, MD  pantoprazole  (PROTONIX ) 40 MG tablet TAKE 1 TABLET BY MOUTH DAILY 08/21/23   Jarold Medici, MD  Pitavastatin  Magnesium  (ZYPITAMAG ) 4 MG TABS TAKE 1 TABLET BY MOUTH EVERY DAY 03/18/24   Jarold Medici, MD  Semaglutide ,0.25 or 0.5MG /DOS, (OZEMPIC , 0.25 OR 0.5 MG/DOSE,) 2 MG/3ML SOPN INJECT 0.5MG  SUBCUTANEOUSLY WEEKLY. 08/08/23   Jarold Medici, MD  zinc gluconate 50 MG tablet Take 50 mg by mouth daily.    [provider]    Family History Family History  Problem Relation Age of Onset   Asthma Mother    Hypertension Mother    Alzheimer's disease Mother    Early death Father    Asthma Sister    Diabetes Sister    Heart disease Sister     Hypertension Sister     Social History Social History[1]   Allergies   Statins   Review of Systems Review of Systems   Physical Exam Triage Vital Signs ED Triage Vitals  Encounter Vitals Group     BP 05/24/24 1249 138/77     Girls Systolic BP Percentile --      Girls Diastolic BP Percentile --      Boys Systolic BP Percentile --      Boys Diastolic BP Percentile --      Pulse Rate 05/24/24 1249 90     Resp 05/24/24 1249 18     Temp 05/24/24 1249 98.4 F (36.9 C)     Temp Source 05/24/24 1249 Oral     SpO2 05/24/24 1249 100 %     Weight --  Height --      Head Circumference --      Peak Flow --      Pain Score 05/24/24 1252 5     Pain Loc --      Pain Education --      Exclude from Growth Chart --    No data found.  Updated Vital Signs BP 138/77 (BP Location: Right Arm)   Pulse 90   Temp 98.4 F (36.9 C) (Oral)   Resp 18   SpO2 100%   Visual Acuity Right Eye Distance:   Left Eye Distance:   Bilateral Distance:    Right Eye Near:   Left Eye Near:    Bilateral Near:     Physical Exam Constitutional:      Appearance: Normal appearance.  Eyes:     Extraocular Movements: Extraocular movements intact.  Pulmonary:     Effort: Pulmonary effort is normal.  Musculoskeletal:     Comments: Tenderness present to the anterior left knee with mild swelling, no ecchymosis or deformity noted, able to bear weight and complete full range of motion with pain is elicited with extension, 2+ popliteal pulse, no ligament laxity present  Neurological:     Mental Status: She is alert and oriented to person, place, and time.      UC Treatments / Results  Labs (all labs ordered are listed, but only abnormal results are displayed) Labs Reviewed - No data to display  EKG   Radiology No results found.  Procedures Procedures (including critical care time)  Medications Ordered in UC Medications - No data to display  Initial Impression / Assessment and  Plan / UC Course  I have reviewed the triage vital signs and the nursing notes.  Pertinent labs & imaging results that were available during my care of the patient were reviewed by me and considered in my medical decision making (see chart for details).  Acute pain of left knee  X-rays pending will notify of results via telephone, knee brace, applied for stability and support, will wear at all times if injury noted, Toradol IM given and prescribed Celebrex, deferring use of steroids due to history of diabetes type 2, recommended supportive care through [facility], heat massage with activity as tolerated, walker referral given to orthopedics if injury is noted or if no improvement seen with supportive treatment Final Clinical Impressions(s) / UC Diagnoses   Final diagnoses:  Acute pain of left knee     Discharge Instructions      Today you are evaluated for your knee pain  X-ray is pending and you will be notified of results via telephone  Knee compression has been applied for stability and support, please use when completing activity  If there is no injury within the bone you may use as needed however if injury is present please wear at all times  If there is any injury present to the knee you will need to follow-up with orthopedics for further evaluation and management, information listed on front page  You may apply ice or heat over the affected area in 10 to 15-minute intervals  Cushion with pillows whenever sitting and lying for support     ED Prescriptions   None    PDMP not reviewed this encounter.     [1]  Social History Tobacco Use   Smoking status: Never   Smokeless tobacco: Never  Vaping Use   Vaping status: Never Used  Substance Use Topics   Alcohol use: Never  Drug use: Never     Teresa Shelba SAUNDERS, NP 05/24/24 1330  "

## 2024-05-24 NOTE — Discharge Instructions (Addendum)
 Today you are evaluated for your knee pain  X-ray is pending and you will be notified of results via telephone  Knee compression has been applied for stability and support, please use when completing activity  If there is no injury within the bone you may use as needed however if injury is present please wear at all times  If there is any injury present to the knee you will need to follow-up with orthopedics for further evaluation and management, information listed on front page  You have been given an injection of Toradol to help reduce inflammation and help with pain and ideally will start to see some improvement within the next hour  Starting tomorrow take Celebrex once daily for 3 to 4 days consistently then you may use as needed to continue the above process, may take Tylenol  or use any topical medicines in addition to this   You may apply ice or heat over the affected area in 10 to 15-minute intervals  Cushion with pillows whenever sitting and lying for support

## 2024-05-27 ENCOUNTER — Ambulatory Visit (HOSPITAL_COMMUNITY): Payer: Self-pay

## 2024-05-27 ENCOUNTER — Ambulatory Visit
Admission: RE | Admit: 2024-05-27 | Discharge: 2024-05-27 | Disposition: A | Discharge status: Home / Self Care | Source: Ambulatory Visit | Attending: Adult Health | Admitting: Adult Health

## 2024-05-27 DIAGNOSIS — Z853 Personal history of malignant neoplasm of breast: Secondary | ICD-10-CM

## 2024-05-28 ENCOUNTER — Ambulatory Visit

## 2024-05-29 ENCOUNTER — Ambulatory Visit

## 2024-05-29 ENCOUNTER — Telehealth: Payer: Self-pay | Admitting: Pharmacist

## 2024-05-29 VITALS — BP 125/77 | HR 89 | Ht <= 58 in | Wt 145.2 lb

## 2024-05-29 DIAGNOSIS — S82142A Displaced bicondylar fracture of left tibia, initial encounter for closed fracture: Secondary | ICD-10-CM

## 2024-05-29 DIAGNOSIS — M25562 Pain in left knee: Secondary | ICD-10-CM | POA: Diagnosis not present

## 2024-05-29 DIAGNOSIS — M79662 Pain in left lower leg: Secondary | ICD-10-CM

## 2024-05-29 DIAGNOSIS — S82035A Nondisplaced transverse fracture of left patella, initial encounter for closed fracture: Secondary | ICD-10-CM

## 2024-05-29 DIAGNOSIS — E1122 Type 2 diabetes mellitus with diabetic chronic kidney disease: Secondary | ICD-10-CM

## 2024-05-29 MED ORDER — WALKER MISC: 1.0000 [IU] | 0 refills | Status: AC

## 2024-05-29 NOTE — Progress Notes (Addendum)
" ° °  05/29/2024 Name: Nicole Nunez MRN: [id number] DOB: May 17, 1946  Chief Complaint  Patient presents with   Medication Assistance    Farxiga    Patient was called regarding medication assistance. HIPAA compliant message was left on her voicemail. She was sent an application for Farxiga  but it has not been returned.  In addition, Ozempic  is no longer available through Patient Assistance [employer].  Need to establish where the Patient would like to have Ozempic  filled and discuss copayments.  Lab Results  Component Value Date   HGBA1C 7.0 (H) 03/27/2024   HGBA1C 7.1 (H) 11/30/2023   HGBA1C 7.3 (H) 07/25/2023    Farxiga  10 mg daily Ozempic  0.5 mg weekly   Lipid Panel     Component Value Date/Time   CHOL 161 03/27/2024 1000   TRIG 66 03/27/2024 1000   HDL 68 03/27/2024 1000   CHOLHDL 2.4 03/27/2024 1000   LDLCALC 80 03/27/2024 1000   LABVLDL 13 03/27/2024 1000   On Pitavastatin  magnesium   4 mg- gets this filled cash      05/29/2024    1:59 PM 05/24/2024   12:49 PM 05/13/2024    8:49 AM  Vitals with BMI  Height 4' 10    Weight 145 lbs 3 oz  145 lbs 4 oz  BMI 30.35    Systolic 125 138   Diastolic 77 77   Pulse 89 90       On ARB: Olmesartan  20 mg daily last filled 02/23/24 #100   Plan: Call Patient back in 2 weeks.   Cassius DOROTHA Brought, PharmD, BCACP Clinical Pharmacist [phone]   "

## 2024-05-29 NOTE — Progress Notes (Signed)
 "  Office Visit Note   Patient: Nicole Nunez           Date of Birth: 10-Aug-1945           MRN: [id number] Visit Date: 05/29/2024              Requested by: Jarold Medici, MD [address] [place],  [place] [address] PCP: Jarold Medici, MD   Assessment & Plan: Visit Diagnoses:  1. Tibial plateau fracture, left, closed, initial encounter   2. Acute pain of left knee   3. Nondisplaced transverse fracture of left patella, initial encounter for closed fracture     Plan: Natural history and expected course discussed. Questions answered. Discussed with patient that these fractures are amenable to nonoperative management as they remain nondisplaced despite her weightbearing for the last 2 weeks. Recommend hinged knee brace locked in extension and NWB. Will follow up in 2 weeks for reevaluation.  Orders:  Orders Placed This Encounter  Procedures   DG Tibia/Fibula Left   DG Knee 1-2 Views Left     Subjective: Chief Complaint: Left knee pain  HPI Patient is a 79 y.o. year old female who presents with a knee injury involving the  left knee. Onset of the symptoms was 2 weeks ago. Inciting event: injured during a fall while moving boxes; fell directly onto front of left knee. Current symptoms include pain located in the left knee. Pain is aggravated by walking.  Patient has had no prior history of knee problems.. Treatment to date: none.  Objective: Vital Signs: BP 125/77 (Cuff Size: Normal)   Pulse 89   Ht 4' 10 (1.473 m)   Wt 145 lb 3.2 oz (65.9 kg)   BMI 30.35 kg/m   Physical Exam Gen: Alert, No Acute Distress left knee: Skin intact, no erythema or induration noted. Diffuse proximal tibia tenderness to palpation; no tenderness to palpation of the patella. Range of motion deferred.  Imaging: Radiographs of the left knee and tib/fib personally reviewed by me; reveal nondisplaced transverse fracture of the distal pole of the patella as well as minimally  displaced intercondylar tibial plateau fracture with extension into the medial metaphysis   PMFS History: Patient Active Problem List   Diagnosis Date Noted   Mild nonproliferative diabetic retinopathy of right eye with macular edema associated with type 2 diabetes mellitus (HCC) 03/27/2024   Hypertensive heart and renal disease 03/27/2024   Coronary artery calcification of native artery 12/10/2023   Palpitations 12/10/2023   Gastroesophageal reflux disease without esophagitis 12/10/2023   Encounter for general adult medical examination w/o abnormal findings 03/22/2023   Mild nonproliferative diabetic retinopathy of both eyes associated with type 2 diabetes mellitus (HCC) 03/22/2023   Port-A-Cath in place 08/16/2022   Malignant neoplasm of lower-outer quadrant of left breast of female, estrogen receptor positive (HCC) 05/31/2022   Statin myopathy 02/04/2021   Notalgia 01/29/2020   Vitamin D  deficiency disease 01/29/2020   Class 1 obesity due to excess calories with serious comorbidity and body mass index (BMI) of 30.0 to 30.9 in adult 01/29/2020   Type 2 diabetes mellitus with stage 3 chronic kidney disease, without long-term current use of insulin (HCC) 05/24/2018   Chronic renal disease, stage III (HCC) 05/24/2018   Parenchymal renal hypertension 05/24/2018   Pure hypercholesterolemia 05/24/2018   Class 1 obesity due to excess calories with serious comorbidity and body mass index (BMI) of 31.0 to 31.9 in adult 05/24/2018   Past Medical History:  Diagnosis  Date   Arthritis    Breast cancer (HCC) 05/18/2022   CKD stage 3 due to type 2 diabetes mellitus (HCC)    DM (diabetes mellitus) (HCC)    GERD (gastroesophageal reflux disease)    High cholesterol    HTN (hypertension)     Family History  Problem Relation Age of Onset   Asthma Mother    Hypertension Mother    Alzheimer's disease Mother    Early death Father    Asthma Sister    Diabetes Sister    Heart disease Sister     Hypertension Sister     Past Surgical History:  Procedure Laterality Date   BREAST BIOPSY Left 05/18/2022   US  LT BREAST BX W LOC DEV 1ST LESION IMG BX SPEC US  GUIDE 05/18/2022 [facility]   BREAST BIOPSY Left 06/20/2022   US  LT RADIOACTIVE SEED LOC 06/20/2022 [facility]   BREAST LUMPECTOMY WITH RADIOACTIVE SEED AND AXILLARY LYMPH NODE DISSECTION Left 06/21/2022   Procedure: LEFT BREAST LUMPECTOMY WITH RADIOACTIVE SEED AND AXILLARY NODE DISSECTION;  Surgeon: Vanderbilt Ned, MD;  Location: [facility];  Service: General;  Laterality: Left;   CATARACT EXTRACTION, BILATERAL  01/2018   first was 12/2017   corn removal Bilateral 06/2017   EYE SURGERY     PORT-A-CATH REMOVAL N/A 11/01/2022   Procedure: REMOVAL PORT-A-CATH;  Surgeon: Vanderbilt Ned, MD;  Location: [facility];  Service: General;  Laterality: N/A;   PORTACATH PLACEMENT N/A 07/21/2022   Procedure: INSERTION PORT-A-CATH;  Surgeon: Vanderbilt Ned, MD;  Location: [facility];  Service: General;  Laterality: N/A;  90 MIN ROOM 4-OK'D PER KELLY   SENTINEL NODE BIOPSY Left 07/21/2022   Procedure: LEFT SENTINEL NODE BIOPSY;  Surgeon: Vanderbilt Ned, MD;  Location: [facility];  Service: General;  Laterality: Left;   Social History   Occupational History   Not on file  Tobacco Use   Smoking status: Never   Smokeless tobacco: Never  Vaping Use   Vaping status: Never Used  Substance and Sexual Activity   Alcohol use: Never   Drug use: Never   Sexual activity: Yes   Current Outpatient Medications  Medication Instructions   anastrozole  (ARIMIDEX ) 1 mg, Oral, Daily   aspirin EC 81 mg, Daily   b complex vitamins tablet 1 tablet, Daily   Calcium  Carbonate (CALCIUM  600 PO) 600 mg, Daily   celecoxib  (CELEBREX ) 100 mg, Oral, Daily   dapagliflozin  propanediol (FARXIGA ) 10 mg, Oral, Daily   Lancet Devices (ONE TOUCH DELICA LANCING DEV) MISC USE AS DIRECTED TO CHECK BLOOD SUGARS ONCE DAILY dx: e11.22    Lancets (ONETOUCH DELICA PLUS LANCET33G) MISC See admin instructions   loratadine  (CLARITIN ) 10 mg, Oral, Daily   magnesium  gluconate (MAGONATE) 500 mg, Daily   Misc. Devices (WALKER) MISC 1 Units, Does not apply, As directed   Multiple Vitamin (MULTIVITAMIN) tablet 1 tablet, Daily   olmesartan  (BENICAR ) 20 mg, Oral, Daily   ONETOUCH VERIO test strip USE TO CHECK BLOOD SUGAR DAILY   pantoprazole  (PROTONIX ) 40 mg, Oral, Daily   Semaglutide ,0.25 or 0.5MG /DOS, (OZEMPIC , 0.25 OR 0.5 MG/DOSE,) 2 MG/3ML SOPN INJECT 0.5MG  SUBCUTANEOUSLY WEEKLY.   vitamin C 1,000 mg, Daily   Vitamin D3 5,000 Units, Daily   zinc gluconate 50 mg, Daily   Zypitamag  4 mg, Oral, Daily   Allergies as of 05/29/2024 - Review Complete 05/24/2024  Allergen Reaction Noted   Statins Other (See Comments) 02/03/2021   "

## 2024-06-02 LAB — SIGNATERA
SIGNATERA MTM READOUT: 0.56 MTM/ml — AB
SIGNATERA TEST RESULT: POSITIVE — AB

## 2024-06-05 ENCOUNTER — Telehealth: Payer: Self-pay

## 2024-06-05 ENCOUNTER — Telehealth: Payer: Self-pay | Admitting: Pharmacist

## 2024-06-05 ENCOUNTER — Other Ambulatory Visit (HOSPITAL_COMMUNITY): Payer: Self-pay

## 2024-06-05 DIAGNOSIS — E1122 Type 2 diabetes mellitus with diabetic chronic kidney disease: Secondary | ICD-10-CM

## 2024-06-05 DIAGNOSIS — I130 Hypertensive heart and chronic kidney disease with heart failure and stage 1 through stage 4 chronic kidney disease, or unspecified chronic kidney disease: Secondary | ICD-10-CM

## 2024-06-05 NOTE — Telephone Encounter (Signed)
 Pharmacy Patient Advocate Encounter   Received notification from Physician's Office that prior authorization for Ozempic  is required/requested.   Insurance verification completed.   The patient is insured through [facility] [employer].   Per test claim: PA required; PA submitted to above mentioned insurance via Latent Key/confirmation #/EOC [id number] Status is pending

## 2024-06-05 NOTE — Telephone Encounter (Signed)
 Opened in error

## 2024-06-05 NOTE — Progress Notes (Signed)
 "  06/05/2024 Name: Nicole Nunez MRN: [id number] DOB: [birth year]  Chief Complaint  Patient presents with   Medication Assistance    Farxiga      Subjective:  Nicole Nunez is a 79 year old female being outreached today about medication assistance with Farxiga  and Ozempic . She has multiple medical conditions including but not limited to:  coronary artery disease, gastroesophageal reflux disease, hypertension, breast cancer, chronic kidney disease state 3, hyperlipidemia, statin myopathy, and type 2 diabetes.  She has been receiving Farxgia through [facility] Patient Assistance Program and Ozempic  through [employer] Patient Assistance Program.    [employer] no longer offers Ozempic  through their Program. The Patient was sent an application for Farxiga  to her home but she has not gone through her mail to know if she received it or not.  In addition, the Patient has switched insurance carriers and is now on [employer].  Care Team: Primary Care Provider: Jarold Medici, MD ; Next Scheduled Visit: 08/10/2024  Medication Access/Adherence  Current Pharmacy:  [facility] - [place], [place] - [address] [facility] [address] [place] [address] [place] [address] Phone: [phone] Fax: [phone]  [facility] [address] [place] - [address] [place] [address] Phone: [phone] Fax: [phone]  [facility] - [place], [place] - [address] [place] [address] Phone: [phone] Fax: [phone]  [facility] - [place], [place] - [address] [place] [address] Phone: [phone] Fax: [phone]  [facility] - [place], [place] - [address] [place] [address] Phone: [phone] Fax: [phone]  [facility] - [facility] [address] [place]  [address] Phone: [phone] Fax: [phone]   Patient reports affordability concerns with their medications: Yes  Was previously getting Ozempic  and Farxiga  through Patient Assistance Programs. Patient reports access/transportation concerns to their pharmacy: No  Patient reports adherence concerns with their medications:  No      Diabetes:  Current medications:     Farxiga  10 mg daily Ozempic  0.5 mg weekly    Patient denies hypoglycemic s/sx including  dizziness, shakiness, sweating. Patient denies hyperglycemic symptoms including  polyuria, polydipsia, polyphagia, nocturia, neuropathy, blurred vision.   Macrovascular and Microvascular Risk Reduction:  Statin? yes (Pitavastatin  4mg  --gets at [facility]  on cash); ACEi/ARB? yes (Olmesartan ) Last urinary albumin/creatinine ratio:  Lab Results  Component Value Date   MICRALBCREAT 3 03/27/2024   MICRALBCREAT <17 03/15/2023   MICRALBCREAT 7 02/17/2022   MICRALBCREAT 30 02/04/2021   MICRALBCREAT <30 01/29/2020   MICRALBCREAT <30 12/20/2018   MICRALBCREAT <30 08/27/2018   Last eye exam:  Lab Results  Component Value Date   HMDIABEYEEXA Retinopathy (A) 11/23/2023   Last foot exam: No foot exam found Tobacco Use:  Tobacco Use: Low Risk (05/24/2024)   Patient History    Smoking Tobacco Use: Never    Smokeless Tobacco Use: Never    Passive Exposure: Not on file     Objective:  Lab Results  Component Value Date   HGBA1C 7.0 (H) 03/27/2024    Lab Results  Component Value Date   CREATININE 1.30 (H) 03/28/2024   BUN 25 03/27/2024   NA 141 03/27/2024   K 4.4 03/27/2024   CL 102 03/27/2024   CO2 25 03/27/2024    Lab Results  Component Value Date   CHOL 161 03/27/2024   HDL 68 03/27/2024   LDLCALC 80 03/27/2024   TRIG 66 03/27/2024   CHOLHDL 2.4 03/27/2024    Medications Reviewed Today     Reviewed by Jolee Cassius PARAS, [facility] (Pharmacist) on 06/05/24 at 1502  Med List Status: <None>   Medication Order Taking?  Sig Documenting Provider Last Dose Status Informant  anastrozole  (ARIMIDEX ) 1 MG tablet [id number] Yes TAKE 1 TABLET BY MOUTH EVERY DAY Gudena, Vinay, MD  Active   Ascorbic Acid (VITAMIN C) 1000 MG tablet [id number] Yes Take 1,000 mg by mouth daily. [provider]  Active Self  aspirin EC 81 MG tablet [id number] Yes Take 81 mg by mouth daily. [provider]  Active Self  b complex vitamins tablet [id number] Yes Take 1 tablet by mouth daily. [provider]  Active Self  Calcium  Carbonate (CALCIUM  600 PO) [id number] Yes Take 600 mg by mouth daily. [provider]  Active Self  celecoxib  (CELEBREX ) 100 MG capsule [id number] Yes Take 1 capsule (100 mg total) by mouth daily. Teresa Shelba SAUNDERS, NP  Active   Cholecalciferol (VITAMIN D3) 125 MCG (5000 UT) CAPS [id number] Yes Take 5,000 Units by mouth daily. [provider]  Active Self  dapagliflozin  propanediol (FARXIGA ) 10 MG TABS tablet [id number] Yes Take 1 tablet (10 mg total) by mouth daily. Jarold Medici, MD  Active            Med Note MACK, CASSIUS PARAS [place] Feb 28, 2024  5:54 PM) [facility] PATIENT ASSISTANCE  Lancet Devices (ONE TOUCH DELICA LANCING DEV) MISC [id number] Yes USE AS DIRECTED TO CHECK BLOOD SUGARS ONCE DAILY dx: e11.22 Jarold Medici, MD  Active   Lancets [facility] CATHRY[place] PLUS [place]) [place] [id number] Yes USE DAILY AS DIRECTED Jarold Medici, MD  Active   loratadine  (CLARITIN ) 10 MG tablet [id number] Yes Take 1 tablet (10 mg total) by mouth daily. Jarold Medici, MD  Active   magnesium  gluconate (MAGONATE) 500 MG tablet [id number] Yes Take 500 mg by mouth daily. [provider]  Active Self  Misc. Devices [place]) MISC [id number] Yes 1 Units by Does not apply route as directed. Steffanie Lukes, PA-C  Active   Multiple Vitamin (MULTIVITAMIN) tablet [id number] Yes Take 1 tablet by mouth daily. [provider]  Active Self  olmesartan  (BENICAR ) 20 MG tablet [id number] Yes TAKE 1  TABLET BY MOUTH DAILY Jarold Medici, MD  Active   [facility] VERIO test strip [id number] Yes USE TO CHECK BLOOD SUGAR DAILY Jarold Medici, MD  Active   pantoprazole  (PROTONIX ) 40 MG tablet [id number] Yes TAKE 1 TABLET BY MOUTH DAILY Jarold Medici, MD  Active   Pitavastatin  Magnesium  (ZYPITAMAG ) 4 MG TABS [id number] Yes TAKE 1 TABLET BY MOUTH EVERY DAY Jarold Medici, MD  Active   Semaglutide ,0.25 or 0.5MG /DOS, (OZEMPIC , 0.25 OR 0.5 MG/DOSE,) 2 MG/3ML SOPN [id number] Yes INJECT 0.5MG  SUBCUTANEOUSLY WEEKLY. Jarold Medici, MD  Active            Med Note MACK, CASSIUS PARAS [place] Jun 05, 2024  1:18 PM) Was previously getting through Patient Assistance Program  zinc gluconate 50 MG tablet [id number] Yes Take 50 mg by mouth daily. [provider]  Active Self              05/29/2024    1:59 PM 05/24/2024   12:49 PM 05/13/2024    8:49 AM  Vitals with BMI  Height 4' 10    Weight  145 lbs 3 oz  145 lbs 4 oz  BMI 30.35    Systolic 125 138   Diastolic 77 77   Pulse 89 90      Assessment/Plan:   Diabetes: - Currently controlled; goal A1c <7%. Cardiorenal risk reduction is optimized.. Blood pressure is at goal <130/80. LDL is at goal.  - Sent message to Inocente Butcher, CPhT to complete a prior authorization for the Patient  Follow Up Plan:   Received message back from Nancy that the prior authorization for Ozempic  was approved. Inocente ran a test claim for Farxiga  and Ozempic  on the CSNP plan through [employer] and both were $0 Patient wants to get her medications through Mail order.  The following medications will be sent to the [facility] [place])  Farxiga  10 mg 1 tablet daily Olmesartan  20 mg 1 tablet daily Ozempic  0.5 mg weekly One Touch Lancets One touch test strips   "

## 2024-06-06 ENCOUNTER — Other Ambulatory Visit (HOSPITAL_COMMUNITY): Payer: Self-pay

## 2024-06-06 ENCOUNTER — Other Ambulatory Visit: Payer: Self-pay

## 2024-06-06 MED ORDER — ACCU-CHEK SOFTCLIX LANCETS MISC
3 refills | Status: AC
  Filled 2024-06-06: qty 100, 100d supply, fill #0

## 2024-06-06 MED ORDER — ACCU-CHEK GUIDE TEST VI STRP
ORAL_STRIP | 2 refills | Status: AC
  Filled 2024-06-06: qty 100, 100d supply, fill #0

## 2024-06-06 MED ORDER — ACCU-CHEK GUIDE W/DEVICE KIT
PACK | Freq: Every day | 0 refills | Status: AC
  Filled 2024-06-06: qty 1, 30d supply, fill #0

## 2024-06-06 MED ORDER — DAPAGLIFLOZIN PROPANEDIOL 10 MG PO TABS
10.0000 mg | ORAL_TABLET | Freq: Every day | ORAL | 3 refills | Status: AC
  Filled 2024-06-06: qty 100, 100d supply, fill #0

## 2024-06-06 MED ORDER — OLMESARTAN MEDOXOMIL 20 MG PO TABS
20.0000 mg | ORAL_TABLET | Freq: Every day | ORAL | 3 refills | Status: AC
  Filled 2024-06-06: qty 100, 100d supply, fill #0

## 2024-06-06 MED ORDER — OZEMPIC (0.25 OR 0.5 MG/DOSE) 2 MG/3ML ~~LOC~~ SOPN
PEN_INJECTOR | SUBCUTANEOUS | 1 refills | Status: AC
  Filled 2024-06-06: qty 9, 84d supply, fill #0

## 2024-06-06 NOTE — Telephone Encounter (Addendum)
 Pharmacy Patient Advocate Encounter  Received notification from [employer] that Prior Authorization for Ozempic  has been APPROVED from 1/14/20206 to 06/05/2025. Ran test claim, Copay is $0. This test claim was processed through [facility]- copay amounts may vary at other pharmacies due to pharmacy/plan contracts, or as the patient moves through the different stages of their insurance plan.   PA #/Case ID/Reference #: [id number]

## 2024-06-11 ENCOUNTER — Other Ambulatory Visit: Payer: Self-pay

## 2024-06-11 ENCOUNTER — Other Ambulatory Visit: Payer: Self-pay | Admitting: Internal Medicine

## 2024-06-11 DIAGNOSIS — C50512 Malignant neoplasm of lower-outer quadrant of left female breast: Secondary | ICD-10-CM

## 2024-06-11 NOTE — Progress Notes (Signed)
 Received signatera results, it came back positive at 0.56 but down from previous result in September of 0.75. November CT CAP was negative but Gudena MD ordered a repeat CT CAP in May. Order placed and pt called and notified and transferred to central scheduling to make appt in time for f/u on May 13. Pt verbalized understanding.

## 2024-06-12 ENCOUNTER — Ambulatory Visit

## 2024-06-12 VITALS — BP 90/60 | HR 93 | Ht <= 58 in | Wt 145.0 lb

## 2024-06-12 DIAGNOSIS — S82142A Displaced bicondylar fracture of left tibia, initial encounter for closed fracture: Secondary | ICD-10-CM

## 2024-06-12 DIAGNOSIS — S82142D Displaced bicondylar fracture of left tibia, subsequent encounter for closed fracture with routine healing: Secondary | ICD-10-CM

## 2024-06-12 DIAGNOSIS — S82035D Nondisplaced transverse fracture of left patella, subsequent encounter for closed fracture with routine healing: Secondary | ICD-10-CM

## 2024-06-12 NOTE — Progress Notes (Signed)
 "  Office Visit Note   Patient: Nicole Nunez           Date of Birth: February 15, 1946           MRN: [id number] Visit Date: 06/12/2024            PCP: Jarold Medici, MD   Assessment & Plan: Visit Diagnoses:  1. Tibial plateau fracture, left, closed, with routine healing, subsequent encounter   2. Nondisplaced transverse fracture of left patella, subsequent encounter for closed fracture with routine healing     Plan: Fracture healing well. Will continue NWB and knee brace. Will follow up in 2 weeks.  Orders:  Orders Placed This Encounter  Procedures   DG Knee 1-2 Views Left     Subjective: Chief Complaint: Left knee pain  HPI Patient is a 79 y.o. year old female who presents for a follow up appointment for the left knee. Patient's symptoms are rapidly improving since last visit.  Current has no symptoms. Has been wearing her brace and using crutches.  Objective: Vital Signs: BP (!) 145/79   Pulse (!) 103   Ht 4' 10 (1.473 m)   Wt 65.8 kg   BMI 30.31 kg/m   Physical Exam Gen: Alert, No Acute Distress left knee: Skin intact, no erythema or induration noted. No tenderness to palpation. Range of motion deferred.    PMFS History: Patient Active Problem List   Diagnosis Date Noted   Mild nonproliferative diabetic retinopathy of right eye with macular edema associated with type 2 diabetes mellitus (HCC) 03/27/2024   Hypertensive heart and renal disease 03/27/2024   Coronary artery calcification of native artery 12/10/2023   Palpitations 12/10/2023   Gastroesophageal reflux disease without esophagitis 12/10/2023   Encounter for general adult medical examination w/o abnormal findings 03/22/2023   Mild nonproliferative diabetic retinopathy of both eyes associated with type 2 diabetes mellitus (HCC) 03/22/2023   Port-A-Cath in place 08/16/2022   Malignant neoplasm of lower-outer quadrant of left breast of female, estrogen receptor positive (HCC) 05/31/2022   Statin  myopathy 02/04/2021   Notalgia 01/29/2020   Vitamin D  deficiency disease 01/29/2020   Class 1 obesity due to excess calories with serious comorbidity and body mass index (BMI) of 30.0 to 30.9 in adult 01/29/2020   Type 2 diabetes mellitus with stage 3 chronic kidney disease, without long-term current use of insulin (HCC) 05/24/2018   Chronic renal disease, stage III (HCC) 05/24/2018   Parenchymal renal hypertension 05/24/2018   Pure hypercholesterolemia 05/24/2018   Class 1 obesity due to excess calories with serious comorbidity and body mass index (BMI) of 31.0 to 31.9 in adult 05/24/2018   Past Medical History:  Diagnosis Date   Arthritis    Breast cancer (HCC) 05/18/2022   CKD stage 3 due to type 2 diabetes mellitus (HCC)    DM (diabetes mellitus) (HCC)    GERD (gastroesophageal reflux disease)    High cholesterol    HTN (hypertension)     Family History  Problem Relation Age of Onset   Asthma Mother    Hypertension Mother    Alzheimer's disease Mother    Early death Father    Asthma Sister    Diabetes Sister    Heart disease Sister    Hypertension Sister     Past Surgical History:  Procedure Laterality Date   BREAST BIOPSY Left 05/18/2022   US  LT BREAST BX W LOC DEV 1ST LESION IMG BX SPEC US  GUIDE 05/18/2022 [facility]  BREAST BIOPSY Left 06/20/2022   US  LT RADIOACTIVE SEED LOC 06/20/2022 [facility]   BREAST LUMPECTOMY WITH RADIOACTIVE SEED AND AXILLARY LYMPH NODE DISSECTION Left 06/21/2022   Procedure: LEFT BREAST LUMPECTOMY WITH RADIOACTIVE SEED AND AXILLARY NODE DISSECTION;  Surgeon: Vanderbilt Ned, MD;  Location: [facility];  Service: General;  Laterality: Left;   CATARACT EXTRACTION, BILATERAL  01/2018   first was 12/2017   corn removal Bilateral 06/2017   EYE SURGERY     PORT-A-CATH REMOVAL N/A 11/01/2022   Procedure: REMOVAL PORT-A-CATH;  Surgeon: Vanderbilt Ned, MD;  Location:  SURGERY CENTER;  Service: General;   Laterality: N/A;   PORTACATH PLACEMENT N/A 07/21/2022   Procedure: INSERTION PORT-A-CATH;  Surgeon: Vanderbilt Ned, MD;  [facility]: [facility];  Service: General;  Laterality: N/A;  90 MIN ROOM 4-OK'D PER KELLY   SENTINEL NODE BIOPSY Left 07/21/2022   Procedure: LEFT SENTINEL NODE BIOPSY;  Surgeon: Vanderbilt Ned, MD;  Location: [facility];  Service: General;  Laterality: Left;   Social History   Occupational History   Not on file  Tobacco Use   Smoking status: Never   Smokeless tobacco: Never  Vaping Use   Vaping status: Never Used  Substance and Sexual Activity   Alcohol use: Never   Drug use: Never   Sexual activity: Yes   Current Outpatient Medications  Medication Instructions   Accu-Chek Softclix Lancets lancets Use to test blood sugar daily as directed   anastrozole  (ARIMIDEX ) 1 mg, Oral, Daily   aspirin EC 81 mg, Daily   b complex vitamins tablet 1 tablet, Daily   Blood Glucose Monitoring Suppl (ACCU-CHEK GUIDE) w/Device KIT Use to check blood sugar daily as directed   Calcium  Carbonate (CALCIUM  600 PO) 600 mg, Daily   dapagliflozin  propanediol (FARXIGA ) 10 mg, Oral, Daily   glucose blood (ACCU-CHEK GUIDE TEST) test strip Use to tesy blood sugar daily as directed   Lancet Devices (ONE TOUCH DELICA LANCING DEV) MISC USE AS DIRECTED TO CHECK BLOOD SUGARS ONCE DAILY dx: e11.22   loratadine  (CLARITIN ) 10 mg, Oral, Daily   magnesium  gluconate (MAGONATE) 500 mg, Daily   Misc. Devices (WALKER) MISC 1 Units, Does not apply, As directed   Multiple Vitamin (MULTIVITAMIN) tablet 1 tablet, Daily   olmesartan  (BENICAR ) 20 mg, Oral, Daily   pantoprazole  (PROTONIX ) 40 mg, Oral, Daily   Semaglutide ,0.25 or 0.5MG /DOS, (OZEMPIC , 0.25 OR 0.5 MG/DOSE,) 2 MG/3ML SOPN INJECT 0.5MG  SUBCUTANEOUSLY WEEKLY.   vitamin C 1,000 mg, Daily   Vitamin D3 5,000 Units, Daily   zinc gluconate 50 mg, Daily   Zypitamag  4 mg, Oral, Daily   Allergies as of 06/12/2024 - Review Complete 05/24/2024  Allergen Reaction  Noted   Statins Other (See Comments) 02/03/2021    "

## 2024-06-13 NOTE — Telephone Encounter (Signed)
 Patient is receiving her Farxiga  through the Pharmacy and PAP no longer needed this year.

## 2024-06-27 ENCOUNTER — Ambulatory Visit

## 2024-06-27 VITALS — BP 126/72 | HR 103 | Ht <= 58 in | Wt 145.0 lb

## 2024-06-27 DIAGNOSIS — S82142D Displaced bicondylar fracture of left tibia, subsequent encounter for closed fracture with routine healing: Secondary | ICD-10-CM

## 2024-06-27 NOTE — Progress Notes (Signed)
 "  Office Visit Note   Patient: Nicole Nunez           Date of Birth: 1945/11/11           MRN: [id number] Visit Date: 06/27/2024            PCP: Jarold Medici, MD   Assessment & Plan: Visit Diagnoses:  1. Tibial plateau fracture, left, closed, with routine healing, subsequent encounter     Plan: Fracture healed clinically and radiographically. May WBAT and given a referral for PT. Will follow up in 6 weeks.  Orders:  Orders Placed This Encounter  Procedures   DG Knee 1-2 Views Left     Subjective: Chief Complaint: Left patella and tibial plateau fracture  HPI Patient is a 79 y.o. year old female who presents for a follow up appointment for the left knee. Patient denies pain in the knee.  Current has no symptoms. Has been wearing her brace and using crutches.  Objective: Vital Signs: BP 126/72   Pulse (!) 103   Ht 4' 10 (1.473 m)   Wt 65.8 kg   BMI 30.31 kg/m   Physical Exam Gen: Alert, No Acute Distress left knee: Skin intact, no erythema or induration noted. No patella or tibial plateau tenderness to palpation. Range of motion 0-90 degrees   PMFS History: Patient Active Problem List   Diagnosis Date Noted   Mild nonproliferative diabetic retinopathy of right eye with macular edema associated with type 2 diabetes mellitus (HCC) 03/27/2024   Hypertensive heart and renal disease 03/27/2024   Coronary artery calcification of native artery 12/10/2023   Palpitations 12/10/2023   Gastroesophageal reflux disease without esophagitis 12/10/2023   Encounter for general adult medical examination w/o abnormal findings 03/22/2023   Mild nonproliferative diabetic retinopathy of both eyes associated with type 2 diabetes mellitus (HCC) 03/22/2023   Port-A-Cath in place 08/16/2022   Malignant neoplasm of lower-outer quadrant of left breast of female, estrogen receptor positive (HCC) 05/31/2022   Statin myopathy 02/04/2021   Notalgia 01/29/2020   Vitamin D  deficiency  disease 01/29/2020   Class 1 obesity due to excess calories with serious comorbidity and body mass index (BMI) of 30.0 to 30.9 in adult 01/29/2020   Type 2 diabetes mellitus with stage 3 chronic kidney disease, without long-term current use of insulin (HCC) 05/24/2018   Chronic renal disease, stage III (HCC) 05/24/2018   Parenchymal renal hypertension 05/24/2018   Pure hypercholesterolemia 05/24/2018   Class 1 obesity due to excess calories with serious comorbidity and body mass index (BMI) of 31.0 to 31.9 in adult 05/24/2018   Past Medical History:  Diagnosis Date   Arthritis    Breast cancer (HCC) 05/18/2022   CKD stage 3 due to type 2 diabetes mellitus (HCC)    DM (diabetes mellitus) (HCC)    GERD (gastroesophageal reflux disease)    High cholesterol    HTN (hypertension)     Family History  Problem Relation Age of Onset   Asthma Mother    Hypertension Mother    Alzheimer's disease Mother    Early death Father    Asthma Sister    Diabetes Sister    Heart disease Sister    Hypertension Sister     Past Surgical History:  Procedure Laterality Date   BREAST BIOPSY Left 05/18/2022   US  LT BREAST BX W LOC DEV 1ST LESION IMG BX SPEC US  GUIDE 05/18/2022 [facility]   BREAST BIOPSY Left 06/20/2022   US  LT RADIOACTIVE  SEED LOC 06/20/2022 [facility]   BREAST LUMPECTOMY WITH RADIOACTIVE SEED AND AXILLARY LYMPH NODE DISSECTION Left 06/21/2022   Procedure: LEFT BREAST LUMPECTOMY WITH RADIOACTIVE SEED AND AXILLARY NODE DISSECTION;  Surgeon: Vanderbilt Ned, MD;  Location: [facility];  Service: General;  Laterality: Left;   CATARACT EXTRACTION, BILATERAL  01/2018   first was 12/2017   corn removal Bilateral 06/2017   EYE SURGERY     PORT-A-CATH REMOVAL N/A 11/01/2022   Procedure: REMOVAL PORT-A-CATH;  Surgeon: Vanderbilt Ned, MD;  Location: [facility];  Service: General;  Laterality: N/A;   PORTACATH PLACEMENT N/A 07/21/2022   Procedure:  INSERTION PORT-A-CATH;  Surgeon: Vanderbilt Ned, MD;  [facility];  Service: General;  Laterality: N/A;  90 MIN ROOM 4-OK'D PER KELLY   SENTINEL NODE BIOPSY Left 07/21/2022   Procedure: LEFT SENTINEL NODE BIOPSY;  Surgeon: Vanderbilt Ned, MD;  [facility];  Service: General;  Laterality: Left;   Social History   Occupational History   Not on file  Tobacco Use   Smoking status: Never   Smokeless tobacco: Never  Vaping Use   Vaping status: Never Used  Substance and Sexual Activity   Alcohol use: Never   Drug use: Never   Sexual activity: Yes   Current Outpatient Medications  Medication Instructions   Accu-Chek Softclix Lancets lancets Use to test blood sugar daily as directed   anastrozole  (ARIMIDEX ) 1 mg, Oral, Daily   aspirin EC 81 mg, Daily   b complex vitamins tablet 1 tablet, Daily   Blood Glucose Monitoring Suppl (ACCU-CHEK GUIDE) w/Device KIT Use to check blood sugar daily as directed   Calcium  Carbonate (CALCIUM  600 PO) 600 mg, Daily   dapagliflozin  propanediol (FARXIGA ) 10 mg, Oral, Daily   glucose blood (ACCU-CHEK GUIDE TEST) test strip Use to tesy blood sugar daily as directed   Lancet Devices (ONE TOUCH DELICA LANCING DEV) MISC USE AS DIRECTED TO CHECK BLOOD SUGARS ONCE DAILY dx: e11.22   loratadine  (CLARITIN ) 10 mg, Oral, Daily   magnesium  gluconate (MAGONATE) 500 mg, Daily   Misc. Devices (WALKER) MISC 1 Units, Does not apply, As directed   Multiple Vitamin (MULTIVITAMIN) tablet 1 tablet, Daily   olmesartan  (BENICAR ) 20 mg, Oral, Daily   pantoprazole  (PROTONIX ) 40 mg, Oral, Daily   Semaglutide ,0.25 or 0.5MG /DOS, (OZEMPIC , 0.25 OR 0.5 MG/DOSE,) 2 MG/3ML SOPN INJECT 0.5MG  SUBCUTANEOUSLY WEEKLY.   vitamin C 1,000 mg, Daily   Vitamin D3 5,000 Units, Daily   zinc gluconate 50 mg, Daily   Zypitamag  4 mg, Oral, Daily   Allergies as of 06/27/2024 - Review Complete 05/24/2024  Allergen Reaction Noted   Statins Other (See Comments) 02/03/2021    "

## 2024-08-08 ENCOUNTER — Ambulatory Visit

## 2024-08-20 ENCOUNTER — Ambulatory Visit: Admitting: Internal Medicine

## 2024-09-20 ENCOUNTER — Other Ambulatory Visit (HOSPITAL_COMMUNITY)

## 2024-10-02 ENCOUNTER — Inpatient Hospital Stay: Admitting: Hematology and Oncology

## 2024-11-11 ENCOUNTER — Ambulatory Visit

## 2025-03-14 ENCOUNTER — Ambulatory Visit: Payer: Self-pay

## 2025-04-07 ENCOUNTER — Encounter: Admitting: Internal Medicine
